# Patient Record
Sex: Male | Born: 1965 | Race: Black or African American | Hispanic: No | Marital: Married | Smoking: Never smoker
Health system: Southern US, Community
[De-identification: ages and names within clinical notes are randomized; demographics above are authoritative.]

## PROBLEM LIST (undated history)

## (undated) DIAGNOSIS — G4730 Sleep apnea, unspecified: Secondary | ICD-10-CM

## (undated) DIAGNOSIS — T7840XA Allergy, unspecified, initial encounter: Secondary | ICD-10-CM

## (undated) DIAGNOSIS — I739 Peripheral vascular disease, unspecified: Secondary | ICD-10-CM

## (undated) DIAGNOSIS — E785 Hyperlipidemia, unspecified: Secondary | ICD-10-CM

## (undated) DIAGNOSIS — I10 Essential (primary) hypertension: Secondary | ICD-10-CM

## (undated) DIAGNOSIS — I509 Heart failure, unspecified: Secondary | ICD-10-CM

## (undated) HISTORY — DX: Sleep apnea, unspecified: G47.30

## (undated) HISTORY — DX: Hyperlipidemia, unspecified: E78.5

## (undated) HISTORY — DX: Morbid (severe) obesity due to excess calories: E66.01

## (undated) HISTORY — PX: TONSILLECTOMY: SUR1361

## (undated) HISTORY — DX: Essential (primary) hypertension: I10

## (undated) HISTORY — DX: Allergy, unspecified, initial encounter: T78.40XA

---

## 2006-12-07 ENCOUNTER — Ambulatory Visit: Payer: Self-pay | Admitting: Internal Medicine

## 2006-12-07 LAB — CONVERTED CEMR LAB
ALT: 30 units/L (ref 0–53)
AST: 25 units/L (ref 0–37)
Albumin: 3.6 g/dL (ref 3.5–5.2)
Alkaline Phosphatase: 91 units/L (ref 39–117)
BUN: 10 mg/dL (ref 6–23)
Basophils Absolute: 0 10*3/uL (ref 0.0–0.1)
Basophils Relative: 0 % (ref 0.0–1.0)
Bilirubin, Direct: 0.1 mg/dL (ref 0.0–0.3)
CO2: 31 meq/L (ref 19–32)
Calcium: 9.6 mg/dL (ref 8.4–10.5)
Chloride: 100 meq/L (ref 96–112)
Cholesterol: 248 mg/dL (ref 0–200)
Creatinine, Ser: 0.8 mg/dL (ref 0.4–1.5)
Creatinine,U: 183 mg/dL
Direct LDL: 162.3 mg/dL
Eosinophils Absolute: 0.2 10*3/uL (ref 0.0–0.6)
Eosinophils Relative: 3 % (ref 0.0–5.0)
GFR calc Af Amer: 138 mL/min
GFR calc non Af Amer: 114 mL/min
Glucose, Bld: 326 mg/dL — ABNORMAL HIGH (ref 70–99)
HCT: 41.9 % (ref 39.0–52.0)
HDL: 44.2 mg/dL (ref >39.0)
Hemoglobin: 14.1 g/dL (ref 13.0–17.0)
Hgb A1c MFr Bld: 13.8 % — ABNORMAL HIGH (ref 4.6–6.0)
Lymphocytes Relative: 40 % (ref 12.0–46.0)
MCHC: 33.7 g/dL (ref 30.0–36.0)
MCV: 83.1 fL (ref 78.0–100.0)
Microalb Creat Ratio: 87.4 mg/g — ABNORMAL HIGH (ref 0.0–30.0)
Microalb, Ur: 16 mg/dL — ABNORMAL HIGH (ref 0.0–1.9)
Monocytes Absolute: 0.4 10*3/uL (ref 0.2–0.7)
Monocytes Relative: 6.3 % (ref 3.0–11.0)
Neutro Abs: 3.5 10*3/uL (ref 1.4–7.7)
Neutrophils Relative %: 50.7 % (ref 43.0–77.0)
Platelets: 262 10*3/uL (ref 150–400)
Potassium: 4.6 meq/L (ref 3.5–5.1)
RBC: 5.04 M/uL (ref 4.22–5.81)
RDW: 12.8 % (ref 11.5–14.6)
Sodium: 138 meq/L (ref 135–145)
TSH: 0.85 microintl units/mL (ref 0.35–5.50)
Total Bilirubin: 0.8 mg/dL (ref 0.3–1.2)
Total CHOL/HDL Ratio: 5.6
Total Protein: 7.9 g/dL (ref 6.0–8.3)
Triglycerides: 224 mg/dL (ref 0–149)
VLDL: 45 mg/dL — ABNORMAL HIGH (ref 0–40)
WBC: 6.8 10*3/uL (ref 4.5–10.5)

## 2006-12-13 ENCOUNTER — Ambulatory Visit: Payer: Self-pay | Admitting: Internal Medicine

## 2006-12-26 ENCOUNTER — Encounter: Payer: Self-pay | Admitting: Endocrinology

## 2006-12-26 ENCOUNTER — Ambulatory Visit: Payer: Self-pay | Admitting: Endocrinology

## 2006-12-26 DIAGNOSIS — I10 Essential (primary) hypertension: Secondary | ICD-10-CM | POA: Insufficient documentation

## 2006-12-26 DIAGNOSIS — E1165 Type 2 diabetes mellitus with hyperglycemia: Secondary | ICD-10-CM | POA: Insufficient documentation

## 2006-12-26 DIAGNOSIS — E785 Hyperlipidemia, unspecified: Secondary | ICD-10-CM | POA: Insufficient documentation

## 2006-12-26 DIAGNOSIS — E118 Type 2 diabetes mellitus with unspecified complications: Secondary | ICD-10-CM | POA: Insufficient documentation

## 2007-01-16 ENCOUNTER — Ambulatory Visit: Payer: Self-pay | Admitting: Internal Medicine

## 2007-01-16 LAB — CONVERTED CEMR LAB
ALT: 25 units/L (ref 0–53)
BUN: 7 mg/dL (ref 6–23)
CO2: 29 meq/L (ref 19–32)
Calcium: 9.3 mg/dL (ref 8.4–10.5)
Chloride: 106 meq/L (ref 96–112)
Cholesterol: 124 mg/dL (ref 0–200)
Creatinine, Ser: 0.8 mg/dL (ref 0.4–1.5)
GFR calc Af Amer: 138 mL/min
GFR calc non Af Amer: 114 mL/min
Glucose, Bld: 125 mg/dL — ABNORMAL HIGH (ref 70–99)
HDL: 37.5 mg/dL — ABNORMAL LOW (ref >39.0)
LDL Cholesterol: 70 mg/dL (ref 0–99)
Potassium: 4.4 meq/L (ref 3.5–5.1)
Sodium: 140 meq/L (ref 135–145)
T3 Uptake Ratio: 44.3 % — ABNORMAL HIGH (ref 22.5–37.0)
Total CHOL/HDL Ratio: 3.3
Triglycerides: 83 mg/dL (ref 0–149)
VLDL: 17 mg/dL (ref 0–40)

## 2007-01-24 ENCOUNTER — Ambulatory Visit: Payer: Self-pay | Admitting: Internal Medicine

## 2007-03-04 ENCOUNTER — Ambulatory Visit: Payer: Self-pay | Admitting: Internal Medicine

## 2007-03-04 LAB — CONVERTED CEMR LAB
Creatinine,U: 173.8 mg/dL
Hgb A1c MFr Bld: 7.4 % — ABNORMAL HIGH (ref 4.6–6.0)
Microalb Creat Ratio: 19.6 mg/g (ref 0.0–30.0)
Microalb, Ur: 3.4 mg/dL — ABNORMAL HIGH (ref 0.0–1.9)

## 2007-03-12 ENCOUNTER — Telehealth: Payer: Self-pay | Admitting: Internal Medicine

## 2007-03-14 ENCOUNTER — Encounter: Payer: Self-pay | Admitting: Internal Medicine

## 2007-03-19 ENCOUNTER — Encounter: Payer: Self-pay | Admitting: Internal Medicine

## 2008-08-14 ENCOUNTER — Ambulatory Visit: Payer: Self-pay | Admitting: Family Medicine

## 2008-11-11 ENCOUNTER — Ambulatory Visit: Payer: Self-pay | Admitting: Family Medicine

## 2008-11-11 DIAGNOSIS — E1169 Type 2 diabetes mellitus with other specified complication: Secondary | ICD-10-CM | POA: Insufficient documentation

## 2008-11-11 DIAGNOSIS — E119 Type 2 diabetes mellitus without complications: Secondary | ICD-10-CM | POA: Insufficient documentation

## 2008-11-11 DIAGNOSIS — R5381 Other malaise: Secondary | ICD-10-CM | POA: Insufficient documentation

## 2008-11-11 DIAGNOSIS — R5383 Other fatigue: Secondary | ICD-10-CM

## 2008-11-11 DIAGNOSIS — E118 Type 2 diabetes mellitus with unspecified complications: Secondary | ICD-10-CM | POA: Insufficient documentation

## 2008-11-11 LAB — CONVERTED CEMR LAB
ALT: 18 units/L (ref 0–53)
AST: 19 units/L (ref 0–37)
Albumin: 3.5 g/dL (ref 3.5–5.2)
Alkaline Phosphatase: 60 units/L (ref 39–117)
BUN: 12 mg/dL (ref 6–23)
Basophils Absolute: 0 10*3/uL (ref 0.0–0.1)
Basophils Relative: 0.6 % (ref 0.0–3.0)
Bilirubin, Direct: 0 mg/dL (ref 0.0–0.3)
CO2: 31 meq/L (ref 19–32)
Calcium: 9.2 mg/dL (ref 8.4–10.5)
Chloride: 105 meq/L (ref 96–112)
Cholesterol: 194 mg/dL (ref 0–200)
Creatinine, Ser: 1 mg/dL (ref 0.4–1.5)
Creatinine,U: 275.9 mg/dL
Eosinophils Absolute: 0.3 10*3/uL (ref 0.0–0.7)
Eosinophils Relative: 3.3 % (ref 0.0–5.0)
GFR calc non Af Amer: 105.64 mL/min (ref >60)
Glucose, Bld: 117 mg/dL — ABNORMAL HIGH (ref 70–99)
HCT: 38.1 % — ABNORMAL LOW (ref 39.0–52.0)
HDL: 42.5 mg/dL (ref >39.00)
Hemoglobin: 12.8 g/dL — ABNORMAL LOW (ref 13.0–17.0)
Hgb A1c MFr Bld: 7.9 % — ABNORMAL HIGH (ref 4.6–6.5)
LDL Cholesterol: 126 mg/dL — ABNORMAL HIGH (ref 0–99)
Lymphocytes Relative: 34.9 % (ref 12.0–46.0)
Lymphs Abs: 2.7 10*3/uL (ref 0.7–4.0)
MCHC: 33.7 g/dL (ref 30.0–36.0)
MCV: 87 fL (ref 78.0–100.0)
Microalb Creat Ratio: 10.5 mg/g (ref 0.0–30.0)
Microalb, Ur: 2.9 mg/dL — ABNORMAL HIGH (ref 0.0–1.9)
Monocytes Absolute: 0.6 10*3/uL (ref 0.1–1.0)
Monocytes Relative: 7.1 % (ref 3.0–12.0)
Neutro Abs: 4.2 10*3/uL (ref 1.4–7.7)
Neutrophils Relative %: 54.1 % (ref 43.0–77.0)
PSA: 0.88 ng/mL (ref 0.10–4.00)
Platelets: 239 10*3/uL (ref 150.0–400.0)
Potassium: 4.7 meq/L (ref 3.5–5.1)
RBC: 4.38 M/uL (ref 4.22–5.81)
RDW: 13.8 % (ref 11.5–14.6)
Sodium: 141 meq/L (ref 135–145)
TSH: 1.39 microintl units/mL (ref 0.35–5.50)
Total Bilirubin: 0.7 mg/dL (ref 0.3–1.2)
Total CHOL/HDL Ratio: 5
Total Protein: 7.7 g/dL (ref 6.0–8.3)
Triglycerides: 129 mg/dL (ref 0.0–149.0)
VLDL: 25.8 mg/dL (ref 0.0–40.0)
WBC: 7.8 10*3/uL (ref 4.5–10.5)

## 2008-11-19 ENCOUNTER — Ambulatory Visit: Payer: Self-pay | Admitting: Family Medicine

## 2008-11-19 LAB — CONVERTED CEMR LAB
Cholesterol, target level: 200 mg/dL
HDL goal, serum: 40 mg/dL
LDL Goal: 100 mg/dL

## 2010-04-13 ENCOUNTER — Ambulatory Visit
Admission: RE | Admit: 2010-04-13 | Discharge: 2010-04-13 | Payer: Self-pay | Source: Home / Self Care | Attending: Family Medicine | Admitting: Family Medicine

## 2010-04-13 DIAGNOSIS — J020 Streptococcal pharyngitis: Secondary | ICD-10-CM | POA: Insufficient documentation

## 2010-04-13 LAB — CONVERTED CEMR LAB: Rapid Strep: POSITIVE

## 2010-04-15 ENCOUNTER — Telehealth (INDEPENDENT_AMBULATORY_CARE_PROVIDER_SITE_OTHER): Payer: Self-pay | Admitting: *Deleted

## 2010-04-18 ENCOUNTER — Encounter (INDEPENDENT_AMBULATORY_CARE_PROVIDER_SITE_OTHER): Payer: Self-pay | Admitting: *Deleted

## 2010-04-18 ENCOUNTER — Telehealth: Payer: Self-pay | Admitting: Family Medicine

## 2010-05-11 NOTE — Miscellaneous (Signed)
Summary: Meformin/Simvastatn/Test Strips  Clinical Lists Changes  Medications: Changed medication from ZOCOR 40 MG  TABS (SIMVASTATIN) one by mouth at bedtime to ZOCOR 40 MG  TABS (SIMVASTATIN) one by mouth at bedtime - Signed Changed medication from GLUCOPHAGE XR 500 MG  TB24 (METFORMIN HCL) two by mouth two times a day to GLUCOPHAGE XR 500 MG  TB24 (METFORMIN HCL) two by mouth two times a day - Signed Changed medication from [facility] ULTRA TEST   STRP (GLUCOSE BLOOD) Test blood sugar three times a day as directed to [facility] ULTRA TEST   STRP (GLUCOSE BLOOD) test blood sugar once daily - Signed Rx of ZOCOR 40 MG  TABS (SIMVASTATIN) one by mouth at bedtime;  #90 x 4;  Signed;  Entered by: Glendell Docker;  Authorized by: Dondra Spry DO;  Method used: Telephoned to [facility], , , [place]  , Ph: , Fax: [phone] Rx of GLUCOPHAGE XR 500 MG  TB24 (METFORMIN HCL) two by mouth two times a day;  #360 x 4;  Signed;  Entered by: Glendell Docker;  Authorized by: Dondra Spry DO;  Method used: Telephoned to [facility], , , [place]  , Ph: , Fax: [phone] Rx of ONETOUCH ULTRA TEST   STRP (GLUCOSE BLOOD) test blood sugar once daily;  #3 x 4;  Signed;  Entered by: Glendell Docker;  Authorized by: Dondra Spry DO;  Method used: Telephoned to [facility], , , [place]  , Ph: , Fax: [phone]    Prescriptions: [place] TEST   STRP (GLUCOSE BLOOD) test blood sugar once daily  #3 x 4   Entered by:   Glendell Docker   Authorized by:   Dondra Spry DO   Signed by:   Glendell Docker on 03/19/2007   Method used:   Telephoned to ...       [facility]             , [place]         Ph:        Fax: [phone]   RxID:   [id number] GLUCOPHAGE XR 500 MG  TB24 (METFORMIN HCL) two by mouth two times a day  #360 x 4   Entered by:   Glendell Docker   Authorized by:   Dondra Spry DO   Signed by:   Glendell Docker on 03/19/2007   Method used:   Telephoned to ...       [facility]             , [place]         Ph:          Fax: [phone]   RxID:   [id number] ZOCOR 40 MG  TABS (SIMVASTATIN) one by mouth at bedtime  #90 x 4   Entered by:   Glendell Docker   Authorized by:   Dondra Spry DO   Signed by:   Glendell Docker on 03/19/2007   Method used:   Telephoned to ...       [facility]             , [place]         Ph:        Fax: [phone]   RxID:   [id number]    Current Allergies: No known allergies

## 2010-05-11 NOTE — Letter (Signed)
Summary: Unable To Reach-Consult Scheduled  [facility]  [address] [place], [place] [address]   Phone: [phone]  Fax: [phone]      03/14/2007     MRN: [id number]    Dear Mr. JANUSZEWSKI,   We have been unable to reach you by phone. This letter is in response to a message given to Dr. Debby Bud on 03/12/2007 regarding test strip authorization. Per Dr. Lewie Loron diabetes control is better. He advised that you should now only check it once daily before breakfast.    If you have any question please call us.      Thank you,    [facility]  Appended Document: Unable To Reach-Consult Scheduled    Clinical Lists Changes

## 2010-05-11 NOTE — Progress Notes (Signed)
Summary: test strip problem  Phone Note Call from Patient Call back at [facility] Phone [phone]   Summary of Call: Pt called, test strips require prior auth. Pt is not on insulin and insurance will not cover more than 1x daily for non insulin pt.  Initial call taken by: Lamar Sprinkles,  March 12, 2007 4:38 PM  Follow-up for Phone Call        Inform pt his diabetes control in better.  Check only once daily bef breakfast Follow-up by: Dondra Spry DO,  March 12, 2007 6:20 PM  Additional Follow-up for Phone Call Additional follow up Details #1::        pt's phone picks up and sounds like a fax tried x2 ..................................................................Marland KitchenLamar Sprinkles  March 12, 2007 6:34 PM   attempted call again, phone rings & picks up & sounds like a fax ..................................................................Marland KitchenLamar Sprinkles  March 13, 2007 8:25 AM     Additional Follow-up for Phone Call Additional follow up Details #2::    Called times three, phone rings and then turn into fax. Letter mailed  Follow-up by: Rock Nephew CMA,  March 14, 2007 4:21 PM

## 2010-05-11 NOTE — Assessment & Plan Note (Signed)
Summary: 3 M F/U DLO   Vital Signs:  Patient profile:   45 year old male Height:      70 inches Weight:      329.8 pounds BMI:     47.49 Temp:     98.6 degrees F oral Pulse rate:   80 / minute Pulse rhythm:   regular BP sitting:   150 / 90  (left arm) Cuff size:   large  Vitals Entered By: Benny Lennert CMA (November 19, 2008 3:40 PM)  History of Present Illness: Chief complaint 3 month follow up DM, morbidly obese  1. HTN: 128/80 on my recheck, has been normal on my exams  2. DM: has been altering his meds  115-130 with 2 tabs 1 pill taking 1 pill only  3. Lipids: LDL not at goal  Pneumovax Optometrist  Adipex -  phentermine   128/80  Diabetes Management History:      The patient is a 45 years old male who comes in for evaluation of DM Type 2.  He states understanding of dietary principles and is following his diet appropriately.  No sensory loss is reported.  Self foot exams are being performed.  He is checking home blood sugars.  He says that he is exercising.  Type of exercise includes: walking.  Duration of exercise is estimated to be 60 min.  He is doing this 6 times per week.        Hypoglycemic symptoms are not occurring.  No hyperglycemic symptoms are reported.        There are no symptoms to suggest diabetic complications.  The following changes have been made to his treatment plan since last visit: diet changes, medication changes, and exercise program.  Treatment plan changes were initiated by patient and initiated by MD.    Hypertension History:      He denies headache, chest pain, palpitations, dyspnea with exertion, orthopnea, PND, peripheral edema, visual symptoms, neurologic problems, syncope, and side effects from treatment.  He notes no problems with any antihypertensive medication side effects.        Positive major cardiovascular risk factors include diabetes, hyperlipidemia, and hypertension.  Negative major cardiovascular risk factors include male  age less than 70 years old, negative family history for ischemic heart disease, and non-tobacco-user status.        Further assessment for target organ damage reveals no history of ASHD, cardiac end-organ damage (CHF/LVH), stroke/TIA, peripheral vascular disease, renal insufficiency, or hypertensive retinopathy.    Lipid Management History:      Positive NCEP/ATP III risk factors include diabetes and hypertension.  Negative NCEP/ATP III risk factors include male age less than 45 years old, no family history for ischemic heart disease, non-tobacco-user status, no ASHD (atherosclerotic heart disease), no prior stroke/TIA, no peripheral vascular disease, and no history of aortic aneurysm.        The patient states that he knows about the "Therapeutic Lifestyle Change" diet.  Adjunctive measures started by the patient include aerobic exercise, fiber, limit alcohol consumpton, and weight reduction.  He expresses no side effects from his lipid-lowering medication.  The patient denies any symptoms to suggest myopathy or liver disease from his "statin" therapy.    Contraindications/Deferment of Procedures/Staging:    Treatment: Pneumovax    Contraindication: other     Test/Procedure: Pneumovax vaccine    Reason for deferment: patient declined    Preventive Screening-Counseling & Management  Caffeine-Diet-Exercise     Does Patient Exercise: yes  Type of exercise: walking     Exercise (avg: min/session): 60 min.     Times/week: 6  Clinical Review Panels:  Lipid Management   Cholesterol:  194 (11/11/2008)   LDL (bad choesterol):  126 (11/11/2008)   HDL (good cholesterol):  42.50 (11/11/2008)  Diabetes Management   HgBA1C:  7.9 (11/11/2008)   Creatinine:  1.0 (11/11/2008)  CBC   WBC:  7.8 (11/11/2008)   RBC:  4.38 (11/11/2008)   Hgb:  12.8 (11/11/2008)   Hct:  38.1 (11/11/2008)   Platelets:  239.0 (11/11/2008)   MCV  87.0 (11/11/2008)   MCHC  33.7 (11/11/2008)   RDW  13.8  (11/11/2008)   PMN:  54.1 (11/11/2008)   Lymphs:  34.9 (11/11/2008)   Monos:  7.1 (11/11/2008)   Eosinophils:  3.3 (11/11/2008)   Basophil:  0.6 (11/11/2008)  Complete Metabolic Panel   Glucose:  117 (11/11/2008)   Sodium:  141 (11/11/2008)   Potassium:  4.7 (11/11/2008)   Chloride:  105 (11/11/2008)   CO2:  31 (11/11/2008)   BUN:  12 (11/11/2008)   Creatinine:  1.0 (11/11/2008)   Albumin:  3.5 (11/11/2008)   Total Protein:  7.7 (11/11/2008)   Calcium:  9.2 (11/11/2008)   Total Bili:  0.7 (11/11/2008)   Alk Phos:  60 (11/11/2008)   SGPT (ALT):  18 (11/11/2008)   SGOT (AST):  19 (11/11/2008)   Allergies: No Known Drug Allergies  Past History:  Past medical, surgical, family and social histories (including risk factors) reviewed, and no changes noted (except as noted below).  Past Medical History: Reviewed history from 08/14/2008 and no changes required. Diabetes mellitus, type II Hyperlipidemia Hypertension Morbid Obesity, BMI 50  Past Surgical History: Reviewed history from 08/14/2008 and no changes required. neg  Family History: Reviewed history from 12/26/2006 and no changes required. Family History Diabetes 1st degree relative  Social History: Reviewed history from 03/04/2007 and no changes required. Married works as Teacher, music for [employer] Never Smoked Alcohol use-no Does Patient Exercise:  yes  Review of Systems       REVIEW OF SYSTEMS GEN: No acute illnesses, no fever, chills, sweats. CV: No chest pain or SOB GI: No noted N or V Otherwise, pertinent positives and negatives are noted in the HPI or as noted.   Physical Exam  General:  alert, normal appearance, and morbidly obese Head:  Normocephalic and atraumatic without obvious abnormalities. No apparent alopecia or balding. Ears:  no external deformities.   Nose:  no external deformity.   Mouth:  Oral mucosa and oropharynx without lesions or exudates.  Teeth in good repair. Neck:  No  deformities, masses, or tenderness noted. Lungs:  Normal respiratory effort, chest expands symmetrically. Lungs are clear to auscultation, no crackles or wheezes. Heart:  Normal rate and regular rhythm. S1 and S2 normal without gallop, murmur, click, rub or other extra sounds. Extremities:  No clubbing, cyanosis, edema, or deformity noted with normal full range of motion of all joints.   Neurologic:  alert & oriented X3 and gait normal.   Cervical Nodes:  No lymphadenopathy noted Psych:  Cognition and judgment appear intact. Alert and cooperative with normal attention span and concentration. No apparent delusions, illusions, hallucinations   Impression & Recommendations:  Problem # 1:  DM (ICD-250.00) Assessment Improved Altered his med regiment himself and was only taking 1 by mouth daily  His updated medication list for this problem includes:    Glucophage Xr 500 Mg Tb24 (Metformin hcl) .Marland KitchenMarland KitchenMarland KitchenMarland Kitchen  1 tablet two times a day    Lisinopril 10 Mg Tabs (Lisinopril) .[name]... 1 by mouth daily  Labs Reviewed: Creat: 1.0 (11/11/2008)    Reviewed HgBA1c results: 7.9 (11/11/2008)  7.4 (03/04/2007)  Problem # 2:  HYPERTENSION (ICD-401.9) Assessment: Improved  On recheck with appropriate sized cuff is normal  His updated medication list for this problem includes:    Lisinopril 10 Mg Tabs (Lisinopril) .[name]... 1 by mouth daily  BP today: 150/90 Prior BP: 140/90 (08/14/2008)  10 Yr Risk Heart Disease: 14 %  Labs Reviewed: K+: 4.7 (11/11/2008) Creat: : 1.0 (11/11/2008)   Chol: 194 (11/11/2008)   HDL: 42.50 (11/11/2008)   LDL: 126 (11/11/2008)   TG: 129.0 (11/11/2008)  Problem # 3:  HYPERLIPIDEMIA (ICD-272.4) Assessment: New Not at goal  His updated medication list for this problem includes:    Simvastatin 40 Mg Tabs (Simvastatin) .[name]... 1 by mouth at bedtime  Labs Reviewed: SGOT: 19 (11/11/2008)   SGPT: 18 (11/11/2008)  Lipid Goals: Chol Goal: 200 (11/19/2008)   HDL Goal: 40 (11/19/2008)    LDL Goal: 100 (11/19/2008)   TG Goal: 150 (11/19/2008)  10 Yr Risk Heart Disease: 14 %   HDL:42.50 (11/11/2008), 37.5 (01/16/2007)  LDL:126 (11/11/2008), 70 (16/01/9603)  Chol:194 (11/11/2008), 124 (01/16/2007)  Trig:129.0 (11/11/2008), 83 (01/16/2007)  Problem # 4:  OBESITY, MORBID (ICD-278.01) Losing weight  We discussed the risks and benefits including cardiac arrhythmia and death of phentermine prescription. He is currently getting from bariatric clinic, but now his co-pay is 100 dollars and cannot afford. We discussed risks and benefits and patient accepts increased risks of this medication use, which I think is OK for a short time in this patient with a BMI of 50.  Complete Medication List: 1)  Glucophage Xr 500 Mg Tb24 (Metformin hcl) .[name].. 1 tablet two times a day 2)  Onetouch Ultra Test Strp (Glucose blood) .... Test blood sugar three times a day 3)  Onetouch Lancets Misc (Lancets) .... Three times a day testing 4)  Adipex-p 37.5 Mg Caps (Phentermine hcl) .[name].. 1 by mouth daily 5)  Lisinopril 10 Mg Tabs (Lisinopril) .[name].. 1 by mouth daily 6)  Simvastatin 40 Mg Tabs (Simvastatin) .[name].. 1 by mouth at bedtime  Diabetes Management Assessment/Plan:      The following lipid goals have been established for the patient: Total cholesterol goal of 200; LDL cholesterol goal of 100; HDL cholesterol goal of 40; Triglyceride goal of 150.    Hypertension Assessment/Plan:      The patient's hypertensive risk group is category C: Target organ damage and/or diabetes.  His calculated 10 year risk of coronary heart disease is 14 %.  Today's blood pressure is 150/90.    Lipid Assessment/Plan:      The patient's calculated 10 year coronary heart disease risk is 14 %.  Based on NCEP/ATP III, the patient's risk factor category is "history of diabetes".  From this information, the patient's calculated lipid goals are as follows: Total cholesterol goal is 200; LDL cholesterol goal is 100; HDL cholesterol goal is  40; Triglyceride goal is 150.    Patient Instructions: 1)  f/u 3 months 2)  Hepatic Panel prior to visit ICD-9: v58.69 3)  HgBA1c prior to visit  ICD-9: 250.00 Prescriptions: ADIPEX-P 37.5 MG CAPS (PHENTERMINE HCL) 1 by mouth daily  #30 x 3   Entered and Authorized by:   Hannah Beat MD   Signed by:   Hannah Beat MD on 11/19/2008   Method used:  Print then Give to Patient   RxID:   [id number] SIMVASTATIN 40 MG TABS (SIMVASTATIN) 1 by mouth at bedtime  #30 x 11   Entered and Authorized by:   Hannah Beat MD   Signed by:   Hannah Beat MD on 11/19/2008   Method used:   Print then Give to Patient   RxID:   [id number]

## 2010-05-11 NOTE — Assessment & Plan Note (Signed)
Summary: NEW PT TO EST/CLE   Vital Signs:  Patient profile:   45 year old male Height:      70 inches Weight:      351.8 pounds BMI:     50.66 Temp:     98.5 degrees F oral Pulse rate:   80 / minute Pulse rhythm:   regular BP sitting:   140 / 90  (right arm) Cuff size:   large  Vitals Entered By: Benny Lennert CMA (Aug 14, 2008 1:54 PM)  History of Present Illness: Chief complaint new patient to be established   45 year old, morbidly obese male former patient of Dr. Artist Pais here to establish, no medical care in 1 year:  1. Morbid obesity: Went on phentermine, went to a weight loss clinic. this was at [facility], and he is doing remarkably well. Doing two a days. 1 1/2 - hours approximately total exercise time. He states his blood sugars have dropped from approximately 400 to now 150. He feels remarkably well, and he is not having polyuria or nocturia. no eating out.  Doing treadmill   DM: Sugar has dropped down to about 150 Had been running 350+ before now.  Now is sleepig and not getting up at night.  he actually ran out of his medications are almost run out, so he was only taking one metformin tablet a day.  HTN: recheck. he has been out of some of his hypertensive medications as well, so we will need to restart these    Allergies: No Known Drug Allergies  Past History:  Past medical, surgical, family and social histories (including risk factors) reviewed, and no changes noted (except as noted below).  Past Medical History:    Diabetes mellitus, type II    Hyperlipidemia    Hypertension    Morbid Obesity, BMI 50  Past Surgical History:    neg  Family History:    Reviewed history from 12/26/2006 and no changes required:       Family History Diabetes 1st degree relative  Social History:    Reviewed history from 03/04/2007 and no changes required:       Married       works as Teacher, music for [employer]       Never Smoked       Alcohol use-no  Review of  Systems       overall, no fever, chills, sweats, but he does have a weight loss of approximately 25 pounds in the last month. Patient has no chest pain, shortness of breath, significantly decreased polyuria, nocturia, and he feels very well.  Physical Exam  General:  alert, normal appearance, and overweight-appearing.   Head:  Normocephalic and atraumatic without obvious abnormalities. No apparent alopecia or balding. Ears:  External ear exam shows no significant lesions or deformities.  Otoscopic examination reveals clear canals, tympanic membranes are intact bilaterally without bulging, retraction, inflammation or discharge. Hearing is grossly normal bilaterally. Nose:  no external deformity.   Mouth:  Oral mucosa and oropharynx without lesions or exudates.  Teeth in good repair. Neck:  No deformities, masses, or tenderness noted. Lungs:  Normal respiratory effort, chest expands symmetrically. Lungs are clear to auscultation, no crackles or wheezes. Heart:  Normal rate and regular rhythm. S1 and S2 normal without gallop, murmur, click, rub or other extra sounds. Abdomen:  Bowel sounds positive,abdomen soft and non-tender without masses, organomegaly or hernias noted. morbidly obese Extremities:  No clubbing, cyanosis, edema, or deformity noted  with normal full range of motion of all joints.   Neurologic:  alert & oriented X3 and gait normal.   Cervical Nodes:  No lymphadenopathy noted Psych:  Cognition and judgment appear intact. Alert and cooperative with normal attention span and concentration. No apparent delusions, illusions, hallucinations   Impression & Recommendations:  Problem # 1:  DM W/RENAL MNFST, TYPE II, UNCONTROLLED (ICD-250.42) Assessment New Will have to start here and recheck - has not been checking sugars well, last a1c was 14, sounds like doing better.  check labs and titrate meds up  The following medications were removed from the medication list:    Zestril 10 Mg  Tabs (Lisinopril) ..... One by mouth once daily His updated medication list for this problem includes:    Glucophage Xr 500 Mg Tb24 (Metformin hcl) .[name]... 1 tablet two times a day    Lisinopril 10 Mg Tabs (Lisinopril) .[name]... 1 by mouth daily  Problem # 2:  OBESITY, MORBID (ICD-278.01) Assessment: New very excited about doing much better with a lot of decreased weight  Problem # 3:  HYPERTENSION (ICD-401.9) Assessment: Deteriorated not at goal, out of some meds. follow, for a goal 130/80 - he is going to check at home at the weight loss center and if over 130/80 will titrate up meds  The following medications were removed from the medication list:    Zestril 10 Mg Tabs (Lisinopril) ..... One by mouth once daily    Amlodipine Besylate 5 Mg Tabs (Amlodipine besylate) ..... One by mouth qd His updated medication list for this problem includes:    Lisinopril 10 Mg Tabs (Lisinopril) .[name]... 1 by mouth daily  Problem # 4:  HYPERLIPIDEMIA (ICD-272.4) check labs  The following medications were removed from the medication list:    Zocor 40 Mg Tabs (Simvastatin) ..... One by mouth at bedtime  Complete Medication List: 1)  Glucophage Xr 500 Mg Tb24 (Metformin hcl) .[name].. 1 tablet two times a day 2)  Onetouch Ultra Test Strp (Glucose blood) .... Test blood sugar three times a day 3)  Onetouch Lancets Misc (Lancets) .... Three times a day testing 4)  Adipex-p 37.5 Mg Caps (Phentermine hcl) .... Once a day 5)  Lisinopril 10 Mg Tabs (Lisinopril) .[name].. 1 by mouth daily  Patient Instructions: 1)  Please make a future lab appointment: Stop at the front desk on your way out to set it up. You can come in that day directly and will not have to see the doctor. 2)  future fasting lipid panel 3)  272.4 4)  Urine Microalbumin, 272.4 5)  HgBA1c, 250.00 6)  BMP: v77.1 7)  Hepatic Function Panel: v58.69 8)  CBC with diff: 780.79 9)  TSH: 780.79  10)  PSA, v 76.44 11)  follow-up 3  months Prescriptions: ONETOUCH ULTRA TEST   STRP (GLUCOSE BLOOD) test blood sugar three times a day  #100 x 11   Entered and Authorized by:   Hannah Beat MD   Signed by:   Hannah Beat MD on 08/14/2008   Method used:   Electronically to        [facility] [address] (retail)       [address], [address]       [place], [place]  [address]       Ph: [phone]       Fax: [phone]   RxID:   [id number] [facility] LANCETS   MISC (LANCETS) three  times a day testing  #100 x 5   Entered and Authorized by:   Hannah Beat MD   Signed by:   Hannah Beat MD on 08/14/2008   Method used:   Electronically to        [facility] [address] (retail)       [address], [address]       [place], [place]  [address]       Ph: [phone]       Fax: [phone]   RxID:   [id number] LISINOPRIL 10 MG TABS (LISINOPRIL) 1 by mouth daily  #30 x 5   Entered and Authorized by:   Hannah Beat MD   Signed by:   Hannah Beat MD on 08/14/2008   Method used:   Electronically to        [facility] [address] (retail)       [address]       [place], [place]  [address]       Ph: [phone]       Fax: [phone]   RxID:   [id number] GLUCOPHAGE XR 500 MG  TB24 (METFORMIN HCL) 1 tablet two times a day  #60 x 3   Entered and Authorized by:   Hannah Beat MD   Signed by:   Hannah Beat MD on 08/14/2008   Method used:   Electronically to        [facility] [address] (retail)       [address], [address]       [place], [place]  [address]       Ph: [phone]       Fax: [phone]   RxID:   [id number]

## 2010-05-11 NOTE — Assessment & Plan Note (Signed)
   Vital Signs:  Patient Profile:   45 Years Old Male Weight:      377 pounds Temp:     98.6 degrees F Pulse rate:   85 / minute BP sitting:   135 / 85                 Visit Type:  Consult Referred by:  Artist Pais PCP:  yoo  Chief Complaint:  dm.  History of Present Illness: pt states 2-yr h/o dm;  no known complications;  was not recently on med until dr Artist Pais rx'ed metformin-xr;  this, along with renewed dietary efforts, resulted in  improvement in cbg's to 100's; polyuria is much better;   he wants to continue same rx for now. dm nephropathy--states zestril causes sl dizziness chronic weight gain--much better past 14d with his new dietary efforts     Past Medical History:    Diabetes mellitus, type II    Hyperlipidemia    Hypertension   Family History:    Family History Diabetes 1st degree relative  Social History:    Married    works as Teacher, music for [employer]    Review of Systems       denies numbness and sob   Physical Exam  General:     appropriate dress, cooperative to examination, and overweight-appearing.   Mouth:     pharynx pink and moist.   Neck:     no thyromegaly.   Lungs:     normal respiratory effort and normal breath sounds.   Heart:     normal rate, regular rhythm, and no murmur.   Pulses:     R dorsalis pedis normal, R carotid normal, L dorsalis pedis normal, and L carotid normal.   Extremities:     no ulcer Neurologic:     sensation intact to light touch on feet.   Skin:     no edema.   Psych:     Oriented X3, not anxious appearing, and not depressed appearing.      Impression & Recommendations:  Problem # 1:  DM W/RENAL MNFST, TYPE II, UNCONTROLLED (ICD-250.42)  His updated medication list for this problem includes:    Zestril 10 Mg Tabs (Lisinopril) ..... Qd    Glucophage Xr 500 Mg Tb24 (Metformin hcl) ..... Bid  Labs Reviewed: HgBA1c: 13.8 (12/07/2006)   Creat: 0.8 (12/07/2006)      Problem # 2:  HYPERTENSION  (ICD-401.9)  His updated medication list for this problem includes:    Zestril 10 Mg Tabs (Lisinopril) ..... Qd   Problem # 3:  weight gain  Complete Medication List: 1)  Zocor 40 Mg Tabs (Simvastatin) .... Qhs 2)  Zestril 10 Mg Tabs (Lisinopril) .... Qd 3)  Glucophage Xr 500 Mg Tb24 (Metformin hcl) .... Bid   Patient Instructions: 1)  same meds 2)  You need to lose weight. Consider a lower calorie diet and regular exercise.  3)  ref dietician 4)  we discussed the risk of dm 5)  we discussed bariatric surg--declined for now 6)  Please schedule a follow-up appointment in 3 months.    ]

## 2010-05-11 NOTE — Assessment & Plan Note (Signed)
Summary: 3 WK FU-STC   Vital Signs:  Patient Profile:   45 Years Old Male Height:     70 inches Weight:      357.50 pounds BMI:     51.48 Temp:     97.2 degrees F oral Pulse rate:   82 / minute BP sitting:   142 / 96  (right arm)  Vitals Entered By: Glendell Docker (March 04, 2007 9:41 AM)                 Referred by:  Artist Pais PCP:  yoo  Chief Complaint:  Follow up blood sugar and Type 2 diabetes mellitus follow-up.  History of Present Illness:  Type 2 Diabetes Mellitus Follow-Up      This is a 45 year old man who presents for Type 2 diabetes mellitus follow-up.  The patient denies blurred vision and numbness of extremities.  The patient denies the following symptoms: chest pain and orthostatic symptoms.  Since the last visit the patient reports good dietary compliance.  The patient has been measuring capillary blood glucose before breakfast and after dinner.  Since the last visit, the patient reports having had eye care by an ophthalmologist.    Hypertension Follow-Up      The patient also presents for Hypertension follow-up.  The patient denies lightheadedness and edema.  The patient denies the following associated symptoms: dyspnea.  Compliance with medications (by patient report) has been near 100%.  The patient reports that dietary compliance has been good.  The patient reports exercising occasionally.    Current Allergies (reviewed today): No known allergies    Family History:    Reviewed history from 12/26/2006 and no changes required:       Family History Diabetes 1st degree relative  Social History:    Reviewed history from 12/26/2006 and no changes required:       Married       works as Teacher, music for [employer]       Never Smoked       Alcohol use-no   Risk Factors:  Tobacco use:  never Alcohol use:  no   Review of Systems      See HPI   Physical Exam  General:     alert, normal appearance, and overweight-appearing.   Neck:     No  deformities, masses, or tenderness noted. Lungs:     Normal respiratory effort, chest expands symmetrically. Lungs are clear to auscultation, no crackles or wheezes. Heart:     Normal rate and regular rhythm. S1 and S2 normal without gallop, murmur, click, rub or other extra sounds. Extremities:     no lower extremity edema. Neurologic:     No cranial nerve deficits noted. Station and gait are normal. Sensory, motor and coordinative functions appear intact. Psych:     Cognition and judgment appear intact. Alert and cooperative with normal attention span and concentration.     Impression & Recommendations:  Problem # 1:  DIABETES MELLITUS, TYPE II (ICD-250.00) Patient is making good progress with diabetes control.  His morning blood sugars are usually 120 to 130.  We will repeat his A1c today.  He will increase his metformin 2000 mg b.i.d.  He has lost approximately 20 to 30 pounds in the last 3 to 4 months. He was ready updated with influenza vaccine. His updated medication list for this problem includes:    Zestril 10 Mg Tabs (Lisinopril) ..... One by mouth once daily  Glucophage Xr 500 Mg Tb24 (Metformin hcl) .[name]..[name] Two by mouth two times a day  Orders: TLB-A1C / Hgb A1C (Glycohemoglobin) (83036-A1C) TLB-Microalbumin/Creat Ratio, Urine (82043-MALB)  Labs Reviewed: HgBA1c: 13.8 (12/07/2006)   Creat: 0.8 (01/16/2007)      Problem # 2:  HYPERTENSION (ICD-401.9) His blood pressure is suboptimally controlled.  We add amlodipine 5 mg in addition to Zestril.  Goal BP less than 130/80. His updated medication list for this problem includes:    Zestril 10 Mg Tabs (Lisinopril) ..... One by mouth once daily    Amlodipine Besylate 5 Mg Tabs (Amlodipine besylate) ..... One by mouth qd  BP today: 142/96 Prior BP: 135/85 (12/26/2006)  Labs Reviewed: Creat: 0.8 (01/16/2007) Chol: 124 (01/16/2007)   HDL: 37.5 (01/16/2007)   LDL: 70 (01/16/2007)   TG: 83 (01/16/2007)   Problem # 3:   HYPERLIPIDEMIA (ICD-272.4) Patient is tolerating simvastatin without difficulty.  His LDL is at goal. His updated medication list for this problem includes:    Zocor 40 Mg Tabs (Simvastatin) ..... One by mouth at bedtime  Labs Reviewed: Chol: 124 (01/16/2007)   HDL: 37.5 (01/16/2007)   LDL: 70 (01/16/2007)   TG: 83 (01/16/2007) SGOT: 25 (12/07/2006)   SGPT: 25 (01/16/2007)   Complete Medication List: 1)  Zocor 40 Mg Tabs (Simvastatin) .... One by mouth at bedtime 2)  Zestril 10 Mg Tabs (Lisinopril) .... One by mouth once daily 3)  Glucophage Xr 500 Mg Tb24 (Metformin hcl) .... Two by mouth two times a day 4)  Amlodipine Besylate 5 Mg Tabs (Amlodipine besylate) .... One by mouth qd 5)  Onetouch Ultra Test Strp (Glucose blood) .... Test blood sugar three times a day as directed 6)  Onetouch Lancets Misc (Lancets) .... Three times a day testing   Patient Instructions: 1)  Please schedule a follow-up appointment in 6 weeks.    Prescriptions: GLUCOPHAGE XR 500 MG  TB24 (METFORMIN HCL) two by mouth two times a day  #120 x 5   Entered and Authorized by:   Dondra Spry DO   Signed by:   Dondra Spry DO on 03/04/2007   Method used:   Electronically sent to ...       [facility]       [address], [address]       [place], [place]  [address]       Ph: [phone]       Fax: [phone]   RxID:   [id number] [facility] LANCETS   MISC (LANCETS) three times a day testing  #100 x 5   Entered and Authorized by:   Dondra Spry DO   Signed by:   Dondra Spry DO on 03/04/2007   Method used:   Electronically sent to ...       [facility]       [address]       [place], [place]  [address]       Ph: [phone]       Fax: [phone]   RxID:   [id number] [facility] ULTRA TEST   STRP (GLUCOSE BLOOD) Test blood sugar three times a day as directed  #100 x 5   Entered and Authorized by:   Dondra Spry DO    Signed by:   Dondra Spry DO on 03/04/2007   Method used:  Electronically sent to ...       [facility]       [address]       [place], [place]  [address]       Ph: [phone]       Fax: [phone]   RxID:   [id number] AMLODIPINE BESYLATE 5 MG  TABS (AMLODIPINE BESYLATE) one by mouth qd  #30 x 5   Entered and Authorized by:   Dondra Spry DO   Signed by:   Dondra Spry DO on 03/04/2007   Method used:   Electronically sent to ...       [facility]       [address]       [place], [place]  [address]       Ph: [phone]       Fax: [phone]   RxID:   [id number]  ]

## 2010-05-11 NOTE — Letter (Signed)
Summary: Unable To Reach-Consult Scheduled  [facility]  [address] [place], [place] [address]   Phone: [phone]  Fax: [phone]       03/14/2007   MRN: [id number]    Dear Mr. MASSENBURG,   We have been unable to reach you by phone.  This letter is in response to a message given to Dr. Artist Pais on 03/12/2007 regarding authorization for test strips. Per Dr. Artist Pais, your diabetes control is better. He advised that you should now only check it once a day before breakfast.  If you have any question please call us.     Thank you,    [facility]

## 2010-05-12 NOTE — Assessment & Plan Note (Signed)
Summary: 11 APT COUGHING, FLU SYSTEMS/RBH   Vital Signs:  Patient profile:   45 year old male Height:      70 inches Weight:      353.4 pounds BMI:     50.89 Temp:     97.6 degrees F oral Pulse rate:   80 / minute Pulse rhythm:   regular BP sitting:   140 / 100  (left arm) Cuff size:   large  Vitals Entered By: Benny Lennert CMA (AAMA) (April 13, 2010 11:02 AM)  History of Present Illness: Chief complaint ? flu but, has had symptoms for 64 week  45 year old male:  Last wed, started to feel bad.  Now left eye started to turn red, getting worse and now.  Throat hurting.  Fever has been as high -- felt really hot and felt pretty bad. sore and achy all over.  some cough.  not sure about sick contacts   Conjunctivitis: L eye, now has progressed to the R eye over the last few days.    Allergies (verified): No Known Drug Allergies  Past History:  Past medical, surgical, family and social histories (including risk factors) reviewed, and no changes noted (except as noted below).  Past Medical History: Reviewed history from 08/14/2008 and no changes required. Diabetes mellitus, type II Hyperlipidemia Hypertension Morbid Obesity, BMI 50  Past Surgical History: Reviewed history from 08/14/2008 and no changes required. neg  Family History: Reviewed history from 12/26/2006 and no changes required. Family History Diabetes 1st degree relative  Social History: Reviewed history from 03/04/2007 and no changes required. Married works as Teacher, music for [employer] Never Smoked Alcohol use-no  Review of Systems       REVIEW OF SYSTEMS GEN: Acute illness details above. CV: No chest pain or SOB GI: No noted N or V Otherwise, pertinent positives and negatives are noted in the HPI.   Physical Exam  General:  Well-developed,well-nourished,in no acute distress; alert,appropriate and cooperative throughout examination Head:  Normocephalic and atraumatic without  obvious abnormalities. No apparent alopecia or balding. Eyes:  B reddish sclera and conjunctiva Ears:  External ear exam shows no significant lesions or deformities.  Otoscopic examination reveals clear canals, tympanic membranes are intact bilaterally without bulging, retraction, inflammation or discharge. Hearing is grossly normal bilaterally. Nose:  External nasal examination shows no deformity or inflammation. Nasal mucosa are pink and moist without lesions or exudates. Mouth:  Oral mucosa and oropharynx without lesions or exudates.  Teeth in good repair. Neck:  mild ttp lad Lungs:  Normal respiratory effort, chest expands symmetrically. Lungs are clear to auscultation, no crackles or wheezes. Heart:  Normal rate and regular rhythm. S1 and S2 normal without gallop, murmur, click, rub or other extra sounds. Psych:  Cognition and judgment appear intact. Alert and cooperative with normal attention span and concentration. No apparent delusions, illusions, hallucinations   Impression & Recommendations:  Problem # 1:  STREP THROAT (ICD-034.0) Assessment New  His updated medication list for this problem includes:    Penicillin V Potassium 500 Mg Tabs (Penicillin v potassium) .[name]... 1 by mouth 3 times a day  Instructed to complete antibiotics and call if not improved in 48 hours.   Orders: Rapid Strep ([address])  Problem # 2:  CONJUNCTIVITIS (ICD-372.30) Assessment: New  His updated medication list for this problem includes:    Polytrim 10000-0.1 Unit/ml-% Soln (Polymyxin b-trimethoprim) .[name]... 1 gtt in affected eye qid x 7 days  Discussed treatment, and urged  patient to wash hands carefully after touching face.   Complete Medication List: 1)  Glucophage Xr 500 Mg Tb24 (Metformin hcl) .[name].. 1 tablet two times a day 2)  Onetouch Ultra Test Strp (Glucose blood) .... Test blood sugar three times a day 3)  Onetouch Lancets Misc (Lancets) .... Three times a day testing 4)  Adipex-p 37.5 Mg Caps  (Phentermine hcl) .[name].. 1 by mouth daily 5)  Lisinopril 10 Mg Tabs (Lisinopril) .[name].. 1 by mouth daily 6)  Simvastatin 40 Mg Tabs (Simvastatin) .[name].. 1 by mouth at bedtime 7)  Polytrim 10000-0.1 Unit/ml-% Soln (Polymyxin b-trimethoprim) .[name].. 1 gtt in affected eye qid x 7 days 8)  Penicillin V Potassium 500 Mg Tabs (Penicillin v potassium) .[name].. 1 by mouth 3 times a day Prescriptions: PENICILLIN V POTASSIUM 500 MG TABS (PENICILLIN V POTASSIUM) 1 by mouth 3 times a day  #30 x 0   Entered and Authorized by:   Hannah Beat MD   Signed by:   Hannah Beat MD on 04/13/2010   Method used:   [facility]        [id number]       [address]       [place], [place]  [address]       Ph: [phone] or [phone]       Fax: [phone]   RxID:   [id number] POLYTRIM 10000-0.1 UNIT/ML-%  SOLN (POLYMYXIN B-TRIMETHOPRIM) 1 gtt in affected eye qid x 7 days  #1 x 0   Entered and Authorized by:   Hannah Beat MD   Signed by:   Hannah Beat MD on 04/13/2010   Method used:   [facility]        [id number]       [address]       [place], [place]  [address]       Ph: [phone] or [phone]       Fax: [phone]   RxID:   [id number]    Orders Added: 1)  Rapid Strep [[address]] 2)  Est. Patient Level IV [[address]]    Current Allergies (reviewed today): No known allergies   Laboratory Results    Other Tests  Rapid Strep: positive  Kit Test Internal QC: Negative   (Normal Range: Negative)

## 2010-05-12 NOTE — Progress Notes (Signed)
Summary: not any better  Phone Note Call from Patient Call back at [phone]   Caller: Patient Call For: Hannah Beat MD Summary of Call: Pt has been taking amox for strep.  Wife says this is not helping- symptoms are not any better. Wife is asking that something else be called [facility]. Initial call taken by: Lowella Petties CMA, AAMA,  April 18, 2010 8:32 AM  Follow-up for Phone Call        he could have a superimposed viral infection.  pen vk 500 mg, 1 by mouth three times a day, 10 days take on an empty stomach Follow-up by: Hannah Beat MD,  April 18, 2010 8:48 AM  Additional Follow-up for Phone Call Additional follow up Details #1::        Patients wife wants to know if their is anything he can take or you can give him for his throat b/c it is still bothering him really bad.Consuello Masse CMA   Additional Follow-up by: Benny Lennert CMA Duncan Dull),  April 18, 2010 9:10 AM    Additional Follow-up for Phone Call Additional follow up Details #2::    d/c PCN change to Amox Magic mouthwash as needed for sore throat. Follow-up by: Hannah Beat MD,  April 18, 2010 9:44 AM  Additional Follow-up for Phone Call Additional follow up Details #3:: Details for Additional Follow-up Action Taken: Patients wife advised.Consuello Masse CMA   Additional Follow-up by: Benny Lennert CMA Duncan Dull),  April 18, 2010 10:13 AM  New/Updated Medications: AMOXICILLIN 875 MG TABS (AMOXICILLIN) 1 by mouth two times a day * MAGIC MOUTHWASH 1 tsp swish and gargle as needed sore throat (120 mL Lidocaine 2%, 60 cc maalox, 60 cc Benadryl susp) Prescriptions: MAGIC MOUTHWASH 1 tsp swish and gargle as needed sore throat (120 mL Lidocaine 2%, 60 cc maalox, 60 cc Benadryl susp)  #240 mL x 0   Entered by:   Benny Lennert CMA (AAMA)   Authorized by:   Hannah Beat MD   Signed by:   Benny Lennert CMA (AAMA) on 04/18/2010   Method used:   [facility] ...       [facility]       [address]       [place], [place]  [address]       Ph: [phone]       Fax: [phone]   RxID:   [id number] AMOXICILLIN 875 MG TABS (AMOXICILLIN) 1 by mouth two times a day  #20 x 0   Entered by:   Benny Lennert CMA (AAMA)   Authorized by:   Hannah Beat MD   Signed by:   Benny Lennert CMA (AAMA) on 04/18/2010   Method used:   [facility]ed to ...       [facility]       [address], [address]       [place], [place]  [address]       Ph: [phone]       Fax: [phone]   RxID:   [id number] MAGIC MOUTHWASH 1 tsp swish and gargle as needed sore throat (120 mL Lidocaine 2%, 60 cc maalox, 60 cc Benadryl susp)  #240 mL x 0   Entered and Authorized by:   Hannah Beat MD   Signed by:  Hannah Beat MD on 04/18/2010   Method used:   [facility]phoned to ...       [facility]       [address], [address]       [place], [place]  [address]       Ph: [phone]       Fax: [phone]   RxID:   [id number] AMOXICILLIN 875 MG TABS (AMOXICILLIN) 1 by mouth two times a day  #20 x 0   Entered and Authorized by:   Hannah Beat MD   Signed by:   Hannah Beat MD on 04/18/2010   Method used:   Telephoned to ...       [facility]       [address]       [place], [place]  [address]       Ph: [phone]       Fax: [phone]   RxID:   [id number]

## 2010-05-12 NOTE — Letter (Signed)
Summary: Out of Work  [facility]  [address] [place], [place] [address]   Phone: [phone]  Fax: [phone]    April 18, 2010   Employee:  Kayon Maclaren    To Whom It May Concern:   For Medical reasons, please excuse the above named employee from work for the following dates:  Start:   January 4th 2012    End:   January 8th 2012  If you need additional information, please feel free to contact our office.         Sincerely,  Hannah Beat MD

## 2010-05-12 NOTE — Progress Notes (Signed)
Summary: needs note for work  Phone Note Call from Patient Call back at [phone]   Caller: Patient Call For: Hannah Beat MD Summary of Call: Patient was seen on 1/4 and he is still not feeling better. he was unable to work yesterday and today. He is asking if he can get a note to excuse him for these two days. Please call patient when letter is ready.  Initial call taken by: Melody Comas,  April 15, 2010 4:34 PM  Follow-up for Phone Call        Please help arrange note, ok to stamp my signature. Follow-up by: Hannah Beat MD,  April 17, 2010 4:30 PM  Additional Follow-up for Phone Call Additional follow up Details #1::        Letter wrote and up front for patients wife to pick up Additional Follow-up by: Benny Lennert CMA Duncan Dull),  April 18, 2010 9:09 AM

## 2010-08-23 NOTE — Assessment & Plan Note (Signed)
[facility]                           PRIMARY CARE OFFICE NOTE   Austin Shaw, Austin Shaw                    MRN:          [id number]  DATE:12/07/2006                            DOB:          01-Jun-1965    HISTORY AND PHYSICAL   CHIEF COMPLAINT:  New patient to practice/uncontrolled type 2 diabetes.   HISTORY OF PRESENT ILLNESS:  The patient is a 45 year old African-  American male here to establish primary care.  He is moving from [place],  [place].  He was diagnosed with type 2 diabetes approximately 16  months ago, started on oral agents.  He has been fairly noncompliant  with diabetic diet and has not been checking his blood sugars on a  regular basis.  He was referred to a diabetic educator, but did not  followup.  His blood pressure was also elevated in the past and  antihypertensives were not started, but a low-salt diet was recommended.   CURRENT MEDICATIONS:  None.   ALLERGIES:  None.   SOCIAL HISTORY:  The patient is married, accompanied by his wife.  He  has 2 children ages 70, 80, and 39.  He currently is employed as an  Scientist, water quality for [employer].   FAMILY HISTORY:  Mother is age 30 and has hypertension.  Father at 44  has type 2 diabetes.  Denies family history of cancer, heart disease, or  stroke.   HABITS:  He does not drink or use tobacco.   REVIEW OF SYSTEMS:  No HEENT symptoms.  Denies any exertional chest  pain, shortness of breath.  Denies heartburn nausea or vomiting,  constipation, diarrhea.  When first diagnosed with diabetes, he had  polyuria and polydipsia.  This has since resolved.  Denies numbness and  tingling of his lower extremities.  Occasional symptoms of paresthesias  of his hands.  He has not had a diabetic eye exam.   PHYSICAL EXAM:  VITAL SIGNS:  Height is 5 feet 10 inches, weight is 382  pounds.  Temperature is 98.6, pulse 90, BP 153/99 in the left arm in the  seated position.  GENERAL:  The patient  is pleasant, morbidly obese 45 year old African-  American male in no apparent distress.  HEENT:  Normocephalic, atraumatic.  Pupils equal, round, and reactive to  light bilaterally.  Extraocular motility was intact.  The patient was  anicteric.  Conjunctivae within normal limits.  External auditory canals  and tympanic membranes clear bilaterally.  Hearing was grossly normal.  Oropharyngeal exam was unremarkable.  Normal dentition.  NECK:  Thick, but no carotid bruit.  No adenopathy or thyromegaly was  appreciated.  CHEST:  Normal respiratory effort.  Chest was clear to auscultation  bilaterally.  No rhonchi, rales, or wheezing.  CARDIOVASCULAR:  Regular rate and rhythm.  No significant murmurs, rubs,  or gallops appreciated.  ABDOMEN:  Quite obese, but nontender.  Positive bowel sounds.  No  organomegaly.  MUSCULOSKELETAL:  No cyanosis, clubbing, or edema.  The patient has  diminished, but palpable dorsalis pedis pulses bilaterally.  He had  intact sensation to monofilament and vibration.  SKIN:  Otherwise, warm and dry.  No cracks or fissures were noted of his  feet.  NEUROLOGIC:  Cranial nerves 2-12 grossly intact.  He was nonfocal.   Fasting blood sugar in the office was 274.   IMPRESSION/RECOMMENDATIONS:  1. Type 2 diabetes, uncontrolled with morbid obesity.  2. Elevated blood pressure without diagnosis of hypertension.  3. History of obstructive sleep apnea.  4. Health maintenance.   RECOMMENDATIONS:  1. We discussed the pathophysiology of type 2 diabetes.  He will be      referred to a diabetic educator for further teaching.  We went over      major components of a diabetic diet.  2. I am reluctant to start any oral agents without knowledge of his      previous kidney function, and the patient was sent today for      baseline labs.  3. Referred to [facility] for diabetic eye exam.  4. The patient is to take baby aspirin for primary prevention.  5.  Discussed the importance of risk factor management to avoid      macrovascular complications.  His lipid panel will be obtained.  6. Follow up in approximately 1 week.     [name], DO  Electronically Signed    RDY/MedQ  DD: 12/07/2006  DT: 12/08/2006  Job #: [id number]

## 2010-10-10 ENCOUNTER — Encounter: Payer: Self-pay | Admitting: Family Medicine

## 2010-10-13 ENCOUNTER — Encounter: Payer: Self-pay | Admitting: Family Medicine

## 2010-10-13 ENCOUNTER — Ambulatory Visit (INDEPENDENT_AMBULATORY_CARE_PROVIDER_SITE_OTHER): Payer: BC Managed Care – PPO | Admitting: Family Medicine

## 2010-10-13 VITALS — BP 130/90 | HR 88 | Temp 98.9°F | Ht 70.0 in | Wt 353.8 lb

## 2010-10-13 DIAGNOSIS — Z0000 Encounter for general adult medical examination without abnormal findings: Secondary | ICD-10-CM

## 2010-10-13 DIAGNOSIS — E785 Hyperlipidemia, unspecified: Secondary | ICD-10-CM

## 2010-10-13 DIAGNOSIS — E119 Type 2 diabetes mellitus without complications: Secondary | ICD-10-CM

## 2010-10-13 DIAGNOSIS — E1129 Type 2 diabetes mellitus with other diabetic kidney complication: Secondary | ICD-10-CM

## 2010-10-13 DIAGNOSIS — I10 Essential (primary) hypertension: Secondary | ICD-10-CM

## 2010-10-13 DIAGNOSIS — Z79899 Other long term (current) drug therapy: Secondary | ICD-10-CM

## 2010-10-13 DIAGNOSIS — Z125 Encounter for screening for malignant neoplasm of prostate: Secondary | ICD-10-CM

## 2010-10-13 DIAGNOSIS — E1165 Type 2 diabetes mellitus with hyperglycemia: Secondary | ICD-10-CM

## 2010-10-13 MED ORDER — PHENTERMINE HCL 37.5 MG PO TABS: 37.5000 mg | ORAL_TABLET | Freq: Every day | ORAL | Status: DC

## 2010-10-13 MED ORDER — METFORMIN HCL ER 500 MG PO TB24: 500.0000 mg | ORAL_TABLET | Freq: Every day | ORAL | Status: DC

## 2010-10-13 MED ORDER — SIMVASTATIN 40 MG PO TABS: 40.0000 mg | ORAL_TABLET | Freq: Every day | ORAL | Status: DC

## 2010-10-13 MED ORDER — ONETOUCH LANCETS MISC: Status: DC

## 2010-10-13 MED ORDER — LISINOPRIL 10 MG PO TABS: 10.0000 mg | ORAL_TABLET | Freq: Every day | ORAL | Status: DC

## 2010-10-13 MED ORDER — GLUCOSE BLOOD VI STRP: ORAL_STRIP | Status: DC

## 2010-10-13 NOTE — Progress Notes (Signed)
Austin Shaw, a 45 y.o. male presents today in the office for the following:  Health Maintenance exam and f/u multiple chronic disease states:  Preventative Health Maintenance Visit:  Health Maintenance Summary Reviewed and updated, unless pt declines services.  Tobacco History Reviewed. Alcohol: No concerns, no excessive use Exercise Habits: Some activity, rec at least 30 mins 5 times a week - not getting enough now STD concerns: no risk or activity to increase risk Drug Use: None Encouraged self-testicular check Labs needed  Out of all meds  Diabetes Mellitus: Tolerating Medications: not taking Compliance with diet: poor Exercise: minimal Avg blood sugars at home: 180-200 Foot problems: none Hypoglycemia: none No nausea, vomitting, blurred vision, polyuria. Upcoming eye exam  Lab Results  Component Value Date   HGBA1C 7.9* 11/11/2008    Basic Metabolic Panel:    Component Value Date/Time   NA 141 11/11/2008 0915   K 4.7 11/11/2008 0915   CL 105 11/11/2008 0915   CO2 31 11/11/2008 0915   BUN 12 11/11/2008 0915   CREATININE 1.0 11/11/2008 0915   GLUCOSE 117* 11/11/2008 0915   CALCIUM 9.2 11/11/2008 0915     2.  HTN:  Off of all medications No CP, no sob. No HA.  BP Readings from Last 3 Encounters:  10/13/10 130/90  04/13/10 140/100  11/19/08 150/90    Lipids: off of all medications Lipids: not at goal on last check, was tolerating his statin fine but ran out    Component Value Date/Time   CHOL 194 11/11/2008 0915   TRIG 129.0 11/11/2008 0915   HDL 42.50 11/11/2008 0915   LDLDIRECT 162.3 12/07/2006 1038   VLDL 25.8 11/11/2008 0915   CHOLHDL 5 11/11/2008 0915   4, Morbid obesity: BMI 50. Poor compliance with diet and exercise.  5. ED: dropped down to twice a week, less robust. Able to achieve an erection most of the time, but not always, will sometimes lose it. Wife has more vigor.  Patient Active Problem List  Diagnoses  . DM  . DM W/RENAL MNFST, TYPE II, UNCONTROLLED    . HYPERLIPIDEMIA  . OBESITY, MORBID  . HYPERTENSION  . OTHER MALAISE AND FATIGUE  . Special screening for malignant neoplasm of prostate  . Routine general medical examination at a health care facility   Past Medical History  Diagnosis Date  . Diabetes mellitus     type 2  . Hyperlipidemia   . Hypertension   . Morbid obesity     BMI 50   No past surgical history on file. History  Substance Use Topics  . Smoking status: Never Smoker   . Smokeless tobacco: Not on file  . Alcohol Use: No   Family History  Problem Relation Age of Onset  . Diabetes      fam hx 1st degree relative   No Known Allergies No current outpatient prescriptions on file prior to visit.   General: Denies fever, chills, sweats. No significant weight loss. Eyes: Denies blurring,significant itching ENT: Denies earache, sore throat, and hoarseness. Cardiovascular: Denies chest pains, palpitations, dyspnea on exertion Respiratory: Denies cough, dyspnea at rest,wheeezing Breast: no concerns about lumps GI: Denies nausea, vomiting, diarrhea, constipation, change in bowel habits, abdominal pain, melena, hematochezia GU: Denies penile discharge, + ED, no urinary flow / outflow problems. No STD concerns. Musculoskeletal: Denies back pain, joint pain Derm: Denies rash, itching Neuro: Denies  paresthesias, frequent falls, frequent headaches Psych: Denies depression, anxiety Endocrine: Denies cold intolerance, heat intolerance, polydipsia Heme: Denies  enlarged lymph nodes Allergy: No hayfever   Physical Exam  Blood pressure 130/90, pulse 88, temperature 98.9 F (37.2 C), temperature source Oral, height 5\' 10"  (1.778 m), weight 353 lb 12.8 oz (160.483 kg), SpO2 97.00%.  PE: GEN: well developed, well nourished, no acute distress. Morbid obesity. Eyes: conjunctiva and lids normal, PERRLA, EOMI ENT: TM clear, nares clear, oral exam WNL Neck: supple, no lymphadenopathy, no thyromegaly, no JVD Pulm: clear to  auscultation and percussion, respiratory effort normal CV: regular rate and rhythm, S1-S2, no murmur, rub or gallop, no bruits, peripheral pulses normal and symmetric, no cyanosis, clubbing, edema or varicosities Chest: no scars, masses, no gynecomastia   GI: soft, non-tender; no hepatosplenomegaly, masses; active bowel sounds all quadrants GU: no hernia, testicular mass, penile discharge, priapism or prostate enlargement Lymph: no cervical, axillary or inguinal adenopathy MSK: gait normal, muscle tone and strength WNL, no joint swelling, effusions, discoloration, crepitus  SKIN: clear, good turgor, color WNL, no rashes, lesions, or ulcerations Neuro: normal mental status, normal strength, sensation, and motion Psych: alert; oriented to person, place and time, normally interactive and not anxious or depressed in appearance.  Assessment and Plan: 1.  Health Maintenance: The patient's preventative maintenance and recommended screening tests for an annual wellness exam were reviewed in full today. Brought up to date unless services declined.  Counselled on the importance of diet, exercise, and its role in overall health and mortality. The patient's FH and SH was reviewed, including their home life, tobacco status, and drug and alcohol status.  Lose weight at all costs.  Restart all medications. Reluctant about pneumovax at this point  2. DM: restart meds, recheck 1 month, Optometry referral 3. HTN: restart meds, recheck 1 month 4. Lipids: restart meds, recheck in 1 month 5. Morbid obesity: Phentermine use. Discussed potential risks and benefits with the patient. With a BMI of 50, he would like to lose weight at all costs and has no interest in bariatric surgery. In this case, I think it is reasonable to use phentermine, which he used in the past with good success. We discussed that I would require him to come into the office every month for check-ups and weight checks.

## 2010-10-13 NOTE — Patient Instructions (Addendum)
Recheck 1 month  Labs a few days before

## 2010-10-16 DIAGNOSIS — Z0000 Encounter for general adult medical examination without abnormal findings: Secondary | ICD-10-CM | POA: Insufficient documentation

## 2010-10-18 ENCOUNTER — Telehealth: Payer: Self-pay | Admitting: *Deleted

## 2010-10-18 NOTE — Telephone Encounter (Signed)
Prior auth is needed for phentermine, form is on your desk. 

## 2010-10-19 NOTE — Telephone Encounter (Signed)
Prior auth given for phentermine, advised pharmacy.  Approvel letter placed on doctor's desk for signature and scanning.

## 2010-10-19 NOTE — Telephone Encounter (Signed)
Form faxed back to [employer]. 

## 2010-11-14 ENCOUNTER — Other Ambulatory Visit (INDEPENDENT_AMBULATORY_CARE_PROVIDER_SITE_OTHER): Payer: BC Managed Care – PPO | Admitting: Family Medicine

## 2010-11-14 DIAGNOSIS — Z125 Encounter for screening for malignant neoplasm of prostate: Secondary | ICD-10-CM

## 2010-11-14 DIAGNOSIS — E119 Type 2 diabetes mellitus without complications: Secondary | ICD-10-CM

## 2010-11-14 DIAGNOSIS — Z79899 Other long term (current) drug therapy: Secondary | ICD-10-CM

## 2010-11-14 DIAGNOSIS — E785 Hyperlipidemia, unspecified: Secondary | ICD-10-CM

## 2010-11-14 LAB — LIPID PANEL
Cholesterol: 217 mg/dL — ABNORMAL HIGH (ref 0–200)
HDL: 51.7 mg/dL (ref >39.00)
Total CHOL/HDL Ratio: 4
Triglycerides: 130 mg/dL (ref 0.0–149.0)
VLDL: 26 mg/dL (ref 0.0–40.0)

## 2010-11-14 LAB — CBC WITH DIFFERENTIAL/PLATELET
Basophils Absolute: 0 10*3/uL (ref 0.0–0.1)
Basophils Relative: 0.6 % (ref 0.0–3.0)
Eosinophils Absolute: 0.3 10*3/uL (ref 0.0–0.7)
Eosinophils Relative: 4.1 % (ref 0.0–5.0)
HCT: 42.9 % (ref 39.0–52.0)
Hemoglobin: 13.7 g/dL (ref 13.0–17.0)
Lymphocytes Relative: 44.7 % (ref 12.0–46.0)
Lymphs Abs: 3.3 10*3/uL (ref 0.7–4.0)
MCHC: 31.9 g/dL (ref 30.0–36.0)
MCV: 87.8 fl (ref 78.0–100.0)
Monocytes Absolute: 0.5 10*3/uL (ref 0.1–1.0)
Monocytes Relative: 7.4 % (ref 3.0–12.0)
Neutro Abs: 3.2 10*3/uL (ref 1.4–7.7)
Neutrophils Relative %: 43.2 % (ref 43.0–77.0)
Platelets: 243 10*3/uL (ref 150.0–400.0)
RBC: 4.88 Mil/uL (ref 4.22–5.81)
RDW: 13.6 % (ref 11.5–14.6)
WBC: 7.4 10*3/uL (ref 4.5–10.5)

## 2010-11-14 LAB — BASIC METABOLIC PANEL
BUN: 11 mg/dL (ref 6–23)
CO2: 26 mEq/L (ref 19–32)
Calcium: 9.1 mg/dL (ref 8.4–10.5)
Chloride: 97 mEq/L (ref 96–112)
Creatinine, Ser: 0.9 mg/dL (ref 0.4–1.5)
GFR: 119.69 mL/min (ref >60.00)
Glucose, Bld: 157 mg/dL — ABNORMAL HIGH (ref 70–99)
Potassium: 4.2 mEq/L (ref 3.5–5.1)
Sodium: 134 mEq/L — ABNORMAL LOW (ref 135–145)

## 2010-11-14 LAB — HEMOGLOBIN A1C: Hgb A1c MFr Bld: 11.9 % — ABNORMAL HIGH (ref 4.6–6.5)

## 2010-11-14 LAB — HEPATIC FUNCTION PANEL
ALT: 20 U/L (ref 0–53)
AST: 19 U/L (ref 0–37)
Albumin: 3.8 g/dL (ref 3.5–5.2)
Alkaline Phosphatase: 65 U/L (ref 39–117)
Bilirubin, Direct: 0.1 mg/dL (ref 0.0–0.3)
Total Bilirubin: 0.5 mg/dL (ref 0.3–1.2)
Total Protein: 7.5 g/dL (ref 6.0–8.3)

## 2010-11-14 LAB — LDL CHOLESTEROL, DIRECT: Direct LDL: 139.9 mg/dL

## 2010-11-14 LAB — PSA: PSA: 1.04 ng/mL (ref 0.10–4.00)

## 2010-11-14 NOTE — Progress Notes (Signed)
Addended by: Liane Comber C on: 11/14/2010 10:03 AM   Modules accepted: Orders

## 2010-11-17 ENCOUNTER — Ambulatory Visit (INDEPENDENT_AMBULATORY_CARE_PROVIDER_SITE_OTHER): Payer: BC Managed Care – PPO | Admitting: Family Medicine

## 2010-11-17 ENCOUNTER — Encounter: Payer: Self-pay | Admitting: Family Medicine

## 2010-11-17 DIAGNOSIS — E119 Type 2 diabetes mellitus without complications: Secondary | ICD-10-CM

## 2010-11-17 DIAGNOSIS — I10 Essential (primary) hypertension: Secondary | ICD-10-CM

## 2010-11-17 DIAGNOSIS — E785 Hyperlipidemia, unspecified: Secondary | ICD-10-CM

## 2010-11-17 MED ORDER — METFORMIN HCL ER 500 MG PO TB24: 1000.0000 mg | ORAL_TABLET | Freq: Every day | ORAL | Status: DC

## 2010-11-17 NOTE — Patient Instructions (Signed)
Recheck in 1 month  Check your sugar twice a week In the morning before breakfast and 2 hours after a meal

## 2010-11-17 NOTE — Progress Notes (Signed)
  Subjective:    Patient ID: Austin Shaw, male    DOB: May 29, 1965, 45 y.o.   MRN: [id number]  HPI  Eating a lot better, getting some better sleep.  Lowest has been 140 - 220 while on medication Walking some for exercise. In the evening time, walking.   Diabetes Mellitus: Tolerating Medications: Compliance with diet: good Exercise: walking most every day about an hour Foot problems: none Hypoglycemia: none No nausea, vomitting, blurred vision, polyuria.  Lab Results  Component Value Date   HGBA1C 11.9* 11/14/2010    Basic Metabolic Panel:    Component Value Date/Time   NA 134* 11/14/2010 1003   K 4.2 11/14/2010 1003   CL 97 11/14/2010 1003   CO2 26 11/14/2010 1003   BUN 11 11/14/2010 1003   CREATININE 0.9 11/14/2010 1003   GLUCOSE 157* 11/14/2010 1003   CALCIUM 9.1 11/14/2010 1003   HTN: Tolerating all medications without side effects Stable and at goal No CP, no sob. No HA.  BP Readings from Last 3 Encounters:  11/17/10 132/80  10/13/10 130/90  04/13/10 140/100   Morbid obesity: loss of 18 pounds, compliant. Taking phentermine without difficulty  Lipids: Doing well, stable. Tolerating meds fine with no SE. Panel reviewed with patient.  Lipids:    Component Value Date/Time   CHOL 217* 11/14/2010 1003   TRIG 130.0 11/14/2010 1003   HDL 51.70 11/14/2010 1003   LDLDIRECT 139.9 11/14/2010 1003   VLDL 26.0 11/14/2010 1003   CHOLHDL 4 11/14/2010 1003    Lab Results  Component Value Date   ALT 20 11/14/2010   AST 19 11/14/2010   ALKPHOS 65 11/14/2010   BILITOT 0.5 11/14/2010   The PMH, PSH, Social History, Family History, Medications, and allergies have been reviewed in [facility], and have been updated if relevant.  Review of Systems ROS: GEN: No acute illnesses, no fevers, chills. GI: No n/v/d, eating normally Pulm: No SOB Interactive and getting along well at home.  Otherwise, ROS is as per the HPI.     Objective:   Physical Exam   Physical Exam  Blood pressure 132/80, pulse 95,  temperature 98.1 F (36.7 C), temperature source Oral, height 5' 9.5" (1.765 m), weight 335 lb (151.955 kg), SpO2 98.00%.  GEN: WDWN, NAD, Non-toxic, A & O x 3 HEENT: Atraumatic, Normocephalic. Neck supple. No masses, No LAD. Ears and Nose: No external deformity. CV: RRR, No M/G/R. No JVD. No thrill. No extra heart sounds. PULM: CTA B, no wheezes, crackles, rhonchi. No retractions. No resp. distress. No accessory muscle use. EXTR: No c/c/e NEURO Normal gait.  PSYCH: Normally interactive. Conversant. Not depressed or anxious appearing.  Calm demeanor.        Assessment & Plan:   1. DM   2. HYPERTENSION   3. HYPERLIPIDEMIA   4. OBESITY, MORBID    Increase Metformin XR to 1000 mg a day Keep HTN and lipid meds the same for now Losing weight well

## 2010-12-21 ENCOUNTER — Encounter: Payer: Self-pay | Admitting: Family Medicine

## 2010-12-21 ENCOUNTER — Ambulatory Visit (INDEPENDENT_AMBULATORY_CARE_PROVIDER_SITE_OTHER): Payer: BC Managed Care – PPO | Admitting: Family Medicine

## 2010-12-21 DIAGNOSIS — E1129 Type 2 diabetes mellitus with other diabetic kidney complication: Secondary | ICD-10-CM

## 2010-12-21 DIAGNOSIS — E1165 Type 2 diabetes mellitus with hyperglycemia: Secondary | ICD-10-CM

## 2010-12-21 DIAGNOSIS — N529 Male erectile dysfunction, unspecified: Secondary | ICD-10-CM | POA: Insufficient documentation

## 2010-12-21 MED ORDER — SILDENAFIL CITRATE 100 MG PO TABS: ORAL_TABLET | ORAL | Status: DC

## 2010-12-21 MED ORDER — PHENTERMINE HCL 37.5 MG PO TABS: 37.5000 mg | ORAL_TABLET | Freq: Every day | ORAL | Status: DC

## 2010-12-21 NOTE — Patient Instructions (Signed)
F/u 4-6 weeks

## 2010-12-21 NOTE — Progress Notes (Signed)
  Subjective:    Patient ID: Austin Shaw, male    DOB: 1965/12/06, 45 y.o.   MRN: [id number]  HPI  Austin Shaw, a 45 y.o. male presents today in the office for the following:    Wt Readings from Last 3 Encounters:  12/21/10 327 lb 12.8 oz (148.689 kg)  11/17/10 335 lb (151.955 kg)  10/13/10 353 lb 12.8 oz (160.483 kg)   Continued weight loss.  BP at goal  DM: Best 132 Worst is about 148 in the last couple of weeks Doing a lot better with diet No med se No n/v/ visual se  Refill on phentermine Viagra  Review of Systems ROS: GEN: No acute illnesses, no fevers, chills. GI: No n/v/d, eating normally Pulm: No SOB Interactive and getting along well at home.  Otherwise, ROS is as per the HPI.     Objective:   Physical Exam   Physical Exam  Blood pressure 130/80, pulse 94, temperature 98.1 F (36.7 C), temperature source Oral, height 5\' 10"  (1.778 m), weight 327 lb 12.8 oz (148.689 kg), SpO2 98.00%.  GEN: WDWN, NAD, Non-toxic, A & O x 3 HEENT: Atraumatic, Normocephalic. Neck supple. No masses, No LAD. Ears and Nose: No external deformity. CV: RRR, No M/G/R. No JVD. No thrill. No extra heart sounds. PULM: CTA B, no wheezes, crackles, rhonchi. No retractions. No resp. distress. No accessory muscle use.  EXTR: No c/c/e NEURO Normal gait.  PSYCH: Normally interactive. Conversant. Not depressed or anxious appearing.  Calm demeanor.        Assessment & Plan:   1. Impotence  sildenafil (VIAGRA) 100 MG tablet  2. OBESITY, MORBID  phentermine (ADIPEX-P) 37.5 MG tablet  3. DM W/RENAL MNFST, TYPE II, UNCONTROLLED  Hemoglobin A1c    Doing well. Viagra 100 mg, 1/2 to 1 tablet 30 minutes before intercourse, #10, 11 refills Discussed its prn use

## 2011-01-12 ENCOUNTER — Telehealth: Payer: Self-pay | Admitting: *Deleted

## 2011-01-12 NOTE — Telephone Encounter (Signed)
Very good thought. The cheapest is Levitra at [facility] or [facility] at 9 dollars a pill  levitra 20 mg, 1/2 to 1 tab po 30 minutes before intercourse, disp #8, 5 refills

## 2011-01-12 NOTE — Telephone Encounter (Signed)
Isn't levitra less expensive at [facility]?

## 2011-01-12 NOTE — Telephone Encounter (Signed)
Call  Nothing is cheaper. Only 3 products -- all are brand and expensive

## 2011-01-12 NOTE — Telephone Encounter (Signed)
Pt states viagra is too costly and he is asking if he can be changed to something less expensive.  Uses [facility] [address].

## 2011-01-13 MED ORDER — VARDENAFIL HCL 20 MG PO TABS: 10.0000 mg | ORAL_TABLET | ORAL | Status: DC | PRN

## 2011-01-13 NOTE — Telephone Encounter (Signed)
Rx sent to pharmacy and patient advised

## 2011-01-19 ENCOUNTER — Other Ambulatory Visit (INDEPENDENT_AMBULATORY_CARE_PROVIDER_SITE_OTHER): Payer: BC Managed Care – PPO

## 2011-01-19 DIAGNOSIS — E119 Type 2 diabetes mellitus without complications: Secondary | ICD-10-CM

## 2011-01-19 DIAGNOSIS — Z79899 Other long term (current) drug therapy: Secondary | ICD-10-CM

## 2011-01-19 DIAGNOSIS — E785 Hyperlipidemia, unspecified: Secondary | ICD-10-CM

## 2011-01-19 DIAGNOSIS — E1129 Type 2 diabetes mellitus with other diabetic kidney complication: Secondary | ICD-10-CM

## 2011-01-19 LAB — LIPID PANEL
Cholesterol: 219 mg/dL — ABNORMAL HIGH (ref 0–200)
HDL: 44.2 mg/dL (ref >39.00)
Total CHOL/HDL Ratio: 5
Triglycerides: 137 mg/dL (ref 0.0–149.0)
VLDL: 27.4 mg/dL (ref 0.0–40.0)

## 2011-01-19 LAB — MICROALBUMIN / CREATININE URINE RATIO
Creatinine,U: 218 mg/dL
Microalb Creat Ratio: 1 mg/g (ref 0.0–30.0)
Microalb, Ur: 2.1 mg/dL — ABNORMAL HIGH (ref 0.0–1.9)

## 2011-01-19 LAB — HEPATIC FUNCTION PANEL
ALT: 16 U/L (ref 0–53)
AST: 19 U/L (ref 0–37)
Albumin: 3.6 g/dL (ref 3.5–5.2)
Alkaline Phosphatase: 59 U/L (ref 39–117)
Bilirubin, Direct: 0 mg/dL (ref 0.0–0.3)
Total Bilirubin: 0.5 mg/dL (ref 0.3–1.2)
Total Protein: 7.6 g/dL (ref 6.0–8.3)

## 2011-01-19 LAB — LDL CHOLESTEROL, DIRECT: Direct LDL: 155.4 mg/dL

## 2011-01-19 LAB — HEMOGLOBIN A1C: Hgb A1c MFr Bld: 8.6 % — ABNORMAL HIGH (ref 4.6–6.5)

## 2011-01-25 ENCOUNTER — Ambulatory Visit: Payer: BC Managed Care – PPO | Admitting: Family Medicine

## 2011-06-09 ENCOUNTER — Telehealth: Payer: Self-pay | Admitting: *Deleted

## 2011-06-09 NOTE — Telephone Encounter (Signed)
Received a call from patients spouse requesting that we call to get PA for Phentermine 37.5mg , she stated that [facility] pharmacy told her that PA was needed for this medication.  Called [facility] at [phone] rep will fax paper work to our office.

## 2011-06-12 NOTE — Telephone Encounter (Signed)
Denied. He will need routine follow-up to use this medication as i have discussed with him in the past. Please let the patient himself know.

## 2011-06-12 NOTE — Telephone Encounter (Signed)
Patient advised and will call back to schedule appt.

## 2011-06-12 NOTE — Telephone Encounter (Signed)
PA form in your IN box. 

## 2011-06-27 ENCOUNTER — Telehealth: Payer: Self-pay | Admitting: *Deleted

## 2011-06-27 NOTE — Telephone Encounter (Signed)
Received fax from [employer] stating that PA has not been approved because other information is needed.  Per Herbert Seta, Dr. Patsy Lager is not willing to complete PA process because patient hasn't been seen since 01/2011.  Called and spoke with a [employer] rep at [phone] and advised them as above.

## 2012-06-19 ENCOUNTER — Other Ambulatory Visit (INDEPENDENT_AMBULATORY_CARE_PROVIDER_SITE_OTHER): Payer: BC Managed Care – PPO

## 2012-06-19 DIAGNOSIS — E119 Type 2 diabetes mellitus without complications: Secondary | ICD-10-CM

## 2012-06-19 DIAGNOSIS — Z79899 Other long term (current) drug therapy: Secondary | ICD-10-CM

## 2012-06-19 DIAGNOSIS — Z125 Encounter for screening for malignant neoplasm of prostate: Secondary | ICD-10-CM

## 2012-06-19 DIAGNOSIS — E785 Hyperlipidemia, unspecified: Secondary | ICD-10-CM

## 2012-06-19 LAB — LIPID PANEL
Cholesterol: 191 mg/dL (ref 0–200)
HDL: 41.7 mg/dL (ref >39.00)
LDL Cholesterol: 125 mg/dL — ABNORMAL HIGH (ref 0–99)
Total CHOL/HDL Ratio: 5
Triglycerides: 121 mg/dL (ref 0.0–149.0)
VLDL: 24.2 mg/dL (ref 0.0–40.0)

## 2012-06-19 LAB — CBC WITH DIFFERENTIAL/PLATELET
Basophils Absolute: 0 10*3/uL (ref 0.0–0.1)
Basophils Relative: 0.6 % (ref 0.0–3.0)
Eosinophils Absolute: 0.3 10*3/uL (ref 0.0–0.7)
Eosinophils Relative: 4.4 % (ref 0.0–5.0)
HCT: 41 % (ref 39.0–52.0)
Hemoglobin: 13.4 g/dL (ref 13.0–17.0)
Lymphocytes Relative: 30.2 % (ref 12.0–46.0)
Lymphs Abs: 2.2 10*3/uL (ref 0.7–4.0)
MCHC: 32.7 g/dL (ref 30.0–36.0)
MCV: 86.2 fl (ref 78.0–100.0)
Monocytes Absolute: 0.5 10*3/uL (ref 0.1–1.0)
Monocytes Relative: 7.4 % (ref 3.0–12.0)
Neutro Abs: 4.2 10*3/uL (ref 1.4–7.7)
Neutrophils Relative %: 57.4 % (ref 43.0–77.0)
Platelets: 240 10*3/uL (ref 150.0–400.0)
RBC: 4.76 Mil/uL (ref 4.22–5.81)
RDW: 14.2 % (ref 11.5–14.6)
WBC: 7.3 10*3/uL (ref 4.5–10.5)

## 2012-06-19 LAB — BASIC METABOLIC PANEL
BUN: 9 mg/dL (ref 6–23)
CO2: 30 mEq/L (ref 19–32)
Calcium: 9.2 mg/dL (ref 8.4–10.5)
Chloride: 102 mEq/L (ref 96–112)
Creatinine, Ser: 0.8 mg/dL (ref 0.4–1.5)
GFR: 134.37 mL/min (ref >60.00)
Glucose, Bld: 150 mg/dL — ABNORMAL HIGH (ref 70–99)
Potassium: 4.4 mEq/L (ref 3.5–5.1)
Sodium: 137 mEq/L (ref 135–145)

## 2012-06-19 LAB — MICROALBUMIN / CREATININE URINE RATIO
Creatinine,U: 208.5 mg/dL
Microalb Creat Ratio: 1.4 mg/g (ref 0.0–30.0)
Microalb, Ur: 2.9 mg/dL — ABNORMAL HIGH (ref 0.0–1.9)

## 2012-06-19 LAB — HEPATIC FUNCTION PANEL
ALT: 24 U/L (ref 0–53)
AST: 21 U/L (ref 0–37)
Albumin: 3.6 g/dL (ref 3.5–5.2)
Alkaline Phosphatase: 56 U/L (ref 39–117)
Bilirubin, Direct: 0.1 mg/dL (ref 0.0–0.3)
Total Bilirubin: 0.8 mg/dL (ref 0.3–1.2)
Total Protein: 7.1 g/dL (ref 6.0–8.3)

## 2012-06-19 LAB — PSA: PSA: 0.82 ng/mL (ref 0.10–4.00)

## 2012-06-19 LAB — HEMOGLOBIN A1C: Hgb A1c MFr Bld: 10.6 % — ABNORMAL HIGH (ref 4.6–6.5)

## 2012-06-24 ENCOUNTER — Ambulatory Visit (INDEPENDENT_AMBULATORY_CARE_PROVIDER_SITE_OTHER): Payer: BC Managed Care – PPO | Admitting: Family Medicine

## 2012-06-24 ENCOUNTER — Encounter: Payer: Self-pay | Admitting: Family Medicine

## 2012-06-24 VITALS — BP 140/92 | HR 97 | Temp 98.5°F | Ht 70.0 in | Wt 337.2 lb

## 2012-06-24 DIAGNOSIS — E785 Hyperlipidemia, unspecified: Secondary | ICD-10-CM

## 2012-06-24 DIAGNOSIS — E1129 Type 2 diabetes mellitus with other diabetic kidney complication: Secondary | ICD-10-CM

## 2012-06-24 DIAGNOSIS — I10 Essential (primary) hypertension: Secondary | ICD-10-CM

## 2012-06-24 DIAGNOSIS — Z0000 Encounter for general adult medical examination without abnormal findings: Secondary | ICD-10-CM

## 2012-06-24 MED ORDER — LISINOPRIL 10 MG PO TABS: 10.0000 mg | ORAL_TABLET | Freq: Every day | ORAL | Status: DC

## 2012-06-24 MED ORDER — METFORMIN HCL ER 500 MG PO TB24: 1000.0000 mg | ORAL_TABLET | Freq: Every day | ORAL | Status: DC

## 2012-06-24 NOTE — Progress Notes (Signed)
[facility] [address] [place] [address] Phone: [phone] Fax: [phone]  Date:  06/24/2012   Name:  Austin Shaw   DOB:  01/01/1966   MRN:  [id number] Gender: male Age: 47 y.o.  Primary Physician:  Hannah Beat, MD  Evaluating MD: Hannah Beat, MD   Chief Complaint: Annual Exam   History of Present Illness:  Austin Shaw is a 47 y.o. pleasant patient who presents with the following:  CPX and f/u chronic disease states: morbidly obese gentleman who I have not seen in > 1 year. Off all meds > 3 mo.  Wt Readings from Last 3 Encounters:  06/24/12 337 lb 4 oz (152.976 kg)  12/21/10 327 lb 12.8 oz (148.689 kg)  11/17/10 335 lb (151.955 kg)   Walking the last 2-3 weeks has used a pedometer.   Diabetes Mellitus: Tolerating Medications: not taking Compliance with diet: recently better, but previously poor Exercise: previously poor, now exercising more Avg blood sugars at home: not checking Foot problems: none Hypoglycemia: none No nausea, vomitting, blurred vision, polyuria.  Lab Results  Component Value Date   HGBA1C 10.6* 06/19/2012    Wt Readings from Last 3 Encounters:  06/24/12 337 lb 4 oz (152.976 kg)  12/21/10 327 lb 12.8 oz (148.689 kg)  11/17/10 335 lb (151.955 kg)    Body mass index is 48.39 kg/(m^2).   HTN: Not taking meds No CP, no sob. No HA.  BP Readings from Last 3 Encounters:  06/24/12 140/92  12/21/10 130/80  11/17/10 132/80    Basic Metabolic Panel:    Component Value Date/Time   NA 137 06/19/2012 1048   K 4.4 06/19/2012 1048   CL 102 06/19/2012 1048   CO2 30 06/19/2012 1048   BUN 9 06/19/2012 1048   CREATININE 0.8 06/19/2012 1048   GLUCOSE 150* 06/19/2012 1048   CALCIUM 9.2 06/19/2012 1048     Lipids: Off all meds Panel reviewed with patient.  Lipids:    Component Value Date/Time   CHOL 191 06/19/2012 1048   TRIG 121.0 06/19/2012 1048   HDL 41.70 06/19/2012 1048   LDLDIRECT 155.4  01/19/2011 0908   VLDL 24.2 06/19/2012 1048   CHOLHDL 5 06/19/2012 1048    Lab Results  Component Value Date   ALT 24 06/19/2012   AST 21 06/19/2012   ALKPHOS 56 06/19/2012   BILITOT 0.8 06/19/2012   Preventative Health Maintenance Visit:  Health Maintenance Summary Reviewed and updated, unless pt declines services.  Tobacco History Reviewed. Alcohol: No concerns, no excessive use Exercise Habits: rare STD concerns: no risk or activity to increase risk Drug Use: None Encouraged self-testicular check  Health Maintenance  Topic Date Due  . Influenza Vaccine  12/10/1967  . Pneumococcal Polysaccharide Vaccine (#1) 05/10/1969  . Tetanus/tdap  05/10/1986  . Foot Exam  10/13/2011  . Ophthalmology Exam  10/13/2011  . Hemoglobin A1c  12/20/2012  . Urine Microalbumin  06/19/2013    Labs reviewed with the patient.  Results for orders placed in visit on 06/19/12  LIPID PANEL      Result Value Range   Cholesterol 191  0 - 200 mg/dL   Triglycerides 865.7  0.0 - 149.0 mg/dL   HDL 84.69  >62.95 mg/dL   VLDL 28.4  0.0 - 13.2 mg/dL   LDL Cholesterol 440 (*) 0 - 99 mg/dL   Total CHOL/HDL Ratio 5    HEMOGLOBIN N0U      Result Value Range  Hemoglobin A1C 10.6 (*) 4.6 - 6.5 %  MICROALBUMIN / CREATININE URINE RATIO      Result Value Range   Microalb, Ur 2.9 (*) 0.0 - 1.9 mg/dL   Creatinine,U 409.8     Microalb Creat Ratio 1.4  0.0 - 30.0 mg/g  CBC WITH DIFFERENTIAL      Result Value Range   WBC 7.3  4.5 - 10.5 K/uL   RBC 4.76  4.22 - 5.81 Mil/uL   Hemoglobin 13.4  13.0 - 17.0 g/dL   HCT 11.9  14.7 - 82.9 %   MCV 86.2  78.0 - 100.0 fl   MCHC 32.7  30.0 - 36.0 g/dL   RDW 56.2  13.0 - 86.5 %   Platelets 240.0  150.0 - 400.0 K/uL   Neutrophils Relative 57.4  43.0 - 77.0 %   Lymphocytes Relative 30.2  12.0 - 46.0 %   Monocytes Relative 7.4  3.0 - 12.0 %   Eosinophils Relative 4.4  0.0 - 5.0 %   Basophils Relative 0.6  0.0 - 3.0 %   Neutro Abs 4.2  1.4 - 7.7 K/uL   Lymphs Abs 2.2   0.7 - 4.0 K/uL   Monocytes Absolute 0.5  0.1 - 1.0 K/uL   Eosinophils Absolute 0.3  0.0 - 0.7 K/uL   Basophils Absolute 0.0  0.0 - 0.1 K/uL  BASIC METABOLIC PANEL      Result Value Range   Sodium 137  135 - 145 mEq/L   Potassium 4.4  3.5 - 5.1 mEq/L   Chloride 102  96 - 112 mEq/L   CO2 30  19 - 32 mEq/L   Glucose, Bld 150 (*) 70 - 99 mg/dL   BUN 9  6 - 23 mg/dL   Creatinine, Ser 0.8  0.4 - 1.5 mg/dL   Calcium 9.2  8.4 - 78.4 mg/dL   GFR 696.29  >52.84 mL/min  HEPATIC FUNCTION PANEL      Result Value Range   Total Bilirubin 0.8  0.3 - 1.2 mg/dL   Bilirubin, Direct 0.1  0.0 - 0.3 mg/dL   Alkaline Phosphatase 56  39 - 117 U/L   AST 21  0 - 37 U/L   ALT 24  0 - 53 U/L   Total Protein 7.1  6.0 - 8.3 g/dL   Albumin 3.6  3.5 - 5.2 g/dL  PSA      Result Value Range   PSA 0.82  0.10 - 4.00 ng/mL     Patient Active Problem List  Diagnosis  . DM W/RENAL MNFST, TYPE II, UNCONTROLLED  . HYPERLIPIDEMIA  . OBESITY, MORBID  . HYPERTENSION  . OTHER MALAISE AND FATIGUE  . Special screening for malignant neoplasm of prostate  . Routine general medical examination at a health care facility  . Impotence    Past Medical History  Diagnosis Date  . Diabetes mellitus     type 2  . Hyperlipidemia   . Hypertension   . Morbid obesity     BMI 50    No past surgical history on file.  History   Social History  . Marital Status: Married    Spouse Name: N/A    Number of Children: N/A  . 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 Smoking status: Never Smoker   . Smokeless tobacco: Not on file  . Alcohol Use: No  . Drug Use: Not on file  .  Sexually Active: Not on file   Other Topics Concern  . Not on file   Social History Narrative  . No narrative on file    Family History  Problem Relation Age of Onset  . Diabetes      fam hx 1st degree relative    No Known Allergies  Medication list has been reviewed  and updated.  Outpatient Prescriptions Prior to Visit  Medication Sig Dispense Refill  . glucose blood test strip Use as instructed to test blood sugar three times daily  100 each  11  . lisinopril (PRINIVIL,ZESTRIL) 10 MG tablet Take 1 tablet (10 mg total) by mouth daily.  30 tablet  11  . metFORMIN (GLUCOPHAGE-XR) 500 MG 24 hr tablet Take 2 tablets (1,000 mg total) by mouth daily with breakfast.  60 tablet  11  . ONE TOUCH LANCETS MISC Use to test blood sugar three times a day (PLEASE GENERIC to fit his machine)  90 each  11  . phentermine (ADIPEX-P) 37.5 MG tablet Take 1 tablet (37.5 mg total) by mouth daily before breakfast.  30 tablet  3  . simvastatin (ZOCOR) 40 MG tablet Take 1 tablet (40 mg total) by mouth at bedtime.  30 tablet  11  . vardenafil (LEVITRA) 20 MG tablet Take 0.5 tablets (10 mg total) by mouth as needed for erectile dysfunction.  8 tablet  5   No facility-administered medications prior to visit.    Review of Systems:   General: Denies fever, chills, sweats. No significant weight loss. Eyes: Denies blurring,significant itching ENT: Denies earache, sore throat, and hoarseness. Cardiovascular: Denies chest pains, palpitations, dyspnea on exertion Respiratory: Denies cough, dyspnea at rest,wheeezing Breast: no concerns about lumps GI: Denies nausea, vomiting, diarrhea, constipation, change in bowel habits, abdominal pain, melena, hematochezia GU: Denies penile discharge, ED, urinary flow / outflow problems. No STD concerns. Musculoskeletal: Denies back pain, joint pain Derm: Denies rash, itching Neuro: Denies  paresthesias, frequent falls, frequent headaches Psych: Denies depression, anxiety Endocrine: Denies cold intolerance, heat intolerance, polydipsia Heme: Denies enlarged lymph nodes Allergy: No hayfever  Physical Examination: BP 140/92  Pulse 97  Temp(Src) 98.5 F (36.9 C) (Oral)  Ht 5\' 10"  (1.778 m)  Wt 337 lb 4 oz (152.976 kg)  BMI 48.39 kg/m2   SpO2 98%  Ideal Body Weight: Weight in (lb) to have BMI = 25: 173.9  GEN: well developed, well nourished, no acute distress Eyes: conjunctiva and lids normal, PERRLA, EOMI ENT: TM clear, nares clear, oral exam WNL Neck: supple, no lymphadenopathy, no thyromegaly, no JVD Pulm: clear to auscultation and percussion, respiratory effort normal CV: regular rate and rhythm, S1-S2, no murmur, rub or gallop, no bruits, peripheral pulses normal and symmetric, no cyanosis, clubbing, edema or varicosities GI: soft, non-tender; no hepatosplenomegaly, masses; active bowel sounds all quadrants GU: no hernia, testicular mass, penile discharge Lymph: no cervical, axillary or inguinal adenopathy MSK: gait normal, muscle tone and strength WNL, no joint swelling, effusions, discoloration, crepitus  SKIN: clear, good turgor, color WNL, no rashes, lesions, or ulcerations Neuro: normal mental status, normal strength, sensation, and motion Psych: alert; oriented to person, place and time, normally interactive and not anxious or depressed in appearance.  Assessment and Plan:  Routine general medical examination at a health care facility: The patient's preventative maintenance and recommended screening tests for an annual wellness exam were reviewed in full today. Brought up to date unless services declined.  Counselled on the importance of diet, exercise, and its role  in overall health and mortality. The patient's FH and SH was reviewed, including their home life, tobacco status, and drug and alcohol status.   DM W/RENAL MNFST, TYPE II, UNCONTROLLED: uncontrolled, a1c 11, start meds, diet, exercise, recheck 2-3 mo  HYPERTENSION: uncontrolled, off meds, restart ace, diet, exercise  HYPERLIPIDEMIA: not at goal, reviewed JNC8 goals.  OBESITY, MORBID: lose weight at all costs  >15 minutes spent in face to face time with patient, >50% spent in counselling or coordination of care: on probs 2-4 as above time  above pure health maintenance exam. Counselling, restarting medications, diet support.  Orders Today:  No orders of the defined types were placed in this encounter.    Updated Medication List: (Includes new medications, updates to list, dose adjustments) Meds ordered this encounter  Medications  . DISCONTD: lisinopril (PRINIVIL,ZESTRIL) 20 MG tablet    Sig:   . aspirin EC 81 MG tablet    Sig:   . lisinopril (PRINIVIL,ZESTRIL) 10 MG tablet    Sig: Take 1 tablet (10 mg total) by mouth daily.    Dispense:  30 tablet    Refill:  5  . metFORMIN (GLUCOPHAGE-XR) 500 MG 24 hr tablet    Sig: Take 2 tablets (1,000 mg total) by mouth daily with breakfast.    Dispense:  60 tablet    Refill:  5    Medications Discontinued: Medications Discontinued During This Encounter  Medication Reason  . lisinopril (PRINIVIL,ZESTRIL) 20 MG tablet Error  . phentermine (ADIPEX-P) 37.5 MG tablet Error  . simvastatin (ZOCOR) 40 MG tablet Error  . vardenafil (LEVITRA) 20 MG tablet Error  . lisinopril (PRINIVIL,ZESTRIL) 10 MG tablet Reorder  . metFORMIN (GLUCOPHAGE-XR) 500 MG 24 hr tablet Reorder      Signed, Karleen Hampshire T. Copland, MD 06/24/2012 9:03 AM

## 2012-06-24 NOTE — Patient Instructions (Signed)
F/u 3 months  Take metformin once a day for 2 weeks, then 2 tablets in the morning after the 1st 2 weeks

## 2012-09-25 ENCOUNTER — Ambulatory Visit: Payer: BC Managed Care – PPO | Admitting: Family Medicine

## 2012-10-03 ENCOUNTER — Encounter: Payer: Self-pay | Admitting: Family Medicine

## 2012-10-03 ENCOUNTER — Ambulatory Visit (INDEPENDENT_AMBULATORY_CARE_PROVIDER_SITE_OTHER): Payer: BC Managed Care – PPO | Admitting: Family Medicine

## 2012-10-03 VITALS — BP 130/82 | HR 84 | Temp 98.1°F | Ht 70.0 in | Wt 332.0 lb

## 2012-10-03 DIAGNOSIS — E785 Hyperlipidemia, unspecified: Secondary | ICD-10-CM

## 2012-10-03 DIAGNOSIS — E1129 Type 2 diabetes mellitus with other diabetic kidney complication: Secondary | ICD-10-CM

## 2012-10-03 DIAGNOSIS — N529 Male erectile dysfunction, unspecified: Secondary | ICD-10-CM

## 2012-10-03 DIAGNOSIS — I10 Essential (primary) hypertension: Secondary | ICD-10-CM

## 2012-10-03 LAB — BASIC METABOLIC PANEL
BUN: 12 mg/dL (ref 6–23)
CO2: 30 mEq/L (ref 19–32)
Calcium: 9.3 mg/dL (ref 8.4–10.5)
Chloride: 101 mEq/L (ref 96–112)
Creatinine, Ser: 0.8 mg/dL (ref 0.4–1.5)
GFR: 142.38 mL/min (ref >60.00)
Glucose, Bld: 152 mg/dL — ABNORMAL HIGH (ref 70–99)
Potassium: 4.3 mEq/L (ref 3.5–5.1)
Sodium: 136 mEq/L (ref 135–145)

## 2012-10-03 LAB — HEMOGLOBIN A1C: Hgb A1c MFr Bld: 9.1 % — ABNORMAL HIGH (ref 4.6–6.5)

## 2012-10-03 MED ORDER — TADALAFIL 5 MG PO TABS: 5.0000 mg | ORAL_TABLET | Freq: Every day | ORAL | Status: DC

## 2012-10-03 NOTE — Progress Notes (Signed)
[facility] at [facility] [address] [place] [address] Phone: [phone] Fax: [phone]  Date:  10/03/2012   Name:  Austin Shaw   DOB:  [birth year]   MRN:  [id number] Gender: male Age: 47 y.o.  Primary Physician:  Hannah Beat, MD  Evaluating MD: Hannah Beat, MD   Chief Complaint: Follow-up   History of Present Illness:  Austin Shaw is a 47 y.o. pleasant patient who presents with the following:  F/u DM: Lost weight: 5 lbs  Diabetes Mellitus: Tolerating Medications: yes Compliance with diet: poor Exercise: minimal Avg blood sugars at home: not checking Foot problems: none Hypoglycemia: none No nausea, vomitting, blurred vision, polyuria.  Lab Results  Component Value Date   HGBA1C 10.6* 06/19/2012    Wt Readings from Last 3 Encounters:  10/03/12 332 lb (150.594 kg)  06/24/12 337 lb 4 oz (152.976 kg)  12/21/10 327 lb 12.8 oz (148.689 kg)    Body mass index is 47.64 kg/(m^2).   HTN: Tolerating all medications without side effects Stable and at goal No CP, no sob. No HA.  BP Readings from Last 3 Encounters:  10/03/12 130/82  06/24/12 140/92  12/21/10 130/80    Basic Metabolic Panel:    Component Value Date/Time   NA 137 06/19/2012 1048   K 4.4 06/19/2012 1048   CL 102 06/19/2012 1048   CO2 30 06/19/2012 1048   BUN 9 06/19/2012 1048   CREATININE 0.8 06/19/2012 1048   GLUCOSE 150* 06/19/2012 1048   CALCIUM 9.2 06/19/2012 1048    ED, bothering him a lot.  Patient Active Problem List   Diagnosis Date Noted  . Impotence 12/21/2010  . Routine general medical examination at a health care facility 10/16/2010  . OTHER MALAISE AND FATIGUE 11/11/2008  . OBESITY, MORBID 08/14/2008  . DM W/RENAL MNFST, TYPE II, UNCONTROLLED 12/26/2006  . HYPERLIPIDEMIA 12/26/2006  . HYPERTENSION 12/26/2006    Past Medical History  Diagnosis Date  . Diabetes mellitus     type 2  . Hyperlipidemia   . Hypertension   . Morbid obesity       BMI 50    No past surgical history on file.  History   Social History  . Marital Status: Married    Spouse Name: N/A    Number of Children: N/A  . 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 Smoking status: Never Smoker   . Smokeless tobacco: Not on file  . Alcohol Use: No  . Drug Use: Not on file  . Sexually Active: Not on file   Other Topics Concern  . Not on file   Social History Narrative  . No narrative on file    Family History  Problem Relation Age of Onset  . Diabetes      fam hx 1st degree relative    No Known Allergies  Medication list has been reviewed and updated.  Outpatient Prescriptions Prior to Visit  Medication Sig Dispense Refill  . aspirin EC 81 MG tablet       . glucose blood test strip Use as instructed to test blood sugar three times daily  100 each  11  . lisinopril (PRINIVIL,ZESTRIL) 10 MG tablet Take 1 tablet (10 mg total) by mouth daily.  30 tablet  5  . metFORMIN (GLUCOPHAGE-XR) 500 MG 24 hr tablet Take 2 tablets (1,000 mg total) by mouth daily with breakfast.  60  tablet  5  . ONE TOUCH LANCETS MISC Use to test blood sugar three times a day (PLEASE GENERIC to fit his machine)  90 each  11   No facility-administered medications prior to visit.    Review of Systems:   GEN: No acute illnesses, no fevers, chills. GI: No n/v/d, eating normally Pulm: No SOB Interactive and getting along well at home.  Otherwise, ROS is as per the HPI.   Physical Examination: BP 130/82  Pulse 84  Temp(Src) 98.1 F (36.7 C) (Oral)  Ht 5\' 10"  (1.778 m)  Wt 332 lb (150.594 kg)  BMI 47.64 kg/m2  SpO2 98%  Ideal Body Weight: Weight in (lb) to have BMI = 25: 173.9   GEN: WDWN, NAD, Non-toxic, A & O x 3 HEENT: Atraumatic, Normocephalic. Neck supple. No masses, No LAD. Ears and Nose: No external deformity. CV: RRR, No M/G/R. No JVD. No thrill. No extra heart sounds. PULM: CTA B, no  wheezes, crackles, rhonchi. No retractions. No resp. distress. No accessory muscle use. EXTR: No c/c/e NEURO Normal gait.  PSYCH: Normally interactive. Conversant. Not depressed or anxious appearing.  Calm demeanor.    Assessment and Plan:  DM W/RENAL MNFST, TYPE II, UNCONTROLLED - Plan: Hemoglobin A1c, Basic metabolic panel  HYPERLIPIDEMIA  OBESITY, MORBID  Impotence  Check a1c, only taking metformin 1 tab a day. Suspect will need to titrate up.  Trial of cialis  Orders Today:  Orders Placed This Encounter  Procedures  . Hemoglobin A1c  . Basic metabolic panel    Order Specific Question:  Has the patient fasted?    Answer:  Yes    Updated Medication List: (Includes new medications, updates to list, dose adjustments) Meds ordered this encounter  Medications  . tadalafil (CIALIS) 5 MG tablet    Sig: Take 1 tablet (5 mg total) by mouth daily.    Dispense:  30 tablet    Refill:  11    Medications Discontinued: There are no discontinued medications.    Signed, Elpidio Galea. Copland, MD 10/03/2012 8:11 AM

## 2012-10-04 MED ORDER — METFORMIN HCL ER 500 MG PO TB24: ORAL_TABLET | ORAL | Status: DC

## 2012-10-04 NOTE — Addendum Note (Signed)
Addended by: Consuello Masse on: 10/04/2012 02:24 PM   Modules accepted: Orders

## 2012-10-08 ENCOUNTER — Telehealth: Payer: Self-pay

## 2012-10-08 NOTE — Telephone Encounter (Signed)
Prior auth required for Cialis; form in Dr [employer] in box.(Dr Patsy Lager out of office).

## 2012-10-08 NOTE — Telephone Encounter (Signed)
I believe this can await Dr. Patsy Lager return.  Placed request in his inbox.

## 2012-10-12 NOTE — Telephone Encounter (Signed)
Will complete 

## 2012-10-15 NOTE — Telephone Encounter (Signed)
Completed form faxed to [phone] as instructed.

## 2012-10-16 NOTE — Telephone Encounter (Signed)
Denial letter received and placed in Dr Copland's in box.

## 2012-10-16 NOTE — Telephone Encounter (Signed)
ok 

## 2012-10-17 ENCOUNTER — Other Ambulatory Visit: Payer: Self-pay

## 2012-12-20 ENCOUNTER — Telehealth: Payer: Self-pay | Admitting: Family Medicine

## 2012-12-23 NOTE — Telephone Encounter (Signed)
Enc opened in error

## 2012-12-30 ENCOUNTER — Other Ambulatory Visit: Payer: Self-pay | Admitting: Family Medicine

## 2012-12-30 DIAGNOSIS — E1059 Type 1 diabetes mellitus with other circulatory complications: Secondary | ICD-10-CM

## 2012-12-30 DIAGNOSIS — E785 Hyperlipidemia, unspecified: Secondary | ICD-10-CM

## 2013-01-03 ENCOUNTER — Other Ambulatory Visit: Payer: BC Managed Care – PPO

## 2013-01-06 ENCOUNTER — Encounter: Payer: BC Managed Care – PPO | Admitting: Family Medicine

## 2013-01-06 DIAGNOSIS — Z0289 Encounter for other administrative examinations: Secondary | ICD-10-CM

## 2014-03-11 ENCOUNTER — Telehealth: Payer: Self-pay | Admitting: Family Medicine

## 2014-03-11 NOTE — Telephone Encounter (Signed)
Patient's wife called and said patient would like to switch from Dr.Copland to Dr. Para March.  Patient didn't like the way Dr.Copland spoke to him about his diabetes.  Patient's daughter saw Dr.Duncan one time and patient's wife said he was very knowledgeable and he'd like to switch to Dr.Duncan.  Patient is due for a physical. Please advise.

## 2014-03-11 NOTE — Telephone Encounter (Signed)
Okay with me if okay with PCP.  Needs CPE scheduled but I don't have CPE slots soon.  He can get a DM2 f/u appointment with labs ahead of time, in the near future.  Thanks.

## 2014-03-12 LAB — HM DIABETES EYE EXAM

## 2014-03-12 NOTE — Telephone Encounter (Signed)
i am direct in my discussion about disease processes including diabetes and morbid obesity. This will not change.  If this does not work for the patient, then we will likely not have a good doctor-patient relationship.  I think the patient from memory is fairly nice, and I do not seen any barriers to transfer.  Electronically Signed  By: Hannah Beat, MD On: 03/12/2014 9:50 AM

## 2014-03-15 ENCOUNTER — Other Ambulatory Visit: Payer: Self-pay | Admitting: Family Medicine

## 2014-03-15 DIAGNOSIS — IMO0002 Reserved for concepts with insufficient information to code with codable children: Secondary | ICD-10-CM

## 2014-03-15 DIAGNOSIS — E1165 Type 2 diabetes mellitus with hyperglycemia: Principal | ICD-10-CM

## 2014-03-15 DIAGNOSIS — E1129 Type 2 diabetes mellitus with other diabetic kidney complication: Secondary | ICD-10-CM

## 2014-03-16 ENCOUNTER — Other Ambulatory Visit (INDEPENDENT_AMBULATORY_CARE_PROVIDER_SITE_OTHER): Payer: BC Managed Care – PPO

## 2014-03-16 DIAGNOSIS — IMO0002 Reserved for concepts with insufficient information to code with codable children: Secondary | ICD-10-CM

## 2014-03-16 DIAGNOSIS — E1165 Type 2 diabetes mellitus with hyperglycemia: Secondary | ICD-10-CM

## 2014-03-16 DIAGNOSIS — E1129 Type 2 diabetes mellitus with other diabetic kidney complication: Secondary | ICD-10-CM

## 2014-03-16 LAB — LIPID PANEL
Cholesterol: 209 mg/dL — ABNORMAL HIGH (ref 0–200)
HDL: 43.9 mg/dL (ref >39.00)
LDL Cholesterol: 139 mg/dL — ABNORMAL HIGH (ref 0–99)
NonHDL: 165.1
Total CHOL/HDL Ratio: 5
Triglycerides: 130 mg/dL (ref 0.0–149.0)
VLDL: 26 mg/dL (ref 0.0–40.0)

## 2014-03-16 LAB — COMPREHENSIVE METABOLIC PANEL
ALT: 21 U/L (ref 0–53)
AST: 22 U/L (ref 0–37)
Albumin: 3.2 g/dL — ABNORMAL LOW (ref 3.5–5.2)
Alkaline Phosphatase: 80 U/L (ref 39–117)
BUN: 11 mg/dL (ref 6–23)
CO2: 29 mEq/L (ref 19–32)
Calcium: 8.7 mg/dL (ref 8.4–10.5)
Chloride: 99 mEq/L (ref 96–112)
Creatinine, Ser: 0.8 mg/dL (ref 0.4–1.5)
GFR: 126.05 mL/min (ref >60.00)
Glucose, Bld: 325 mg/dL — ABNORMAL HIGH (ref 70–99)
Potassium: 4.6 mEq/L (ref 3.5–5.1)
Sodium: 135 mEq/L (ref 135–145)
Total Bilirubin: 0.7 mg/dL (ref 0.2–1.2)
Total Protein: 7 g/dL (ref 6.0–8.3)

## 2014-03-16 LAB — HEMOGLOBIN A1C: Hgb A1c MFr Bld: 14.5 % — ABNORMAL HIGH (ref 4.6–6.5)

## 2014-03-22 ENCOUNTER — Encounter: Payer: Self-pay | Admitting: Family Medicine

## 2014-03-22 DIAGNOSIS — E11319 Type 2 diabetes mellitus with unspecified diabetic retinopathy without macular edema: Secondary | ICD-10-CM | POA: Insufficient documentation

## 2014-03-24 ENCOUNTER — Encounter: Payer: Self-pay | Admitting: Family Medicine

## 2014-03-24 ENCOUNTER — Ambulatory Visit (INDEPENDENT_AMBULATORY_CARE_PROVIDER_SITE_OTHER): Payer: BC Managed Care – PPO | Admitting: Family Medicine

## 2014-03-24 VITALS — BP 150/86 | HR 86 | Temp 98.9°F | Wt 348.5 lb

## 2014-03-24 DIAGNOSIS — E118 Type 2 diabetes mellitus with unspecified complications: Secondary | ICD-10-CM

## 2014-03-24 DIAGNOSIS — IMO0002 Reserved for concepts with insufficient information to code with codable children: Secondary | ICD-10-CM

## 2014-03-24 DIAGNOSIS — E1165 Type 2 diabetes mellitus with hyperglycemia: Secondary | ICD-10-CM

## 2014-03-24 MED ORDER — INSULIN GLARGINE 100 UNIT/ML SOLOSTAR PEN: PEN_INJECTOR | SUBCUTANEOUS | Status: DC

## 2014-03-24 MED ORDER — INSULIN PEN NEEDLE 31G X 6 MM MISC: Status: DC

## 2014-03-24 MED ORDER — METFORMIN HCL ER 500 MG PO TB24: ORAL_TABLET | ORAL | Status: DC

## 2014-03-24 NOTE — Assessment & Plan Note (Signed)
Off all meds.  We are "starting from scratch."   D/w pt about DM2, uncontrolled, with complications.  Path/phys d/w pt.  Diet advice discussed, he knows to "avoid the whites."  Refer for DM2 teaching.  Restart metformin, titrate up, continue liberal PO water.  He has had polydipsia and polyuria, mainly at night.  D/w pt about insulin. He'll get the pens and needles.  If he can't get the DM2 class done soon, then I'll do the DM2 teaching on the pens here in the clinic.  >25 minutes spent in face to face time with patient, >50% spent in counselling or coordination of care.  He has other issues to address, ACE, vaccinations,etc, and we'll work on those as we go along.  If any sx of dehydration, to ER. He agrees.

## 2014-03-24 NOTE — Patient Instructions (Signed)
Austin Shaw will call about your referral. Start back on metformin, 1 a day for 1 week, then 2 a day, then gradually work up to 4 a day.  Get the pens and needles and go the diabetes class.  See above for insulin dosing.  Update me about 1 week after your start the insulin.  Keep drinking a lot of water.  Plan on a recheck in 3 months with labs ahead of time, but we'll be in touch in the meantime.  Take care.  Glad to see you.

## 2014-03-24 NOTE — Progress Notes (Signed)
Pre visit review using our clinic review tool, if applicable. No additional management support is needed unless otherwise documented below in the visit note.  Diabetes:  Off all meds Hypoglycemic episodes:no Hyperglycemic episodes: yes, based on labs.  Feet problems: dec sensation on soles of feet Blood Sugars averaging: not checked eye exam within last year: yes, recently, with retinopathy.  Labs d/w pt.   PMH and SH reviewed  Meds, vitals, and allergies reviewed.   ROS: See HPI.  Otherwise negative.    GEN: nad, alert and oriented HEENT: mucous membranes moist NECK: supple w/o LA CV: rrr. PULM: ctab, no inc wob ABD: soft, +bs EXT: no edema SKIN: no acute rash  Diabetic foot exam: Normal inspection No skin breakdown No calluses  Normal DP pulses Dec sensation to light touch and monofilament on soles of feet Nails normal

## 2014-03-26 ENCOUNTER — Encounter: Payer: Self-pay | Admitting: Family Medicine

## 2014-03-26 ENCOUNTER — Ambulatory Visit (INDEPENDENT_AMBULATORY_CARE_PROVIDER_SITE_OTHER): Payer: BC Managed Care – PPO | Admitting: Family Medicine

## 2014-03-26 ENCOUNTER — Ambulatory Visit: Payer: BC Managed Care – PPO | Admitting: Family Medicine

## 2014-03-26 VITALS — BP 112/82 | HR 84 | Temp 98.8°F

## 2014-03-26 DIAGNOSIS — IMO0002 Reserved for concepts with insufficient information to code with codable children: Secondary | ICD-10-CM

## 2014-03-26 DIAGNOSIS — E118 Type 2 diabetes mellitus with unspecified complications: Secondary | ICD-10-CM

## 2014-03-26 DIAGNOSIS — E1165 Type 2 diabetes mellitus with hyperglycemia: Secondary | ICD-10-CM

## 2014-03-26 NOTE — Progress Notes (Signed)
Pre visit review using our clinic review tool, if applicable. No additional management support is needed unless otherwise documented below in the visit note.  Here today with wife.  She had given other family members insulin injections in the past.  Needs instruction on lantus pen.  Has pens and needles.  D/w pt.   Meds, vitals, and allergies reviewed.   ROS: See HPI.  Otherwise, noncontributory.  nad Exam deferred.

## 2014-03-26 NOTE — Patient Instructions (Signed)
Leave the open pen out of the fridge.   Sterile pen tip with a sterile needle into a cleaned area of skin.  Needles in a sharps container.   01-19-13-16-18 etc at night, as long as your AM sugar is >150.  Next dose if PM 03/27/14.  Update me next week.

## 2014-03-27 ENCOUNTER — Encounter: Payer: Self-pay | Admitting: Family Medicine

## 2014-03-27 NOTE — Assessment & Plan Note (Signed)
Instructions pamphlet given to patient.  Routine instructions given.  Wife gave patient 10 unit injection in his abd with sterile technique, no complications or troubles.  Patient tolerated well.  He was pleasantly surprised by the process.   All questions answered. He'll start checking AM sugar and then inc his lantus dose, see AVS.  He'll update me in a few days, sooner if needed. Routine pen maintenance instructions given. >15 minutes spent in face to face time with patient, >50% spent in counselling or coordination of care.

## 2014-03-30 ENCOUNTER — Ambulatory Visit: Payer: BC Managed Care – PPO | Admitting: *Deleted

## 2014-04-08 ENCOUNTER — Encounter: Payer: Self-pay | Admitting: Family Medicine

## 2014-06-08 ENCOUNTER — Telehealth: Payer: Self-pay | Admitting: Family Medicine

## 2014-06-08 DIAGNOSIS — IMO0002 Reserved for concepts with insufficient information to code with codable children: Secondary | ICD-10-CM

## 2014-06-08 DIAGNOSIS — E1165 Type 2 diabetes mellitus with hyperglycemia: Secondary | ICD-10-CM

## 2014-06-08 NOTE — Telephone Encounter (Signed)
Ordered labs, please schedule.  Doesn't have to fast for labs.  Thanks.

## 2014-06-08 NOTE — Telephone Encounter (Signed)
Left detailed message on voicemail.  

## 2014-06-08 NOTE — Telephone Encounter (Signed)
Spouse made follow up appointment for wed.  She stated mr Bice needed his a1c checked prior  Is it ok to schedule labs

## 2014-06-09 ENCOUNTER — Other Ambulatory Visit (INDEPENDENT_AMBULATORY_CARE_PROVIDER_SITE_OTHER): Payer: BC Managed Care – PPO

## 2014-06-09 DIAGNOSIS — IMO0002 Reserved for concepts with insufficient information to code with codable children: Secondary | ICD-10-CM

## 2014-06-09 DIAGNOSIS — E1165 Type 2 diabetes mellitus with hyperglycemia: Secondary | ICD-10-CM

## 2014-06-09 LAB — BASIC METABOLIC PANEL
BUN: 11 mg/dL (ref 6–23)
CO2: 30 mEq/L (ref 19–32)
Calcium: 9.1 mg/dL (ref 8.4–10.5)
Chloride: 101 mEq/L (ref 96–112)
Creatinine, Ser: 0.84 mg/dL (ref 0.40–1.50)
GFR: 125.92 mL/min (ref >60.00)
Glucose, Bld: 133 mg/dL — ABNORMAL HIGH (ref 70–99)
Potassium: 4.1 mEq/L (ref 3.5–5.1)
Sodium: 137 mEq/L (ref 135–145)

## 2014-06-09 LAB — HEMOGLOBIN A1C: Hgb A1c MFr Bld: 8.8 % — ABNORMAL HIGH (ref 4.6–6.5)

## 2014-06-10 ENCOUNTER — Ambulatory Visit (INDEPENDENT_AMBULATORY_CARE_PROVIDER_SITE_OTHER): Payer: BC Managed Care – PPO | Admitting: Family Medicine

## 2014-06-10 ENCOUNTER — Encounter: Payer: Self-pay | Admitting: Family Medicine

## 2014-06-10 VITALS — BP 140/90 | HR 100 | Temp 98.9°F | Wt 347.0 lb

## 2014-06-10 DIAGNOSIS — E1165 Type 2 diabetes mellitus with hyperglycemia: Secondary | ICD-10-CM

## 2014-06-10 DIAGNOSIS — R05 Cough: Secondary | ICD-10-CM

## 2014-06-10 DIAGNOSIS — R059 Cough, unspecified: Secondary | ICD-10-CM

## 2014-06-10 DIAGNOSIS — IMO0002 Reserved for concepts with insufficient information to code with codable children: Secondary | ICD-10-CM

## 2014-06-10 DIAGNOSIS — E118 Type 2 diabetes mellitus with unspecified complications: Secondary | ICD-10-CM

## 2014-06-10 MED ORDER — LORATADINE 10 MG PO TABS: 10.0000 mg | ORAL_TABLET | Freq: Every day | ORAL | Status: DC

## 2014-06-10 MED ORDER — INSULIN GLARGINE 100 UNIT/ML SOLOSTAR PEN: PEN_INJECTOR | SUBCUTANEOUS | Status: DC

## 2014-06-10 MED ORDER — METFORMIN HCL ER 500 MG PO TB24: 500.0000 mg | ORAL_TABLET | Freq: Two times a day (BID) | ORAL | Status: DC

## 2014-06-10 NOTE — Progress Notes (Signed)
Pre visit review using our clinic review tool, if applicable. No additional management support is needed unless otherwise documented below in the visit note.  DM2 f/u.  A1c much improved.  No nocturia, no dry mouth.  Sleeping better.  Working on diet and weight.  Labs d/w pt.  Compliant with meds.  Max metformin 2 tabs a day.  GI upset o/w, at higher dose.  Sugar has been ~120-130s recently.  Cough.  Sick initially about 1 month ago.  Sx got better except for the cough.  No sputum.  No fevers.  No loss of appetite.  "I can be talking an then I get a rumbling and a catch in my chest."  He has had ear popping and cracking on the R side.  Cough worse at night.  Cough was getting better, then returned recently.  Not stuffy.  Some post nasal gtt.    Meds, vitals, and allergies reviewed.   ROS: See HPI.  Otherwise, noncontributory.  GEN: nad, alert and oriented HEENT: mucous membranes moist, tm w/o erythema, nasal exam w/o erythema, clear discharge noted,  OP with cobblestoning NECK: supple w/o LA CV: rrr.   PULM: ctab, no inc wob, + UAN EXT: no edema SKIN: no acute rash

## 2014-06-10 NOTE — Patient Instructions (Signed)
Recheck labs before a visit in about 3 months.   Think about getting the pneumonia shot.   Keep working on your diet and weight.  Try taking claritin 10mg  a day for the cough.  That may help.  Take care.  Glad to see you.

## 2014-06-11 ENCOUNTER — Encounter: Payer: Self-pay | Admitting: Family Medicine

## 2014-06-11 DIAGNOSIS — R05 Cough: Secondary | ICD-10-CM | POA: Insufficient documentation

## 2014-06-11 DIAGNOSIS — R059 Cough, unspecified: Secondary | ICD-10-CM | POA: Insufficient documentation

## 2014-06-11 NOTE — Assessment & Plan Note (Signed)
Likely from post nasal gtt.  Start plain claritin and f/u prn.  He agrees.

## 2014-06-11 NOTE — Assessment & Plan Note (Signed)
Much improved, likely with A1c lag.  D/w pt. Continue meds and diet for now.  Recheck in about 3 months.  D/w pt about routine DM2 care, MALB, PNA vaccine, etc.  He understood and we'll chip away at that as we go along.  Okay for outpatient f/u.

## 2014-06-12 ENCOUNTER — Other Ambulatory Visit: Payer: Self-pay | Admitting: Family Medicine

## 2014-06-12 MED ORDER — SILDENAFIL CITRATE 100 MG PO TABS: 50.0000 mg | ORAL_TABLET | Freq: Every day | ORAL | Status: DC | PRN

## 2014-06-16 ENCOUNTER — Other Ambulatory Visit: Payer: Self-pay | Admitting: Family Medicine

## 2014-06-16 MED ORDER — SILDENAFIL CITRATE 20 MG PO TABS: 60.0000 mg | ORAL_TABLET | Freq: Every day | ORAL | Status: DC | PRN

## 2014-07-06 ENCOUNTER — Ambulatory Visit (INDEPENDENT_AMBULATORY_CARE_PROVIDER_SITE_OTHER): Payer: BC Managed Care – PPO | Admitting: Primary Care

## 2014-07-06 ENCOUNTER — Ambulatory Visit (INDEPENDENT_AMBULATORY_CARE_PROVIDER_SITE_OTHER)
Admission: RE | Admit: 2014-07-06 | Discharge: 2014-07-06 | Disposition: A | Discharge status: Home / Self Care | Payer: BC Managed Care – PPO | Source: Ambulatory Visit | Attending: Primary Care | Admitting: Primary Care

## 2014-07-06 ENCOUNTER — Encounter: Payer: Self-pay | Admitting: Primary Care

## 2014-07-06 ENCOUNTER — Telehealth: Payer: Self-pay | Admitting: Primary Care

## 2014-07-06 VITALS — BP 148/100 | HR 103 | Temp 99.3°F | Ht 70.0 in | Wt 352.0 lb

## 2014-07-06 DIAGNOSIS — R05 Cough: Secondary | ICD-10-CM

## 2014-07-06 DIAGNOSIS — J189 Pneumonia, unspecified organism: Secondary | ICD-10-CM

## 2014-07-06 DIAGNOSIS — R059 Cough, unspecified: Secondary | ICD-10-CM

## 2014-07-06 MED ORDER — LEVOFLOXACIN 750 MG PO TABS: 750.0000 mg | ORAL_TABLET | Freq: Every day | ORAL | Status: DC

## 2014-07-06 MED ORDER — HYDROCOD POLST-CHLORPHEN POLST 10-8 MG/5ML PO LQCR: 5.0000 mL | Freq: Two times a day (BID) | ORAL | Status: DC | PRN

## 2014-07-06 NOTE — Progress Notes (Signed)
Pre visit review using our clinic review tool, if applicable. No additional management support is needed unless otherwise documented below in the visit note. 

## 2014-07-06 NOTE — Telephone Encounter (Signed)
Please notify Mr. Austin Shaw that he has pneumonia and that I've called in Levaquin 750mg  tablets to his pharmacy. He is to take one tablet by mouth daily for 5 days. Also, I have printed out a prescription for Tussionex for cough. He can come pick that up at his convenience. He needs to push oral fluids, especially water. Have him call me if no improvement in 3-4 days.  Thank you!

## 2014-07-06 NOTE — Patient Instructions (Signed)
Complete chest xray prior to leaving. I will notify you of the results as discussed. I will also call in some cough medication to your pharmacy. Please let me know if your symptoms do not improve in the next 3-4 days.

## 2014-07-06 NOTE — Telephone Encounter (Signed)
Called and notified patient of Kate's comments. Tried to notified patient of Kate's comments. Connections was bad. Could not hear the patient well but tried to explain as much as possible. Left rx in the front office and also wrote a letter with Kate's comments for patient since bad phone connections.

## 2014-07-06 NOTE — Assessment & Plan Note (Signed)
Evidenced by examination and chest xray. RX for Levaquin to pharmacy for a 5 day course and Tussionex to help with cough. Encouraged plenty of fluids, especially water and to follow up if no improvement in the next 3-4 days.  Lab Results  Component Value Date   CREATININE 0.84 06/09/2014

## 2014-07-06 NOTE — Progress Notes (Signed)
Subjective:    Patient ID: Austin Shaw, male    DOB: February 13, 1966, 49 y.o.   MRN: [id number]  HPI  Mr. Eshelman is a 49 year old male who presents today with a chief complaint of cough that has been present for 6 weeks. His cough is non productive with some shortness of breath and fatigue. Over the past 2 weeks his cough and fatigue have worsened. He denies chest pain, sore throat, ear pain, headaches. He's been taking Robitussin, Zyrtec, Claritin, Delsym and Delsym without relief.   Review of Systems  Constitutional: Positive for fatigue. Negative for fever and chills.  HENT: Negative for ear pain, rhinorrhea, sinus pressure and sore throat.   Respiratory: Positive for cough and shortness of breath.   Cardiovascular: Negative for chest pain.  Gastrointestinal: Negative for nausea and vomiting.  Neurological: Negative for dizziness and headaches.      Past Medical History  Diagnosis Date  . Diabetes mellitus     type 2  . Hyperlipidemia   . Hypertension   . Morbid obesity     BMI 50    History   Social History  . Marital Status: Married    Spouse Name: N/A  . Number of Children: N/A  . 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 Smoking status: Never Smoker   . Smokeless tobacco: Never Used  . Alcohol Use: No  . Drug Use: No  . Sexual Activity: Not on file   Other Topics Concern  . 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  Family History  Problem Relation Age of Onset  . Diabetes      fam hx 1st degree relative  . Diabetes Father     Allergies  Allergen Reactions  . Metformin And Related Other (See Comments)    Max dose  a day.     Current Outpatient Prescriptions on File Prior to Visit  Medication Sig Dispense Refill  . Insulin Glargine (LANTUS SOLOSTAR) 100  UNIT/ML Solostar Pen Inject 22 units at night. Add 2 units per day if AM sugar is >150.If AM sugar 100-150, no change in dose. If AM sugar <100, decrease 2 units    . Insulin Pen Needle 31G X 6 MM MISC Use daily with insulin pen 100 each 12  . loratadine (CLARITIN) 10 MG tablet Take 1 tablet (10 mg total) by mouth daily.    . metFORMIN (GLUCOPHAGE-XR) 500 MG 24 hr tablet Take 1 tablet (500 mg total) by mouth 2 (two) times daily.    . sildenafil (REVATIO) 20 MG tablet Take 3-5 tablets (60-100 mg total) by mouth daily as needed. 50 tablet 2   No current facility-administered medications on file prior to visit.    BP 148/100 mmHg  Pulse 103  Temp(Src) 99.3 F (37.4 C) (Oral)  Ht  (1.778 m)  Wt 352 lb (159.666 kg)  BMI 50.51 kg/m2  SpO2 98%   Objective:   Physical Exam  Constitutional: He is oriented to person, place, and time.  Appears sickly  Neck: Neck supple.  Cardiovascular: Regular rhythm.   Sinus tachycardia at about 105  Pulmonary/Chest: Effort normal. No accessory muscle usage. No respiratory distress. He has decreased breath sounds in the right lower field. He  has no wheezes. He has rhonchi in the right lower field and the left lower field.  Diminished to RLL with rhonchi.  Lymphadenopathy:    He has no cervical adenopathy.  Neurological: He is alert and oriented to person, place, and time.  Skin: Skin is warm.  Psychiatric: He has a normal mood and affect.          Assessment & Plan:

## 2014-07-10 ENCOUNTER — Encounter: Payer: Self-pay | Admitting: Primary Care

## 2014-07-10 ENCOUNTER — Other Ambulatory Visit: Payer: Self-pay | Admitting: Primary Care

## 2014-07-10 ENCOUNTER — Encounter: Payer: Self-pay | Admitting: Family Medicine

## 2014-07-10 DIAGNOSIS — J189 Pneumonia, unspecified organism: Secondary | ICD-10-CM

## 2014-07-10 MED ORDER — BENZONATATE 200 MG PO CAPS: 200.0000 mg | ORAL_CAPSULE | Freq: Three times a day (TID) | ORAL | Status: DC | PRN

## 2014-07-10 MED ORDER — ALBUTEROL SULFATE HFA 108 (90 BASE) MCG/ACT IN AERS: 1.0000 | INHALATION_SPRAY | Freq: Four times a day (QID) | RESPIRATORY_TRACT | Status: DC | PRN

## 2014-07-10 NOTE — Telephone Encounter (Signed)
Usually 5 days is reasonable, ie wouldn't usually need a refill.  Please get update on patient.  Thanks.

## 2014-07-10 NOTE — Telephone Encounter (Signed)
Patient is complaining mostly with the "rough cough".  He says he coughs so hard it takes his breath away.  Other symptoms seem to have subsided.

## 2014-07-10 NOTE — Telephone Encounter (Signed)
See my chart message.  See what sx he still has.  That med is usually a 5 day course.  Thanks.

## 2014-07-10 NOTE — Telephone Encounter (Signed)
I called pt.  He is clinically improved but the cough persists in spite of the cough syrup.  Would be reasonable to try tessalon and/or SABA.  I sent both.   He likely doesn't need extended abx at this point.  I did ask that if he has clinical regression, fever, etc, to call back.  It would be reasonable for the abx to be extended at that point (and I'm okay with the on call MD doing that if needed); as of right now I'm try to max out benefit/risk ratio.

## 2014-07-23 ENCOUNTER — Ambulatory Visit: Payer: Self-pay | Admitting: Family Medicine

## 2014-07-30 ENCOUNTER — Ambulatory Visit: Payer: BC Managed Care – PPO | Admitting: Family Medicine

## 2014-08-03 ENCOUNTER — Encounter: Payer: Self-pay | Admitting: Family Medicine

## 2014-08-05 ENCOUNTER — Telehealth: Payer: Self-pay | Admitting: Family Medicine

## 2014-08-05 ENCOUNTER — Ambulatory Visit (INDEPENDENT_AMBULATORY_CARE_PROVIDER_SITE_OTHER)
Admission: RE | Admit: 2014-08-05 | Discharge: 2014-08-05 | Disposition: A | Discharge status: Home / Self Care | Payer: BC Managed Care – PPO | Source: Ambulatory Visit | Attending: Family Medicine | Admitting: Family Medicine

## 2014-08-05 ENCOUNTER — Ambulatory Visit (INDEPENDENT_AMBULATORY_CARE_PROVIDER_SITE_OTHER): Payer: BC Managed Care – PPO | Admitting: Family Medicine

## 2014-08-05 ENCOUNTER — Encounter: Payer: Self-pay | Admitting: Family Medicine

## 2014-08-05 VITALS — BP 140/90 | HR 90 | Temp 98.8°F | Wt 339.2 lb

## 2014-08-05 DIAGNOSIS — R0602 Shortness of breath: Secondary | ICD-10-CM

## 2014-08-05 MED ORDER — INSULIN GLARGINE 100 UNIT/ML SOLOSTAR PEN: PEN_INJECTOR | SUBCUTANEOUS | Status: DC

## 2014-08-05 MED ORDER — SILDENAFIL CITRATE 20 MG PO TABS: 60.0000 mg | ORAL_TABLET | Freq: Every day | ORAL | Status: DC | PRN

## 2014-08-05 NOTE — Progress Notes (Signed)
Pre visit review using our clinic review tool, if applicable. No additional management support is needed unless otherwise documented below in the visit note.  Cough, worse at night.  Some occ phlegm, but not discolored.  Variable level of coughing, can happen in fits.  SOB with the coughing fits.  Can get SOB with prolonged walking, will need to catch his breath, started when he got sick with CAP this year- not longstanding or prior to that illness.  No fevers.  Inhaler doesn't help the cough now, did prev but not now.  Tessalon helped more for the cough prev.  No fevers.  He feels good at rest, not like he did when he was sick.    Meds, vitals, and allergies reviewed.   ROS: See HPI.  Otherwise, noncontributory.  GEN: nad, alert and oriented HEENT: mucous membranes moist NECK: supple w/o LA CV: rrr. PULM: ctab, no inc wob ABD: soft, +bs EXT: trace edema SKIN: no acute rash

## 2014-08-05 NOTE — Telephone Encounter (Signed)
-----   Message from Annamarie Major, [place] sent at 08/04/2014  3:53 PM EDT ----- Patient coming in for OV.  If patient asks about the PA on the Sildenafil, it has been denied by his insurance company through [facility].

## 2014-08-05 NOTE — Telephone Encounter (Signed)
Noted, thanks!

## 2014-08-05 NOTE — Patient Instructions (Signed)
Go to the lab on the way out.  We'll contact you with your xray report. Shirlee Limerick will call about your referral. Take care.  Glad to see you.

## 2014-08-06 DIAGNOSIS — R0602 Shortness of breath: Secondary | ICD-10-CM | POA: Insufficient documentation

## 2014-08-06 NOTE — Assessment & Plan Note (Signed)
With cough.  Could be from post infectious cough, check CXR today- see report.  With cardiomegaly noted, this may or may not be significant, check echo.  No PND.  No orthopnea.  Still ctab today.  No h/o asthma, COPD, ace use.  Could have silent gerd contributing.  D/w pt.  No heartburn sx.  D/w pt about weight and DM2 mgmt.  Continue to work on weight loss.  BP okay today, though this could have contributed to possible cardiomegaly prev.  Will await echo.  >25 minutes spent in face to face time with patient, >50% spent in counselling or coordination of care.

## 2014-08-10 ENCOUNTER — Encounter: Payer: Self-pay | Admitting: Family Medicine

## 2014-08-10 ENCOUNTER — Other Ambulatory Visit (HOSPITAL_COMMUNITY): Payer: BC Managed Care – PPO | Admitting: *Deleted

## 2014-08-10 ENCOUNTER — Ambulatory Visit (HOSPITAL_COMMUNITY): Payer: BC Managed Care – PPO | Attending: Family Medicine | Admitting: Cardiology

## 2014-08-10 DIAGNOSIS — R0602 Shortness of breath: Secondary | ICD-10-CM | POA: Diagnosis not present

## 2014-08-10 DIAGNOSIS — R0600 Dyspnea, unspecified: Secondary | ICD-10-CM

## 2014-08-10 MED ORDER — PERFLUTREN LIPID MICROSPHERE
1.0000 mL | INTRAVENOUS | Status: AC | PRN
  Administered 2014-08-10: 1 mL via INTRAVENOUS

## 2014-08-10 NOTE — Progress Notes (Addendum)
2D Echo with [facility] completed.   New patient at [facility] with no previous echos came in for dyspnea. Echo showed low EF.  Doc of Day, Dr. Graciela Husbands, was notified. An apointment was set up with Norma Fredrickson for Wednesday (08/13/2014) at 11am. Patient was sent home per Dr. Graciela Husbands. 08/10/2014

## 2014-08-11 ENCOUNTER — Encounter: Payer: Self-pay | Admitting: Family Medicine

## 2014-08-11 DIAGNOSIS — R931 Abnormal findings on diagnostic imaging of heart and coronary circulation: Secondary | ICD-10-CM | POA: Insufficient documentation

## 2014-08-11 DIAGNOSIS — I429 Cardiomyopathy, unspecified: Secondary | ICD-10-CM | POA: Insufficient documentation

## 2014-08-12 ENCOUNTER — Ambulatory Visit (INDEPENDENT_AMBULATORY_CARE_PROVIDER_SITE_OTHER): Payer: BC Managed Care – PPO | Admitting: Cardiology

## 2014-08-12 ENCOUNTER — Telehealth: Payer: Self-pay | Admitting: Family Medicine

## 2014-08-12 ENCOUNTER — Encounter: Payer: Self-pay | Admitting: Cardiology

## 2014-08-12 VITALS — BP 160/110 | HR 104 | Ht 69.5 in | Wt 348.4 lb

## 2014-08-12 DIAGNOSIS — R0600 Dyspnea, unspecified: Secondary | ICD-10-CM

## 2014-08-12 DIAGNOSIS — I429 Cardiomyopathy, unspecified: Secondary | ICD-10-CM | POA: Diagnosis not present

## 2014-08-12 DIAGNOSIS — I10 Essential (primary) hypertension: Secondary | ICD-10-CM | POA: Diagnosis not present

## 2014-08-12 LAB — BASIC METABOLIC PANEL
BUN: 11 mg/dL (ref 6–23)
CO2: 30 mEq/L (ref 19–32)
Calcium: 9.4 mg/dL (ref 8.4–10.5)
Chloride: 102 mEq/L (ref 96–112)
Creatinine, Ser: 0.87 mg/dL (ref 0.40–1.50)
GFR: 120.83 mL/min (ref >60.00)
Glucose, Bld: 159 mg/dL — ABNORMAL HIGH (ref 70–99)
Potassium: 4.2 mEq/L (ref 3.5–5.1)
Sodium: 137 mEq/L (ref 135–145)

## 2014-08-12 LAB — HEPATIC FUNCTION PANEL
ALT: 30 U/L (ref 0–53)
AST: 25 U/L (ref 0–37)
Albumin: 3.7 g/dL (ref 3.5–5.2)
Alkaline Phosphatase: 66 U/L (ref 39–117)
Bilirubin, Direct: 0.3 mg/dL (ref 0.0–0.3)
Total Bilirubin: 1.2 mg/dL (ref 0.2–1.2)
Total Protein: 7.5 g/dL (ref 6.0–8.3)

## 2014-08-12 LAB — CBC
HCT: 40 % (ref 39.0–52.0)
Hemoglobin: 13 g/dL (ref 13.0–17.0)
MCHC: 32.5 g/dL (ref 30.0–36.0)
MCV: 85.3 fl (ref 78.0–100.0)
Platelets: 211 10*3/uL (ref 150.0–400.0)
RBC: 4.7 Mil/uL (ref 4.22–5.81)
RDW: 15.8 % — ABNORMAL HIGH (ref 11.5–15.5)
WBC: 7.2 10*3/uL (ref 4.0–10.5)

## 2014-08-12 LAB — BRAIN NATRIURETIC PEPTIDE: Pro B Natriuretic peptide (BNP): 410 pg/mL — ABNORMAL HIGH (ref 0.0–100.0)

## 2014-08-12 LAB — TSH: TSH: 1.34 u[IU]/mL (ref 0.35–4.50)

## 2014-08-12 MED ORDER — FUROSEMIDE 40 MG PO TABS: 40.0000 mg | ORAL_TABLET | Freq: Every day | ORAL | Status: DC

## 2014-08-12 MED ORDER — CARVEDILOL 3.125 MG PO TABS: 3.1250 mg | ORAL_TABLET | Freq: Two times a day (BID) | ORAL | Status: DC

## 2014-08-12 MED ORDER — LOSARTAN POTASSIUM 25 MG PO TABS: 25.0000 mg | ORAL_TABLET | Freq: Every day | ORAL | Status: DC

## 2014-08-12 NOTE — Telephone Encounter (Signed)
Called pt.  He already has cards OV planned.  I want him to keep that appointment, app cards help.   I told him he would likely need other studies/meds to eval/treat his condition. I'll await cards input.  He doesn't have specific f/u with me about his echo. I am seeing him as prev scheduled at the end of this month.   He still has a cough, worse supine, but isn't SOB supine.  I don't know if his low EF is contributing to the cough; I suspect it isn't given that he didn't have plural effusions on last CXR.

## 2014-08-12 NOTE — Progress Notes (Signed)
CARDIOLOGY OFFICE NOTE  Date:  08/12/2014    Austin Shaw Date of Birth: Aug 10, 1965 Medical Record #[id number]  PCP:  Austin Givens, MD  Cardiologist:  Austin Shaw    Chief Complaint  Patient presents with  . LV dysfunction - new noted on echo    New patient visit - seen for Dr. Patty Shaw     History of Present Illness: Austin Shaw is a 49 y.o. male who presents today for a new patient visit today. Seen for Dr. Patty Shaw (DOD). He has long standing IDDM, HTN, HLD and morbid obesity. Weight is usually around 325 to 330.  No prior cardiac history.  Notes that he has been in his usual health until this past Easter - got abruptly sick with DOE, swelling and cough. Hard to lie down at night. Some foamy white sputum at times. Works at [employer] at [employer] and eats out almost every day. A1C has improved from 15 to 9. He can't sleep due to cough, especially with lying down. ?had pneumonia but has not improved with treatment. No chest pain. He does not smoke. No alcohol use. No drug use. He has had OSA and had tongue surgery many years ago.    Past Medical History  Diagnosis Date  . Diabetes mellitus     type 2  . Hyperlipidemia   . Hypertension   . Morbid obesity     BMI 50    No past surgical history on file.   Medications: Current Outpatient Prescriptions  Medication Sig Dispense Refill  . albuterol (PROVENTIL HFA;VENTOLIN HFA) 108 (90 BASE) MCG/ACT inhaler Inhale 1-2 puffs into the lungs every 6 (six) hours as needed (for cough). 1 Inhaler 1  . carvedilol (COREG) 3.125 MG tablet Take 1 tablet (3.125 mg total) by mouth 2 (two) times daily. 60 tablet 3  . furosemide (LASIX) 40 MG tablet Take 1 tablet (40 mg total) by mouth daily. 90 tablet 3  . Insulin Glargine (LANTUS SOLOSTAR) 100 UNIT/ML Solostar Pen Inject 20 units at night. Add 2 units per day if AM sugar is >150.If AM sugar 100-150, no change in dose. If AM sugar <100, decrease 2 units 15 mL   . Insulin Pen  Needle 31G X 6 MM MISC Use daily with insulin pen 100 each 12  . loratadine (CLARITIN) 10 MG tablet Take 1 tablet (10 mg total) by mouth daily.    [name] losartan (COZAAR) 25 MG tablet Take 1 tablet (25 mg total) by mouth daily. 30 tablet 3  . metFORMIN (GLUCOPHAGE-XR) 500 MG 24 hr tablet Take 1 tablet (500 mg total) by mouth 2 (two) times daily.    . sildenafil (REVATIO) 20 MG tablet Take 3-5 tablets (60-100 mg total) by mouth daily as needed. 50 tablet 2   No current facility-administered medications for this visit.    Allergies: Allergies  Allergen Reactions  . Metformin And Related Other (See Comments)    Max dose  a day.     Social History: The patient  reports that he has never smoked. He has never used smokeless tobacco. He reports that he does not drink alcohol or use illicit drugs.   Family History: The patient's family history includes Congestive Heart Failure in an other family member; Diabetes in his father and another family member.   Review of Systems: Please see the history of present illness.   All other systems are reviewed and negative.   Physical Exam: VS:  BP 160/110 mmHg  Pulse 104  Ht 5' 9.5" (1.765 m)  Wt 348 lb 6.4 oz (158.033 kg)  BMI 50.73 kg/m2  SpO2 92% .  BMI Body mass index is 50.73 kg/(m^2).  Wt Readings from Last 3 Encounters:  08/12/14 348 lb 6.4 oz (158.033 kg)  08/05/14 339 lb 4 oz (153.883 kg)  07/06/14 352 lb (159.666 kg)    General: Pleasant. Well developed, well nourished and in no acute distress.  HEENT: Normal. Neck: Supple, no JVD, carotid bruits, or masses noted.  Cardiac: Regular rate and rhythm. No murmurs, rubs, or gallops. No edema.  Respiratory:  Lungs are clear to auscultation bilaterally with normal work of breathing.  GI: Soft and nontender.  MS: No deformity or atrophy. Gait and ROM intact. Skin: Warm and dry. Color is normal.  Neuro:  Strength and sensation are intact and no gross focal deficits noted.  Psych:  Alert, appropriate and with normal affect.   LABORATORY DATA:  EKG:  EKG is ordered today. This demonstrates sinus tachycardia - poor R wave progression - reviewed with Dr. Patty Shaw.  Lab Results  Component Value Date   WBC 7.3 06/19/2012   HGB 13.4 06/19/2012   HCT 41.0 06/19/2012   PLT 240.0 06/19/2012   GLUCOSE 133* 06/09/2014   CHOL 209* 03/16/2014   TRIG 130.0 03/16/2014   HDL 43.90 03/16/2014   LDLDIRECT 155.4 01/19/2011   LDLCALC 139* 03/16/2014   ALT 21 03/16/2014   AST 22 03/16/2014   NA 137 06/09/2014   K 4.1 06/09/2014   CL 101 06/09/2014   CREATININE 0.84 06/09/2014   BUN 11 06/09/2014   CO2 30 06/09/2014   TSH 1.39 11/11/2008   PSA 0.82 06/19/2012   HGBA1C 8.8* 06/09/2014   MICROALBUR 2.9* 06/19/2012    BNP (last 3 results) No results for input(s): BNP in the last 8760 hours.  ProBNP (last 3 results) No results for input(s): PROBNP in the last 8760 hours.   Other Studies Reviewed Today:  CHEST 2 VIEW  COMPARISON: July 06, 2014.  FINDINGS: Stable cardiomediastinal silhouette. No pneumothorax or significant pleural effusion is noted. Stable elevation of right hemidiaphragm is noted with associated subsegmental atelectasis. Left lung is clear. Bony thorax is intact.  IMPRESSION: Stable elevation of right hemidiaphragm with associated subsegmental atelectasis.  Electronically Signed  By: Lupita Raider, M.D.  On: 08/05/2014 16:47   Echo Study Conclusions from May 2016  - Left ventricle: The cavity size was mildly dilated. Wall thickness was increased in a pattern of mild LVH. Systolic function was severely reduced. The estimated ejection fraction was in the range of 20% to 25%. Diffuse hypokinesis. - Pulmonary arteries: Systolic pressure was moderately increased. PA peak pressure: 51 mm Hg (S).  Impressions:  - Technically difficult; definity used; severe global reduction in LV function (EF 20-25); trace TR;  moderately elevated pulmonary pressure.   Assessment/Plan: 1. Dyspnea - ?pneumonia but may have been all CHF right along.   2. Newly diagnosed systolic HF with EF of 20 to 25% - symptomatic - will add Lasix 40 mg daily - take 2 pills today and then one a day. Adding Coreg 3.125 mg BID and Losartan 25 mg daily. Explained the need to have on a CHF regimen with plans to titrate up as tolerated, plan to recheck echo in 3 months with possible ICD. Restrict salt and daily weights. Will need to see back closely.  May need to consider ischemic work up as well. Checking labs today as well.   3. Morbid obesity  4. HTN - adding ARB and beta blocker today. He already has a cough - will not start ACE.   5. IDDM  Current medicines are reviewed with the patient today.  The patient does not have concerns regarding medicines other than what has been noted above.  The following changes have been made:  See above.  Labs/ tests ordered today include:    Orders Placed This Encounter  Procedures  . Brain natriuretic peptide  . Basic metabolic panel  . CBC  . Hepatic function panel  . TSH  . EKG 12-Lead     Disposition:   FU with me in 2 weeks.   Patient is agreeable to this plan and will call if any problems develop in the interim.   Signed: Rosalio Macadamia, RN, ANP-C 08/12/2014 11:34 AM  [facility] [address] [place], [place]  [address] Phone: [phone] Fax: [phone]

## 2014-08-12 NOTE — Patient Instructions (Signed)
We will be checking the following labs today - BNP, BMET, CBC, HPF, and TSH   Medication Instructions:    Continue with your current medicines.   I have added Lasix 40 mg daily - take 2 pills when you get this today and then one each morning  I have added Coreg 3.125 mg to take twice a day - morning and night  I have added Losartan 25 mg to take once a day    Testing/Procedures To Be Arranged:  N/A  Follow-Up:   We will see you back in 2 weeks    Other Special Instructions:   Restrict your salt  Weigh daily  Call the [facility] office at [phone] if you have any questions, problems or concerns.

## 2014-08-26 ENCOUNTER — Encounter: Payer: Self-pay | Admitting: Nurse Practitioner

## 2014-08-26 ENCOUNTER — Ambulatory Visit (INDEPENDENT_AMBULATORY_CARE_PROVIDER_SITE_OTHER): Payer: BC Managed Care – PPO | Admitting: Nurse Practitioner

## 2014-08-26 VITALS — BP 98/72 | HR 94 | Ht 69.5 in | Wt 321.1 lb

## 2014-08-26 DIAGNOSIS — R0602 Shortness of breath: Secondary | ICD-10-CM

## 2014-08-26 DIAGNOSIS — I10 Essential (primary) hypertension: Secondary | ICD-10-CM

## 2014-08-26 DIAGNOSIS — I429 Cardiomyopathy, unspecified: Secondary | ICD-10-CM

## 2014-08-26 LAB — BASIC METABOLIC PANEL
BUN: 16 mg/dL (ref 6–23)
CO2: 31 mEq/L (ref 19–32)
Calcium: 9.7 mg/dL (ref 8.4–10.5)
Chloride: 98 mEq/L (ref 96–112)
Creatinine, Ser: 1.04 mg/dL (ref 0.40–1.50)
GFR: 98.32 mL/min (ref >60.00)
Glucose, Bld: 115 mg/dL — ABNORMAL HIGH (ref 70–99)
Potassium: 4.1 mEq/L (ref 3.5–5.1)
Sodium: 134 mEq/L — ABNORMAL LOW (ref 135–145)

## 2014-08-26 LAB — BRAIN NATRIURETIC PEPTIDE: Pro B Natriuretic peptide (BNP): 112 pg/mL — ABNORMAL HIGH (ref 0.0–100.0)

## 2014-08-26 MED ORDER — FUROSEMIDE 40 MG PO TABS: 20.0000 mg | ORAL_TABLET | Freq: Every day | ORAL | Status: DC

## 2014-08-26 MED ORDER — CARVEDILOL 6.25 MG PO TABS: 6.2500 mg | ORAL_TABLET | Freq: Two times a day (BID) | ORAL | Status: DC

## 2014-08-26 NOTE — Patient Instructions (Addendum)
We will be checking the following labs today - BMET & BNP   Medication Instructions:    Continue with your current medicines but   I am cutting the Lasix back to just 1/2 pill - 20 mg - each day.  I am increasing the Coreg to 6.25 mg twice a day - you can take 2 of your 3.125 mg tabs to = this dose. I have sent in a RX for the 6.25 mg tablets.     Testing/Procedures To Be Arranged:  N/A  Follow-Up:   I will see you back in about 3 weeks    Other Special Instructions:   N/A  Call the [facility] office at [phone] if you have any questions, problems or concerns.

## 2014-08-26 NOTE — Progress Notes (Signed)
CARDIOLOGY OFFICE NOTE  Date:  08/26/2014    Austin Shaw Date of Birth: 05/07/1965 Medical Record #[id number]  PCP:  Crawford Givens, MD  Cardiologist:  Patty Sermons    Chief Complaint  Patient presents with  . Congestive Heart Failure    Follow up visit -seen for Dr. Patty Sermons    History of Present Illness: Austin Shaw is a 49 y.o. male who presents today for a follow up visit. Seen for Dr. Patty Sermons. He has long standing IDDM, HTN, HLD and morbid obesity. Weight is usually around 325 to 330. No prior cardiac history. Has had newly discovered systolic HF.   I saw him earlier this month - got him established with Dr. Patty Sermons. Started him on a HF regimen. Started ARB instead of ACE due to presence of a cough. Explained to him the importance of salt restriction and daily weights.   Comes back today. Here with his wife. He is basically back to his "normal self". His breathing is much better. He is really not short of breath. Has lost almost 30 pounds by his scales and ours. His cough is resolved. Belly not bloated. He is not dizzy. He has no palpitations. He has really had a big lifestyle change with his diet. His only complaint is that he has had some constipation - most likely due to the dietary changes.  Past Medical History  Diagnosis Date  . Diabetes mellitus     type 2  . Hyperlipidemia   . Hypertension   . Morbid obesity     BMI 50    No past surgical history on file.   Medications: Current Outpatient Prescriptions  Medication Sig Dispense Refill  . albuterol (PROVENTIL HFA;VENTOLIN HFA) 108 (90 BASE) MCG/ACT inhaler Inhale 1-2 puffs into the lungs every 6 (six) hours as needed (for cough). 1 Inhaler 1  . furosemide (LASIX) 40 MG tablet Take 0.5 tablets (20 mg total) by mouth daily. 90 tablet 3  . Insulin Glargine (LANTUS SOLOSTAR) 100 UNIT/ML Solostar Pen Inject 20 units at night. Add 2 units per day if AM sugar is >150.If AM sugar 100-150, no change in  dose. If AM sugar <100, decrease 2 units 15 mL   . Insulin Pen Needle 31G X 6 MM MISC Use daily with insulin pen 100 each 12  . loratadine (CLARITIN) 10 MG tablet Take 1 tablet (10 mg total) by mouth daily.    [name] losartan (COZAAR) 25 MG tablet Take 1 tablet (25 mg total) by mouth daily. 30 tablet 3  . metFORMIN (GLUCOPHAGE-XR) 500 MG 24 hr tablet Take 1 tablet (500 mg total) by mouth 2 (two) times daily.    [name] VIAGRA 100 MG tablet Take 100 mg by mouth as needed.     . carvedilol (COREG) 6.25 MG tablet Take 1 tablet (6.25 mg total) by mouth 2 (two) times daily. 60 tablet 3   No current facility-administered medications for this visit.    Allergies: Allergies  Allergen Reactions  . Metformin And Related Other (See Comments)    Max dose  a day.     Social History: The patient  reports that he has never smoked. He has never used smokeless tobacco. He reports that he does not drink alcohol or use illicit drugs.   Family History: The patient's family history includes Congestive Heart Failure in an other family member; Diabetes in his father and another family member.   Review of Systems: Please see the history of present illness.  All other systems are reviewed and negative.   Physical Exam: VS:  BP 98/72 mmHg  Pulse 94  Ht 5' 9.5" (1.765 m)  Wt 321 lb 1.9 oz (145.659 kg)  BMI 46.76 kg/m2  SpO2 99% .  BMI Body mass index is 46.76 kg/(m^2).  Wt Readings from Last 3 Encounters:  08/26/14 321 lb 1.9 oz (145.659 kg)  08/12/14 348 lb 6.4 oz (158.033 kg)  08/05/14 339 lb 4 oz (153.883 kg)    General: Pleasant. Well developed, well nourished and in no acute distress. He is obese but his weight is down 27 pounds.  HEENT: Normal. Neck: Supple, no JVD, carotid bruits, or masses noted.  Cardiac: Regular rate and rhythm. Remains tachycardic. No clear cut S3 today.  No edema.  Respiratory:  Lungs are clear to auscultation bilaterally with normal work of breathing.  GI: Soft and  nontender.  MS: No deformity or atrophy. Gait and ROM intact. Skin: Warm and dry. Color is normal.  Neuro:  Strength and sensation are intact and no gross focal deficits noted.  Psych: Alert, appropriate and with normal affect.   LABORATORY DATA:  EKG:  EKG is not ordered today.   Lab Results  Component Value Date   WBC 7.2 08/12/2014   HGB 13.0 08/12/2014   HCT 40.0 08/12/2014   PLT 211.0 08/12/2014   GLUCOSE 159* 08/12/2014   CHOL 209* 03/16/2014   TRIG 130.0 03/16/2014   HDL 43.90 03/16/2014   LDLDIRECT 155.4 01/19/2011   LDLCALC 139* 03/16/2014   ALT 30 08/12/2014   AST 25 08/12/2014   NA 137 08/12/2014   K 4.2 08/12/2014   CL 102 08/12/2014   CREATININE 0.87 08/12/2014   BUN 11 08/12/2014   CO2 30 08/12/2014   TSH 1.34 08/12/2014   PSA 0.82 06/19/2012   HGBA1C 8.8* 06/09/2014   MICROALBUR 2.9* 06/19/2012    BNP (last 3 results) No results for input(s): BNP in the last 8760 hours.  ProBNP (last 3 results)  Recent Labs  08/12/14 1129  PROBNP 410.0*     Other Studies Reviewed Today:  CHEST 2 VIEW  COMPARISON: July 06, 2014.  FINDINGS: Stable cardiomediastinal silhouette. No pneumothorax or significant pleural effusion is noted. Stable elevation of right hemidiaphragm is noted with associated subsegmental atelectasis. Left lung is clear. Bony thorax is intact.  IMPRESSION: Stable elevation of right hemidiaphragm with associated subsegmental atelectasis.  Electronically Signed  By: Lupita Raider, M.D.  On: 08/05/2014 16:47   Echo Study Conclusions from May 2016  - Left ventricle: The cavity size was mildly dilated. Wall thickness was increased in a pattern of mild LVH. Systolic function was severely reduced. The estimated ejection fraction was in the range of 20% to 25%. Diffuse hypokinesis. - Pulmonary arteries: Systolic pressure was moderately increased. PA peak pressure: 51 mm Hg (S).  Impressions:  - Technically  difficult; definity used; severe global reduction in LV function (EF 20-25); trace TR; moderately elevated pulmonary pressure.   Assessment/Plan: 1. Dyspnea - ?pneumonia but may have been all CHF right along. His symptoms have now resolved.   2. Newly diagnosed systolic HF with EF of 20 to 25% - back to NYHA I to II. Great response to Lasix. Will recheck lab today. I have cut his Lasix back to 20 mg a day. Increasing Coreg to 6.25 mg BID. Continue ARB. See back in about 3 weeks. Continue with salt restriction and daily weights.  3. Morbid obesity   4. HTN - BP  has improved quite nicely.   5. IDDM  Current medicines are reviewed with the patient today.  The patient does not have concerns regarding medicines other than what has been noted above.  The following changes have been made:  See above.  Labs/ tests ordered today include:    Orders Placed This Encounter  Procedures  . Brain natriuretic peptide  . Basic metabolic panel     Disposition:   FU with me in 3  weeks.   Patient is agreeable to this plan and will call if any problems develop in the interim.   Signed: Rosalio Macadamia, RN, ANP-C 08/26/2014 3:45 PM  [facility] [address] [place], [place]  [address] Phone: [phone] Fax: [phone]

## 2014-08-31 ENCOUNTER — Encounter: Payer: Self-pay | Admitting: Nurse Practitioner

## 2014-08-31 ENCOUNTER — Telehealth: Payer: Self-pay | Admitting: Nurse Practitioner

## 2014-08-31 NOTE — Telephone Encounter (Signed)
New Message      Pt's wife calling stating that pt has to do inventory for work on the morning of his appt w/ Lori on 09/14/14 and will be unable to come to this appt. I offered pt's wife Lori's next available appt which is 10/08/14 and pt's wife stated that Lawson Fiscal wanted to see the pt the week of 09/14/14 and wants to know if he can be work into her schedule. Please call back and advise.

## 2014-08-31 NOTE — Telephone Encounter (Signed)
S/w re-schedule appointment due to work schedule.  Appointment re-scheduled and pt agreeable.

## 2014-09-03 ENCOUNTER — Other Ambulatory Visit (INDEPENDENT_AMBULATORY_CARE_PROVIDER_SITE_OTHER): Payer: BC Managed Care – PPO

## 2014-09-03 DIAGNOSIS — IMO0002 Reserved for concepts with insufficient information to code with codable children: Secondary | ICD-10-CM

## 2014-09-03 DIAGNOSIS — E118 Type 2 diabetes mellitus with unspecified complications: Secondary | ICD-10-CM | POA: Diagnosis not present

## 2014-09-03 DIAGNOSIS — E1165 Type 2 diabetes mellitus with hyperglycemia: Secondary | ICD-10-CM

## 2014-09-03 LAB — LIPID PANEL
Cholesterol: 207 mg/dL — ABNORMAL HIGH (ref 0–200)
HDL: 47.2 mg/dL (ref >39.00)
LDL Cholesterol: 140 mg/dL — ABNORMAL HIGH (ref 0–99)
NonHDL: 159.8
Total CHOL/HDL Ratio: 4
Triglycerides: 101 mg/dL (ref 0.0–149.0)
VLDL: 20.2 mg/dL (ref 0.0–40.0)

## 2014-09-03 LAB — HEMOGLOBIN A1C: Hgb A1c MFr Bld: 7.5 % — ABNORMAL HIGH (ref 4.6–6.5)

## 2014-09-03 LAB — MICROALBUMIN / CREATININE URINE RATIO
Creatinine,U: 402.1 mg/dL
Microalb Creat Ratio: 3.9 mg/g (ref 0.0–30.0)
Microalb, Ur: 15.8 mg/dL — ABNORMAL HIGH (ref 0.0–1.9)

## 2014-09-08 ENCOUNTER — Encounter: Payer: Self-pay | Admitting: Family Medicine

## 2014-09-08 ENCOUNTER — Encounter: Payer: Self-pay | Admitting: Nurse Practitioner

## 2014-09-08 ENCOUNTER — Ambulatory Visit (INDEPENDENT_AMBULATORY_CARE_PROVIDER_SITE_OTHER): Payer: BC Managed Care – PPO | Admitting: Family Medicine

## 2014-09-08 VITALS — BP 130/80 | HR 91 | Temp 98.7°F | Wt 315.5 lb

## 2014-09-08 DIAGNOSIS — E1165 Type 2 diabetes mellitus with hyperglycemia: Secondary | ICD-10-CM

## 2014-09-08 DIAGNOSIS — E118 Type 2 diabetes mellitus with unspecified complications: Secondary | ICD-10-CM

## 2014-09-08 DIAGNOSIS — IMO0002 Reserved for concepts with insufficient information to code with codable children: Secondary | ICD-10-CM

## 2014-09-08 DIAGNOSIS — I429 Cardiomyopathy, unspecified: Secondary | ICD-10-CM | POA: Diagnosis not present

## 2014-09-08 DIAGNOSIS — Z23 Encounter for immunization: Secondary | ICD-10-CM | POA: Diagnosis not present

## 2014-09-08 MED ORDER — CARVEDILOL 6.25 MG PO TABS: 3.1250 mg | ORAL_TABLET | Freq: Two times a day (BID) | ORAL | Status: DC

## 2014-09-08 NOTE — Patient Instructions (Signed)
Keep taking coreg 3.125mg  twice a day.  Stay off insulin for now.  Recheck labs in about 3 months before a visit.  I'll await the cardiology notes.  Keep working on your weight.  We'll likely address your cholesterol later on.  If your sugars start to increase (consistently >130 in the AM), then let me know.   Take care.  Glad to see you.

## 2014-09-08 NOTE — Progress Notes (Signed)
Pre visit review using our clinic review tool, if applicable. No additional management support is needed unless otherwise documented below in the visit note.  Hypertension:    Using medication without problems or lightheadedness:  Trouble with lightheadedness with coreg 6.25 BID.  No sx with 6.25mg  total per day Chest pain with exertion:no Edema:no Short of breath:no His exercise tolerance is much improved, walking 5-7 miles per day now  Prev echo d/w pt.    Diabetes:  Using medications without difficulties: missing some nighttime insulin doses recently, no insulin in last 10-14 days.  Hypoglycemic episodes: only if prolonged fasting Hyperglycemic episodes: no Feet problems: no Blood Sugars averaging: now 112-118 in AM  He is on salt restricted diet.  Weight is way down, noted, d/w pt.    PMH and SH reviewed  Meds, vitals, and allergies reviewed.   ROS: See HPI.  Otherwise negative.    GEN: nad, alert and oriented HEENT: mucous membranes moist NECK: supple w/o LA CV: rrr. PULM: ctab, no inc wob ABD: soft, +bs EXT: no edema SKIN: no acute rash

## 2014-09-09 ENCOUNTER — Other Ambulatory Visit: Payer: Self-pay | Admitting: *Deleted

## 2014-09-09 ENCOUNTER — Telehealth: Payer: Self-pay | Admitting: *Deleted

## 2014-09-09 MED ORDER — CARVEDILOL 3.125 MG PO TABS: 3.1250 mg | ORAL_TABLET | Freq: Two times a day (BID) | ORAL | Status: DC

## 2014-09-09 NOTE — Telephone Encounter (Signed)
S/w pt is aware to decrease Coreg ( 3.125) bid.  Medication changed in chart.

## 2014-09-10 NOTE — Assessment & Plan Note (Signed)
Much improved. On ARB for MALB.  D/w pt.  Stay off insulin for now.  Recheck labs in about 3 months before a visit.  Keep working on weight.  We'll likely address cholesterol later on.  If your sugars start to increase (consistently >130 in the AM), then he'll let me know.  >25 minutes spent in face to face time with patient, >50% spent in counselling or coordination of care.

## 2014-09-10 NOTE — Assessment & Plan Note (Signed)
Keep taking coreg 3.125mg  twice a day for now.  I'll await the cardiology notes.  Path/phys d/w pt.  Keep working on weight.  We'll likely address cholesterol later on.  He agrees with plan.

## 2014-09-14 ENCOUNTER — Encounter: Payer: Self-pay | Admitting: *Deleted

## 2014-09-14 ENCOUNTER — Ambulatory Visit: Payer: BC Managed Care – PPO | Admitting: Nurse Practitioner

## 2014-09-16 ENCOUNTER — Encounter: Payer: Self-pay | Admitting: Nurse Practitioner

## 2014-09-16 ENCOUNTER — Ambulatory Visit (INDEPENDENT_AMBULATORY_CARE_PROVIDER_SITE_OTHER): Payer: BC Managed Care – PPO | Admitting: Nurse Practitioner

## 2014-09-16 VITALS — BP 126/80 | HR 88 | Ht 69.5 in | Wt 315.4 lb

## 2014-09-16 DIAGNOSIS — I10 Essential (primary) hypertension: Secondary | ICD-10-CM

## 2014-09-16 DIAGNOSIS — I429 Cardiomyopathy, unspecified: Secondary | ICD-10-CM

## 2014-09-16 MED ORDER — FUROSEMIDE 40 MG PO TABS: 20.0000 mg | ORAL_TABLET | Freq: Every day | ORAL | Status: DC | PRN

## 2014-09-16 MED ORDER — LOSARTAN POTASSIUM 25 MG PO TABS: 25.0000 mg | ORAL_TABLET | Freq: Two times a day (BID) | ORAL | Status: DC

## 2014-09-16 NOTE — Patient Instructions (Addendum)
We will be checking the following labs today - NONE   Medication Instructions:    Continue with your current medicines but   I am changing your Lasix to "just as needed" for swelling, weight gain, etc  I am increasing the Losartan to 25 mg twice a day    Testing/Procedures To Be Arranged:  N/A  Follow-Up:   I will see you back in 4 weeks.   Follow up with Dr. Patty Sermons in 8 weeks after echo    Other Special Instructions:   Echo after 11/10/14  Call the [facility] office at [phone] if you have any questions, problems or concerns.

## 2014-09-16 NOTE — Progress Notes (Signed)
CARDIOLOGY OFFICE NOTE  Date:  09/16/2014    Austin Shaw Date of Birth: [birth year] Medical Record #[id number]  PCP:  Austin Givens, MD  Cardiologist:  Austin Shaw    Chief Complaint  Patient presents with  . Congestive Heart Failure    Follow up visit - seen for Dr. Patty Shaw    History of Present Illness: Austin Shaw is a 49 y.o. male who presents today for a follow up visit. Seen for Dr. Patty Shaw. He has long standing IDDM, HTN, HLD and morbid obesity. Weight is usually around 325 to 330. No prior cardiac history. Has had newly discovered systolic HF.   I saw him last month - got him established with Dr. Patty Shaw. Started him on a HF regimen. Started ARB instead of ACE due to presence of a cough. Explained to him the importance of salt restriction and daily weights.   When seen backin follow up - he had improved dramatically. Weight down 30 pounds. Feeling back to normal. Tried to increase his Coreg up but he did not tolerate.   Comes back today. Here with his wife. It's their anniversary today - 18 years. Down 6 more pounds. Doing great. Walking 5 to 7 miles a day. Now off his insulin. Not short of breath. No swelling. No PND. No palpitations.   Past Medical History  Diagnosis Date  . Diabetes mellitus     type 2  . Hyperlipidemia   . Hypertension   . Morbid obesity     BMI 50    Past Surgical History  Procedure Laterality Date  . None       Medications: Current Outpatient Prescriptions  Medication Sig Dispense Refill  . carvedilol (COREG) 3.125 MG tablet Take 1 tablet (3.125 mg total) by mouth 2 (two) times daily.    . furosemide (LASIX) 40 MG tablet Take 0.5 tablets (20 mg total) by mouth daily. 90 tablet 3  . losartan (COZAAR) 25 MG tablet Take 1 tablet (25 mg total) by mouth daily. 30 tablet 3  . metFORMIN (GLUCOPHAGE-XR) 500 MG 24 hr tablet Take 1 tablet (500 mg total) by mouth 2 (two) times daily.    [name] VIAGRA 100 MG tablet Take 100 mg by  mouth as needed.      No current facility-administered medications for this visit.    Allergies: Allergies  Allergen Reactions  . Metformin And Related Other (See Comments)    Max dose 1000mg  a day.     Social History: The patient  reports that he has never smoked. He has never used smokeless tobacco. He reports that he does not drink alcohol or use illicit drugs.   Family History: The patient's family history includes Congestive Heart Failure in an other family member; Diabetes in his father and another family member.   Review of Systems: Please see the history of present illness.   Otherwise, the review of systems is positive for none.   All other systems are reviewed and negative.   Physical Exam: VS:  BP 126/80 mmHg  Pulse 88  Ht 5' 9.5" (1.765 m)  Wt 315 lb 6.4 oz (143.065 kg)  BMI 45.92 kg/m2  SpO2 98% .  BMI Body mass index is 45.92 kg/(m^2).  Wt Readings from Last 3 Encounters:  09/16/14 315 lb 6.4 oz (143.065 kg)  09/08/14 315 lb 8 oz (143.11 kg)  08/26/14 321 lb 1.9 oz (145.659 kg)    General: Pleasant. Well developed, well nourished and in no acute distress.  HEENT: Normal. Neck: Supple, no JVD, carotid bruits, or masses noted.  Cardiac: Regular rate and rhythm. No murmurs, rubs, or gallops. I do not appreciate a S3 today. No edema.  Respiratory:  Lungs are clear to auscultation bilaterally with normal work of breathing.  GI: Soft and nontender.  MS: No deformity or atrophy. Gait and ROM intact. Skin: Warm and dry. Color is normal.  Neuro:  Strength and sensation are intact and no gross focal deficits noted.  Psych: Alert, appropriate and with normal affect.   LABORATORY DATA:  EKG:  EKG is not ordered today.  Lab Results  Component Value Date   WBC 7.2 08/12/2014   HGB 13.0 08/12/2014   HCT 40.0 08/12/2014   PLT 211.0 08/12/2014   GLUCOSE 115* 08/26/2014   CHOL 207* 09/03/2014   TRIG 101.0 09/03/2014   HDL 47.20 09/03/2014   LDLDIRECT 155.4  01/19/2011   LDLCALC 140* 09/03/2014   ALT 30 08/12/2014   AST 25 08/12/2014   NA 134* 08/26/2014   K 4.1 08/26/2014   CL 98 08/26/2014   CREATININE 1.04 08/26/2014   BUN 16 08/26/2014   CO2 31 08/26/2014   TSH 1.34 08/12/2014   PSA 0.82 06/19/2012   HGBA1C 7.5* 09/03/2014   MICROALBUR 15.8* 09/03/2014    BNP (last 3 results) No results for input(s): BNP in the last 8760 hours.  ProBNP (last 3 results)  Recent Labs  08/12/14 1129 08/26/14 1545  PROBNP 410.0* 112.0*     Other Studies Reviewed Today:  Echo Study Conclusions from May 2016  - Left ventricle: The cavity size was mildly dilated. Wall thickness was increased in a pattern of mild LVH. Systolic function was severely reduced. The estimated ejection fraction was in the range of 20% to 25%. Diffuse hypokinesis. - Pulmonary arteries: Systolic pressure was moderately increased. PA peak pressure: 51 mm Hg (S).  Impressions:  - Technically difficult; definity used; severe global reduction in LV function (EF 20-25); trace TR; moderately elevated pulmonary pressure.   Assessment/Plan:   1. Dyspnea - ?pneumonia but may have been all CHF right along. His symptoms have now resolved.   2. Newly diagnosed systolic HF with EF of 20 to 25% - back to NYHA I. Did not tolerate increase in Coreg. Will try to increase his Losartan to 25 mg BID.  See back in about 4 weeks with lab. Changing lasix to just prn. Will need echo after 11/10/14. Continue with salt restriction and daily weights.  3. Morbid obesity   4. HTN - BP has improved quite nicely.   5. IDDM - now off insulin.   Current medicines are reviewed with the patient today.  The patient does not have concerns regarding medicines other than what has been noted above.  The following changes have been made:  See above.  Labs/ tests ordered today include:   No orders of the defined types were placed in this encounter.     Disposition:   FU with me  in 4 weeks.   Patient is agreeable to this plan and will call if any problems develop in the interim.   Signed: Rosalio Macadamia, RN, ANP-C 09/16/2014 8:25 AM  [facility] [address] [place], [place]  [address] Phone: [phone] Fax: [phone]

## 2014-10-09 ENCOUNTER — Encounter: Payer: Self-pay | Admitting: Primary Care

## 2014-10-09 ENCOUNTER — Other Ambulatory Visit: Payer: Self-pay | Admitting: Family Medicine

## 2014-10-09 MED ORDER — FUROSEMIDE 40 MG PO TABS: 20.0000 mg | ORAL_TABLET | Freq: Every day | ORAL | Status: DC | PRN

## 2014-10-15 ENCOUNTER — Encounter: Payer: Self-pay | Admitting: Nurse Practitioner

## 2014-10-15 ENCOUNTER — Ambulatory Visit (INDEPENDENT_AMBULATORY_CARE_PROVIDER_SITE_OTHER): Payer: BC Managed Care – PPO | Admitting: Nurse Practitioner

## 2014-10-15 VITALS — BP 144/100 | HR 128 | Ht 69.0 in | Wt 312.8 lb

## 2014-10-15 DIAGNOSIS — I10 Essential (primary) hypertension: Secondary | ICD-10-CM

## 2014-10-15 DIAGNOSIS — I429 Cardiomyopathy, unspecified: Secondary | ICD-10-CM

## 2014-10-15 LAB — BASIC METABOLIC PANEL
BUN: 12 mg/dL (ref 6–23)
CO2: 28 mEq/L (ref 19–32)
Calcium: 9.6 mg/dL (ref 8.4–10.5)
Chloride: 103 mEq/L (ref 96–112)
Creatinine, Ser: 0.85 mg/dL (ref 0.40–1.50)
GFR: 124.02 mL/min (ref >60.00)
Glucose, Bld: 112 mg/dL — ABNORMAL HIGH (ref 70–99)
Potassium: 4.5 mEq/L (ref 3.5–5.1)
Sodium: 138 mEq/L (ref 135–145)

## 2014-10-15 MED ORDER — CARVEDILOL 3.125 MG PO TABS: 3.1250 mg | ORAL_TABLET | Freq: Two times a day (BID) | ORAL | Status: DC

## 2014-10-15 NOTE — Patient Instructions (Addendum)
We will be checking the following labs today - BMET   Medication Instructions:    Continue with your current medicines.   Let's try to increase the Coreg to 6.25 at night and 3.125 in the AM. If you do well with this for a week or so - then increase to 6.25 twice a day    Testing/Procedures To Be Arranged:  Echo as planned  Follow-Up:   See Dr. Patty Sermons as planned.    Other Special Instructions:   N/A  Call the [facility] office at [phone] if you have any questions, problems or concerns.

## 2014-10-15 NOTE — Progress Notes (Signed)
CARDIOLOGY OFFICE NOTE  Date:  10/15/2014    Austin Shaw Date of Birth: 11-Dec-1965 Medical Record #[id number]  PCP:  Crawford Givens, MD  Cardiologist:  Patty Sermons    Chief Complaint  Patient presents with  . Congestive Heart Failure    Follow up visit - seen for Dr. Patty Sermons    History of Present Illness: Austin Shaw is a 49 y.o. male who presents today for a follow up visit. Seen for Dr. Patty Sermons. He has long standing IDDM, HTN, HLD and morbid obesity. Weight is usually around 325 to 330. No prior cardiac history. Has had newly discovered systolic HF.   I saw him back in May - got him established with Dr. Patty Sermons. Started him on a HF regimen. Started ARB instead of ACE due to presence of a cough. Explained to him the importance of salt restriction and daily weights.   When seen backin follow up - he had improved dramatically. Weight down already 30 pounds. Feeling back to normal. Tried to increase his Coreg up but he did not tolerate. Increased ARB at last visit.   Comes back today. Here with his wife. Continues to do well. Not short of breath. No swelling. Feels good on his medicines. HR is up today. BP higher as well. He says he is quite nervous about having to have lab today. No palpitations. Not dizzy or lightheaded. Weight down a few more pounds.   Past Medical History  Diagnosis Date  . Diabetes mellitus     type 2  . Hyperlipidemia   . Hypertension   . Morbid obesity     BMI 50    Past Surgical History  Procedure Laterality Date  . None       Medications: Current Outpatient Prescriptions  Medication Sig Dispense Refill  . carvedilol (COREG) 3.125 MG tablet Take 1 tablet (3.125 mg total) by mouth 2 (two) times daily.    . furosemide (LASIX) 40 MG tablet Take 0.5 tablets (20 mg total) by mouth daily as needed for fluid or edema. 30 tablet 1  . losartan (COZAAR) 25 MG tablet Take 1 tablet (25 mg total) by mouth 2 (two) times daily. 60 tablet 6    . metFORMIN (GLUCOPHAGE-XR) 500 MG 24 hr tablet Take 1 tablet (500 mg total) by mouth 2 (two) times daily.    [name] VIAGRA 100 MG tablet Take 100 mg by mouth as needed.      No current facility-administered medications for this visit.    Allergies: Allergies  Allergen Reactions  . Metformin And Related Other (See Comments)    Max dose 1000mg  a day.     Social History: The patient  reports that he has never smoked. He has never used smokeless tobacco. He reports that he does not drink alcohol or use illicit drugs.   Family History: The patient's family history includes Congestive Heart Failure in an other family member; Diabetes in his father and another family member.   Review of Systems: Please see the history of present illness.   Otherwise, the review of systems is positive for none.   All other systems are reviewed and negative.   Physical Exam: VS:  BP 144/100 mmHg  Pulse 128  Ht 5\' 9"  (1.753 m)  Wt 312 lb 12.8 oz (141.885 kg)  BMI 46.17 kg/m2  SpO2 95% .  BMI Body mass index is 46.17 kg/(m^2).  Wt Readings from Last 3 Encounters:  10/15/14 312 lb 12.8 oz (141.885 kg)  09/16/14 315 lb 6.4 oz (143.065 kg)  09/08/14 315 lb 8 oz (143.11 kg)    General: Pleasant. Well developed, well nourished and in no acute distress. He is obese. Weight is down 3 pounds.   HEENT: Normal. Neck: Supple, no JVD, carotid bruits, or masses noted.  Cardiac: He is tachycardic today. Has a gallop on exam. No edema.  Respiratory:  Lungs are clear to auscultation bilaterally with normal work of breathing.  GI: Soft and nontender.  MS: No deformity or atrophy. Gait and ROM intact. Skin: Warm and dry. Color is normal.  Neuro:  Strength and sensation are intact and no gross focal deficits noted.  Psych: Alert, appropriate and with normal affect.   LABORATORY DATA:  EKG:  EKG is ordered today. Reviewed with Dr. Patty Sermons. Felt to be a sinus mechanism but with a lower atrial focus.   Lab Results   Component Value Date   WBC 7.2 08/12/2014   HGB 13.0 08/12/2014   HCT 40.0 08/12/2014   PLT 211.0 08/12/2014   GLUCOSE 115* 08/26/2014   CHOL 207* 09/03/2014   TRIG 101.0 09/03/2014   HDL 47.20 09/03/2014   LDLDIRECT 155.4 01/19/2011   LDLCALC 140* 09/03/2014   ALT 30 08/12/2014   AST 25 08/12/2014   NA 134* 08/26/2014   K 4.1 08/26/2014   CL 98 08/26/2014   CREATININE 1.04 08/26/2014   BUN 16 08/26/2014   CO2 31 08/26/2014   TSH 1.34 08/12/2014   PSA 0.82 06/19/2012   HGBA1C 7.5* 09/03/2014   MICROALBUR 15.8* 09/03/2014    BNP (last 3 results) No results for input(s): BNP in the last 8760 hours.  ProBNP (last 3 results)  Recent Labs  08/12/14 1129 08/26/14 1545  PROBNP 410.0* 112.0*     Other Studies Reviewed Today: Echo Study Conclusions from May 2016  - Left ventricle: The cavity size was mildly dilated. Wall thickness was increased in a pattern of mild LVH. Systolic function was severely reduced. The estimated ejection fraction was in the range of 20% to 25%. Diffuse hypokinesis. - Pulmonary arteries: Systolic pressure was moderately increased. PA peak pressure: 51 mm Hg (S).  Impressions:  - Technically difficult; definity used; severe global reduction in LV function (EF 20-25); trace TR; moderately elevated pulmonary pressure.   Assessment/Plan:   1. Dyspnea - ?pneumonia but may have been all CHF right along. His symptoms have now resolved.   2. Newly diagnosed systolic HF with EF of 20 to 25% - back to NYHA I. Did not tolerate increase in Coreg.  Has echo planned for after 11/10/14 with followup with Dr. Patty Sermons later in August. Continue with salt restriction and daily weights. He is willing to try and increase his Coreg again - will try 6.25 at night and continue 3.125 in the AM - if he does well on this dose over the next week or so, he will try to increase to 6.25 twice a day.   3. Morbid obesity - actively losing weight  4. HTN -  BP up today. Will try to increase his Coreg.  5. IDDM - now off insulin.   6. Tachycardia - will try to increase Coreg again. EKG reviewed with Dr. Patty Sermons.   Current medicines are reviewed with the patient today.  The patient does not have concerns regarding medicines other than what has been noted above.  The following changes have been made:  See above.  Labs/ tests ordered today include:    Orders Placed This Encounter  Procedures  . Basic metabolic panel     Disposition:   FU with Dr. Patty Sermons as planned.    Patient is agreeable to this plan and will call if any problems develop in the interim.   Signed: Rosalio Macadamia, RN, ANP-C 10/15/2014 8:18 AM  [facility] [address] [place], [place]  [address] Phone: [phone] Fax: [phone]

## 2014-11-12 ENCOUNTER — Other Ambulatory Visit: Payer: Self-pay

## 2014-11-12 ENCOUNTER — Ambulatory Visit (HOSPITAL_COMMUNITY): Payer: BC Managed Care – PPO | Attending: Cardiovascular Disease

## 2014-11-12 DIAGNOSIS — E785 Hyperlipidemia, unspecified: Secondary | ICD-10-CM | POA: Insufficient documentation

## 2014-11-12 DIAGNOSIS — I10 Essential (primary) hypertension: Secondary | ICD-10-CM | POA: Diagnosis not present

## 2014-11-12 DIAGNOSIS — I429 Cardiomyopathy, unspecified: Secondary | ICD-10-CM | POA: Diagnosis not present

## 2014-11-12 DIAGNOSIS — E119 Type 2 diabetes mellitus without complications: Secondary | ICD-10-CM | POA: Insufficient documentation

## 2014-11-12 DIAGNOSIS — I517 Cardiomegaly: Secondary | ICD-10-CM | POA: Diagnosis not present

## 2014-11-12 DIAGNOSIS — Z6841 Body Mass Index (BMI) 40.0 and over, adult: Secondary | ICD-10-CM | POA: Diagnosis not present

## 2014-12-04 ENCOUNTER — Ambulatory Visit (INDEPENDENT_AMBULATORY_CARE_PROVIDER_SITE_OTHER): Payer: BC Managed Care – PPO | Admitting: Cardiology

## 2014-12-04 ENCOUNTER — Encounter: Payer: Self-pay | Admitting: Cardiology

## 2014-12-04 VITALS — BP 140/90 | HR 98 | Ht 69.0 in | Wt 324.0 lb

## 2014-12-04 DIAGNOSIS — I10 Essential (primary) hypertension: Secondary | ICD-10-CM | POA: Diagnosis not present

## 2014-12-04 DIAGNOSIS — I429 Cardiomyopathy, unspecified: Secondary | ICD-10-CM

## 2014-12-04 NOTE — Patient Instructions (Signed)
Medication Instructions:  Your physician recommends that you continue on your current medications as directed. Please refer to the Current Medication list given to you today.  Labwork: none  Testing/Procedures: none  Follow-Up: Your physician recommends that you schedule a follow-up appointment in: 3 month ov/ekg   Any Other Special Instructions Will Be Listed Below (If Applicable). Work harder on diet and exercise

## 2014-12-04 NOTE — Progress Notes (Signed)
Cardiology Office Note   Date:  12/04/2014   ID:  Austin Shaw 09-24-1965, MRN [id number]  PCP:  Crawford Givens, MD  Cardiologist: Cassell Clement MD  Chief Complaint  Patient presents with  . Cardiomyopathy      History of Present Illness: Austin Shaw is a 49 y.o. male who presents for scheduled follow-up office visit.  This gentleman has a history of chronic systolic heart failure.  His echocardiogram on 08/10/14 showed severe left ventricular dysfunction with an ejection fraction of 20-25%.  He was subsequently placed on beta blocker and ARB.  He returns now for follow-up.  A follow-up echocardiogram on 11/12/14 shows significant improvement with ejection fraction now up to 50-55% with grade 1 diastolic dysfunction.He has long standing IDDM, HTN, HLD and morbid obesity. Since last visit he has gained weight.  His weight is up 12 pounds since July.  He has just come off a vacation.  They went to [facility] to see the graduation of their son from the [employer].  He has not been exercising regularly and he has been off his careful diet. He denies any chest pain or shortness of breath.  No dizziness or syncope.  No palpitations or tachycardia. He reports that when he tried to increase his carvedilol he felt bad and dizzy and was not able to tolerate a higher dose. The patient wears a foot bed.  Typically he walks at least 10,000 steps a day. The patient has had some recent constipation problems and will be trying the MiraLAX that had previously been recommended.  Past Medical History  Diagnosis Date  . Diabetes mellitus     type 2  . Hyperlipidemia   . Hypertension   . Morbid obesity     BMI 50    Past Surgical History  Procedure Laterality Date  . None       Current Outpatient Prescriptions  Medication Sig Dispense Refill  . carvedilol (COREG) 3.125 MG tablet Take 1 tablet (3.125 mg total) by mouth 2 (two) times daily.    . furosemide  (LASIX) 40 MG tablet Take 0.5 tablets (20 mg total) by mouth daily as needed for fluid or edema. 30 tablet 1  . losartan (COZAAR) 25 MG tablet Take 1 tablet (25 mg total) by mouth 2 (two) times daily. 60 tablet 6  . metFORMIN (GLUCOPHAGE-XR) 500 MG 24 hr tablet Take 1 tablet (500 mg total) by mouth 2 (two) times daily.    [name] VIAGRA 100 MG tablet Take 100 mg by mouth as needed.      No current facility-administered medications for this visit.    Allergies:   Metformin and related    Social History:  The patient  reports that he has never smoked. He has never used smokeless tobacco. He reports that he does not drink alcohol or use illicit drugs.   Family History:  The patient's family history includes Congestive Heart Failure in an other family member; Diabetes in his father and another family member.    ROS:  Please see the history of present illness.   Otherwise, review of systems are positive for none.   All other systems are reviewed and negative.    PHYSICAL EXAM: VS:  BP 140/90 mmHg  Pulse 98  Ht  (1.753 m)  Wt 324 lb (146.965 kg)  BMI 47.82 kg/m2  SpO2 97% , BMI Body mass index is 47.82 kg/(m^2). GEN: Well nourished, well developed, in no acute distress  HEENT: normal Neck: no JVD, carotid bruits, or masses Cardiac: RRR; no murmurs, rubs, or gallops,no edema  Respiratory:  clear to auscultation bilaterally, normal work of breathing GI: soft, nontender, nondistended, + BS MS: no deformity or atrophy Skin: warm and dry, no rash Neuro:  Strength and sensation are intact Psych: euthymic mood, full affect   EKG:  EKG is not ordered today.    Recent Labs: 08/12/2014: ALT 30; Hemoglobin 13.0; Platelets 211.0; TSH 1.34 08/26/2014: Pro B Natriuretic peptide (BNP) 112.0* 10/15/2014: BUN 12; Creatinine, Ser 0.85; Potassium 4.5; Sodium 138    Lipid Panel    Component Value Date/Time   CHOL 207* 09/03/2014 0804   TRIG 101.0 09/03/2014 0804   HDL 47.20 09/03/2014 0804    CHOLHDL 4 09/03/2014 0804   VLDL 20.2 09/03/2014 0804   LDLCALC 140* 09/03/2014 0804   LDLDIRECT 155.4 01/19/2011 0908      Wt Readings from Last 3 Encounters:  12/04/14 324 lb (146.965 kg)  10/15/14 312 lb 12.8 oz (141.885 kg)  09/16/14 315 lb 6.4 oz (143.065 kg)        ASSESSMENT AND PLAN:        1.  Chronic systolic heart failure, improved 2.  Diabetes mellitus 3.  Essential hypertension 4.  Morbid obesity 5.  Hyperlipidemia 6.  Weight gain secondary to dietary noncompliance.  No evidence of fluid overload   Current medicines are reviewed at length with the patient today.  The patient does not have concerns regarding medicines.  The following changes have been made:  no change  Labs/ tests ordered today include:  No orders of the defined types were placed in this encounter.     Disposition: The patient is going to get back on his careful diet and exercise regimen.  We will continue current medication.  He will be seeing Dr. Para March next month concerning his diabetes and his hyperlipidemia.  We will plan to see him in 3 months for follow-up office visit and EKG  Signed, Cassell Clement MD 12/04/2014 8:28 AM    [facility] [address] [place], [place], [place]  [address] Phone: [phone]; Fax: [phone]

## 2014-12-09 ENCOUNTER — Other Ambulatory Visit: Payer: Self-pay | Admitting: Family Medicine

## 2014-12-09 DIAGNOSIS — IMO0002 Reserved for concepts with insufficient information to code with codable children: Secondary | ICD-10-CM

## 2014-12-09 DIAGNOSIS — E118 Type 2 diabetes mellitus with unspecified complications: Principal | ICD-10-CM

## 2014-12-09 DIAGNOSIS — E1165 Type 2 diabetes mellitus with hyperglycemia: Secondary | ICD-10-CM

## 2014-12-10 ENCOUNTER — Other Ambulatory Visit (INDEPENDENT_AMBULATORY_CARE_PROVIDER_SITE_OTHER): Payer: BC Managed Care – PPO

## 2014-12-10 DIAGNOSIS — E118 Type 2 diabetes mellitus with unspecified complications: Secondary | ICD-10-CM | POA: Diagnosis not present

## 2014-12-10 DIAGNOSIS — E1165 Type 2 diabetes mellitus with hyperglycemia: Secondary | ICD-10-CM

## 2014-12-10 DIAGNOSIS — IMO0002 Reserved for concepts with insufficient information to code with codable children: Secondary | ICD-10-CM

## 2014-12-10 LAB — HEMOGLOBIN A1C: Hgb A1c MFr Bld: 7.4 % — ABNORMAL HIGH (ref 4.6–6.5)

## 2014-12-10 LAB — BASIC METABOLIC PANEL
BUN: 19 mg/dL (ref 6–23)
CO2: 30 mEq/L (ref 19–32)
Calcium: 9.7 mg/dL (ref 8.4–10.5)
Chloride: 102 mEq/L (ref 96–112)
Creatinine, Ser: 0.91 mg/dL (ref 0.40–1.50)
GFR: 114.56 mL/min (ref >60.00)
Glucose, Bld: 146 mg/dL — ABNORMAL HIGH (ref 70–99)
Potassium: 4.9 mEq/L (ref 3.5–5.1)
Sodium: 139 mEq/L (ref 135–145)

## 2014-12-15 ENCOUNTER — Ambulatory Visit (INDEPENDENT_AMBULATORY_CARE_PROVIDER_SITE_OTHER): Payer: BC Managed Care – PPO | Admitting: Family Medicine

## 2014-12-15 ENCOUNTER — Encounter: Payer: Self-pay | Admitting: Family Medicine

## 2014-12-15 VITALS — BP 156/86 | HR 78 | Temp 98.9°F | Wt 322.8 lb

## 2014-12-15 DIAGNOSIS — Z23 Encounter for immunization: Secondary | ICD-10-CM | POA: Diagnosis not present

## 2014-12-15 DIAGNOSIS — E118 Type 2 diabetes mellitus with unspecified complications: Secondary | ICD-10-CM

## 2014-12-15 DIAGNOSIS — Z119 Encounter for screening for infectious and parasitic diseases, unspecified: Secondary | ICD-10-CM | POA: Diagnosis not present

## 2014-12-15 DIAGNOSIS — E1165 Type 2 diabetes mellitus with hyperglycemia: Secondary | ICD-10-CM

## 2014-12-15 DIAGNOSIS — E119 Type 2 diabetes mellitus without complications: Secondary | ICD-10-CM | POA: Diagnosis not present

## 2014-12-15 DIAGNOSIS — IMO0002 Reserved for concepts with insufficient information to code with codable children: Secondary | ICD-10-CM

## 2014-12-15 NOTE — Patient Instructions (Addendum)
I would get a flu shot each fall.   Recheck labs in about 3 months before a visit.  Fasting for the labs, not for the visit.  Okay to take your meds before both (unless you are worried about a low sugar the day of lab- can skip metformin that AM if needed). Work on your diet to get your weight down.  Take care.  Glad to see you.   Thanks for your effort.

## 2014-12-15 NOTE — Progress Notes (Signed)
Pre visit review using our clinic review tool, if applicable. No additional management support is needed unless otherwise documented below in the visit note.  Diabetes:  Using medications without difficulties:yes Hypoglycemic episodes:no Hyperglycemic episodes:no Feet problems: no Blood Sugars averaging: 130-140s eye exam within last year: yes Diet has been irregular with work schedule.  D/w pt.   He has been able to tolerate >1000mg  in a day in the past.  Off insulin.  A1c d/w pt.   Compliant with meds o/w but didn't take meds this AM before the OV- he didn't know if he should take them before OV- d/w pt to do so.    His breathing is good.  He has no cough.  He is clearly improved from his prev baseline.  "I feel good."    Pt opts in for HIV screening with next set of labs.  D/w pt re: routine screening.    Meds, vitals, and allergies reviewed.   ROS: See HPI.  Otherwise negative.    GEN: nad, alert and oriented HEENT: mucous membranes moist NECK: supple w/o LA CV: rrr. PULM: ctab, no inc wob ABD: soft, +bs EXT: no edema SKIN: no acute rash  Diabetic foot exam: Normal inspection No skin breakdown No calluses  Normal DP pulses Normal sensation to light touch and monofilament Nails normal

## 2014-12-15 NOTE — Assessment & Plan Note (Signed)
Not fully controlled, but still better than prev.  D/w pt.  No change in meds at this point.  With dec in weight, should be able to get to goal.  D/w pt about flu shot, encouraged, he'll consider.  Labs and diet d/w pt.  >25 minutes spent in face to face time with patient, >50% spent in counselling or coordination of care.  I thanked him for his efforts with weight reduction.   Recheck in about 3 months.

## 2014-12-30 ENCOUNTER — Other Ambulatory Visit: Payer: Self-pay | Admitting: Nurse Practitioner

## 2015-02-08 LAB — HM DIABETES EYE EXAM

## 2015-03-03 ENCOUNTER — Ambulatory Visit (INDEPENDENT_AMBULATORY_CARE_PROVIDER_SITE_OTHER): Payer: BC Managed Care – PPO | Admitting: Cardiology

## 2015-03-03 ENCOUNTER — Encounter: Payer: Self-pay | Admitting: Cardiology

## 2015-03-03 VITALS — BP 154/108 | HR 89 | Ht 69.5 in | Wt 344.8 lb

## 2015-03-03 DIAGNOSIS — I429 Cardiomyopathy, unspecified: Secondary | ICD-10-CM

## 2015-03-03 DIAGNOSIS — I10 Essential (primary) hypertension: Secondary | ICD-10-CM

## 2015-03-03 NOTE — Progress Notes (Signed)
Cardiology Office Note   Date:  03/03/2015   ID:  Taelon, Bendorf 1965-08-01, MRN [id number]  PCP:  Crawford Givens, MD  Cardiologist: Cassell Clement MD  Chief Complaint  Patient presents with  . Hypertension      History of Present Illness: Austin Shaw is a 49 y.o. male who presents for  Four-month follow-up visit.  This gentleman has a history of chronic systolic heart failure. His echocardiogram on 08/10/14 showed severe left ventricular dysfunction with an ejection fraction of 20-25%. He was subsequently placed on beta blocker and ARB. He returns now for follow-up. A follow-up echocardiogram on 11/12/14 shows significant improvement with ejection fraction now up to 50-55% with grade 1 diastolic dysfunction.He has long standing IDDM, HTN, HLD and morbid obesity.  He reports that when he tried to increase his carvedilol he felt bad and dizzy and was not able to tolerate a higher dose.  the patient states that he has not taken any of his heart medicines for the past 4-6 weeks. He thought that he could keep his weight off by exercise alone. His weight is up 20 pounds since last visit.  He denies chest pain shortness of breath or peripheral edema.  Past Medical History  Diagnosis Date  . Diabetes mellitus     type 2  . Hyperlipidemia   . Hypertension   . Morbid obesity (HCC)     BMI 50    Past Surgical History  Procedure Laterality Date  . None       Current Outpatient Prescriptions  Medication Sig Dispense Refill  . carvedilol (COREG) 3.125 MG tablet Take 1 tablet (3.125 mg total) by mouth 2 (two) times daily.    . furosemide (LASIX) 40 MG tablet Take 0.5 tablets (20 mg total) by mouth daily as needed for fluid or edema. 30 tablet 1  . losartan (COZAAR) 25 MG tablet TAKE 1 TABLET BY MOUTH EVERY DAY 60 tablet 6  . metFORMIN (GLUCOPHAGE-XR) 500 MG 24 hr tablet Take 1 tablet (500 mg total) by mouth 2 (two) times daily.    [name] VIAGRA 100 MG tablet Take 100 mg  by mouth as needed (ED).      No current facility-administered medications for this visit.    Allergies:   Review of patient's allergies indicates no active allergies.    Social History:  The patient  reports that he has never smoked. He has never used smokeless tobacco. He reports that he does not drink alcohol or use illicit drugs.   Family History:  The patient's family history includes Congestive Heart Failure in an other family member; Diabetes in his father and another family member.    ROS:  Please see the history of present illness.   Otherwise, review of systems are positive for none.   All other systems are reviewed and negative.    PHYSICAL EXAM: VS:  BP 154/108 mmHg  Pulse 89  Ht 5' 9.5" (1.765 m)  Wt 344 lb 12.8 oz (156.4 kg)  BMI 50.21 kg/m2  SpO2 98% , BMI Body mass index is 50.21 kg/(m^2). GEN: Well nourished, well developed, in no acute distress HEENT: normal Neck: no JVD, carotid bruits, or masses Cardiac: RRR; no murmurs, rubs, or gallops,no edema  Respiratory:  clear to auscultation bilaterally, normal work of breathing GI: soft, nontender, nondistended, + BS MS: no deformity or atrophy Skin: warm and dry, no rash Neuro:  Strength and sensation are intact Psych: euthymic mood, full affect   EKG:  EKG is ordered today. The ekg ordered today demonstrates  Normal sinus rhythm.  Within normal limits.   Recent Labs: 08/12/2014: ALT 30; Hemoglobin 13.0; Platelets 211.0; TSH 1.34 08/26/2014: Pro B Natriuretic peptide (BNP) 112.0* 12/10/2014: BUN 19; Creatinine, Ser 0.91; Potassium 4.9; Sodium 139    Lipid Panel    Component Value Date/Time   CHOL 207* 09/03/2014 0804   TRIG 101.0 09/03/2014 0804   HDL 47.20 09/03/2014 0804   CHOLHDL 4 09/03/2014 0804   VLDL 20.2 09/03/2014 0804   LDLCALC 140* 09/03/2014 0804   LDLDIRECT 155.4 01/19/2011 0908      Wt Readings from Last 3 Encounters:  03/03/15 344 lb 12.8 oz (156.4 kg)  12/15/14 322 lb 12 oz (146.398  kg)  12/04/14 324 lb (146.965 kg)        ASSESSMENT AND PLAN:  1. Chronic systolic heart failure, improved 2. Diabetes mellitus 3. Essential hypertension 4. Morbid obesity 5. Hyperlipidemia 6. Weight gain secondary to dietary and medication noncompliance. No evidence of fluid overload.   Current medicines are reviewed at length with the patient today.  The patient does not have concerns regarding medicines.  The following changes have been made:  no change  Labs/ tests ordered today include:   Orders Placed This Encounter  Procedures  . EKG 12-Lead    disposition: I reviewed with him how the carvedilol and the losartan had resulted in significant improvement in his left ventricular systolic function. I told him that he needs to stay on those medicines long-term. Without the medicine, his blood pressure is now elevated and over time his left ventricular systolic function will deteriorate again. He needs to resume taking his carvedilol losartan and furosemide. Recheck in 4 months for follow-up office visit with Lawson Fiscal and get a basal metabolic panel at that time. Be more careful with diet and continue regular exercise   Signed, Cassell Clement MD 03/03/2015 8:53 AM    [facility] [address] [place], [place], [place]  [address] Phone: [phone]; Fax: [phone]

## 2015-03-03 NOTE — Patient Instructions (Signed)
Medication Instructions:  RESUME YOUR MEDICATIONS  Labwork: NONE  Testing/Procedures: NONE  Follow-Up: Your physician recommends that you schedule a follow-up appointment in: 4 MONTH OV/BMET WITH LORI G NP  Any Other Special Instructions Will Be Listed Below (If Applicable). Work harder on your diet   If you need a refill on your cardiac medications before your next appointment, please call your pharmacy.

## 2015-03-11 ENCOUNTER — Encounter: Payer: Self-pay | Admitting: Family Medicine

## 2015-03-12 ENCOUNTER — Other Ambulatory Visit (INDEPENDENT_AMBULATORY_CARE_PROVIDER_SITE_OTHER): Payer: BC Managed Care – PPO

## 2015-03-12 DIAGNOSIS — E119 Type 2 diabetes mellitus without complications: Secondary | ICD-10-CM | POA: Diagnosis not present

## 2015-03-12 DIAGNOSIS — Z119 Encounter for screening for infectious and parasitic diseases, unspecified: Secondary | ICD-10-CM

## 2015-03-12 LAB — HEMOGLOBIN A1C: Hgb A1c MFr Bld: 10.9 % — ABNORMAL HIGH (ref 4.6–6.5)

## 2015-03-12 LAB — LIPID PANEL
Cholesterol: 215 mg/dL — ABNORMAL HIGH (ref 0–200)
HDL: 49.8 mg/dL (ref >39.00)
LDL Cholesterol: 144 mg/dL — ABNORMAL HIGH (ref 0–99)
NonHDL: 165.2
Total CHOL/HDL Ratio: 4
Triglycerides: 108 mg/dL (ref 0.0–149.0)
VLDL: 21.6 mg/dL (ref 0.0–40.0)

## 2015-03-15 LAB — HIV ANTIBODY (ROUTINE TESTING W REFLEX): HIV 1&2 Ab, 4th Generation: NONREACTIVE

## 2015-03-19 ENCOUNTER — Ambulatory Visit: Payer: BC Managed Care – PPO | Admitting: Family Medicine

## 2015-03-19 DIAGNOSIS — Z0289 Encounter for other administrative examinations: Secondary | ICD-10-CM

## 2015-03-22 ENCOUNTER — Encounter: Payer: Self-pay | Admitting: Family Medicine

## 2015-03-22 ENCOUNTER — Ambulatory Visit (INDEPENDENT_AMBULATORY_CARE_PROVIDER_SITE_OTHER): Payer: BC Managed Care – PPO | Admitting: Family Medicine

## 2015-03-22 VITALS — BP 150/100 | HR 88 | Temp 98.6°F | Wt 346.0 lb

## 2015-03-22 DIAGNOSIS — E1165 Type 2 diabetes mellitus with hyperglycemia: Secondary | ICD-10-CM

## 2015-03-22 DIAGNOSIS — N529 Male erectile dysfunction, unspecified: Secondary | ICD-10-CM

## 2015-03-22 DIAGNOSIS — E118 Type 2 diabetes mellitus with unspecified complications: Secondary | ICD-10-CM

## 2015-03-22 DIAGNOSIS — IMO0002 Reserved for concepts with insufficient information to code with codable children: Secondary | ICD-10-CM

## 2015-03-22 DIAGNOSIS — Z794 Long term (current) use of insulin: Secondary | ICD-10-CM

## 2015-03-22 MED ORDER — TADALAFIL 20 MG PO TABS
10.0000 mg | ORAL_TABLET | ORAL | Status: DC | PRN
Start: 2015-03-22 — End: 2016-02-11

## 2015-03-22 NOTE — Patient Instructions (Signed)
Get a flu shot when you can- call for a nurse visit.   Please pick one thing each day to work on- either diet or exercise- and see how much you can get done.  Recheck labs in about 3 months.   If not getting better, then let me know.  Try cialis and update me as needed.  Take care.  Glad to see you.

## 2015-03-22 NOTE — Progress Notes (Signed)
Pre visit review using our clinic review tool, if applicable. No additional management support is needed unless otherwise documented below in the visit note.  Had been off meds, not going to the gym, not eating right on DM2 diet.  He got depressed about ED and also viagra not helping.  He was compensating for the mood changes with comfort eating.  "I got to get back in the game."  No SI/HI.   D/w pt.    Diabetes:  Using medications without difficulties: not consistently.   Hypoglycemic episodes: no sx Hyperglycemic episodes: no sx other than some occ mild nocturia.   Feet problems: no Blood Sugars averaging: not checked.  eye exam within last year: yes A1c up.  D/w pt.  He had been off meds, see above.    Meds, vitals, and allergies reviewed.   ROS: See HPI.  Otherwise, noncontributory.  GEN: nad, alert and oriented HEENT: mucous membranes moist NECK: supple w/o LA CV: rrr.  PULM: ctab, no inc wob ABD: soft, +bs EXT: no edema

## 2015-03-24 NOTE — Assessment & Plan Note (Addendum)
DM2 control more important than lipid control at this point.  Restart his baseline meds, he'll get back to his DM2 diet and he'll try to work on weight.  He's going to work on "one thing at a time."  D/w pt.  He agrees.  He'll update me as he is making changes.  Return for flu shot per patient preference.

## 2015-03-24 NOTE — Assessment & Plan Note (Signed)
He can try cialis.  Needs Dm2 control at that affects ED, d/w pt.

## 2015-06-20 ENCOUNTER — Other Ambulatory Visit: Payer: Self-pay | Admitting: Family Medicine

## 2015-06-20 DIAGNOSIS — E118 Type 2 diabetes mellitus with unspecified complications: Principal | ICD-10-CM

## 2015-06-20 DIAGNOSIS — E1165 Type 2 diabetes mellitus with hyperglycemia: Secondary | ICD-10-CM

## 2015-06-20 DIAGNOSIS — IMO0002 Reserved for concepts with insufficient information to code with codable children: Secondary | ICD-10-CM

## 2015-06-23 ENCOUNTER — Other Ambulatory Visit: Payer: BC Managed Care – PPO

## 2015-06-28 ENCOUNTER — Ambulatory Visit: Payer: BC Managed Care – PPO | Admitting: Family Medicine

## 2015-07-09 ENCOUNTER — Other Ambulatory Visit: Payer: BC Managed Care – PPO

## 2015-07-13 ENCOUNTER — Ambulatory Visit: Payer: BC Managed Care – PPO | Admitting: Family Medicine

## 2015-07-20 ENCOUNTER — Other Ambulatory Visit: Payer: BC Managed Care – PPO

## 2015-07-20 ENCOUNTER — Ambulatory Visit: Payer: BC Managed Care – PPO | Admitting: Nurse Practitioner

## 2015-09-01 ENCOUNTER — Other Ambulatory Visit: Payer: BC Managed Care – PPO

## 2015-09-01 ENCOUNTER — Ambulatory Visit: Payer: BC Managed Care – PPO | Admitting: Nurse Practitioner

## 2015-09-02 ENCOUNTER — Encounter: Payer: Self-pay | Admitting: Nurse Practitioner

## 2015-10-21 ENCOUNTER — Telehealth: Payer: Self-pay | Admitting: Nurse Practitioner

## 2015-10-21 MED ORDER — LOSARTAN POTASSIUM 25 MG PO TABS: 25.0000 mg | ORAL_TABLET | Freq: Every day | ORAL | Status: DC

## 2015-10-21 MED ORDER — CARVEDILOL 3.125 MG PO TABS: 3.1250 mg | ORAL_TABLET | Freq: Two times a day (BID) | ORAL | Status: DC

## 2015-10-21 MED ORDER — FUROSEMIDE 40 MG PO TABS: 20.0000 mg | ORAL_TABLET | Freq: Every day | ORAL | Status: DC

## 2015-10-21 NOTE — Telephone Encounter (Signed)
New Message  Pt wife requested to speak w/ RN- would not specify nature of phone call. Please call back and discuss.

## 2015-10-21 NOTE — Telephone Encounter (Signed)
Pt's wife called regarding pt being SOB with activity, cough, and LE edema since June this year. Wife states pt is non compliance with his medication. Pt has a Hx of CHF. Wife states she had to changed her scheduled at work to make sure pt takes his medication. Refill for Lasix 20 mg 1/2 tablet daily as needed for LE edema, Losartan 25 mg daily and Carvedilol 3.125 mg 2 times daily sent to [facility] pt and wife aware. Pt wants a sooner appointment. Appointment with Verdis Prime NP changed from 11/09/15 to 10/29/15/at 8:00 AM pt and wife are aware. Pt and wife are aware to call the office if needed.

## 2015-10-29 ENCOUNTER — Ambulatory Visit (INDEPENDENT_AMBULATORY_CARE_PROVIDER_SITE_OTHER): Payer: BC Managed Care – PPO | Admitting: Nurse Practitioner

## 2015-10-29 ENCOUNTER — Encounter: Payer: Self-pay | Admitting: Nurse Practitioner

## 2015-10-29 ENCOUNTER — Telehealth: Payer: Self-pay | Admitting: Family Medicine

## 2015-10-29 VITALS — BP 128/90 | HR 135 | Ht 69.5 in | Wt 349.0 lb

## 2015-10-29 DIAGNOSIS — E785 Hyperlipidemia, unspecified: Secondary | ICD-10-CM

## 2015-10-29 DIAGNOSIS — I10 Essential (primary) hypertension: Secondary | ICD-10-CM | POA: Diagnosis not present

## 2015-10-29 DIAGNOSIS — I5022 Chronic systolic (congestive) heart failure: Secondary | ICD-10-CM

## 2015-10-29 DIAGNOSIS — I429 Cardiomyopathy, unspecified: Secondary | ICD-10-CM | POA: Diagnosis not present

## 2015-10-29 LAB — BASIC METABOLIC PANEL
BUN: 19 mg/dL (ref 7–25)
CO2: 26 mmol/L (ref 20–31)
Calcium: 8.7 mg/dL (ref 8.6–10.3)
Chloride: 99 mmol/L (ref 98–110)
Creat: 1.1 mg/dL (ref 0.60–1.35)
Glucose, Bld: 186 mg/dL — ABNORMAL HIGH (ref 65–99)
Potassium: 4.2 mmol/L (ref 3.5–5.3)
Sodium: 137 mmol/L (ref 135–146)

## 2015-10-29 LAB — LIPID PANEL
Cholesterol: 184 mg/dL (ref 125–200)
HDL: 46 mg/dL (ref >=40)
LDL Cholesterol: 118 mg/dL (ref <130)
Total CHOL/HDL Ratio: 4 Ratio (ref <=5.0)
Triglycerides: 98 mg/dL (ref <150)
VLDL: 20 mg/dL (ref <30)

## 2015-10-29 LAB — HEPATIC FUNCTION PANEL
ALT: 20 U/L (ref 9–46)
AST: 22 U/L (ref 10–40)
Albumin: 3.7 g/dL (ref 3.6–5.1)
Alkaline Phosphatase: 61 U/L (ref 40–115)
Bilirubin, Direct: 0.2 mg/dL (ref <=0.2)
Indirect Bilirubin: 0.5 mg/dL (ref 0.2–1.2)
Total Bilirubin: 0.7 mg/dL (ref 0.2–1.2)
Total Protein: 7.1 g/dL (ref 6.1–8.1)

## 2015-10-29 LAB — CBC
HCT: 40.6 % (ref 38.5–50.0)
Hemoglobin: 13 g/dL — ABNORMAL LOW (ref 13.2–17.1)
MCH: 28.6 pg (ref 27.0–33.0)
MCHC: 32 g/dL (ref 32.0–36.0)
MCV: 89.2 fL (ref 80.0–100.0)
MPV: 11.2 fL (ref 7.5–12.5)
Platelets: 288 10*3/uL (ref 140–400)
RBC: 4.55 MIL/uL (ref 4.20–5.80)
RDW: 14 % (ref 11.0–15.0)
WBC: 8 10*3/uL (ref 3.8–10.8)

## 2015-10-29 LAB — BRAIN NATRIURETIC PEPTIDE: Brain Natriuretic Peptide: 68 pg/mL (ref <100)

## 2015-10-29 LAB — HEMOGLOBIN A1C
Hgb A1c MFr Bld: 12 % — ABNORMAL HIGH (ref <5.7)
Mean Plasma Glucose: 298 mg/dL

## 2015-10-29 NOTE — Telephone Encounter (Signed)
Call pt.  Needs OV re: DM2 f/u.  Thanks.

## 2015-10-29 NOTE — Progress Notes (Signed)
CARDIOLOGY OFFICE NOTE  Date:  10/29/2015    Austin Shaw Date of Birth: 04-06-1966 Medical Record #[id number]  PCP:  Crawford Givens, MD  Cardiologist:  Tyrone Sage    Chief Complaint  Patient presents with  . Congestive Heart Failure  . Hyperlipidemia  . Hypertension    8 month check - former patient of Dr. Yevonne Pax    History of Present Illness: Austin Shaw is a 50 y.o. male who presents today for a follow up visit. Former patient of Dr. Yevonne Pax - to follow with   He has a history of chronic systolic heart failure. His echocardiogram on 08/10/14 showed severe left ventricular dysfunction with an ejection fraction of 20-25%. He was subsequently placed on beta blocker and ARB. he has not been able to tolerate high dose beta blocker due to dizziness.  A follow-up echocardiogram on 11/12/14 shows significant improvement with ejection fraction now up to 50-55% with grade 1 diastolic dysfunction.  Other issues include long standing IDDM, HTN, HLD and morbid obesity. Initially he was very successful with weight loss.   Last seen back in November - was doing ok - had gained weight back however.   Comes back today. Here with his wife. Remains off track with taking care of himself. Weight climbing. Stopped his medicines back after last visit but about 8 days ago just started his Coreg back - only taking once a day. He is short of breath with activity. Notes leg swelling and cough as well. HR is up - he says it always is elevated but last EKG showed HR of 89. Having trouble sleeping. Basically feels like he did when he was first diagnosed. Says he stopped his medicines because "I thought I could do it on my own".    Past Medical History  Diagnosis Date  . Diabetes mellitus     type 2  . Hyperlipidemia   . Hypertension   . Morbid obesity (HCC)     BMI 50    Past Surgical History  Procedure Laterality Date  . None       Medications: Current Outpatient  Prescriptions  Medication Sig Dispense Refill  . carvedilol (COREG) 3.125 MG tablet Take 1 tablet (3.125 mg total) by mouth 2 (two) times daily. 60 tablet 6  . furosemide (LASIX) 40 MG tablet Take 0.5 tablets (20 mg total) by mouth daily. 30 tablet 3  . losartan (COZAAR) 25 MG tablet Take 1 tablet (25 mg total) by mouth daily. 30 tablet 6  . metFORMIN (GLUCOPHAGE-XR) 500 MG 24 hr tablet Take 1 tablet (500 mg total) by mouth 2 (two) times daily.    . tadalafil (CIALIS) 20 MG tablet Take 0.5-1 tablets (10-20 mg total) by mouth every other day as needed for erectile dysfunction. 5 tablet 11   No current facility-administered medications for this visit.    Allergies: No Known Allergies  Social History: The patient  reports that he has never smoked. He has never used smokeless tobacco. He reports that he does not drink alcohol or use illicit drugs.   Family History: The patient's family history includes Diabetes in his father.   Review of Systems: Please see the history of present illness.   Otherwise, the review of systems is positive for none.   All other systems are reviewed and negative.   Physical Exam: VS:  BP 128/90 mmHg  Pulse 135  Ht 5' 9.5" (1.765 m)  Wt 349 lb (158.305 kg)  BMI 50.82 kg/m2 .  BMI Body mass index is 50.82 kg/(m^2).  Wt Readings from Last 3 Encounters:  10/29/15 349 lb (158.305 kg)  03/22/15 346 lb (156.945 kg)  03/03/15 344 lb 12.8 oz (156.4 kg)    General: Morbidly obese. He is alert and in no acute distress. Seems fatigued. HEENT: Normal. Neck: Supple, no JVD, carotid bruits, or masses noted.  Cardiac: Regular rhythm but fast. Has S3 gallop. Legs are full but no real edema this AM.  Respiratory:  Lungs are clear to auscultation bilaterally with normal work of breathing.  GI: Soft and nontender.  MS: No deformity or atrophy. Gait and ROM intact. Skin: Warm and dry. Color is normal.  Neuro:  Strength and sensation are intact and no gross focal  deficits noted.  Psych: Alert, appropriate and with normal affect.   LABORATORY DATA:  EKG:  EKG is ordered today. This shows sinus tach.   Lab Results  Component Value Date   WBC 7.2 08/12/2014   HGB 13.0 08/12/2014   HCT 40.0 08/12/2014   PLT 211.0 08/12/2014   GLUCOSE 146* 12/10/2014   CHOL 215* 03/12/2015   TRIG 108.0 03/12/2015   HDL 49.80 03/12/2015   LDLDIRECT 155.4 01/19/2011   LDLCALC 144* 03/12/2015   ALT 30 08/12/2014   AST 25 08/12/2014   NA 139 12/10/2014   K 4.9 12/10/2014   CL 102 12/10/2014   CREATININE 0.91 12/10/2014   BUN 19 12/10/2014   CO2 30 12/10/2014   TSH 1.34 08/12/2014   PSA 0.82 06/19/2012   HGBA1C 10.9* 03/12/2015   MICROALBUR 15.8* 09/03/2014    BNP (last 3 results) No results for input(s): BNP in the last 8760 hours.  ProBNP (last 3 results) No results for input(s): PROBNP in the last 8760 hours.   Other Studies Reviewed Today:  Echo Study Conclusions from 11/2014  - Left ventricle: Inferobasal hypokinesis. The cavity size was  mildly dilated. Wall thickness was increased in a pattern of  moderate LVH. Systolic function was normal. The estimated  ejection fraction was in the range of 50% to 55%. Wall motion was  normal; there were no regional wall motion abnormalities. Doppler  parameters are consistent with abnormal left ventricular  relaxation (grade 1 diastolic dysfunction). - Atrial septum: No defect or patent foramen ovale was identified.   Assessment/Plan: 1. Chronic systolic heart failure - last echo from 11/2014 showed EF has improved to 50 to 55% with grade 1 DD. Needs to work on risk factors. Has stopped his medicines - just restarted 8 days ago but not on BID dosing of Coreg. Increasing that today. Discussed at length about prognosis going forward - may not be able to see improvement in EF with resumption of medicines as we did before. Will consider rechecking echo when we get his HR better controlled.   2.  Diabetes mellitus - managed by PCP - just restarted his oral agent.   3. Essential hypertension - fair on current regimen. Coreg increased to BID today.   4. Morbid obesity - this is the crux of his issues. Discussed at length today.   5. Hyperlipidemia - needs labs today.  6. Noncompliance - this will be problematic going forward and I have discussed this with him in depth today.   Current medicines are reviewed with the patient today.  The patient does not have concerns regarding medicines other than what has been noted above.  The following changes have been made:  See above.  Labs/ tests ordered today include:  Orders Placed This Encounter  Procedures  . Basic metabolic panel  . Lipid panel  . Brain natriuretic peptide  . CBC  . Hepatic function panel  . Hemoglobin A1c  . EKG 12-Lead     Disposition:   FU with me in 2 to 3 weeks.   Patient is agreeable to this plan and will call if any problems develop in the interim.   Signed: Rosalio Macadamia, RN, ANP-C 10/29/2015 8:31 AM  [facility] [address] [place], [place]  [address] Phone: [phone] Fax: [phone]

## 2015-10-29 NOTE — Patient Instructions (Addendum)
We will be checking the following labs today - BMET, CBC, BNP, HPF, lipids and A1C   Medication Instructions:    Continue with your current medicines. BUT  Increase the Coreg to twice a day    Testing/Procedures To Be Arranged:  N/A  Follow-Up:   See me in 2 to 3 weeks.     Other Special Instructions:   Think about what we talked about today.     If you need a refill on your cardiac medications before your next appointment, please call your pharmacy.   Call the [facility] office at [phone] if you have any questions, problems or concerns.

## 2015-11-01 NOTE — Telephone Encounter (Signed)
Left detailed message on voicemail.  

## 2015-11-09 ENCOUNTER — Ambulatory Visit: Payer: BC Managed Care – PPO | Admitting: Nurse Practitioner

## 2015-11-12 ENCOUNTER — Ambulatory Visit (INDEPENDENT_AMBULATORY_CARE_PROVIDER_SITE_OTHER): Payer: Self-pay | Admitting: Nurse Practitioner

## 2015-11-12 DIAGNOSIS — I5022 Chronic systolic (congestive) heart failure: Secondary | ICD-10-CM

## 2015-11-12 DIAGNOSIS — I10 Essential (primary) hypertension: Secondary | ICD-10-CM

## 2015-11-12 DIAGNOSIS — E785 Hyperlipidemia, unspecified: Secondary | ICD-10-CM

## 2015-11-12 DIAGNOSIS — I429 Cardiomyopathy, unspecified: Secondary | ICD-10-CM

## 2015-11-12 NOTE — Progress Notes (Signed)
CARDIOLOGY OFFICE NOTE  Date:  11/12/2015    Austin Shaw Date of Birth: 12-11-1965 Medical Record #[id number]  PCP:  Crawford Givens, MD  Cardiologist:  Former patient of Dr. Yevonne Pax  Chief Complaint  Patient presents with  . Congestive Heart Failure  . Cardiomyopathy  . Shortness of Breath  . Hypertension    Follow up visit - former patient of Dr. Patty Sermons    History of Present Illness: Austin Shaw is a 50 y.o. male who presents today for a follow up visit.  Former patient of Dr. Yevonne Pax   He has a history of chronic systolic heart failure. His echocardiogram on 08/10/14 showed severe left ventricular dysfunction with an ejection fraction of 20-25%. He was subsequently placed on beta blocker and ARB. he has not been able to tolerate high dose beta blocker due to dizziness.  A follow-up echocardiogram on 11/12/14 shows significant improvement with ejection fraction now up to 50-55% with grade 1 diastolic dysfunction.  Other issues include long standing IDDM, HTN, HLD and morbid obesity. Initially he was very successful with weight loss.   Last seen back in November - was doing ok - had gained weight back however.  I saw him last month - had stopped all of his medicines until about a week prior to that visit - thought "he could do it on his own". BP sky high. Weight back up. He was short of breath. Tachycardic.      Past Medical History:  Diagnosis Date  . Diabetes mellitus    type 2  . Hyperlipidemia   . Hypertension   . Morbid obesity (HCC)    BMI 50    Past Surgical History:  Procedure Laterality Date  . none       Medications: Current Outpatient Prescriptions  Medication Sig Dispense Refill  . carvedilol (COREG) 3.125 MG tablet Take 1 tablet (3.125 mg total) by mouth 2 (two) times daily. 60 tablet 6  . furosemide (LASIX) 40 MG tablet Take 0.5 tablets (20 mg total) by mouth daily. 30 tablet 3  . losartan (COZAAR) 25 MG tablet Take 1  tablet (25 mg total) by mouth daily. 30 tablet 6  . metFORMIN (GLUCOPHAGE-XR) 500 MG 24 hr tablet Take 1 tablet (500 mg total) by mouth 2 (two) times daily.    . tadalafil (CIALIS) 20 MG tablet Take 0.5-1 tablets (10-20 mg total) by mouth every other day as needed for erectile dysfunction. 5 tablet 11   No current facility-administered medications for this visit.     Allergies: No Known Allergies  Social History: The patient  reports that he has never smoked. He has never used smokeless tobacco. He reports that he does not drink alcohol or use drugs.   Family History: The patient's family history includes Diabetes in his father.   Review of Systems: Please see the history of present illness.   Otherwise, the review of systems is positive for none.   All other systems are reviewed and negative.   Physical Exam: VS:  There were no vitals taken for this visit. [name]  BMI There is no height or weight on file to calculate BMI.  Wt Readings from Last 3 Encounters:  10/29/15 (!) 349 lb (158.3 kg)  03/22/15 (!) 346 lb (156.9 kg)  03/03/15 (!) 344 lb 12.8 oz (156.4 kg)    PATIENT LEFT OFFICE WITHOUT BEING SEEN AND GAVE NO EXPLANATION   LABORATORY DATA:  EKG:  EKG is not ordered today.  Lab  Results  Component Value Date   WBC 8.0 10/29/2015   HGB 13.0 (L) 10/29/2015   HCT 40.6 10/29/2015   PLT 288 10/29/2015   GLUCOSE 186 (H) 10/29/2015   CHOL 184 10/29/2015   TRIG 98 10/29/2015   HDL 46 10/29/2015   LDLDIRECT 155.4 01/19/2011   LDLCALC 118 10/29/2015   ALT 20 10/29/2015   AST 22 10/29/2015   NA 137 10/29/2015   K 4.2 10/29/2015   CL 99 10/29/2015   CREATININE 1.10 10/29/2015   BUN 19 10/29/2015   CO2 26 10/29/2015   TSH 1.34 08/12/2014   PSA 0.82 06/19/2012   HGBA1C 12.0 (H) 10/29/2015   MICROALBUR 15.8 (H) 09/03/2014    BNP (last 3 results)  Recent Labs  10/29/15 0834  BNP 68.0    ProBNP (last 3 results) No results for input(s): PROBNP in the last 8760  hours.   Other Studies Reviewed Today:  Echo Study Conclusions from 11/2014  - Left ventricle: Inferobasal hypokinesis. The cavity size was  mildly dilated. Wall thickness was increased in a pattern of  moderate LVH. Systolic function was normal. The estimated  ejection fraction was in the range of 50% to 55%. Wall motion was  normal; there were no regional wall motion abnormalities. Doppler  parameters are consistent with abnormal left ventricular  relaxation (grade 1 diastolic dysfunction). - Atrial septum: No defect or patent foramen ovale was identified.   Assessment/Plan: 1. Chronic systolic heart failure - last echo from 11/2014 showed EF has improved to 50 to 55% with grade 1 DD.  Had stopped his medicines for several months and just restarted about a week prior to his last visit with me. Coreg increased to BID at last visit. Needed echo when HR more controlled.   2. Diabetes mellitus - managed by PCP - A1C shows poor control.   3. Essential hypertension -   4. Morbid obesity -    5. Hyperlipidemia - recent labs noted  6. Noncompliance - this will be problematic going forward and I have discussed this with him in depth at his last visit   Current medicines are reviewed with the patient today.  The patient does not have concerns regarding medicines other than what has been noted above.  The following changes have been made:  See above.  Labs/ tests ordered today include:   No orders of the defined types were placed in this encounter.    Disposition:   FU with me in 3 week.   Patient is agreeable to this plan and will call if any problems develop in the interim.   Signed: Rosalio Macadamia, RN, ANP-C 11/12/2015 8:17 AM  [facility] [address] [place], [place]  [address] Phone: [phone] Fax: [phone]

## 2015-11-12 NOTE — Patient Instructions (Signed)
We will be checking the following labs today -    Medication Instructions:    Continue with your current medicines.     Testing/Procedures To Be Arranged:  N/A  Follow-Up:   See     Other Special Instructions:   N/A    If you need a refill on your cardiac medications before your next appointment, please call your pharmacy.   Call the  [facility] office at [phone] if you have any questions, problems or concerns.      

## 2015-12-06 ENCOUNTER — Other Ambulatory Visit: Payer: Self-pay | Admitting: Family Medicine

## 2015-12-06 NOTE — Telephone Encounter (Addendum)
Received refill electronically Last office visit 03/22/15 Last refill message was left for patient to call and schedule a follow-up on diabetes. No appointment scheduled Refill request does not match medication list

## 2015-12-07 NOTE — Telephone Encounter (Signed)
Left detailed message on voicemail.  

## 2015-12-07 NOTE — Telephone Encounter (Signed)
Verify current dose with patient and schedule first.  Thanks.

## 2015-12-08 ENCOUNTER — Other Ambulatory Visit: Payer: Self-pay | Admitting: Nurse Practitioner

## 2015-12-10 ENCOUNTER — Other Ambulatory Visit: Payer: Self-pay | Admitting: *Deleted

## 2015-12-10 MED ORDER — METFORMIN HCL ER 500 MG PO TB24: 500.0000 mg | ORAL_TABLET | Freq: Two times a day (BID) | ORAL | 0 refills | Status: DC

## 2015-12-10 NOTE — Telephone Encounter (Signed)
Patient scheduled appt on 12/20/15.  Rx sent to pharmacy.  Wife states he is taking 2 tablets per day.

## 2015-12-20 ENCOUNTER — Telehealth: Payer: Self-pay | Admitting: Family Medicine

## 2015-12-20 ENCOUNTER — Ambulatory Visit: Payer: Self-pay | Admitting: Family Medicine

## 2015-12-20 DIAGNOSIS — Z0289 Encounter for other administrative examinations: Secondary | ICD-10-CM

## 2015-12-20 NOTE — Telephone Encounter (Signed)
Patient did not come in for their appointment today for diabetes check up Please let me know if patient needs to be contacted immediately for follow up or no follow up needed.

## 2015-12-21 NOTE — Telephone Encounter (Signed)
Needs f/u.  Thanks.  

## 2015-12-21 NOTE — Telephone Encounter (Signed)
L/m to r/s appt  °

## 2015-12-29 ENCOUNTER — Encounter: Payer: Self-pay | Admitting: Family Medicine

## 2015-12-29 NOTE — Telephone Encounter (Signed)
Sent letter to home address

## 2016-01-14 ENCOUNTER — Other Ambulatory Visit: Payer: Self-pay | Admitting: Family Medicine

## 2016-02-07 ENCOUNTER — Other Ambulatory Visit: Payer: Self-pay | Admitting: Family Medicine

## 2016-02-07 DIAGNOSIS — E1165 Type 2 diabetes mellitus with hyperglycemia: Secondary | ICD-10-CM

## 2016-02-07 DIAGNOSIS — IMO0002 Reserved for concepts with insufficient information to code with codable children: Secondary | ICD-10-CM

## 2016-02-07 DIAGNOSIS — E118 Type 2 diabetes mellitus with unspecified complications: Principal | ICD-10-CM

## 2016-02-08 ENCOUNTER — Other Ambulatory Visit (INDEPENDENT_AMBULATORY_CARE_PROVIDER_SITE_OTHER): Payer: BC Managed Care – PPO

## 2016-02-08 DIAGNOSIS — E118 Type 2 diabetes mellitus with unspecified complications: Secondary | ICD-10-CM | POA: Diagnosis not present

## 2016-02-08 DIAGNOSIS — E1165 Type 2 diabetes mellitus with hyperglycemia: Secondary | ICD-10-CM | POA: Diagnosis not present

## 2016-02-08 DIAGNOSIS — IMO0002 Reserved for concepts with insufficient information to code with codable children: Secondary | ICD-10-CM

## 2016-02-08 LAB — COMPREHENSIVE METABOLIC PANEL
ALT: 17 U/L (ref 0–53)
AST: 14 U/L (ref 0–37)
Albumin: 3.5 g/dL (ref 3.5–5.2)
Alkaline Phosphatase: 73 U/L (ref 39–117)
BUN: 16 mg/dL (ref 6–23)
CO2: 32 mEq/L (ref 19–32)
Calcium: 9.6 mg/dL (ref 8.4–10.5)
Chloride: 98 mEq/L (ref 96–112)
Creatinine, Ser: 0.93 mg/dL (ref 0.40–1.50)
GFR: 111.18 mL/min (ref >60.00)
Glucose, Bld: 254 mg/dL — ABNORMAL HIGH (ref 70–99)
Potassium: 4.6 mEq/L (ref 3.5–5.1)
Sodium: 136 mEq/L (ref 135–145)
Total Bilirubin: 0.5 mg/dL (ref 0.2–1.2)
Total Protein: 7.3 g/dL (ref 6.0–8.3)

## 2016-02-08 LAB — HEMOGLOBIN A1C: Hgb A1c MFr Bld: 11.9 % — ABNORMAL HIGH (ref 4.6–6.5)

## 2016-02-08 LAB — LIPID PANEL
Cholesterol: 205 mg/dL — ABNORMAL HIGH (ref 0–200)
HDL: 45.4 mg/dL (ref >39.00)
LDL Cholesterol: 137 mg/dL — ABNORMAL HIGH (ref 0–99)
NonHDL: 159.34
Total CHOL/HDL Ratio: 5
Triglycerides: 111 mg/dL (ref 0.0–149.0)
VLDL: 22.2 mg/dL (ref 0.0–40.0)

## 2016-02-11 ENCOUNTER — Encounter: Payer: Self-pay | Admitting: Nurse Practitioner

## 2016-02-11 ENCOUNTER — Ambulatory Visit (INDEPENDENT_AMBULATORY_CARE_PROVIDER_SITE_OTHER): Payer: BC Managed Care – PPO | Admitting: Family Medicine

## 2016-02-11 ENCOUNTER — Encounter: Payer: Self-pay | Admitting: Family Medicine

## 2016-02-11 DIAGNOSIS — E118 Type 2 diabetes mellitus with unspecified complications: Secondary | ICD-10-CM | POA: Diagnosis not present

## 2016-02-11 DIAGNOSIS — E1165 Type 2 diabetes mellitus with hyperglycemia: Secondary | ICD-10-CM

## 2016-02-11 MED ORDER — INSULIN PEN NEEDLE 30G X 5 MM MISC: 99 refills | Status: DC

## 2016-02-11 MED ORDER — INSULIN GLARGINE 100 UNIT/ML SOLOSTAR PEN: PEN_INJECTOR | SUBCUTANEOUS | 99 refills | Status: DC

## 2016-02-11 MED ORDER — CARVEDILOL 3.125 MG PO TABS: 3.1250 mg | ORAL_TABLET | Freq: Two times a day (BID) | ORAL | 1 refills | Status: DC

## 2016-02-11 MED ORDER — FUROSEMIDE 40 MG PO TABS: 20.0000 mg | ORAL_TABLET | Freq: Every day | ORAL | 1 refills | Status: DC

## 2016-02-11 NOTE — Progress Notes (Signed)
Diabetes:  Using medications without difficulties: currently yes, but undertreated.  D/w pt.  Hypoglycemic episodes:no Hyperglycemic episodes:yes Feet problems:no A1c and labs d/w pt.   PMH and SH reviewed  Meds, vitals, and allergies reviewed.   ROS: Per HPI unless specifically indicated in ROS section   GEN: nad, alert and oriented HEENT: mucous membranes moist NECK: supple w/o LA CV: rrr. PULM: ctab, no inc wob ABD: soft, +bs EXT: no edema SKIN: no acute rash

## 2016-02-11 NOTE — Patient Instructions (Addendum)
Recheck in about 2 weeks.  Morning appointment.   Restart insulin.  Update me as needed in the meantime.  Get scheduled with cardiology.  Take care.  Glad to see you.

## 2016-02-11 NOTE — Progress Notes (Signed)
Pre visit review using our clinic review tool, if applicable. No additional management support is needed unless otherwise documented below in the visit note. 

## 2016-02-13 NOTE — Assessment & Plan Note (Signed)
Detailed conversation with patient. Discussed with him about diabetes and diet and exercise and medications. See after visit summary. See med orders. Restart insulin. He will gradually titrate up his insulin to try to avoid hypoglycemia. All of this was discussed with patient.  We talked about stress eating. He admits to comfort eating. I asked him to think about what he was eating and why he was eating when he takes his meals and snacks future. He thinks he'll do better with a relative short-term follow-up. Recheck in about 2 weeks. At this point still okay for outpatient follow-up. He was getting ready to run out of some BP medicines so I sent short-term refills for those. He is going to check on follow-up at the cardiac clinic. I need him to work on compliance with diet and weight loss and medicine use. He appeared to be committed to the plan. I appreciate his effort going forward. >25 minutes spent in face to face time with patient, >50% spent in counselling or coordination of care.

## 2016-02-14 ENCOUNTER — Ambulatory Visit: Payer: BC Managed Care – PPO | Admitting: Primary Care

## 2016-02-21 ENCOUNTER — Encounter: Payer: Self-pay | Admitting: Family Medicine

## 2016-02-22 ENCOUNTER — Other Ambulatory Visit: Payer: Self-pay | Admitting: Family Medicine

## 2016-02-22 MED ORDER — BASAGLAR KWIKPEN 100 UNIT/ML ~~LOC~~ SOPN: PEN_INJECTOR | SUBCUTANEOUS | 99 refills | Status: DC

## 2016-02-25 ENCOUNTER — Encounter: Payer: Self-pay | Admitting: Family Medicine

## 2016-02-25 ENCOUNTER — Ambulatory Visit (INDEPENDENT_AMBULATORY_CARE_PROVIDER_SITE_OTHER): Payer: BC Managed Care – PPO | Admitting: Family Medicine

## 2016-02-25 DIAGNOSIS — E1165 Type 2 diabetes mellitus with hyperglycemia: Secondary | ICD-10-CM | POA: Diagnosis not present

## 2016-02-25 DIAGNOSIS — E118 Type 2 diabetes mellitus with unspecified complications: Secondary | ICD-10-CM

## 2016-02-25 MED ORDER — LOSARTAN POTASSIUM 25 MG PO TABS: 12.5000 mg | ORAL_TABLET | Freq: Every day | ORAL | 2 refills | Status: DC

## 2016-02-25 MED ORDER — BASAGLAR KWIKPEN 100 UNIT/ML ~~LOC~~ SOPN: PEN_INJECTOR | SUBCUTANEOUS | 99 refills | Status: DC

## 2016-02-25 NOTE — Progress Notes (Signed)
Pre visit review using our clinic review tool, if applicable. No additional management support is needed unless otherwise documented below in the visit note. 

## 2016-02-25 NOTE — Progress Notes (Signed)
He is currently off losartan.  He is trying to get set up with cardiology.    Diabetes:  He just got the insulin, hasn't been able to start yet.   Has been on metformin.  No ADE on med.  Checking sugar at home- in the meantime has been 200s, occ lower 150-200.   Has been working on diet. He had his boots on when he weighted today.  Weight is likely similar to prev.  He has been walking about ~1.5 mile daily.  He is trying to get up to 2 miles in a day on the treadmill.   D/w pt about insulin dosing- see med list.    Meds, vitals, and allergies reviewed.   ROS: Per HPI unless specifically indicated in ROS section   GEN: nad, alert and oriented HEENT: mucous membranes moist NECK: supple w/o LA CV: rrr. PULM: ctab, no inc wob ABD: soft, +bs EXT: no edema

## 2016-02-25 NOTE — Patient Instructions (Addendum)
See insulin dosing.  Start back on 1/2 tab of losartan.  Increase to 1 tab after 1 week of tolerated and not lightheaded.  If lightheaded, then cut back on the dose.   Recheck in about 2-3 weeks. Take care.  Glad to see you.  Thank you for your effort.  Happy thanksgiving.

## 2016-02-26 NOTE — Assessment & Plan Note (Signed)
D/w pt about insulin dosing- see med list, see after visit summary. Continue work on diet and exercise. Discussed with patient about starting losartan at 12.5 mg a day. Increase to 25 mg if tolerated. He will check on follow-up with cardiology. Continue work on weight loss. Recheck in a few weeks. He agrees. Update me as needed.

## 2016-03-21 ENCOUNTER — Ambulatory Visit: Payer: BC Managed Care – PPO | Admitting: Family Medicine

## 2016-03-21 ENCOUNTER — Other Ambulatory Visit: Payer: BC Managed Care – PPO

## 2016-03-28 ENCOUNTER — Encounter: Payer: BC Managed Care – PPO | Admitting: Family Medicine

## 2016-03-28 ENCOUNTER — Ambulatory Visit: Payer: BC Managed Care – PPO | Admitting: Family Medicine

## 2016-04-04 ENCOUNTER — Ambulatory Visit: Payer: BC Managed Care – PPO | Admitting: Family Medicine

## 2016-04-04 DIAGNOSIS — Z0289 Encounter for other administrative examinations: Secondary | ICD-10-CM

## 2016-07-27 ENCOUNTER — Telehealth: Payer: Self-pay | Admitting: Family Medicine

## 2016-07-27 DIAGNOSIS — E119 Type 2 diabetes mellitus without complications: Secondary | ICD-10-CM

## 2016-07-27 NOTE — Telephone Encounter (Signed)
Patient has an appointment on 08/01/16 for a diabetic follow up.  Does patient need to come in for lab work before the appointment?

## 2016-07-27 NOTE — Telephone Encounter (Signed)
Please.  Labs ordered.  Thanks.

## 2016-07-27 NOTE — Telephone Encounter (Signed)
Lab appt scheduled.

## 2016-07-28 ENCOUNTER — Ambulatory Visit: Payer: BC Managed Care – PPO | Admitting: Family Medicine

## 2016-07-28 ENCOUNTER — Other Ambulatory Visit (INDEPENDENT_AMBULATORY_CARE_PROVIDER_SITE_OTHER): Payer: BC Managed Care – PPO

## 2016-07-28 DIAGNOSIS — E119 Type 2 diabetes mellitus without complications: Secondary | ICD-10-CM | POA: Diagnosis not present

## 2016-07-28 LAB — BASIC METABOLIC PANEL
BUN: 13 mg/dL (ref 6–23)
CO2: 31 mEq/L (ref 19–32)
Calcium: 9.6 mg/dL (ref 8.4–10.5)
Chloride: 99 mEq/L (ref 96–112)
Creatinine, Ser: 0.97 mg/dL (ref 0.40–1.50)
GFR: 105.7 mL/min (ref >60.00)
Glucose, Bld: 289 mg/dL — ABNORMAL HIGH (ref 70–99)
Potassium: 5.3 mEq/L — ABNORMAL HIGH (ref 3.5–5.1)
Sodium: 135 mEq/L (ref 135–145)

## 2016-07-28 LAB — HEMOGLOBIN A1C: Hgb A1c MFr Bld: 12.8 % — ABNORMAL HIGH (ref 4.6–6.5)

## 2016-08-01 ENCOUNTER — Encounter: Payer: Self-pay | Admitting: Family Medicine

## 2016-08-01 ENCOUNTER — Ambulatory Visit (INDEPENDENT_AMBULATORY_CARE_PROVIDER_SITE_OTHER): Payer: BC Managed Care – PPO | Admitting: Family Medicine

## 2016-08-01 ENCOUNTER — Telehealth: Payer: Self-pay

## 2016-08-01 DIAGNOSIS — E1165 Type 2 diabetes mellitus with hyperglycemia: Secondary | ICD-10-CM

## 2016-08-01 DIAGNOSIS — I429 Cardiomyopathy, unspecified: Secondary | ICD-10-CM

## 2016-08-01 DIAGNOSIS — E118 Type 2 diabetes mellitus with unspecified complications: Secondary | ICD-10-CM | POA: Diagnosis not present

## 2016-08-01 MED ORDER — METFORMIN HCL ER 500 MG PO TB24: 500.0000 mg | ORAL_TABLET | Freq: Two times a day (BID) | ORAL | 12 refills | Status: DC

## 2016-08-01 MED ORDER — GLUCOSE BLOOD VI STRP: ORAL_STRIP | 3 refills | Status: DC

## 2016-08-01 MED ORDER — ONETOUCH ULTRA SYSTEM W/DEVICE KIT
PACK | 0 refills | Status: DC
Start: 2016-08-01 — End: 2021-03-23

## 2016-08-01 NOTE — Progress Notes (Signed)
Diabetes:  Using medications without difficulties: had been off diab and BP meds for the last few months.  Off insulin.   Hypoglycemic episodes: no Hyperglycemic episodes: yes Feet problems: no Blood Sugars averaging: >200 recently.   Labs d/w pt.    He has been walking on the treadmill for the last month.   A1c d/w pt.    D/w pt about cardiomyopathy hx and need for his rx'd meds.  He understood.    PMH and SH reviewed  Meds, vitals, and allergies reviewed.   ROS: Per HPI unless specifically indicated in ROS section   GEN: nad, alert and oriented HEENT: mucous membranes moist NECK: supple w/o LA CV: rrr. PULM: ctab, no inc wob ABD: soft, +bs EXT: no edema SKIN: no acute rash  Diabetic foot exam: Normal inspection No skin breakdown No calluses  Normal DP pulses Normal sensation to light touch and monofilament Nails normal

## 2016-08-01 NOTE — Progress Notes (Signed)
Pre visit review using our clinic review tool, if applicable. No additional management support is needed unless otherwise documented below in the visit note. 

## 2016-08-01 NOTE — Patient Instructions (Signed)
Restart metformin, then restart insulin 1 week later.  Restart losartan, then add on carvedilol after about 1 week.  Take care.  Glad to see you.  Recheck in about 3 months, labs ahead of time.  Update me about your sugar and insulin dose in about 2 weeks.

## 2016-08-01 NOTE — Telephone Encounter (Signed)
Pt was seen earlier today and took hand written rx for glucose meter and test strips; pt said he uses onetouch. I spoke with Tobi Bastos at [employer] and she said she would take verbal one touch ultra device with one touch ultra test strips # 100 x 3 per Dr Lianne Bushy rx. Pt checking BS bid and as directed.

## 2016-08-02 NOTE — Assessment & Plan Note (Signed)
Off medications. Needs to restart. Restart metformin once a day, then increase to twice a day. Then add back insulin. Check sugar daily and keep adding 1 unit of insulin per day until sugar is improved. I want him to repeat A1c before office visit in about 3 months. I want him to update me in the meantime. He agrees. Okay for outpatient follow-up. He understood the plan.

## 2016-08-02 NOTE — Assessment & Plan Note (Signed)
Discussed with patient about rationale for medication. He needs to restart losartan at 12.5 mg a day and then add back Coreg if tolerated. He understood. Update me as needed. >25 minutes spent in face to face time with patient, >50% spent in counselling or coordination of care.

## 2016-10-30 ENCOUNTER — Other Ambulatory Visit: Payer: Self-pay | Admitting: Family Medicine

## 2016-10-30 DIAGNOSIS — E118 Type 2 diabetes mellitus with unspecified complications: Principal | ICD-10-CM

## 2016-10-30 DIAGNOSIS — E1165 Type 2 diabetes mellitus with hyperglycemia: Secondary | ICD-10-CM

## 2016-11-03 ENCOUNTER — Other Ambulatory Visit: Payer: BC Managed Care – PPO

## 2016-11-07 ENCOUNTER — Ambulatory Visit: Payer: BC Managed Care – PPO | Admitting: Family Medicine

## 2016-12-05 ENCOUNTER — Other Ambulatory Visit (INDEPENDENT_AMBULATORY_CARE_PROVIDER_SITE_OTHER): Payer: BC Managed Care – PPO

## 2016-12-05 DIAGNOSIS — E1165 Type 2 diabetes mellitus with hyperglycemia: Secondary | ICD-10-CM | POA: Diagnosis not present

## 2016-12-05 DIAGNOSIS — E118 Type 2 diabetes mellitus with unspecified complications: Secondary | ICD-10-CM | POA: Diagnosis not present

## 2016-12-05 LAB — COMPREHENSIVE METABOLIC PANEL
ALT: 25 U/L (ref 0–53)
AST: 26 U/L (ref 0–37)
Albumin: 3.6 g/dL (ref 3.5–5.2)
Alkaline Phosphatase: 53 U/L (ref 39–117)
BUN: 14 mg/dL (ref 6–23)
CO2: 31 mEq/L (ref 19–32)
Calcium: 9.3 mg/dL (ref 8.4–10.5)
Chloride: 101 mEq/L (ref 96–112)
Creatinine, Ser: 1.01 mg/dL (ref 0.40–1.50)
GFR: 100.73 mL/min (ref >60.00)
Glucose, Bld: 153 mg/dL — ABNORMAL HIGH (ref 70–99)
Potassium: 4.3 mEq/L (ref 3.5–5.1)
Sodium: 136 mEq/L (ref 135–145)
Total Bilirubin: 0.5 mg/dL (ref 0.2–1.2)
Total Protein: 7.3 g/dL (ref 6.0–8.3)

## 2016-12-05 LAB — LIPID PANEL
Cholesterol: 226 mg/dL — ABNORMAL HIGH (ref 0–200)
HDL: 42.6 mg/dL (ref >39.00)
LDL Cholesterol: 156 mg/dL — ABNORMAL HIGH (ref 0–99)
NonHDL: 183.09
Total CHOL/HDL Ratio: 5
Triglycerides: 135 mg/dL (ref 0.0–149.0)
VLDL: 27 mg/dL (ref 0.0–40.0)

## 2016-12-05 LAB — HEMOGLOBIN A1C: Hgb A1c MFr Bld: 11.9 % — ABNORMAL HIGH (ref 4.6–6.5)

## 2016-12-08 ENCOUNTER — Ambulatory Visit (INDEPENDENT_AMBULATORY_CARE_PROVIDER_SITE_OTHER): Payer: BC Managed Care – PPO | Admitting: Family Medicine

## 2016-12-08 ENCOUNTER — Encounter: Payer: Self-pay | Admitting: Family Medicine

## 2016-12-08 DIAGNOSIS — E1165 Type 2 diabetes mellitus with hyperglycemia: Secondary | ICD-10-CM

## 2016-12-08 DIAGNOSIS — E118 Type 2 diabetes mellitus with unspecified complications: Secondary | ICD-10-CM

## 2016-12-08 DIAGNOSIS — I429 Cardiomyopathy, unspecified: Secondary | ICD-10-CM

## 2016-12-08 MED ORDER — INSULIN PEN NEEDLE 30G X 5 MM MISC: 99 refills | Status: DC

## 2016-12-08 MED ORDER — BASAGLAR KWIKPEN 100 UNIT/ML ~~LOC~~ SOPN: PEN_INJECTOR | SUBCUTANEOUS | 99 refills | Status: DC

## 2016-12-08 MED ORDER — CARVEDILOL 3.125 MG PO TABS: 3.1250 mg | ORAL_TABLET | Freq: Two times a day (BID) | ORAL | 5 refills | Status: DC

## 2016-12-08 MED ORDER — LOSARTAN POTASSIUM 25 MG PO TABS: 12.5000 mg | ORAL_TABLET | Freq: Every day | ORAL | 5 refills | Status: DC

## 2016-12-08 NOTE — Progress Notes (Signed)
Diabetes:  Off most meds.  See EMR.  See AVS.  Hypoglycemic episodes: no Hyperglycemic episodes: yes, likely  Feet problems: no Blood Sugars averaging: 180s at the lowest eye exam within last year:  Due, d/w pt.   A1c up.  He has been trying to exercise.    Lipid uncontrolled.  BP elevated.  D/w pt about his comorbid condition and likely outcome with medical noncomliance.   PMH and SH reviewed  Meds, vitals, and allergies reviewed.   ROS: Per HPI unless specifically indicated in ROS section   GEN: nad, alert and oriented HEENT: mucous membranes moist NECK: supple w/o LA CV: rrr. PULM: ctab, no inc wob ABD: soft, +bs EXT: no edema SKIN: no acute rash  Diabetic foot exam: Normal inspection No skin breakdown No calluses  Normal DP pulses Normal sensation to light touch and monofilament Nails normal

## 2016-12-08 NOTE — Patient Instructions (Addendum)
Restart carvedilol, then in 1 week restart losartan.  Start taking 5 units at night and 1 unit per night as listed on the med sheet.  Recheck in about 3 months but let me know about your sugars and BP next week.  Call about an eye and a heart clinic appointment.  Take care.  Glad to see you.

## 2016-12-11 NOTE — Assessment & Plan Note (Signed)
Lipid uncontrolled.  BP elevated.  D/w pt about his comorbid condition and likely outcome with medical noncomliance.  This was a blunt but honest and respectful conversation. He is at high risk for a bad cardiovascular outcome without aggressive treatment. Without following through on treatment he is at high risk for cardiac event or death. He said he understood and he would restart his medication. See after visit summary.

## 2016-12-11 NOTE — Assessment & Plan Note (Addendum)
Would have the patient do the following: Restart carvedilol, then in 1 week restart losartan.  Start taking 5 units at night and 1 unit per night as listed on the med sheet.  Recheck in about 3 months and let me know about his sugars and BP next week.  Call about an eye and a heart clinic appointment.  All discussed with patient. He understood. Foot exam is okay. >25 minutes spent in face to face time with patient, >50% spent in counselling or coordination of care.

## 2017-01-06 ENCOUNTER — Other Ambulatory Visit: Payer: Self-pay | Admitting: Family Medicine

## 2017-03-04 ENCOUNTER — Other Ambulatory Visit: Payer: Self-pay | Admitting: Family Medicine

## 2017-03-04 DIAGNOSIS — E118 Type 2 diabetes mellitus with unspecified complications: Principal | ICD-10-CM

## 2017-03-04 DIAGNOSIS — E1165 Type 2 diabetes mellitus with hyperglycemia: Secondary | ICD-10-CM

## 2017-03-04 DIAGNOSIS — IMO0002 Reserved for concepts with insufficient information to code with codable children: Secondary | ICD-10-CM

## 2017-03-07 ENCOUNTER — Other Ambulatory Visit: Payer: BC Managed Care – PPO

## 2017-03-09 ENCOUNTER — Ambulatory Visit: Payer: BC Managed Care – PPO | Admitting: Family Medicine

## 2017-03-11 ENCOUNTER — Encounter: Payer: Self-pay | Admitting: Family Medicine

## 2017-03-12 ENCOUNTER — Other Ambulatory Visit: Payer: Self-pay | Admitting: *Deleted

## 2017-03-15 ENCOUNTER — Other Ambulatory Visit: Payer: Self-pay | Admitting: Family Medicine

## 2017-03-15 MED ORDER — FUROSEMIDE 40 MG PO TABS: 20.0000 mg | ORAL_TABLET | Freq: Every day | ORAL | 1 refills | Status: DC | PRN

## 2017-03-19 ENCOUNTER — Other Ambulatory Visit: Payer: BC Managed Care – PPO

## 2017-03-23 ENCOUNTER — Ambulatory Visit: Payer: BC Managed Care – PPO | Admitting: Family Medicine

## 2017-03-27 ENCOUNTER — Ambulatory Visit: Payer: BC Managed Care – PPO | Admitting: Family Medicine

## 2017-03-29 ENCOUNTER — Other Ambulatory Visit: Payer: Self-pay | Admitting: *Deleted

## 2017-03-29 MED ORDER — METFORMIN HCL ER 500 MG PO TB24: 500.0000 mg | ORAL_TABLET | Freq: Two times a day (BID) | ORAL | 2 refills | Status: DC

## 2017-04-06 ENCOUNTER — Other Ambulatory Visit: Payer: Self-pay | Admitting: *Deleted

## 2017-04-09 ENCOUNTER — Other Ambulatory Visit: Payer: BC Managed Care – PPO

## 2017-04-12 ENCOUNTER — Ambulatory Visit: Payer: BC Managed Care – PPO | Admitting: Family Medicine

## 2017-04-12 ENCOUNTER — Other Ambulatory Visit: Payer: Self-pay | Admitting: *Deleted

## 2017-04-12 MED ORDER — FUROSEMIDE 40 MG PO TABS: 20.0000 mg | ORAL_TABLET | Freq: Every day | ORAL | 1 refills | Status: DC | PRN

## 2017-04-18 ENCOUNTER — Other Ambulatory Visit (INDEPENDENT_AMBULATORY_CARE_PROVIDER_SITE_OTHER): Payer: BC Managed Care – PPO

## 2017-04-18 DIAGNOSIS — E1165 Type 2 diabetes mellitus with hyperglycemia: Secondary | ICD-10-CM

## 2017-04-18 DIAGNOSIS — E118 Type 2 diabetes mellitus with unspecified complications: Secondary | ICD-10-CM

## 2017-04-18 DIAGNOSIS — IMO0002 Reserved for concepts with insufficient information to code with codable children: Secondary | ICD-10-CM

## 2017-04-18 LAB — HEMOGLOBIN A1C: Hgb A1c MFr Bld: 11.9 % — ABNORMAL HIGH (ref 4.6–6.5)

## 2017-04-18 LAB — BASIC METABOLIC PANEL
BUN: 16 mg/dL (ref 6–23)
CO2: 31 mEq/L (ref 19–32)
Calcium: 9.6 mg/dL (ref 8.4–10.5)
Chloride: 96 mEq/L (ref 96–112)
Creatinine, Ser: 0.97 mg/dL (ref 0.40–1.50)
GFR: 105.38 mL/min (ref >60.00)
Glucose, Bld: 286 mg/dL — ABNORMAL HIGH (ref 70–99)
Potassium: 4.2 mEq/L (ref 3.5–5.1)
Sodium: 134 mEq/L — ABNORMAL LOW (ref 135–145)

## 2017-04-20 ENCOUNTER — Encounter: Payer: Self-pay | Admitting: Family Medicine

## 2017-04-20 ENCOUNTER — Ambulatory Visit: Payer: BC Managed Care – PPO | Admitting: Family Medicine

## 2017-04-20 VITALS — BP 152/98 | HR 87 | Temp 99.2°F | Wt 351.5 lb

## 2017-04-20 DIAGNOSIS — E118 Type 2 diabetes mellitus with unspecified complications: Secondary | ICD-10-CM

## 2017-04-20 DIAGNOSIS — E1165 Type 2 diabetes mellitus with hyperglycemia: Secondary | ICD-10-CM | POA: Diagnosis not present

## 2017-04-20 DIAGNOSIS — IMO0002 Reserved for concepts with insufficient information to code with codable children: Secondary | ICD-10-CM

## 2017-04-20 MED ORDER — LOSARTAN POTASSIUM 25 MG PO TABS: 25.0000 mg | ORAL_TABLET | Freq: Every day | ORAL | 3 refills | Status: DC

## 2017-04-20 NOTE — Progress Notes (Signed)
Diabetes:  Using medications without difficulties: yes, but he wasn't consistent with insulin.  He is frustrated about the whole issue with DM and work, etc.  Usually taking 10 units per day, when used.   Hypoglycemic episodes: no sx  Hyperglycemic episodes: no sx Feet problems: no Blood Sugars averaging: not checked.  eye exam within last year: encouraged.   Labs d/w pt.    His work situation is not improved, with noted stressors related to his employer's troubles [employer] Mura).  His daughter is in school at [employer]- she is doing well.  Discussed.  Per patient "I built my life working around [employer] and that is all changing."    Meds, vitals, and allergies reviewed.   ROS: Per HPI unless specifically indicated in ROS section   GEN: nad, alert and oriented HEENT: mucous membranes moist NECK: supple w/o LA CV: rrr. PULM: ctab, no inc wob ABD: soft, +bs EXT: no edema

## 2017-04-20 NOTE — Patient Instructions (Signed)
Thanks for your effort.  Try to get back on the insulin daily.  Update me in about 2 weeks.  Plan on a recheck in about 3 months.  Update me as needed in the meantime.

## 2017-04-22 NOTE — Assessment & Plan Note (Signed)
He is frustrated about the whole issue with DM and work, etc.  Usually taking 10 units per day, when used.  D/w pt about compensation strategies in the meantime and he'll update me as needed. D/w pt about restart of meds and adherence in general.

## 2017-05-07 ENCOUNTER — Telehealth: Payer: Self-pay | Admitting: Family Medicine

## 2017-05-07 NOTE — Telephone Encounter (Signed)
Patient says things are going well although there are good and bad days but he is optimistic and upbeat.  Sugars are running between 165-168.

## 2017-05-07 NOTE — Telephone Encounter (Signed)
Please get update on patient in general and about DM2.  Thanks.

## 2017-05-07 NOTE — Telephone Encounter (Signed)
Many thanks. He has f/u scheduled.

## 2017-06-17 ENCOUNTER — Other Ambulatory Visit: Payer: Self-pay | Admitting: Family Medicine

## 2017-07-02 ENCOUNTER — Other Ambulatory Visit: Payer: Self-pay | Admitting: *Deleted

## 2017-07-02 MED ORDER — CARVEDILOL 3.125 MG PO TABS: 3.1250 mg | ORAL_TABLET | Freq: Two times a day (BID) | ORAL | 1 refills | Status: DC

## 2017-07-17 ENCOUNTER — Telehealth: Payer: Self-pay | Admitting: *Deleted

## 2017-07-17 NOTE — Telephone Encounter (Signed)
Copied from CRM [id number]. Topic: Appointment Scheduling - Scheduling Inquiry for Clinic >> Jul 17, 2017 10:45 AM Oneal Grout wrote: Reason for CRM: Patient scheduled to see Dr Para March on 07/20/17, requesting labs prior

## 2017-07-18 ENCOUNTER — Other Ambulatory Visit (INDEPENDENT_AMBULATORY_CARE_PROVIDER_SITE_OTHER): Payer: BC Managed Care – PPO

## 2017-07-18 ENCOUNTER — Other Ambulatory Visit: Payer: Self-pay | Admitting: Family Medicine

## 2017-07-18 DIAGNOSIS — E118 Type 2 diabetes mellitus with unspecified complications: Secondary | ICD-10-CM

## 2017-07-18 DIAGNOSIS — IMO0002 Reserved for concepts with insufficient information to code with codable children: Secondary | ICD-10-CM

## 2017-07-18 DIAGNOSIS — E1165 Type 2 diabetes mellitus with hyperglycemia: Secondary | ICD-10-CM | POA: Diagnosis not present

## 2017-07-18 DIAGNOSIS — Z0000 Encounter for general adult medical examination without abnormal findings: Secondary | ICD-10-CM

## 2017-07-18 LAB — BASIC METABOLIC PANEL
BUN: 20 mg/dL (ref 6–23)
CO2: 29 mEq/L (ref 19–32)
Calcium: 9.6 mg/dL (ref 8.4–10.5)
Chloride: 100 mEq/L (ref 96–112)
Creatinine, Ser: 1.15 mg/dL (ref 0.40–1.50)
GFR: 86.5 mL/min (ref >60.00)
Glucose, Bld: 158 mg/dL — ABNORMAL HIGH (ref 70–99)
Potassium: 4.7 mEq/L (ref 3.5–5.1)
Sodium: 138 mEq/L (ref 135–145)

## 2017-07-18 LAB — HEMOGLOBIN A1C: Hgb A1c MFr Bld: 9.6 % — ABNORMAL HIGH (ref 4.6–6.5)

## 2017-07-20 ENCOUNTER — Ambulatory Visit: Payer: BC Managed Care – PPO | Admitting: Family Medicine

## 2017-07-20 ENCOUNTER — Encounter: Payer: Self-pay | Admitting: Family Medicine

## 2017-07-20 DIAGNOSIS — IMO0002 Reserved for concepts with insufficient information to code with codable children: Secondary | ICD-10-CM

## 2017-07-20 DIAGNOSIS — E118 Type 2 diabetes mellitus with unspecified complications: Secondary | ICD-10-CM

## 2017-07-20 DIAGNOSIS — E1165 Type 2 diabetes mellitus with hyperglycemia: Secondary | ICD-10-CM

## 2017-07-20 MED ORDER — METFORMIN HCL ER 500 MG PO TB24: 1500.0000 mg | ORAL_TABLET | Freq: Every day | ORAL | Status: DC

## 2017-07-20 NOTE — Progress Notes (Signed)
Diabetes:  Using medications without difficulties:yes, except off insulin.   Hypoglycemic episodes: only if prolonged fasting but only down to ~110 Hyperglycemic episodes:no Feet problems:no Blood Sugars averaging:~170s eye exam within last year:  Due,d/w pt.   A1c not at goal but better.  Down 2+ points.   Weight is down 20 lbs.  D/w pt.  He is working on diet and exercise.  He is working on intention, ie considering how much he should be eating, etc.  He is trying with diet.   He is taking 3 metformin.  He is off insulin.  D/w pt.    He is off BP meds.  D/w pt.    Work d/w pt.  [employer]het[employer] M[employer] if had sig financial troubles and this affected his work situation.  He is still working and trying to adjust to the situation.    Meds, vitals, and allergies reviewed.   ROS: Per HPI unless specifically indicated in ROS section   GEN: nad, alert and oriented HEENT: mucous membranes moist NECK: supple w/o LA CV: rrr. PULM: ctab, no inc wob ABD: soft, +bs EXT: no edema SKIN: well perfused.

## 2017-07-20 NOTE — Patient Instructions (Addendum)
Try taking 2 metformin in the AM and 1 in the PM.  Then try 3 metformin in the AM.   Add back the losartan in the AM.   Recheck in about 3 months with labs prior to a physical.   Take care.  Glad to see you.

## 2017-07-21 NOTE — Assessment & Plan Note (Signed)
His A1c is significantly better.  Discussed with patient.  He is working on his diet.  He cheats on some meals but out of 21 meals per week he usually does pretty well on around 17.  He has intentional weight loss.  We talked about med options.  He is off his blood pressure/cardiac medications.  He is off insulin. At this point it is reasonable to try the following- try taking 2 metformin in the AM and 1 in the PM.  Then try 3 metformin in the AM.  That would limit the number of times per day he had to take medication. Add back the losartan in the AM.   Recheck in about 3 months with labs prior to a physical.   Continue work on diet and exercise in the meantime.  He agrees.  Okay for outpatient follow-up.

## 2017-08-12 ENCOUNTER — Other Ambulatory Visit: Payer: Self-pay | Admitting: Family Medicine

## 2017-08-13 MED ORDER — METFORMIN HCL ER 500 MG PO TB24: 1500.0000 mg | ORAL_TABLET | Freq: Every day | ORAL | 5 refills | Status: DC

## 2017-10-08 ENCOUNTER — Encounter: Payer: Self-pay | Admitting: Family Medicine

## 2017-10-08 ENCOUNTER — Other Ambulatory Visit: Payer: Self-pay | Admitting: Family Medicine

## 2017-10-08 DIAGNOSIS — IMO0002 Reserved for concepts with insufficient information to code with codable children: Secondary | ICD-10-CM

## 2017-10-08 DIAGNOSIS — E1165 Type 2 diabetes mellitus with hyperglycemia: Secondary | ICD-10-CM

## 2017-10-08 DIAGNOSIS — E118 Type 2 diabetes mellitus with unspecified complications: Principal | ICD-10-CM

## 2017-10-08 DIAGNOSIS — Z125 Encounter for screening for malignant neoplasm of prostate: Secondary | ICD-10-CM

## 2017-10-19 ENCOUNTER — Other Ambulatory Visit (INDEPENDENT_AMBULATORY_CARE_PROVIDER_SITE_OTHER): Payer: BC Managed Care – PPO

## 2017-10-19 DIAGNOSIS — E1165 Type 2 diabetes mellitus with hyperglycemia: Secondary | ICD-10-CM

## 2017-10-19 DIAGNOSIS — Z125 Encounter for screening for malignant neoplasm of prostate: Secondary | ICD-10-CM

## 2017-10-19 DIAGNOSIS — E118 Type 2 diabetes mellitus with unspecified complications: Secondary | ICD-10-CM

## 2017-10-19 DIAGNOSIS — IMO0002 Reserved for concepts with insufficient information to code with codable children: Secondary | ICD-10-CM

## 2017-10-19 LAB — COMPREHENSIVE METABOLIC PANEL
ALT: 23 U/L (ref 0–53)
AST: 26 U/L (ref 0–37)
Albumin: 3.7 g/dL (ref 3.5–5.2)
Alkaline Phosphatase: 74 U/L (ref 39–117)
BUN: 18 mg/dL (ref 6–23)
CO2: 29 mEq/L (ref 19–32)
Calcium: 9.4 mg/dL (ref 8.4–10.5)
Chloride: 100 mEq/L (ref 96–112)
Creatinine, Ser: 1.01 mg/dL (ref 0.40–1.50)
GFR: 100.38 mL/min (ref >60.00)
Glucose, Bld: 154 mg/dL — ABNORMAL HIGH (ref 70–99)
Potassium: 4.6 mEq/L (ref 3.5–5.1)
Sodium: 139 mEq/L (ref 135–145)
Total Bilirubin: 0.4 mg/dL (ref 0.2–1.2)
Total Protein: 7 g/dL (ref 6.0–8.3)

## 2017-10-19 LAB — LIPID PANEL
Cholesterol: 220 mg/dL — ABNORMAL HIGH (ref 0–200)
HDL: 51.9 mg/dL (ref >39.00)
LDL Cholesterol: 142 mg/dL — ABNORMAL HIGH (ref 0–99)
NonHDL: 167.76
Total CHOL/HDL Ratio: 4
Triglycerides: 130 mg/dL (ref 0.0–149.0)
VLDL: 26 mg/dL (ref 0.0–40.0)

## 2017-10-19 LAB — HEMOGLOBIN A1C: Hgb A1c MFr Bld: 9 % — ABNORMAL HIGH (ref 4.6–6.5)

## 2017-10-19 LAB — PSA: PSA: 0.72 ng/mL (ref 0.10–4.00)

## 2017-10-23 ENCOUNTER — Ambulatory Visit (INDEPENDENT_AMBULATORY_CARE_PROVIDER_SITE_OTHER): Payer: BC Managed Care – PPO | Admitting: Family Medicine

## 2017-10-23 ENCOUNTER — Telehealth: Payer: Self-pay | Admitting: Family Medicine

## 2017-10-23 DIAGNOSIS — E118 Type 2 diabetes mellitus with unspecified complications: Secondary | ICD-10-CM

## 2017-10-23 DIAGNOSIS — E1165 Type 2 diabetes mellitus with hyperglycemia: Secondary | ICD-10-CM

## 2017-10-23 DIAGNOSIS — Z0289 Encounter for other administrative examinations: Secondary | ICD-10-CM

## 2017-10-23 NOTE — Progress Notes (Signed)
Opened in error

## 2017-10-23 NOTE — Assessment & Plan Note (Signed)
Rescheduled

## 2017-10-23 NOTE — Telephone Encounter (Signed)
Called patient.  I was running behind due to patient care. He had rescheduled in the meantime. I apologized and explained the situation w/o disclosing any confidential info and he was really gracious about the whole affair.  I thanked him for taking the call and he thanked me for calling.

## 2017-10-28 ENCOUNTER — Telehealth: Payer: Self-pay | Admitting: Family Medicine

## 2017-10-28 NOTE — Telephone Encounter (Signed)
Please call/notify pt.  I didn't mention his labs on the prev phone call.  His labs were improved- A1c was better.  We'll talk at the rescheduled f/u OV but his A1c was the best it has been in years.  And I appreciate his understanding about rescheduling his appointment.  Thanks.

## 2017-10-29 NOTE — Telephone Encounter (Signed)
Left detailed message on voicemail.  

## 2017-11-20 ENCOUNTER — Encounter: Payer: Self-pay | Admitting: Gastroenterology

## 2017-11-20 ENCOUNTER — Encounter: Payer: Self-pay | Admitting: Family Medicine

## 2017-11-20 ENCOUNTER — Ambulatory Visit (INDEPENDENT_AMBULATORY_CARE_PROVIDER_SITE_OTHER): Payer: BC Managed Care – PPO | Admitting: Family Medicine

## 2017-11-20 VITALS — BP 150/98 | HR 96 | Temp 99.2°F | Ht 70.0 in | Wt 338.5 lb

## 2017-11-20 DIAGNOSIS — I429 Cardiomyopathy, unspecified: Secondary | ICD-10-CM

## 2017-11-20 DIAGNOSIS — E1165 Type 2 diabetes mellitus with hyperglycemia: Secondary | ICD-10-CM

## 2017-11-20 DIAGNOSIS — E118 Type 2 diabetes mellitus with unspecified complications: Secondary | ICD-10-CM

## 2017-11-20 DIAGNOSIS — Z7189 Other specified counseling: Secondary | ICD-10-CM | POA: Insufficient documentation

## 2017-11-20 DIAGNOSIS — Z0000 Encounter for general adult medical examination without abnormal findings: Secondary | ICD-10-CM | POA: Diagnosis not present

## 2017-11-20 DIAGNOSIS — IMO0002 Reserved for concepts with insufficient information to code with codable children: Secondary | ICD-10-CM

## 2017-11-20 DIAGNOSIS — Z1211 Encounter for screening for malignant neoplasm of colon: Secondary | ICD-10-CM

## 2017-11-20 NOTE — Progress Notes (Signed)
CPE- See plan.  Routine anticipatory guidance given to patient.  See health maintenance.  The possibility exists that previously documented standard health maintenance information may have been brought forward from a previous encounter into this note.  If needed, that same information has been updated to reflect the current situation based on today's encounter.    Tetanus 2016 Flu encouraged.   PNA 2016 Shingles not due, d/w pt.   D/w patient TD:[id number] for colon cancer screening, including IFOB vs. colonoscopy.  Risks and benefits of both were discussed and patient voiced understanding.  Pt elects for: colonoscopy.   PSA wnl, d/w pt.   Living will d/w pt.  Wife, mother, sister Karoline Caldwell, and pastor Octaviano Batty equally designated if patient were incapacitated.   Diet and exercise d/w pt.    His daughter is going back to [employer] [facility] [facility] this weekend to start her fall semester.  D/w pt.    We talked about his work situation.    Hypertension:    Using medication without problems or lightheadedness:  yes Chest pain with exertion:no Edema:no Short of breath:no He was hit and miss with med adherence.    He is off BB and lasix.  He didn't recall a specific ADE on BB.    Diabetes:  Using medications without difficulties: generally 3 tabs a day but had missed some doses.   Usually getting metformin about 4-5 days a week.   Hypoglycemic episodes: no sx Hyperglycemic episodes: no sx Feet problems: no Blood Sugars averaging: not checked.  eye exam within last year: due, d/w pt.  Off insulin for about >8 months.   Labs d/w pt.   He is still working on trying to keep meals in check except for disruptions with recent travel. We talked about options to inc med adherence, to make it easier.      PMH and SH reviewed  Meds, vitals, and allergies reviewed.   ROS: Per HPI.  Unless specifically indicated otherwise in HPI, the patient denies:  General: fever. Eyes: acute vision changes ENT: sore  throat Cardiovascular: chest pain Respiratory: SOB GI: vomiting GU: dysuria Musculoskeletal: acute back pain Derm: acute rash Neuro: acute motor dysfunction Psych: worsening mood Endocrine: polydipsia Heme: bleeding Allergy: hayfever  GEN: nad, alert and oriented HEENT: mucous membranes moist NECK: supple w/o LA CV: rrr. PULM: ctab, no inc wob ABD: soft, +bs EXT: trace BLE edema SKIN: no acute rash  Diabetic foot exam: Normal inspection No skin breakdown No calluses  Normal DP pulses Normal sensation to light touch and monofilament Nails normal

## 2017-11-20 NOTE — Patient Instructions (Addendum)
I have your wife, mother, sister Karoline Caldwell, and pastor Octaviano Batty equally designated if you were incapacitated.   Let them know.    Get a pill box or similar and see about getting more consistency with the metformin and then losartan.    We will call about your referral.  Shirlee Limerick or Alvina Chou will call you if you don't see one of them on the way out.   Recheck in about 3 months.  The only lab you need to have done for your next diabetic visit is an A1c.  We can do this with a fingerstick test at the office visit.  You do not need a lab visit ahead of time for this.  It does not matter if you are fasting when the lab is done.    Take care.  Glad to see you.  Thanks for your effort.

## 2017-11-20 NOTE — Assessment & Plan Note (Signed)
Living will d/w pt.  Wife, mother, sister Karoline Caldwell, and pastor Octaviano Batty equally designated if patient were incapacitated.

## 2017-11-20 NOTE — Assessment & Plan Note (Signed)
He is off BB and lasix.  He didn't recall a specific ADE on BB.   We talked about options to inc med adherence.  He commits to working on that.  Would try to add back the ARB first and then potentially add back BB.  D/w pt.  He agrees.

## 2017-11-20 NOTE — Assessment & Plan Note (Signed)
He is still working on trying to keep meals in check except for disruptions with recent travel. We talked about options to inc med adherence, to make it easier.  He can likely inc his metformin use to daily and that along with diet and exercise should help with A1c.  He has made sig progress with A1c and we talked about that.   Recheck in about 3 months.   Routine foot cautions d/w pt.  He agrees with plan.  See AVS.

## 2017-11-20 NOTE — Assessment & Plan Note (Signed)
Tetanus 2016 Flu encouraged.   PNA 2016 Shingles not due, d/w pt.   D/w patient VV:[id number] for colon cancer screening, including IFOB vs. colonoscopy.  Risks and benefits of both were discussed and patient voiced understanding.  Pt elects for: colonoscopy.   PSA wnl, d/w pt.   Living will d/w pt.  Wife, mother, sister Karoline Caldwell, and pastor Octaviano Batty equally designated if patient were incapacitated.   Diet and exercise d/w pt.

## 2017-11-22 ENCOUNTER — Ambulatory Visit (AMBULATORY_SURGERY_CENTER): Payer: Self-pay

## 2017-11-22 VITALS — Ht 70.0 in | Wt 335.6 lb

## 2017-11-22 DIAGNOSIS — Z1211 Encounter for screening for malignant neoplasm of colon: Secondary | ICD-10-CM

## 2017-11-22 MED ORDER — NA SULFATE-K SULFATE-MG SULF 17.5-3.13-1.6 GM/177ML PO SOLN: 1.0000 | Freq: Once | ORAL | 0 refills | Status: DC

## 2017-11-22 NOTE — Progress Notes (Signed)
Denies allergies to eggs or soy products. Denies complication of anesthesia or sedation. Denies use of weight loss medication. Denies use of O2.   Emmi instructions declined.  

## 2017-11-23 ENCOUNTER — Encounter: Payer: Self-pay | Admitting: Gastroenterology

## 2017-12-04 ENCOUNTER — Encounter: Payer: Self-pay | Admitting: Gastroenterology

## 2017-12-04 ENCOUNTER — Ambulatory Visit (AMBULATORY_SURGERY_CENTER): Payer: BC Managed Care – PPO | Admitting: Gastroenterology

## 2017-12-04 VITALS — BP 133/78 | HR 76 | Temp 98.2°F | Resp 17 | Ht 70.0 in | Wt 335.0 lb

## 2017-12-04 DIAGNOSIS — Z1211 Encounter for screening for malignant neoplasm of colon: Secondary | ICD-10-CM | POA: Diagnosis not present

## 2017-12-04 MED ORDER — SODIUM CHLORIDE 0.9 % IV SOLN: 500.0000 mL | Freq: Once | INTRAVENOUS | Status: DC

## 2017-12-04 NOTE — Op Note (Signed)
[facility] Patient Name: Austin Shaw Procedure Date: 12/04/2017 10:05 AM MRN: [id number] Endoscopist: Sherilyn Cooter L. Myrtie Neither , MD Age: 52 Referring MD:  Date of Birth: 04-Apr-1966 Gender: Male Account #: [id number] Procedure:                Colonoscopy Indications:              Screening for colorectal malignant neoplasm, This                            is the patient's first colonoscopy Medicines:                Monitored Anesthesia Care Procedure:                Pre-Anesthesia Assessment:                           - Prior to the procedure, a History and Physical                            was performed, and patient medications and                            allergies were reviewed. The patient's tolerance of                            previous anesthesia was also reviewed. The risks                            and benefits of the procedure and the sedation                            options and risks were discussed with the patient.                            All questions were answered, and informed consent                            was obtained. Prior Anticoagulants: The patient has                            taken no previous anticoagulant or antiplatelet                            agents. ASA Grade Assessment: III - A patient with                            severe systemic disease. After reviewing the risks                            and benefits, the patient was deemed in                            satisfactory condition to undergo the procedure.  After obtaining informed consent, the colonoscope                            was passed under direct vision. Throughout the                            procedure, the patient's blood pressure, pulse, and                            oxygen saturations were monitored continuously. The                            Colonoscope was introduced through the anus and                            advanced to the the  cecum, identified by                            appendiceal orifice and ileocecal valve. The                            colonoscopy was performed without difficulty. The                            patient tolerated the procedure well. The quality                            of the bowel preparation was excellent. The                            ileocecal valve, appendiceal orifice, and rectum                            were photographed. The quality of the bowel                            preparation was evaluated using the BBPS [place]                            Bowel Preparation Scale) with scores of: Right                            Colon = 3, Transverse Colon = 3 and Left Colon = 3                            (entire mucosa seen well with no residual staining,                            small fragments of stool or opaque liquid). The                            total BBPS score equals 9. The bowel preparation  used was SUPREP. Scope In: 10:14:30 AM Scope Out: 10:26:34 AM Scope Withdrawal Time: 0 hours 7 minutes 48 seconds  Total Procedure Duration: 0 hours 12 minutes 4 seconds  Findings:                 The perianal and digital rectal examinations were                            normal.                           The entire examined colon appeared normal on direct                            and retroflexion views. Complications:            No immediate complications. Estimated Blood Loss:     Estimated blood loss: none. Impression:               - The entire examined colon is normal on direct and                            retroflexion views.                           - No specimens collected. Recommendation:           - Patient has a contact number available for                            emergencies. The signs and symptoms of potential                            delayed complications were discussed with the                            patient. Return to normal  activities tomorrow.                            Written discharge instructions were provided to the                            patient.                           - Resume previous diet.                           - Continue present medications.                           - Repeat colonoscopy in 10 years for screening                            purposes.  L. Myrtie Neither, MD 12/04/2017 10:28:34 AM This report has been signed electronically.

## 2017-12-04 NOTE — Patient Instructions (Signed)

## 2017-12-04 NOTE — Progress Notes (Signed)
To PACU, VSS. Report to [email or web address] 

## 2017-12-04 NOTE — Progress Notes (Signed)
Pt's states no medical or surgical changes since previsit or office visit. 

## 2017-12-05 ENCOUNTER — Telehealth: Payer: Self-pay

## 2017-12-05 NOTE — Telephone Encounter (Signed)
  Follow up Call-  Call back number 12/04/2017  Post procedure Call Back phone  # [phone]  Permission to leave phone message Yes  Some recent data might be hidden     Patient questions:  Do you have a fever, pain , or abdominal swelling? No. Pain Score  0 *  Have you tolerated food without any problems? Yes.    Have you been able to return to your normal activities? Yes.    Do you have any questions about your discharge instructions: Diet   No. Medications  No. Follow up visit  No.  Do you have questions or concerns about your Care? No.  Actions: * If pain score is 4 or above: No action needed, pain <4.

## 2018-02-26 ENCOUNTER — Encounter: Payer: Self-pay | Admitting: Family Medicine

## 2018-02-26 ENCOUNTER — Ambulatory Visit: Payer: BC Managed Care – PPO | Admitting: Family Medicine

## 2018-02-26 VITALS — BP 158/98 | HR 100 | Temp 99.0°F | Ht 70.0 in | Wt 349.0 lb

## 2018-02-26 DIAGNOSIS — IMO0002 Reserved for concepts with insufficient information to code with codable children: Secondary | ICD-10-CM

## 2018-02-26 DIAGNOSIS — E1165 Type 2 diabetes mellitus with hyperglycemia: Secondary | ICD-10-CM | POA: Diagnosis not present

## 2018-02-26 DIAGNOSIS — E118 Type 2 diabetes mellitus with unspecified complications: Secondary | ICD-10-CM

## 2018-02-26 LAB — BASIC METABOLIC PANEL
BUN: 21 mg/dL (ref 6–23)
CO2: 30 mEq/L (ref 19–32)
Calcium: 9.5 mg/dL (ref 8.4–10.5)
Chloride: 97 mEq/L (ref 96–112)
Creatinine, Ser: 1.17 mg/dL (ref 0.40–1.50)
GFR: 84.59 mL/min (ref >60.00)
Glucose, Bld: 349 mg/dL — ABNORMAL HIGH (ref 70–99)
Potassium: 4.7 mEq/L (ref 3.5–5.1)
Sodium: 135 mEq/L (ref 135–145)

## 2018-02-26 LAB — POCT GLYCOSYLATED HEMOGLOBIN (HGB A1C): Hemoglobin A1C: 11.8 % — AB (ref 4.0–5.6)

## 2018-02-26 NOTE — Progress Notes (Signed)
Diabetes:  Using medications without difficulties: yes Hypoglycemic episodes:no sx Hyperglycemic episodes:no sx Feet problems:no  Blood Sugars averaging: not checked.  A1c up.  D/w pt at OV.    He is in a new job at [employer].  It was a pay cut and a transition.  He was out of [place] for about two weeks prior to starting at [employer].   Mood d/w pt.  He was "melancholy" with the change.  "I need to get to a better space."  He was likely stress eating and we discussed.  [place] [facility] [place] [facility]  He was likely overeating and that was blunting the benefit of exercise.  No SI/Hi.    He was able to exercise this AM.  Walked 3 miles.  No CP, not SOB.   Recheck pulse ox 95%  PMH and SH reviewed Meds, vitals, and allergies reviewed.   ROS: Per HPI unless specifically indicated in ROS section   GEN: nad, alert and oriented HEENT: mucous membranes moist NECK: supple w/o LA CV: tachy but regular.   PULM: ctab, no inc wob ABD: soft, +bs EXT: no edema SKIN: no acute rash

## 2018-02-26 NOTE — Patient Instructions (Addendum)
Go to the lab on the way out.  We'll contact you with your lab report. If you continue to have elevated heart rate >100 at rest, then take an extra dose of carvedilol.    Update me tomorrow if you needed another dose of carvedilol.    Try to stick your diet and update me about that next week.   Take care.  Glad to see you.

## 2018-02-27 NOTE — Assessment & Plan Note (Signed)
The patient has been able to make significant changes in his A1c with diet and exercise in the past.  He clearly had a lot of disruption over the last few months with his job change.  Discussed.  He has been stress eating.  He is still exercising.  We talked about options.  At this point the most important thing is to likely "get back on track" with his diet.  His mood clearly affects this.  We talked about his opportunity to care for himself in spite of the difficult situation that he endured with the job transition.  He has no suicidal or homicidal intent.  He still okay for outpatient follow-up.  He has perspective on the whole process.  I want him to get back on his diet, check his sugar little more frequently and then let me know how he is doing.  He does have resting tachycardia noted at the office visit today.  He was still able to go exercise and walk 3 miles this morning.  It is unclear to me how much the stress of the visit affects his baseline tachycardia that we noted today.  If he has persistent tachycardia then I want him to take an extra dose of carvedilol and update me as needed.  His heart still sounds regular.  He agrees with plan.

## 2018-03-04 ENCOUNTER — Telehealth: Payer: Self-pay | Admitting: *Deleted

## 2018-03-04 NOTE — Telephone Encounter (Signed)
VM, no DPR, will try again later.

## 2018-03-04 NOTE — Telephone Encounter (Signed)
-----   Message from Graham S Duncan, MD sent at 03/04/2018  6:21 AM EST ----- Regarding: FW: Update from patient regarding sugar/diet Please get update on patient with sugar/diet.  Thanks.  Shaw  ----- Message ----- From: Duncan, Graham S, MD Sent: 03/04/2018 To: Graham S Duncan, MD Subject: Update from patient regarding sugar/diet        

## 2018-03-04 NOTE — Telephone Encounter (Signed)
-----   Message from Joaquim Nam, MD sent at 03/04/2018  6:21 AM EST ----- Regarding: FW: Update from patient regarding sugar/diet Please get update on patient with sugar/diet.  Thanks.  Clelia Croft  ----- Message ----- From: Joaquim Nam, MD Sent: 03/04/2018 To: Joaquim Nam, MD Subject: Update from patient regarding sugar/diet

## 2018-03-04 NOTE — Telephone Encounter (Signed)
Left detailed message on voicemail to return call with information. 

## 2018-05-11 ENCOUNTER — Encounter (HOSPITAL_COMMUNITY): Payer: Self-pay | Admitting: Emergency Medicine

## 2018-05-11 ENCOUNTER — Other Ambulatory Visit: Payer: Self-pay

## 2018-05-11 ENCOUNTER — Observation Stay (HOSPITAL_COMMUNITY)
Admission: EM | Admit: 2018-05-11 | Discharge: 2018-05-15 | Disposition: A | Discharge status: Home / Self Care | Payer: BC Managed Care – PPO | Attending: Internal Medicine | Admitting: Internal Medicine

## 2018-05-11 ENCOUNTER — Emergency Department (HOSPITAL_COMMUNITY): Payer: BC Managed Care – PPO

## 2018-05-11 DIAGNOSIS — G4733 Obstructive sleep apnea (adult) (pediatric): Secondary | ICD-10-CM | POA: Diagnosis not present

## 2018-05-11 DIAGNOSIS — E785 Hyperlipidemia, unspecified: Secondary | ICD-10-CM | POA: Insufficient documentation

## 2018-05-11 DIAGNOSIS — E1165 Type 2 diabetes mellitus with hyperglycemia: Secondary | ICD-10-CM | POA: Insufficient documentation

## 2018-05-11 DIAGNOSIS — I5022 Chronic systolic (congestive) heart failure: Secondary | ICD-10-CM | POA: Diagnosis present

## 2018-05-11 DIAGNOSIS — I429 Cardiomyopathy, unspecified: Secondary | ICD-10-CM | POA: Diagnosis not present

## 2018-05-11 DIAGNOSIS — Z833 Family history of diabetes mellitus: Secondary | ICD-10-CM | POA: Insufficient documentation

## 2018-05-11 DIAGNOSIS — I110 Hypertensive heart disease with heart failure: Secondary | ICD-10-CM | POA: Insufficient documentation

## 2018-05-11 DIAGNOSIS — J449 Chronic obstructive pulmonary disease, unspecified: Secondary | ICD-10-CM | POA: Diagnosis not present

## 2018-05-11 DIAGNOSIS — R000 Tachycardia, unspecified: Secondary | ICD-10-CM | POA: Diagnosis present

## 2018-05-11 DIAGNOSIS — Z1211 Encounter for screening for malignant neoplasm of colon: Secondary | ICD-10-CM | POA: Diagnosis present

## 2018-05-11 DIAGNOSIS — R0602 Shortness of breath: Secondary | ICD-10-CM | POA: Diagnosis present

## 2018-05-11 DIAGNOSIS — E871 Hypo-osmolality and hyponatremia: Secondary | ICD-10-CM

## 2018-05-11 DIAGNOSIS — Z6841 Body Mass Index (BMI) 40.0 and over, adult: Secondary | ICD-10-CM | POA: Diagnosis not present

## 2018-05-11 DIAGNOSIS — R0989 Other specified symptoms and signs involving the circulatory and respiratory systems: Secondary | ICD-10-CM | POA: Diagnosis not present

## 2018-05-11 DIAGNOSIS — R509 Fever, unspecified: Secondary | ICD-10-CM | POA: Diagnosis not present

## 2018-05-11 DIAGNOSIS — J09X2 Influenza due to identified novel influenza A virus with other respiratory manifestations: Secondary | ICD-10-CM | POA: Diagnosis present

## 2018-05-11 DIAGNOSIS — Z7984 Long term (current) use of oral hypoglycemic drugs: Secondary | ICD-10-CM | POA: Insufficient documentation

## 2018-05-11 DIAGNOSIS — IMO0002 Reserved for concepts with insufficient information to code with codable children: Secondary | ICD-10-CM

## 2018-05-11 DIAGNOSIS — J101 Influenza due to other identified influenza virus with other respiratory manifestations: Secondary | ICD-10-CM | POA: Diagnosis not present

## 2018-05-11 DIAGNOSIS — Z9119 Patient's noncompliance with other medical treatment and regimen: Secondary | ICD-10-CM | POA: Insufficient documentation

## 2018-05-11 DIAGNOSIS — Z9114 Patient's other noncompliance with medication regimen: Secondary | ICD-10-CM | POA: Diagnosis not present

## 2018-05-11 DIAGNOSIS — Z91199 Patient's noncompliance with other medical treatment and regimen due to unspecified reason: Secondary | ICD-10-CM

## 2018-05-11 DIAGNOSIS — R9431 Abnormal electrocardiogram [ECG] [EKG]: Secondary | ICD-10-CM | POA: Diagnosis not present

## 2018-05-11 DIAGNOSIS — Z79899 Other long term (current) drug therapy: Secondary | ICD-10-CM | POA: Diagnosis not present

## 2018-05-11 DIAGNOSIS — J9811 Atelectasis: Secondary | ICD-10-CM | POA: Diagnosis not present

## 2018-05-11 DIAGNOSIS — E118 Type 2 diabetes mellitus with unspecified complications: Secondary | ICD-10-CM | POA: Diagnosis present

## 2018-05-11 LAB — CBC WITH DIFFERENTIAL/PLATELET
Abs Immature Granulocytes: 0.03 10*3/uL (ref 0.00–0.07)
Basophils Absolute: 0.1 10*3/uL (ref 0.0–0.1)
Basophils Relative: 1 %
Eosinophils Absolute: 0.2 10*3/uL (ref 0.0–0.5)
Eosinophils Relative: 3 %
HCT: 44.8 % (ref 39.0–52.0)
Hemoglobin: 13.8 g/dL (ref 13.0–17.0)
Immature Granulocytes: 0 %
Lymphocytes Relative: 8 %
Lymphs Abs: 0.5 10*3/uL — ABNORMAL LOW (ref 0.7–4.0)
MCH: 27.9 pg (ref 26.0–34.0)
MCHC: 30.8 g/dL (ref 30.0–36.0)
MCV: 90.7 fL (ref 80.0–100.0)
Monocytes Absolute: 0.6 10*3/uL (ref 0.1–1.0)
Monocytes Relative: 8 %
Neutro Abs: 5.5 10*3/uL (ref 1.7–7.7)
Neutrophils Relative %: 80 %
Platelets: 192 10*3/uL (ref 150–400)
RBC: 4.94 MIL/uL (ref 4.22–5.81)
RDW: 12.9 % (ref 11.5–15.5)
WBC: 6.9 10*3/uL (ref 4.0–10.5)
nRBC: 0 % (ref 0.0–0.2)

## 2018-05-11 LAB — COMPREHENSIVE METABOLIC PANEL
ALT: 29 U/L (ref 0–44)
AST: 41 U/L (ref 15–41)
Albumin: 3.5 g/dL (ref 3.5–5.0)
Alkaline Phosphatase: 68 U/L (ref 38–126)
Anion gap: 18 — ABNORMAL HIGH (ref 5–15)
BUN: 20 mg/dL (ref 6–20)
CO2: 19 mmol/L — ABNORMAL LOW (ref 22–32)
Calcium: 9.2 mg/dL (ref 8.9–10.3)
Chloride: 93 mmol/L — ABNORMAL LOW (ref 98–111)
Creatinine, Ser: 1.1 mg/dL (ref 0.61–1.24)
GFR calc Af Amer: >60 mL/min (ref >60)
GFR calc non Af Amer: >60 mL/min (ref >60)
Glucose, Bld: 311 mg/dL — ABNORMAL HIGH (ref 70–99)
Potassium: 4.2 mmol/L (ref 3.5–5.1)
Sodium: 130 mmol/L — ABNORMAL LOW (ref 135–145)
Total Bilirubin: 0.5 mg/dL (ref 0.3–1.2)
Total Protein: 7.8 g/dL (ref 6.5–8.1)

## 2018-05-11 LAB — URINALYSIS, ROUTINE W REFLEX MICROSCOPIC
Bilirubin Urine: NEGATIVE
Glucose, UA: >=500 mg/dL — AB
Ketones, ur: NEGATIVE mg/dL
Leukocytes, UA: NEGATIVE
Nitrite: NEGATIVE
Protein, ur: 30 mg/dL — AB
Specific Gravity, Urine: 1.013 (ref 1.005–1.030)
pH: 5 (ref 5.0–8.0)

## 2018-05-11 LAB — INFLUENZA PANEL BY PCR (TYPE A & B)
Influenza A By PCR: POSITIVE — AB
Influenza B By PCR: NEGATIVE

## 2018-05-11 LAB — LACTIC ACID, PLASMA
Lactic Acid, Venous: 2 mmol/L (ref 0.5–1.9)
Lactic Acid, Venous: 1.7 mmol/L (ref 0.5–1.9)

## 2018-05-11 LAB — BRAIN NATRIURETIC PEPTIDE: B Natriuretic Peptide: 55.8 pg/mL (ref 0.0–100.0)

## 2018-05-11 LAB — TROPONIN I: Troponin I: 0.03 ng/mL (ref <0.03)

## 2018-05-11 MED ORDER — SODIUM CHLORIDE 0.9 % IV BOLUS
500.0000 mL | Freq: Once | INTRAVENOUS | Status: AC
  Administered 2018-05-11: 500 mL via INTRAVENOUS

## 2018-05-11 MED ORDER — SODIUM CHLORIDE 0.9 % IV BOLUS: 1000.0000 mL | Freq: Once | INTRAVENOUS | Status: DC

## 2018-05-11 MED ORDER — ACETAMINOPHEN 325 MG PO TABS
650.0000 mg | ORAL_TABLET | Freq: Once | ORAL | Status: AC | PRN
  Administered 2018-05-11: 650 mg via ORAL
  Filled 2018-05-11: qty 2

## 2018-05-11 MED ORDER — SODIUM CHLORIDE 0.9 % IV SOLN
500.0000 mg | Freq: Once | INTRAVENOUS | Status: AC
  Administered 2018-05-11: 500 mg via INTRAVENOUS
  Filled 2018-05-11: qty 500

## 2018-05-11 MED ORDER — SODIUM CHLORIDE 0.9 % IV SOLN
2.0000 g | Freq: Once | INTRAVENOUS | Status: AC
  Administered 2018-05-11: 2 g via INTRAVENOUS
  Filled 2018-05-11: qty 20

## 2018-05-11 NOTE — ED Provider Notes (Signed)
[facility] Emergency Department Provider Note MRN:  [id number]  Arrival date & time: 05/11/18     Chief Complaint   Cough   History of Present Illness   Austin Shaw is a 53 y.o. year-old male with a history of diabetes, hypertension presenting to the ED with chief complaint of cough.  1 day of persistent dry cough, associated with shortness of breath, worse with exertion, worse when laying flat.  Explains that he has a history of diabetes and heart failure and has not been taking any of his medications for several weeks.  Denies fever, no chest pain, no abdominal pain, no dysuria, no rash.  Symptoms are moderate, no exacerbating relieving factors.  Review of Systems  A complete 10 system review of systems was obtained and all systems are negative except as noted in the HPI and PMH.   Patient's Health History    Past Medical History:  Diagnosis Date  . Allergy   . Diabetes mellitus    type 2  . Hyperlipidemia   . Hypertension   . Morbid obesity (HCC)    BMI 50  . Sleep apnea     Past Surgical History:  Procedure Laterality Date  . TONSILLECTOMY      Family History  Problem Relation Age of Onset  . Diabetes Father   . Diabetes Unknown        fam hx 1st degree relative  . Congestive Heart Failure Unknown   . Colon cancer Neg Hx   . Prostate cancer Neg Hx   . Esophageal cancer Neg Hx   . Rectal cancer Neg Hx     Social History   Socioeconomic History  . Marital status: Married    Spouse name: Not on file  . Number of children: Not on file  . Years of education: Not on file  . Highest education level: Not on file  Occupational History  . Occupation: Media plannerAccount Manager    Employer: [employer]  Social Needs  . Financial resource strain: Not on file  . Food insecurity:    Worry: Not on file    Inability: Not on file  . Transportation needs:    Medical: Not on file    Non-medical: Not on file  Tobacco Use  . Smoking status: Never Smoker  .  Smokeless tobacco: Never Used  Substance and Sexual Activity  . Alcohol use: No    Alcohol/week: 0.0 standard drinks  . Drug use: No  . Sexual activity: Not on file  Lifestyle  . Physical activity:    Days per week: Not on file    Minutes per session: Not on file  . Stress: Not on file  Relationships  . Social connections:    Talks on phone: Not on file    Gets together: Not on file    Attends religious service: Not on file    Active member of club or organization: Not on file    Attends meetings of clubs or organizations: Not on file    Relationship status: Not on file  . Intimate partner violence:    Fear of current or ex partner: Not on file    Emotionally abused: Not on file    Physically abused: Not on file    Forced sexual activity: Not on file  Other Topics Concern  . Not on file  Social History Narrative   Market researcherells appliances at [employer] ([employer]   4 kids   Married, 1998   [place]  Physical Exam  Vital Signs and Nursing Notes reviewed Vitals:   05/11/18 2230 05/11/18 2250  BP: (!) 189/109 (!) 169/102  Pulse: (!) 116 (!) 118  Resp:  (!) 22  Temp:    SpO2: 95% 96%    CONSTITUTIONAL: Well-appearing, NAD NEURO:  Alert and oriented x 3, no focal deficits EYES:  eyes equal and reactive ENT/NECK:  no LAD, no JVD CARDIO: Tachycardic rate, well-perfused, normal S1 and S2 PULM:  CTAB no wheezing or rhonchi GI/GU:  normal bowel sounds, non-distended, non-tender MSK/SPINE:  No gross deformities, no edema SKIN:  no rash, atraumatic PSYCH:  Appropriate speech and behavior  Diagnostic and Interventional Summary    EKG Interpretation  Date/Time:  Saturday May 11 2018 18:55:26 EST Ventricular Rate:  143 PR Interval:    QRS Duration: 96 QT Interval:  309 QTC Calculation: 477 R Axis:   63 Text Interpretation:  Sinus tachycardia Minimal ST elevation, lateral leads Borderline prolonged QT interval Confirmed by Kennis Carina [phone]) on 05/11/2018 7:07:36  PM      Labs Reviewed  LACTIC ACID, PLASMA - Abnormal; Notable for the following components:      Result Value   Lactic Acid, Venous 2.0 (*)    All other components within normal limits  COMPREHENSIVE METABOLIC PANEL - Abnormal; Notable for the following components:   Sodium 130 (*)    Chloride 93 (*)    CO2 19 (*)    Glucose, Bld 311 (*)    Anion gap 18 (*)    All other components within normal limits  CBC WITH DIFFERENTIAL/PLATELET - Abnormal; Notable for the following components:   Lymphs Abs 0.5 (*)    All other components within normal limits  URINALYSIS, ROUTINE W REFLEX MICROSCOPIC - Abnormal; Notable for the following components:   Color, Urine STRAW (*)    Glucose, UA >=500 (*)    Hgb urine dipstick LARGE (*)    Protein, ur 30 (*)    Bacteria, UA RARE (*)    All other components within normal limits  TROPONIN I - Abnormal; Notable for the following components:   Troponin I 0.03 (*)    All other components within normal limits  CULTURE, BLOOD (ROUTINE X 2)  CULTURE, BLOOD (ROUTINE X 2)  LACTIC ACID, PLASMA  BRAIN NATRIURETIC PEPTIDE  INFLUENZA PANEL BY PCR (TYPE A & B)    DG Chest 2 View  Final Result      Medications  acetaminophen (TYLENOL) tablet 650 mg (650 mg Oral Given 05/11/18 1940)  sodium chloride 0.9 % bolus 500 mL (0 mLs Intravenous Stopped 05/11/18 2043)  cefTRIAXone (ROCEPHIN) 2 g in sodium chloride 0.9 % 100 mL IVPB (0 g Intravenous Stopped 05/11/18 2043)  azithromycin (ZITHROMAX) 500 mg in sodium chloride 0.9 % 250 mL IVPB (0 mg Intravenous Stopped 05/11/18 2053)  sodium chloride 0.9 % bolus 500 mL (0 mLs Intravenous Stopped 05/11/18 2239)     Procedures Critical Care Critical Care Documentation Critical care time provided by me (excluding procedures): 32 minutes  Condition necessitating critical care: Concern for symptoms  Components of critical care management: reviewing of prior records, laboratory and imaging interpretation, frequent  re-examination and reassessment of vital signs, administration of IV fluids, IV antibiotics, discussion with consulting services.    ED Course and Medical Decision Making  I have reviewed the triage vital signs and the nursing notes.  Pertinent labs & imaging results that were available during my care of the patient were reviewed by me and  considered in my medical decision making (see below for details).  Concern for sepsis in this 53 year old male noncompliant diabetic here with fever, heart rate 140s, also with question of orthopnea, persistent cough.  Code sepsis initiated, IV ceftriaxone and azithromycin, work-up pending.  Judicious fluids given the possibility of concomitant CHF exacerbation.  No clear pneumonia, urinalysis unremarkable, influenza panel pending.  Admitted to hospital service for further care.  Heart rate improved to 100-110 after IV fluids.  Elmer Sow. Pilar Plate, MD [facility] Emergency Medicine [facility] [email or web address]  Final Clinical Impressions(s) / ED Diagnoses     ICD-10-CM   1. Fever, unspecified fever cause R50.9   2. Special screening for malignant neoplasms, colon Z12.11   3. Tachycardia R00.0   4. Hyponatremia E87.1     ED Discharge Orders    None         Sabas Sous, MD 05/11/18 2328

## 2018-05-11 NOTE — H&P (Signed)
History and Physical   Clint Biello QHU:[id number] DOB: 1965-05-06 DOA: 05/11/2018  Referring MD/NP/PA: Dr. Sedonia Small  PCP: Tonia Ghent, MD   Outpatient Specialists: None  Patient coming from: Home  Chief Complaint: Fever and chills with cough  HPI: Austin Shaw is a 53 y.o. male with medical history significant of diabetes, hypertension, morbid obesity, hyperlipidemia, medication noncompliance and obstructive sleep apnea was brought in for 1 day history of persistent cough fever and some chills.  Patient has not been compliant with his medications for several weeks.  He has no sick contacts.  Denied any nausea vomiting or GI symptoms.  He was seen in the ER with elevated lactic acid 2.0 as well as temperature of almost 102.  His initial evaluation including chest x-ray and urinalysis were negative.  Viral assays are currently pending.  Due to patient's relatively immunocompromised status with diabetes and poor compliance is being admitted to the medical service for full evaluation and treatment..  ED Course: Temperature is 101.4 blood pressure 193/117 pulse 143 respiratory rate of 94 oxygen sats 94% on room air.  Sodium is 130 potassium is 4.2.  Creatinine is 1.10 with a gap of 18 BUN of 20 blood glucose 311.  The rest of the chemistry appears normal.  BNP is 55.  Initial troponin 0 0.03.  CBC entirely within normal and lactic acid of 2.0.  Urinalysis showed no significant evidence of UTI.  Chest x-ray showed cardiac enlargement with pulmonary vascular congestion and interstitial edema.  Patient is currently awaiting influenza screen.  He is being admitted to the hospital for treatment.  Review of Systems: As per HPI otherwise 10 point review of systems negative.    Past Medical History:  Diagnosis Date  . Allergy   . Diabetes mellitus    type 2  . Hyperlipidemia   . Hypertension   . Morbid obesity (HCC)    BMI 50  . Sleep apnea     Past Surgical History:  Procedure  Laterality Date  . TONSILLECTOMY       reports that he has never smoked. He has never used smokeless tobacco. He reports that he does not drink alcohol or use drugs.  No Known Allergies  Family History  Problem Relation Age of Onset  . Diabetes Father   . Diabetes Unknown        fam hx 1st degree relative  . Congestive Heart Failure Unknown   . Colon cancer Neg Hx   . Prostate cancer Neg Hx   . Esophageal cancer Neg Hx   . Rectal cancer Neg Hx      Prior to Admission medications   Medication Sig Start Date End Date Taking? Authorizing Provider  acetaminophen (TYLENOL) 500 MG tablet Take 1,000 mg by mouth every 6 (six) hours as needed for mild pain or fever.   Yes [provider]  Blood Glucose Monitoring Suppl (ONE TOUCH ULTRA SYSTEM KIT) w/Device KIT Check blood sugar twice a day and as directed. Dx E11.8 08/01/16  Yes Tonia Ghent, MD  carvedilol (COREG) 3.125 MG tablet Take 1 tablet (3.125 mg total) by mouth 2 (two) times daily with a meal. Patient taking differently: Take 3.125 mg by mouth 2 (two) times a week.  07/02/17  Yes Tonia Ghent, MD  furosemide (LASIX) 40 MG tablet Take 0.5 tablets (20 mg total) by mouth daily as needed for fluid or edema. Patient taking differently: Take 20 mg by mouth 2 (two) times a week.  04/12/17  Yes Tonia Ghent, MD  glucose blood (ONE TOUCH ULTRA TEST) test strip Check blood sugar twice a day and as directed. Dx E11.8 08/01/16  Yes Tonia Ghent, MD  losartan (COZAAR) 25 MG tablet Take 1 tablet (25 mg total) by mouth daily. Patient taking differently: Take 25 mg by mouth 2 (two) times a week.  04/20/17  Yes Tonia Ghent, MD  metFORMIN (GLUCOPHAGE-XR) 500 MG 24 hr tablet Take 3 tablets (1,500 mg total) by mouth daily with breakfast. Patient taking differently: Take 1,000-1,500 mg by mouth 2 (two) times a week.  08/13/17  Yes Tonia Ghent, MD    Physical Exam: Vitals:   05/11/18 2200 05/11/18 2215 05/11/18 2230  05/11/18 2250  BP: (!) 179/108 (!) 193/117 (!) 189/109 (!) 169/102  Pulse: (!) 111 (!) 117 (!) 116 (!) 118  Resp: (!) 22 17  (!) 22  Temp:      TempSrc:      SpO2: 100% 97% 95% 96%  Weight:      Height:          Constitutional: NAD, calm, comfortable, morbidly obese Vitals:   05/11/18 2200 05/11/18 2215 05/11/18 2230 05/11/18 2250  BP: (!) 179/108 (!) 193/117 (!) 189/109 (!) 169/102  Pulse: (!) 111 (!) 117 (!) 116 (!) 118  Resp: (!) 22 17  (!) 22  Temp:      TempSrc:      SpO2: 100% 97% 95% 96%  Weight:      Height:       Eyes: PERRL, lids and conjunctivae normal ENMT: Mucous membranes are moist. Posterior pharynx clear of any exudate or lesions.Normal dentition.  Neck: normal, supple, no masses, no thyromegaly Respiratory: clear to auscultation bilaterally, no wheezing, no crackles. Normal respiratory effort. No accessory muscle use.  Cardiovascular: Regular rate and rhythm, no murmurs / rubs / gallops. No extremity edema. 2+ pedal pulses. No carotid bruits.  Abdomen: no tenderness, no masses palpated. No hepatosplenomegaly. Bowel sounds positive.  Musculoskeletal: no clubbing / cyanosis. No joint deformity upper and lower extremities. Good ROM, no contractures. Normal muscle tone.  Skin: no rashes, lesions, ulcers. No induration Neurologic: CN 2-12 grossly intact. Sensation intact, DTR normal. Strength 5/5 in all 4.  Psychiatric: Normal judgment and insight. Alert and oriented x 3. Normal mood.     Labs on Admission: I have personally reviewed following labs and imaging studies  CBC: Recent Labs  Lab 05/11/18 1844  WBC 6.9  NEUTROABS 5.5  HGB 13.8  HCT 44.8  MCV 90.7  PLT 732   Basic Metabolic Panel: Recent Labs  Lab 05/11/18 1844  NA 130*  K 4.2  CL 93*  CO2 19*  GLUCOSE 311*  BUN 20  CREATININE 1.10  CALCIUM 9.2   GFR: Estimated Creatinine Clearance: 120 mL/min (by C-G formula based on SCr of 1.1 mg/dL). Liver Function Tests: Recent Labs  Lab  05/11/18 1844  AST 41  ALT 29  ALKPHOS 68  BILITOT 0.5  PROT 7.8  ALBUMIN 3.5   No results for input(s): LIPASE, AMYLASE in the last 168 hours. No results for input(s): AMMONIA in the last 168 hours. Coagulation Profile: No results for input(s): INR, PROTIME in the last 168 hours. Cardiac Enzymes: Recent Labs  Lab 05/11/18 1844  TROPONINI 0.03*   BNP (last 3 results) No results for input(s): PROBNP in the last 8760 hours. HbA1C: No results for input(s): HGBA1C in the last 72 hours. CBG: No results for input(s): GLUCAP in  the last 168 hours. Lipid Profile: No results for input(s): CHOL, HDL, LDLCALC, TRIG, CHOLHDL, LDLDIRECT in the last 72 hours. Thyroid Function Tests: No results for input(s): TSH, T4TOTAL, FREET4, T3FREE, THYROIDAB in the last 72 hours. Anemia Panel: No results for input(s): VITAMINB12, FOLATE, FERRITIN, TIBC, IRON, RETICCTPCT in the last 72 hours. Urine analysis:    Component Value Date/Time   COLORURINE STRAW (A) 05/11/2018 1844   APPEARANCEUR CLEAR 05/11/2018 1844   LABSPEC 1.013 05/11/2018 1844   PHURINE 5.0 05/11/2018 1844   GLUCOSEU >=500 (A) 05/11/2018 1844   HGBUR LARGE (A) 05/11/2018 1844   BILIRUBINUR NEGATIVE 05/11/2018 1844   [place] 05/11/2018 1844   PROTEINUR 30 (A) 05/11/2018 1844   NITRITE NEGATIVE 05/11/2018 1844   LEUKOCYTESUR NEGATIVE 05/11/2018 1844   Sepsis Labs: @LABRCNTIP (procalcitonin:4,lacticidven:4) )No results found for this or any previous visit (from the past 240 hour(s)).   Radiological Exams on Admission: Dg Chest 2 View  Result Date: 05/11/2018 CLINICAL DATA:  Shortness of breath. Cough for a few days. History of COPD and diabetes. EXAM: CHEST - 2 VIEW COMPARISON:  08/05/2014 FINDINGS: Chronic elevation of the right hemidiaphragm, unchanged since prior study. Cardiac enlargement. New pulmonary vascular congestion with perihilar interstitial changes likely representing interstitial edema. No focal  consolidation. No blunting of costophrenic angles. No pneumothorax. Mediastinal contours appear intact. Calcification of the aorta. Degenerative changes in the spine. IMPRESSION: Cardiac enlargement with pulmonary vascular congestion and interstitial edema. Electronically Signed   By: Lucienne Capers M.D.   On: 05/11/2018 20:45    EKG: Independently reviewed.  It shows sinus tachycardia with a rate of 143.  Nonspecific ST changes in the lateral leads.  Assessment/Plan Principal Problem:   Fever and chills Active Problems:   Diabetes mellitus type 2, uncontrolled, with complications (HCC)   OBESITY, MORBID   Cough   SOB (shortness of breath)     #1 acute febrile illness: Suspected viral syndrome.  Chest x-ray and urinalysis negative.  No other source of infection found.  The sudden nature and the myalgias associated with his symptoms makes influenza and other viral syndromes possible.  We will admit the patient and initiate supportive care for observation.  Obtain nasal swab for  influenza A&B.  Monitor closely.  Empirically on Rocephin and Zithromax for possible early pneumonia not yet manifesting on the x-ray.  #2 diabetes: Continue home regimen with sliding scale insulin.  Patient has been noncompliant.  Check hemoglobin A1c.  #3 morbid obesity: Counseling provided.  Dietary counseling on a continuous basis.  #4 hypertension: Continue home regimen.  #5 hyperlipidemia: Continue with statin.   DVT prophylaxis: Lovenox Code Status: Full code Family Communication: Wife and daughter in the room Disposition Plan: Home Consults called: None Admission status: Observation  Severity of Illness: The appropriate patient status for this patient is OBSERVATION. Observation status is judged to be reasonable and necessary in order to provide the required intensity of service to ensure the patient's safety. The patient's presenting symptoms, physical exam findings, and initial radiographic and  laboratory data in the context of their medical condition is felt to place them at decreased risk for further clinical deterioration. Furthermore, it is anticipated that the patient will be medically stable for discharge from the hospital within 2 midnights of admission. The following factors support the patient status of observation.   " The patient's presenting symptoms include fever and cough. " The physical exam findings include mild crackles. " The initial radiographic and laboratory data are no evidence  of pneumonia.     Barbette Merino MD [facility] Pager [phone]  If 7PM-7AM, please contact night-coverage [email or web address] Password [email or web address]  05/11/2018, 11:08 PM

## 2018-05-11 NOTE — ED Triage Notes (Signed)
Patient from home, c/o no productive cough that started yesterday. Patient has hx of CHF and reports non compliance with his CHF medications or diabetic medications.

## 2018-05-11 NOTE — ED Notes (Signed)
Attempted report x 2 

## 2018-05-12 DIAGNOSIS — E118 Type 2 diabetes mellitus with unspecified complications: Secondary | ICD-10-CM | POA: Diagnosis not present

## 2018-05-12 DIAGNOSIS — E1165 Type 2 diabetes mellitus with hyperglycemia: Secondary | ICD-10-CM

## 2018-05-12 DIAGNOSIS — J09X2 Influenza due to identified novel influenza A virus with other respiratory manifestations: Secondary | ICD-10-CM | POA: Diagnosis present

## 2018-05-12 DIAGNOSIS — I5022 Chronic systolic (congestive) heart failure: Secondary | ICD-10-CM | POA: Diagnosis present

## 2018-05-12 DIAGNOSIS — E871 Hypo-osmolality and hyponatremia: Secondary | ICD-10-CM

## 2018-05-12 DIAGNOSIS — Z91199 Patient's noncompliance with other medical treatment and regimen due to unspecified reason: Secondary | ICD-10-CM

## 2018-05-12 DIAGNOSIS — R509 Fever, unspecified: Secondary | ICD-10-CM | POA: Diagnosis not present

## 2018-05-12 DIAGNOSIS — Z9119 Patient's noncompliance with other medical treatment and regimen: Secondary | ICD-10-CM

## 2018-05-12 LAB — COMPREHENSIVE METABOLIC PANEL
ALT: 31 U/L (ref 0–44)
AST: 50 U/L — ABNORMAL HIGH (ref 15–41)
Albumin: 3.2 g/dL — ABNORMAL LOW (ref 3.5–5.0)
Alkaline Phosphatase: 63 U/L (ref 38–126)
Anion gap: 14 (ref 5–15)
BUN: 17 mg/dL (ref 6–20)
CO2: 22 mmol/L (ref 22–32)
Calcium: 8.8 mg/dL — ABNORMAL LOW (ref 8.9–10.3)
Chloride: 97 mmol/L — ABNORMAL LOW (ref 98–111)
Creatinine, Ser: 1.1 mg/dL (ref 0.61–1.24)
GFR calc Af Amer: >60 mL/min (ref >60)
GFR calc non Af Amer: >60 mL/min (ref >60)
Glucose, Bld: 327 mg/dL — ABNORMAL HIGH (ref 70–99)
Potassium: 4.1 mmol/L (ref 3.5–5.1)
Sodium: 133 mmol/L — ABNORMAL LOW (ref 135–145)
Total Bilirubin: 0.7 mg/dL (ref 0.3–1.2)
Total Protein: 7.4 g/dL (ref 6.5–8.1)

## 2018-05-12 LAB — GLUCOSE, CAPILLARY
Glucose-Capillary: 325 mg/dL — ABNORMAL HIGH (ref 70–99)
Glucose-Capillary: 323 mg/dL — ABNORMAL HIGH (ref 70–99)
Glucose-Capillary: 207 mg/dL — ABNORMAL HIGH (ref 70–99)
Glucose-Capillary: 190 mg/dL — ABNORMAL HIGH (ref 70–99)

## 2018-05-12 LAB — CBC
HCT: 40.7 % (ref 39.0–52.0)
Hemoglobin: 12.8 g/dL — ABNORMAL LOW (ref 13.0–17.0)
MCH: 28 pg (ref 26.0–34.0)
MCHC: 31.4 g/dL (ref 30.0–36.0)
MCV: 89.1 fL (ref 80.0–100.0)
Platelets: 173 10*3/uL (ref 150–400)
RBC: 4.57 MIL/uL (ref 4.22–5.81)
RDW: 13.1 % (ref 11.5–15.5)
WBC: 7.5 10*3/uL (ref 4.0–10.5)
nRBC: 0 % (ref 0.0–0.2)

## 2018-05-12 LAB — HEMOGLOBIN A1C
Hgb A1c MFr Bld: 13.4 % — ABNORMAL HIGH (ref 4.8–5.6)
Mean Plasma Glucose: 337.88 mg/dL

## 2018-05-12 LAB — MRSA PCR SCREENING: MRSA by PCR: NEGATIVE

## 2018-05-12 LAB — HIV ANTIBODY (ROUTINE TESTING W REFLEX): HIV Screen 4th Generation wRfx: NONREACTIVE

## 2018-05-12 MED ORDER — ONDANSETRON HCL 4 MG PO TABS: 4.0000 mg | ORAL_TABLET | Freq: Four times a day (QID) | ORAL | Status: DC | PRN

## 2018-05-12 MED ORDER — OSELTAMIVIR PHOSPHATE 75 MG PO CAPS
75.0000 mg | ORAL_CAPSULE | Freq: Two times a day (BID) | ORAL | Status: DC
  Administered 2018-05-12 – 2018-05-15 (×8): 75 mg via ORAL
  Filled 2018-05-12 (×8): qty 1

## 2018-05-12 MED ORDER — ONDANSETRON HCL 4 MG/2ML IJ SOLN: 4.0000 mg | Freq: Four times a day (QID) | INTRAMUSCULAR | Status: DC | PRN

## 2018-05-12 MED ORDER — SODIUM CHLORIDE 0.9 % IV SOLN
INTRAVENOUS | Status: DC
  Administered 2018-05-12: 10:00:00 via INTRAVENOUS

## 2018-05-12 MED ORDER — SODIUM CHLORIDE 0.9 % IV SOLN: INTRAVENOUS | Status: DC

## 2018-05-12 MED ORDER — LOSARTAN POTASSIUM 25 MG PO TABS: 25.0000 mg | ORAL_TABLET | ORAL | Status: DC

## 2018-05-12 MED ORDER — INSULIN ASPART 100 UNIT/ML ~~LOC~~ SOLN
0.0000 [IU] | Freq: Three times a day (TID) | SUBCUTANEOUS | Status: DC
  Administered 2018-05-12 (×2): 11 [IU] via SUBCUTANEOUS
  Administered 2018-05-12: 5 [IU] via SUBCUTANEOUS
  Administered 2018-05-13: 3 [IU] via SUBCUTANEOUS
  Administered 2018-05-13 (×2): 5 [IU] via SUBCUTANEOUS
  Administered 2018-05-14 – 2018-05-15 (×4): 3 [IU] via SUBCUTANEOUS

## 2018-05-12 MED ORDER — GUAIFENESIN-DM 100-10 MG/5ML PO SYRP
15.0000 mL | ORAL_SOLUTION | ORAL | Status: DC | PRN
  Administered 2018-05-12 (×2): 15 mL via ORAL
  Filled 2018-05-12 (×3): qty 15

## 2018-05-12 MED ORDER — HYDRALAZINE HCL 20 MG/ML IJ SOLN: 5.0000 mg | INTRAMUSCULAR | Status: DC | PRN

## 2018-05-12 MED ORDER — METFORMIN HCL ER 500 MG PO TB24: 1500.0000 mg | ORAL_TABLET | ORAL | Status: DC

## 2018-05-12 MED ORDER — INSULIN ASPART 100 UNIT/ML ~~LOC~~ SOLN
0.0000 [IU] | Freq: Every day | SUBCUTANEOUS | Status: DC
  Administered 2018-05-12: 3 [IU] via SUBCUTANEOUS

## 2018-05-12 MED ORDER — IBUPROFEN 200 MG PO TABS
400.0000 mg | ORAL_TABLET | Freq: Four times a day (QID) | ORAL | Status: DC | PRN
  Administered 2018-05-12 (×2): 400 mg via ORAL
  Filled 2018-05-12 (×2): qty 2

## 2018-05-12 MED ORDER — CARVEDILOL 3.125 MG PO TABS: 3.1250 mg | ORAL_TABLET | ORAL | Status: DC

## 2018-05-12 MED ORDER — AZITHROMYCIN 500 MG PO TABS
500.0000 mg | ORAL_TABLET | Freq: Every day | ORAL | Status: DC
  Administered 2018-05-12: 500 mg via ORAL
  Filled 2018-05-12: qty 1

## 2018-05-12 MED ORDER — INSULIN ASPART 100 UNIT/ML ~~LOC~~ SOLN: 3.0000 [IU] | Freq: Three times a day (TID) | SUBCUTANEOUS | Status: DC

## 2018-05-12 MED ORDER — INSULIN GLARGINE 100 UNIT/ML ~~LOC~~ SOLN
10.0000 [IU] | Freq: Every day | SUBCUTANEOUS | Status: DC
  Administered 2018-05-12: 10 [IU] via SUBCUTANEOUS
  Filled 2018-05-12: qty 0.1

## 2018-05-12 MED ORDER — CARVEDILOL 3.125 MG PO TABS
3.1250 mg | ORAL_TABLET | ORAL | Status: DC
  Administered 2018-05-12: 3.125 mg via ORAL
  Filled 2018-05-12: qty 1

## 2018-05-12 MED ORDER — ENOXAPARIN SODIUM 80 MG/0.8ML ~~LOC~~ SOLN
80.0000 mg | Freq: Every day | SUBCUTANEOUS | Status: DC
  Administered 2018-05-12 – 2018-05-14 (×3): 80 mg via SUBCUTANEOUS
  Filled 2018-05-12 (×4): qty 0.8

## 2018-05-12 MED ORDER — SODIUM CHLORIDE 0.9 % IV SOLN
INTRAVENOUS | Status: DC
  Administered 2018-05-12: 02:00:00 via INTRAVENOUS

## 2018-05-12 MED ORDER — ACETAMINOPHEN 500 MG PO TABS
1000.0000 mg | ORAL_TABLET | Freq: Four times a day (QID) | ORAL | Status: DC | PRN
  Administered 2018-05-12 – 2018-05-14 (×5): 1000 mg via ORAL
  Filled 2018-05-12 (×6): qty 2

## 2018-05-12 MED ORDER — FUROSEMIDE 20 MG PO TABS: 20.0000 mg | ORAL_TABLET | ORAL | Status: DC

## 2018-05-12 MED ORDER — SODIUM CHLORIDE 0.9 % IV SOLN: 1.0000 g | INTRAVENOUS | Status: DC

## 2018-05-12 NOTE — Progress Notes (Addendum)
Paged P.A. on call made aware patient is positive for Flu. I will continue droplet precautions and wait for new orders to follow.

## 2018-05-12 NOTE — Progress Notes (Signed)
Inpatient Diabetes Program Recommendations  AACE/ADA: New Consensus Statement on Inpatient Glycemic Control (2015)  Target Ranges:  Prepandial:   less than 140 mg/dL      Peak postprandial:   less than 180 mg/dL (1-2 hours)      Critically ill patients:  140 - 180 mg/dL   Lab Results  Component Value Date   GLUCAP 207 (H) 05/12/2018   HGBA1C 13.4 (H) 05/12/2018    Review of Glycemic Control  Diabetes history: DM2 Outpatient Diabetes medications: metformin 1500 mg QAM Current orders for Inpatient glycemic control: Lantus 10 units QHS, Novolog 0-15 units tidwc and hs  HgbA1C - 13.4% - uncontrolled DM  Received Novolog 11 units 3 hours after blood sugar was checked.  Inpatient Diabetes Program Recommendations:     Increase Lantus to 25 units QHS Add Novolog 6 units tidwc for meal coverage insulin  Will see pt on 2/3 regarding HgbA1C of 13.4%.  Thank you. Ailene Ards, RD, LDN, CDE Inpatient Diabetes Coordinator [phone]

## 2018-05-12 NOTE — Progress Notes (Signed)
    1033 hrs Pt is alert and oriented ,started having an elevated temperature this morning  T103, cold therapy commenced cold packs placed under arms and forehead. Tylenol administered.close monitoring commenced. 1139 Temp decreased to 100.1 after therapy. 1434 temp became elevated, pt didn't want cold therapy earlier. Commenced motrin as ordered. Care continues. Pt refuse to rest in bed states he starts coughing after being inbed , even while at home.close monitoring continues.

## 2018-05-12 NOTE — Progress Notes (Addendum)
[facility] PROGRESS NOTE  Austin Shaw LAG:[id number] DOB: [birth year] DOA: 05/11/2018 PCP: Joaquim Nam, MD  Assessment/Plan:  #1 acute febrile illness/Influenza A:  viral panel + influenza A.  Chest x-ray and urinalysis negative.  No other source of infection found.  Empirically started on Rocephin and Zithromax for possible early pneumonia not yet manifesting on the x-ray will discontinue this after this mornings dose. Continues with fever, tachycardia. oxygen saturation level greater 90% on room air. -gentle IV fluid -continue tamiflu day #2 -supportive therapy  #2 diabetes: home regimen is metformin.  Patient has been noncompliant. A1c 11/19 11.8. currently cbg uncontrolled - Check hemoglobin A1c. -hold metformin -continue sliding scale change to resistant and add meal coverage  #3 morbid obesity: Counseling provided.  Dietary counseling on a continuous basis. Estimated body mass index is 52.61 kg/m as calculated from the following:   Height as of this encounter: 5\' 9"  (1.753 m).   Weight as of this encounter: 161.6 kg.   #4 hypertension:  only fair control. Patient denies HTN diagnosis and does not believe he is on antihypertensive medications. Home meds include lasix, coreg, losartan. Hx cardiomyopathy as well.  -will resume losartan -will resume BB with parameters -monitor BP closely  #5 hyperlipidemia: Continue with statin  #6. Cardiomyopathy/chronic systolic heart failure. Most recent echo 2016 EF 50% grade 1 DD. This is improvement from EF 20% in past. Hx non-compliance, weight gain. -resume home meds as indicated above -monitor intake and output -will slow IV fluids down and stop in 12 hours -daily weights  #7. Non-compliance. Chart review indicates patient consistently non-compliant with management of DM and occasionally non-compliant with other medication, diet, exercise. 2017 he stopped taking all medications -monitor  Code Status: full Family  Communication: wife at bedside Disposition Plan: home hopefully tomorrow   Consultants:  none  Procedures:  none  Antibiotics:  Rocephin 05/11/18-05/12/18  Azithromycin 05/11/18-05/12/18  HPI/Subjective: Sitting in chair eating breakfast. Reports "rough night" but feeling better   53 yo admitted with fever/cough/general malaise. Positive influenza A.   Objective: Vitals:   05/12/18 0600 05/12/18 0842  BP: 116/77 (!) 167/88  Pulse: (!) 112   Resp: (!) 28   Temp:  (!) 101.5 F (38.6 C)  SpO2: 93% 95%    Intake/Output Summary (Last 24 hours) at 05/12/2018 1009 Last data filed at 05/12/2018 0900 Gross per 24 hour  Intake 3056.77 ml  Output 300 ml  Net 2756.77 ml   Filed Weights   05/11/18 1853 05/12/18 0016  Weight: (!) 161 kg (!) 161.6 kg    Exam:   General:  Obese sitting in chair in no acute distress  Cardiovascular: rrr HS distant trace LE edema  Respiratory: normal effort BS coarse and distant no wheeze  Abdomen: obese soft +BS no guarding or rebounding  Musculoskeletal: joints without swelling/erythema   Data Reviewed: Basic Metabolic Panel: Recent Labs  Lab 05/11/18 1844 05/12/18 0236  NA 130* 133*  K 4.2 4.1  CL 93* 97*  CO2 19* 22  GLUCOSE 311* 327*  BUN 20 17  CREATININE 1.10 1.10  CALCIUM 9.2 8.8*   Liver Function Tests: Recent Labs  Lab 05/11/18 1844 05/12/18 0236  AST 41 50*  ALT 29 31  ALKPHOS 68 63  BILITOT 0.5 0.7  PROT 7.8 7.4  ALBUMIN 3.5 3.2*   No results for input(s): LIPASE, AMYLASE in the last 168 hours. No results for input(s): AMMONIA in the last 168 hours. CBC: Recent Labs  Lab 05/11/18 1844  05/12/18 0236  WBC 6.9 7.5  NEUTROABS 5.5  --   HGB 13.8 12.8*  HCT 44.8 40.7  MCV 90.7 89.1  PLT 192 173   Cardiac Enzymes: Recent Labs  Lab 05/11/18 1844  TROPONINI 0.03*   BNP (last 3 results) Recent Labs    05/11/18 1844  BNP 55.8    ProBNP (last 3 results) No results for input(s): PROBNP in the last  8760 hours.  CBG: Recent Labs  Lab 05/12/18 0744  GLUCAP 325*    Recent Results (from the past 240 hour(s))  Blood Culture (routine x 2)     Status: None (Preliminary result)   Collection Time: 05/11/18  7:18 PM  Result Value Ref Range Status   Specimen Description BLOOD RIGHT ANTECUBITAL  Final   Special Requests   Final    BOTTLES DRAWN AEROBIC AND ANAEROBIC Blood Culture adequate volume   Culture NO GROWTH < 12 HOURS  Final   Report Status PENDING  Incomplete  Blood Culture (routine x 2)     Status: None (Preliminary result)   Collection Time: 05/11/18  7:50 PM  Result Value Ref Range Status   Specimen Description BLOOD LEFT ANTECUBITAL  Final   Special Requests   Final    BOTTLES DRAWN AEROBIC AND ANAEROBIC Blood Culture adequate volume   Culture NO GROWTH < 12 HOURS  Final   Report Status PENDING  Incomplete  MRSA PCR Screening     Status: None   Collection Time: 05/12/18 12:18 AM  Result Value Ref Range Status   MRSA by PCR NEGATIVE NEGATIVE Final    Comment:        The GeneXpert MRSA Assay (FDA approved for NASAL specimens only), is one component of a comprehensive MRSA colonization surveillance program. It is not intended to diagnose MRSA infection nor to guide or monitor treatment for MRSA infections. Performed at [facility], [address], [place], [place] [address]      Studies: Dg Chest 2 View  Result Date: 05/11/2018 CLINICAL DATA:  Shortness of breath. Cough for a few days. History of COPD and diabetes. EXAM: CHEST - 2 VIEW COMPARISON:  08/05/2014 FINDINGS: Chronic elevation of the right hemidiaphragm, unchanged since prior study. Cardiac enlargement. New pulmonary vascular congestion with perihilar interstitial changes likely representing interstitial edema. No focal consolidation. No blunting of costophrenic angles. No pneumothorax. Mediastinal contours appear intact. Calcification of the aorta. Degenerative changes in the spine. IMPRESSION:  Cardiac enlargement with pulmonary vascular congestion and interstitial edema. Electronically Signed   By: Burman Nieves M.D.   On: 05/11/2018 20:45    Scheduled Meds: . carvedilol  3.125 mg Oral Once per day on Wed Sat  . enoxaparin (LOVENOX) injection  80 mg Subcutaneous Daily  . [START ON 05/15/2018] furosemide  20 mg Oral Once per day on Wed Sat  . insulin aspart  0-15 Units Subcutaneous TID WC  . insulin aspart  0-5 Units Subcutaneous QHS  . insulin aspart  3 Units Subcutaneous TID WC  . losartan  25 mg Oral Once per day on Wed Sat  . oseltamivir  75 mg Oral BID   Continuous Infusions: . sodium chloride      Principal Problem:   Influenza due to identified novel influenza A virus with other respiratory manifestations Active Problems:   Cardiomyopathy (HCC)   Acute febrile illness   Non-compliance   Diabetes mellitus type 2, uncontrolled, with complications (HCC)   OBESITY, MORBID   SOB (shortness of breath)  Chronic systolic heart failure (HCC)    Time spent: 45 minutes    [facility] M NP  [facility]  If 7PM-7AM, please contact night-coverage at [email or web address], password [email or web address] 05/12/2018, 10:09 AM  LOS: 0 days      Patient was seen, examined,treatment plan was discussed with the Advance Practice Provider.  I have personally reviewed the clinical findings, labs, EKG, imaging studies and management of this patient in detail. I have also reviewed the orders written for this patient which were under my direction. I agree with the documentation, as recorded by the Advance Practice Provider.   Austin Shaw is a 53 y.o. male here with poorly controlled DM and influenza.  Still spiking temperature. BP lower end of normal but not sure how accurate.  Gentle IVF.  Alternate tylenol and motrin.  Continue to monitor off abx-- not convinced he has a pneumonia.   Joseph Art, DO  How to contact the [facility] Attending or Consulting provider 7A - 7P or covering provider  during after hours 7P -7A, for this patient?  1. Check the care team in [facility] and look for a) attending/consulting TRH provider listed and b) the [facility] team listed 2. Log into [email or web address] and use [place]'s universal password to access. If you do not have the password, please contact the hospital operator. 3. Locate the [facility] provider you are looking for under [facility] and page to a number that you can be directly reached. 4. If you still have difficulty reaching the provider, please page the [facility] (Director on Call) for the Hospitalists listed on amion for assistance.

## 2018-05-13 ENCOUNTER — Observation Stay (HOSPITAL_COMMUNITY): Payer: BC Managed Care – PPO

## 2018-05-13 DIAGNOSIS — I5022 Chronic systolic (congestive) heart failure: Secondary | ICD-10-CM | POA: Diagnosis not present

## 2018-05-13 DIAGNOSIS — E118 Type 2 diabetes mellitus with unspecified complications: Secondary | ICD-10-CM | POA: Diagnosis not present

## 2018-05-13 DIAGNOSIS — J09X2 Influenza due to identified novel influenza A virus with other respiratory manifestations: Secondary | ICD-10-CM | POA: Diagnosis not present

## 2018-05-13 DIAGNOSIS — R509 Fever, unspecified: Secondary | ICD-10-CM | POA: Diagnosis not present

## 2018-05-13 LAB — BASIC METABOLIC PANEL
Anion gap: 10 (ref 5–15)
BUN: 24 mg/dL — ABNORMAL HIGH (ref 6–20)
CO2: 22 mmol/L (ref 22–32)
Calcium: 8 mg/dL — ABNORMAL LOW (ref 8.9–10.3)
Chloride: 99 mmol/L (ref 98–111)
Creatinine, Ser: 1.23 mg/dL (ref 0.61–1.24)
GFR calc Af Amer: >60 mL/min (ref >60)
GFR calc non Af Amer: >60 mL/min (ref >60)
Glucose, Bld: 277 mg/dL — ABNORMAL HIGH (ref 70–99)
Potassium: 3.7 mmol/L (ref 3.5–5.1)
Sodium: 131 mmol/L — ABNORMAL LOW (ref 135–145)

## 2018-05-13 LAB — GLUCOSE, CAPILLARY
Glucose-Capillary: 254 mg/dL — ABNORMAL HIGH (ref 70–99)
Glucose-Capillary: 293 mg/dL — ABNORMAL HIGH (ref 70–99)
Glucose-Capillary: 234 mg/dL — ABNORMAL HIGH (ref 70–99)
Glucose-Capillary: 170 mg/dL — ABNORMAL HIGH (ref 70–99)
Glucose-Capillary: 120 mg/dL — ABNORMAL HIGH (ref 70–99)

## 2018-05-13 LAB — CBC
HCT: 37 % — ABNORMAL LOW (ref 39.0–52.0)
Hemoglobin: 12 g/dL — ABNORMAL LOW (ref 13.0–17.0)
MCH: 28.9 pg (ref 26.0–34.0)
MCHC: 32.4 g/dL (ref 30.0–36.0)
MCV: 89.2 fL (ref 80.0–100.0)
Platelets: 153 10*3/uL (ref 150–400)
RBC: 4.15 MIL/uL — ABNORMAL LOW (ref 4.22–5.81)
RDW: 13.2 % (ref 11.5–15.5)
WBC: 3.2 10*3/uL — ABNORMAL LOW (ref 4.0–10.5)
nRBC: 0 % (ref 0.0–0.2)

## 2018-05-13 MED ORDER — CARVEDILOL 3.125 MG PO TABS
3.1250 mg | ORAL_TABLET | Freq: Two times a day (BID) | ORAL | Status: DC
  Administered 2018-05-13 – 2018-05-15 (×4): 3.125 mg via ORAL
  Filled 2018-05-13 (×4): qty 1

## 2018-05-13 MED ORDER — LOSARTAN POTASSIUM 25 MG PO TABS
25.0000 mg | ORAL_TABLET | Freq: Every day | ORAL | Status: DC
  Administered 2018-05-13 – 2018-05-15 (×3): 25 mg via ORAL
  Filled 2018-05-13 (×3): qty 1

## 2018-05-13 MED ORDER — INSULIN ASPART 100 UNIT/ML ~~LOC~~ SOLN
6.0000 [IU] | Freq: Three times a day (TID) | SUBCUTANEOUS | Status: DC
  Administered 2018-05-13 – 2018-05-15 (×6): 6 [IU] via SUBCUTANEOUS

## 2018-05-13 MED ORDER — FUROSEMIDE 20 MG PO TABS
20.0000 mg | ORAL_TABLET | Freq: Every day | ORAL | Status: DC
  Administered 2018-05-13 – 2018-05-14 (×2): 20 mg via ORAL
  Filled 2018-05-13 (×2): qty 1

## 2018-05-13 MED ORDER — GUAIFENESIN 100 MG/5ML PO SOLN
5.0000 mL | ORAL | Status: DC | PRN
  Administered 2018-05-14: 100 mg via ORAL
  Filled 2018-05-13: qty 5

## 2018-05-13 MED ORDER — INSULIN GLARGINE 100 UNIT/ML ~~LOC~~ SOLN
25.0000 [IU] | Freq: Every day | SUBCUTANEOUS | Status: DC
  Administered 2018-05-13 – 2018-05-14 (×2): 25 [IU] via SUBCUTANEOUS
  Filled 2018-05-13 (×2): qty 0.25

## 2018-05-13 NOTE — Progress Notes (Addendum)
[facility] PROGRESS NOTE  Austin Shaw RXY:[id number] DOB: 1965-06-10 DOA: 05/11/2018 PCP: Joaquim Nam, MD  53 yo admitted with fever/cough/general malaise. Positive influenza A. Slow to recover, most likely due to risk factors of poorly controlled DM/obesity/HTN   Assessment/Plan:  Influenza A:   - Chest x-ray with atelectasis -continue tamiflu -supportive therapy -monitor fevers-- slow to recover-- not eating well  Poorly controlled type 2 diabetes:  -home regimen was metformin -Patient has been noncompliant -A1C >12 -will need insulin upon d/c and may be able over time to transition to PO agents once more controlled  morbid obesity:  Estimated body mass index is 53.25 kg/m as calculated from the following:   Height as of this encounter: 5\' 9"  (1.753 m).   Weight as of this encounter: 163.6 kg.   hypertension:  -resume home medications but more than just 2 days/week as he was doing  hyperlipidemia:  -Continue with statin  Cardiomyopathy/chronic systolic heart failure.  -Most recent echo 2016 EF 50% grade 1 DD. This is improvement from EF 20% in past.  -resume lasix when able  Non-compliance. Chart review indicates patient consistently non-compliant with management of DM and occasionally non-compliant with other medication, diet, exercise. 2017 he stopped taking all medications -long discussion  Code Status: full Family Communication: wife at bedside Disposition Plan: home once able to consistently take in PO and vital signs improve-- will re-evaluate this evening in case he has improved enough to go home    HPI/Subjective: Very weak and short of breath Coughing with deep breaths     Objective: Vitals:   05/13/18 0800 05/13/18 0902  BP: (!) 172/87   Pulse: 90   Resp: 17   Temp:  98.9 F (37.2 C)  SpO2: 94%     Intake/Output Summary (Last 24 hours) at 05/13/2018 1030 Last data filed at 05/13/2018 0414 Gross per 24 hour  Intake 990 ml   Output 3 ml  Net 987 ml   Filed Weights   05/11/18 1853 05/12/18 0016 05/13/18 0400  Weight: (!) 161 kg (!) 161.6 kg (!) 163.6 kg    Exam:   General:  In chair, ill appearing  Cardiovascular: tachy but regular  Respiratory: crackles at bases-- appears to be atelectasis  Abdomen: +BS, soft, obese    Data Reviewed: Basic Metabolic Panel: Recent Labs  Lab 05/11/18 1844 05/12/18 0236 05/13/18 0257  NA 130* 133* 131*  K 4.2 4.1 3.7  CL 93* 97* 99  CO2 19* 22 22  GLUCOSE 311* 327* 277*  BUN 20 17 24*  CREATININE 1.10 1.10 1.23  CALCIUM 9.2 8.8* 8.0*   Liver Function Tests: Recent Labs  Lab 05/11/18 1844 05/12/18 0236  AST 41 50*  ALT 29 31  ALKPHOS 68 63  BILITOT 0.5 0.7  PROT 7.8 7.4  ALBUMIN 3.5 3.2*   No results for input(s): LIPASE, AMYLASE in the last 168 hours. No results for input(s): AMMONIA in the last 168 hours. CBC: Recent Labs  Lab 05/11/18 1844 05/12/18 0236 05/13/18 0257  WBC 6.9 7.5 3.2*  NEUTROABS 5.5  --   --   HGB 13.8 12.8* 12.0*  HCT 44.8 40.7 37.0*  MCV 90.7 89.1 89.2  PLT 192 173 153   Cardiac Enzymes: Recent Labs  Lab 05/11/18 1844  TROPONINI 0.03*   BNP (last 3 results) Recent Labs    05/11/18 1844  BNP 55.8    ProBNP (last 3 results) No results for input(s): PROBNP in the last 8760 hours.  CBG: Recent Labs  Lab 05/12/18 0744 05/12/18 1118 05/12/18 1654 05/12/18 2130 05/13/18 0751  GLUCAP 325* 323* 207* 190* 254*    Recent Results (from the past 240 hour(s))  Blood Culture (routine x 2)     Status: None (Preliminary result)   Collection Time: 05/11/18  7:18 PM  Result Value Ref Range Status   Specimen Description BLOOD RIGHT ANTECUBITAL  Final   Special Requests   Final    BOTTLES DRAWN AEROBIC AND ANAEROBIC Blood Culture adequate volume   Culture   Final    NO GROWTH 2 DAYS Performed at [facility], [address] [place], [place] [address]    Report Status PENDING  Incomplete  Blood  Culture (routine x 2)     Status: None (Preliminary result)   Collection Time: 05/11/18  7:50 PM  Result Value Ref Range Status   Specimen Description BLOOD LEFT ANTECUBITAL  Final   Special Requests   Final    BOTTLES DRAWN AEROBIC AND ANAEROBIC Blood Culture adequate volume   Culture   Final    NO GROWTH 2 DAYS Performed at [facility], [address] [place], [place] [address]    Report Status PENDING  Incomplete  MRSA PCR Screening     Status: None   Collection Time: 05/12/18 12:18 AM  Result Value Ref Range Status   MRSA by PCR NEGATIVE NEGATIVE Final    Comment:        The GeneXpert MRSA Assay (FDA approved for NASAL specimens only), is one component of a comprehensive MRSA colonization surveillance program. It is not intended to diagnose MRSA infection nor to guide or monitor treatment for MRSA infections. Performed at [facility], [address], [place], [place] [address]      Studies: Dg Chest 2 View  Result Date: 05/11/2018 CLINICAL DATA:  Shortness of breath. Cough for a few days. History of COPD and diabetes. EXAM: CHEST - 2 VIEW COMPARISON:  08/05/2014 FINDINGS: Chronic elevation of the right hemidiaphragm, unchanged since prior study. Cardiac enlargement. New pulmonary vascular congestion with perihilar interstitial changes likely representing interstitial edema. No focal consolidation. No blunting of costophrenic angles. No pneumothorax. Mediastinal contours appear intact. Calcification of the aorta. Degenerative changes in the spine. IMPRESSION: Cardiac enlargement with pulmonary vascular congestion and interstitial edema. Electronically Signed   By: Burman Nieves M.D.   On: 05/11/2018 20:45   Dg Chest Port 1 View  Result Date: 05/13/2018 CLINICAL DATA:  Fever and cough EXAM: PORTABLE CHEST 1 VIEW COMPARISON:  05/11/2018 FINDINGS: Overall decreased inspiratory effort is noted with crowding of the vascular markings. Stable right basilar atelectatic  changes are seen. Mild vascular congestion is again noted. No focal confluent infiltrate is noted. IMPRESSION: Mild vascular congestion is similar to that seen on the prior exam. Stable right basilar atelectasis. Overall poor inspiratory effort. Electronically Signed   By: Alcide Clever M.D.   On: 05/13/2018 07:36    Scheduled Meds: . carvedilol  3.125 mg Oral BID WC  . enoxaparin (LOVENOX) injection  80 mg Subcutaneous Daily  . insulin aspart  0-15 Units Subcutaneous TID WC  . insulin aspart  0-5 Units Subcutaneous QHS  . insulin aspart  6 Units Subcutaneous TID WC  . insulin glargine  25 Units Subcutaneous QHS  . losartan  25 mg Oral Daily  . oseltamivir  75 mg Oral BID   Continuous Infusions:   Principal Problem:   Influenza due to identified novel  influenza A virus with other respiratory manifestations Active Problems:   Diabetes mellitus type 2, uncontrolled, with complications (HCC)   OBESITY, MORBID   SOB (shortness of breath)   Cardiomyopathy (HCC)   Acute febrile illness   Chronic systolic heart failure (HCC)   Non-compliance    Time spent: 35 minutes    Joseph Art DO  [facility]   05/13/2018, 10:30 AM  LOS: 0 days      How to contact the [facility] Attending or Consulting provider 7A - 7P or covering provider during after hours 7P -7A, for this patient?  1. Check the care team in [facility] and look for a) attending/consulting TR[facility] provider listed and b) the [facility] team listed 2. Log into [email or web address] and use [place]'s universal password to access. If you do not have the password, please contact the hospital operator. 3. Locate the [facility] provider you are looking for under [facility] and page to a number that you can be directly reached. 4. If you still have difficulty reaching the provider, please page the [facility] (Director on Call) for the Hospitalists listed on amion for assistance.

## 2018-05-13 NOTE — Plan of Care (Signed)

## 2018-05-14 DIAGNOSIS — J09X2 Influenza due to identified novel influenza A virus with other respiratory manifestations: Secondary | ICD-10-CM | POA: Diagnosis not present

## 2018-05-14 LAB — GLUCOSE, CAPILLARY
Glucose-Capillary: 173 mg/dL — ABNORMAL HIGH (ref 70–99)
Glucose-Capillary: 179 mg/dL — ABNORMAL HIGH (ref 70–99)
Glucose-Capillary: 172 mg/dL — ABNORMAL HIGH (ref 70–99)
Glucose-Capillary: 153 mg/dL — ABNORMAL HIGH (ref 70–99)

## 2018-05-14 MED ORDER — FUROSEMIDE 10 MG/ML IJ SOLN
40.0000 mg | Freq: Once | INTRAMUSCULAR | Status: AC
  Administered 2018-05-14: 40 mg via INTRAVENOUS
  Filled 2018-05-14: qty 4

## 2018-05-14 MED ORDER — FUROSEMIDE 10 MG/ML IJ SOLN
40.0000 mg | Freq: Once | INTRAMUSCULAR | Status: AC
  Administered 2018-05-15: 40 mg via INTRAVENOUS
  Filled 2018-05-14: qty 4

## 2018-05-14 NOTE — Progress Notes (Signed)
[facility] PROGRESS NOTE  Austin Shaw EUM:[id number] DOB: 09/06/1965 DOA: 05/11/2018 PCP: Joaquim Nam, MD  53 yo admitted with fever/cough/general malaise. Positive influenza A. Slow to recover, most likely due to risk factors of poorly controlled DM/obesity/HTN   Assessment/Plan:  Influenza A:   - Chest x-ray with atelectasis -continue tamiflu -supportive therapy -continues to be slow to recover but not surprising with his poorly controlled DM and morbid obesity  Poorly controlled type 2 diabetes:  -home regimen was metformin -Patient has been noncompliant -A1C >12 -will need insulin upon d/c and may be able over time to transition to PO agents once more controlled  morbid obesity:  Estimated body mass index is 53.25 kg/m as calculated from the following:   Height as of this encounter: 5\' 9"  (1.753 m).   Weight as of this encounter: 163.6 kg.   hypertension:  -resume home medications but more than just 2 days/week as he was doing-- actually daily as originally prescribed  hyperlipidemia:  -Continue with statin  Cardiomyopathy/acute on chronic diastolicic heart failure.  -Most recent echo 2016 EF 50% grade 1 DD. This is improvement from EF 20% in past.  -lasix IV for now  Non-compliance. Chart review indicates patient consistently non-compliant with management of DM and occasionally non-compliant with other medication, diet, exercise. 2017 he stopped taking all medications -long discussion regarding complications  Code Status: full Family Communication: wife at bedside Disposition Plan: suspect home in AM    HPI/Subjective: C/o LE edema    Objective: Vitals:   05/14/18 1146 05/14/18 1628  BP: 129/69 (!) 144/89  Pulse: 80 89  Resp: (!) 22   Temp: 98.9 F (37.2 C) 98.6 F (37 C)  SpO2: 99% 96%    Intake/Output Summary (Last 24 hours) at 05/14/2018 1747 Last data filed at 05/14/2018 1634 Gross per 24 hour  Intake 640 ml  Output 1400 ml   Net -760 ml   Filed Weights   05/11/18 1853 05/12/18 0016 05/13/18 0400  Weight: (!) 161 kg (!) 161.6 kg (!) 163.6 kg    Exam:   +LE edema, b/l tight  rrr  Crackles at bases b/l    Data Reviewed: Basic Metabolic Panel: Recent Labs  Lab 05/11/18 1844 05/12/18 0236 05/13/18 0257  NA 130* 133* 131*  K 4.2 4.1 3.7  CL 93* 97* 99  CO2 19* 22 22  GLUCOSE 311* 327* 277*  BUN 20 17 24*  CREATININE 1.10 1.10 1.23  CALCIUM 9.2 8.8* 8.0*   Liver Function Tests: Recent Labs  Lab 05/11/18 1844 05/12/18 0236  AST 41 50*  ALT 29 31  ALKPHOS 68 63  BILITOT 0.5 0.7  PROT 7.8 7.4  ALBUMIN 3.5 3.2*   No results for input(s): LIPASE, AMYLASE in the last 168 hours. No results for input(s): AMMONIA in the last 168 hours. CBC: Recent Labs  Lab 05/11/18 1844 05/12/18 0236 05/13/18 0257  WBC 6.9 7.5 3.2*  NEUTROABS 5.5  --   --   HGB 13.8 12.8* 12.0*  HCT 44.8 40.7 37.0*  MCV 90.7 89.1 89.2  PLT 192 173 153   Cardiac Enzymes: Recent Labs  Lab 05/11/18 1844  TROPONINI 0.03*   BNP (last 3 results) Recent Labs    05/11/18 1844  BNP 55.8    ProBNP (last 3 results) No results for input(s): PROBNP in the last 8760 hours.  CBG: Recent Labs  Lab 05/13/18 1638 05/13/18 2111 05/14/18 0751 05/14/18 1144 05/14/18 1708  GLUCAP 170* 120* 173* 179*  172*    Recent Results (from the past 240 hour(s))  Blood Culture (routine x 2)     Status: None (Preliminary result)   Collection Time: 05/11/18  7:18 PM  Result Value Ref Range Status   Specimen Description BLOOD RIGHT ANTECUBITAL  Final   Special Requests   Final    BOTTLES DRAWN AEROBIC AND ANAEROBIC Blood Culture adequate volume   Culture   Final    NO GROWTH 3 DAYS Performed at [facility], [address] [place], [place] [address]    Report Status PENDING  Incomplete  Blood Culture (routine x 2)     Status: None (Preliminary result)   Collection Time: 05/11/18  7:50 PM  Result Value Ref Range  Status   Specimen Description BLOOD LEFT ANTECUBITAL  Final   Special Requests   Final    BOTTLES DRAWN AEROBIC AND ANAEROBIC Blood Culture adequate volume   Culture   Final    NO GROWTH 3 DAYS Performed at [facility], [address], [place], [place] [address]    Report Status PENDING  Incomplete  MRSA PCR Screening     Status: None   Collection Time: 05/12/18 12:18 AM  Result Value Ref Range Status   MRSA by PCR NEGATIVE NEGATIVE Final    Comment:        The GeneXpert MRSA Assay (FDA approved for NASAL specimens only), is one component of a comprehensive MRSA colonization surveillance program. It is not intended to diagnose MRSA infection nor to guide or monitor treatment for MRSA infections. Performed at [facility], [address], [place], [place] [address]      Studies: Dg Chest Port 1 View  Result Date: 05/13/2018 CLINICAL DATA:  Fever and cough EXAM: PORTABLE CHEST 1 VIEW COMPARISON:  05/11/2018 FINDINGS: Overall decreased inspiratory effort is noted with crowding of the vascular markings. Stable right basilar atelectatic changes are seen. Mild vascular congestion is again noted. No focal confluent infiltrate is noted. IMPRESSION: Mild vascular congestion is similar to that seen on the prior exam. Stable right basilar atelectasis. Overall poor inspiratory effort. Electronically Signed   By: Alcide Clever M.D.   On: 05/13/2018 07:36    Scheduled Meds: . carvedilol  3.125 mg Oral BID WC  . enoxaparin (LOVENOX) injection  80 mg Subcutaneous Daily  . [START ON 05/15/2018] furosemide  40 mg Intravenous Once  . insulin aspart  0-15 Units Subcutaneous TID WC  . insulin aspart  0-5 Units Subcutaneous QHS  . insulin aspart  6 Units Subcutaneous TID WC  . insulin glargine  25 Units Subcutaneous QHS  . losartan  25 mg Oral Daily  . oseltamivir  75 mg Oral BID   Continuous Infusions:   Principal Problem:   Influenza due to identified novel influenza A virus with  other respiratory manifestations Active Problems:   Diabetes mellitus type 2, uncontrolled, with complications (HCC)   OBESITY, MORBID   SOB (shortness of breath)   Cardiomyopathy (HCC)   Acute febrile illness   Chronic systolic heart failure (HCC)   Non-compliance    Time spent: 35 minutes    Joseph Art DO  [facility]   05/14/2018, 5:47 PM  LOS: 0 days      How to contact the [facility] Attending or Consulting provider 7A - 7P or covering provider during after hours 7P -7A, for this patient?  1. Check the care team in [facility] and look for a) attending/consulting TRH  provider listed and b) the [facility] team listed 2. Log into [email or web address] and use 's universal password to access. If you do not have the password, please contact the hospital operator. 3. Locate the [facility] provider you are looking for under [facility] and page to a number that you can be directly reached. 4. If you still have difficulty reaching the provider, please page the [facility] (Director on Call) for the Hospitalists listed on amion for assistance.

## 2018-05-14 NOTE — Plan of Care (Signed)
  Problem: Activity: Goal: Risk for activity intolerance will decrease Outcome: Progressing   Problem: Elimination: Goal: Will not experience complications related to bowel motility Outcome: Progressing   Problem: Pain Managment: Goal: General experience of comfort will improve Outcome: Progressing   

## 2018-05-14 NOTE — Progress Notes (Signed)
Inpatient Diabetes Program Recommendations  AACE/ADA: New Consensus Statement on Inpatient Glycemic Control (2015)  Target Ranges:  Prepandial:   less than 140 mg/dL      Peak postprandial:   less than 180 mg/dL (1-2 hours)      Critically ill patients:  140 - 180 mg/dL   Lab Results  Component Value Date   GLUCAP 179 (H) 05/14/2018   HGBA1C 13.4 (H) 05/12/2018    Review of Glycemic Control Results for Austin Shaw, Austin Shaw (MRN [id number]) as of 05/14/2018 16:44  Ref. Range 05/13/2018 16:38 05/13/2018 21:11 05/14/2018 07:51 05/14/2018 11:44  Glucose-Capillary Latest Ref Range: 70 - 99 mg/dL 170 (H) 120 (H) 173 (H) 179 (H)   Diabetes history: DM2 Outpatient Diabetes medications: metformin 1500 mg QAM Current orders for Inpatient glycemic control: Lantus 25 units QHS, Novolog 0-15 units tidwc and hs, Novolog 6 units TID  Inpatient Diabetes Program Recommendations:     Spoke with patient regarding outpatient management of diabetes. Patient admits to missing doses of Metformin frequency, but has been taking it occasionally. Patient reports, "Since I got my new job, I have just fallen off the good habits." Patient and wife report stopping medications, including insulin (per pen), back in 2017 because his A1C was improving and had decreased down to 8%.  Reviewed patient's current A1c of 13.4%. Explained what a A1c is and what it measures. Also reviewed goal A1c with patient, importance of good glucose control @ home, and blood sugar goals. Reviewed patho of diabetes, need for insulin, role of pancreas, signs and symptoms of hyperglycemia, vascular changes that occur and comorbidites.  Patient has meter and testing supplies. Occasionally, he checks CBGs. Encouraged to begin checking more consistently, at least 2-3 times per day and taking values with him to next PCP visit. Patient has gym membership and wife encourages him to go 3-5 times per week. He denies drinking sugary beverages, however, when asked  specifics he says, "I love lemonade." Discussed alternatives and reviewed total carbohydrates and encouragement provided on selecting foods/beverages based off of nutritional value.  Patient refusing outpatient education and dietitian for additional reinforcement. List provided for outpatient endocrinology.  Of note per insurance carrier, patient would benefit from [place] or Levmemir And Novolog as options for outpatient because of coverage. Patient has used sliding scale in the past and feels comfortable.  Reviewed with patient and spouse on insulin pen use at home. Reviewed contents of insulin flexpen starter kit. Reviewed all steps if insulin pen including attachment of needle, 2-unit air shot, dialing up dose, giving injection, removing needle, disposal of sharps, storage of unused insulin, disposal of insulin etc. Patient able to provide successful return demonstration. Also reviewed troubleshooting with insulin pen. MD to give patient Rxs for insulin pens and insulin pen needles.  At this time, patient has no further questions at this time.   Thanks, Bronson Curb, MSN, RNC-OB Diabetes Coordinator [phone] (8a-5p)

## 2018-05-15 ENCOUNTER — Other Ambulatory Visit: Payer: Self-pay | Admitting: Internal Medicine

## 2018-05-15 DIAGNOSIS — J09X2 Influenza due to identified novel influenza A virus with other respiratory manifestations: Secondary | ICD-10-CM | POA: Diagnosis not present

## 2018-05-15 LAB — GLUCOSE, CAPILLARY: Glucose-Capillary: 160 mg/dL — ABNORMAL HIGH (ref 70–99)

## 2018-05-15 LAB — BASIC METABOLIC PANEL
Anion gap: 13 (ref 5–15)
BUN: 18 mg/dL (ref 6–20)
CO2: 25 mmol/L (ref 22–32)
Calcium: 8.2 mg/dL — ABNORMAL LOW (ref 8.9–10.3)
Chloride: 96 mmol/L — ABNORMAL LOW (ref 98–111)
Creatinine, Ser: 1.21 mg/dL (ref 0.61–1.24)
GFR calc Af Amer: >60 mL/min (ref >60)
GFR calc non Af Amer: >60 mL/min (ref >60)
Glucose, Bld: 192 mg/dL — ABNORMAL HIGH (ref 70–99)
Potassium: 3.7 mmol/L (ref 3.5–5.1)
Sodium: 134 mmol/L — ABNORMAL LOW (ref 135–145)

## 2018-05-15 MED ORDER — INSULIN ASPART 100 UNIT/ML FLEXPEN: PEN_INJECTOR | SUBCUTANEOUS | 1 refills | Status: DC

## 2018-05-15 MED ORDER — METFORMIN HCL ER 500 MG PO TB24: 1500.0000 mg | ORAL_TABLET | Freq: Every day | ORAL | 5 refills | Status: DC

## 2018-05-15 MED ORDER — INSULIN DETEMIR 100 UNIT/ML FLEXPEN: 25.0000 [IU] | PEN_INJECTOR | Freq: Every day | SUBCUTANEOUS | 11 refills | Status: DC

## 2018-05-15 MED ORDER — OSELTAMIVIR PHOSPHATE 75 MG PO CAPS: 75.0000 mg | ORAL_CAPSULE | Freq: Two times a day (BID) | ORAL | 0 refills | Status: DC

## 2018-05-15 MED ORDER — FUROSEMIDE 40 MG PO TABS: 20.0000 mg | ORAL_TABLET | Freq: Every day | ORAL | 0 refills | Status: DC | PRN

## 2018-05-15 MED ORDER — LOSARTAN POTASSIUM 25 MG PO TABS: 25.0000 mg | ORAL_TABLET | Freq: Every day | ORAL | 0 refills | Status: DC

## 2018-05-15 NOTE — Care Management Note (Signed)
Case Management Note  Patient Details  Name: Parley Rufenacht MRN: [id number] Date of Birth: Mar 30, 1966  Subjective/Objective:                    Action/Plan:   PTA independent from home with wife.  Pt has PCP and denied barriers with paying for medications including new DM meds.  CM contacted pharmacy of choice and confirmed copay of $60 each for both novolog and levemir - per pharmacy discount coupon card will yield a higher copay.  CM informed wife/pt of copay - wife denied hardship with cost.  No CM needs determined prior to discharge - CM signing off   Expected Discharge Date:  05/15/18               Expected Discharge Plan:  Home/Self Care  In-House Referral:     Discharge planning Services  CM Consult  Post Acute Care Choice:    Choice offered to:     DME Arranged:    DME Agency:     HH Arranged:    HH Agency:     Status of Service:  In process, will continue to follow  If discussed at Long Length of Stay Meetings, dates discussed:    Additional Comments:  Cherylann Parr, RN 05/15/2018, 8:44 AM

## 2018-05-15 NOTE — Plan of Care (Signed)
  Problem: Activity: Goal: Risk for activity intolerance will decrease Outcome: Adequate for Discharge   Problem: Elimination: Goal: Will not experience complications related to bowel motility Outcome: Completed/Met Goal: Will not experience complications related to urinary retention Outcome: Completed/Met   Problem: Pain Managment: Goal: General experience of comfort will improve Outcome: Completed/Met   Problem: Safety: Goal: Ability to remain free from injury will improve Outcome: Completed/Met     DC instructions given to patient and family.  Education completed. Medication regimen reviewed and encouraged to take medications as prescribed.  eRx sent to [facility] by MD. PIV DC, hemostasis achieved. VSS. All belongings sent home with patient.  Pt escorted by NT via wheelchair to private vehicle driven by family.

## 2018-05-15 NOTE — Discharge Summary (Signed)
Physician Discharge Summary  Austin Shaw INO:[id number] DOB: 01/08/1966 DOA: 05/11/2018  PCP: Austin Ghent, MD  Admit date: 05/11/2018 Discharge date: 05/15/2018  Admitted From: home Discharge disposition: home   Recommendations for Outpatient Follow-Up:   1. Titration of BP medications as well as insulin for optimal control   Discharge Diagnosis:   Principal Problem:   Influenza due to identified novel influenza A virus with other respiratory manifestations Active Problems:   Diabetes mellitus type 2, uncontrolled, with complications (HCC)   OBESITY, MORBID   SOB (shortness of breath)   Cardiomyopathy (HCC)   Acute febrile illness   Chronic systolic heart failure (HCC)   Non-compliance    Discharge Condition: Improved.  Diet recommendation: Low sodium, heart healthy.  Carbohydrate-modified Wound care: None.  Code status: Full.   History of Present Illness:   Austin Shaw is a 53 y.o. male with medical history significant of diabetes, hypertension, morbid obesity, hyperlipidemia, medication noncompliance and obstructive sleep apnea was brought in for 1 day history of persistent cough fever and some chills.  Patient has not been compliant with his medications for several weeks.  He has no sick contacts.  Denied any nausea vomiting or GI symptoms.  He was seen in the ER with elevated lactic acid 2.0 as well as temperature of almost 102.  His initial evaluation including chest x-ray and urinalysis were negative.  Viral assays are currently pending.  Due to patient's relatively immunocompromised status with diabetes and poor compliance is being admitted to the medical service for full evaluation and treatment.[name]   Hospital Course by Problem:   Influenza A:  -Chest x-ray with atelectasis -continue tamiflu -supportive therapy- incentive spirometry -was slow to recover  Poorly controlled type 2 diabetes:  -home regimen was metformin -Patient has been  noncompliant -A1C >12 -will need insulin upon d/c and may be able over time to transition to PO agents once more controlled  morbid obesity:  Estimated body mass index is 53.25 kg/m as calculated from the following:   Height as of this encounter: 5' 9" (1.753 m).   Weight as of this encounter: 163.6 kg.   hypertension: -resume home medications but more than just 2 days/week as he was doing-- actually daily as originally prescribed  hyperlipidemia: -Continue with statin  Cardiomyopathy/acute on chronic diastolicic heart failure.  -Most recent echo 2016 EF 50% grade 1 DD. This is improvement from EF 20% in past.  -responded well to IV lasix  Non-compliance. Chart review indicates patient consistently non-compliant with management of DM and occasionally non-compliant with other medication, diet, exercise. 2017 he stopped taking all medications -long discussion regarding complications     Medical Consultants:      Discharge Exam:   Vitals:   05/14/18 1628 05/14/18 1920  BP: (!) 144/89 137/76  Pulse: 89 89  Resp:    Temp: 98.6 F (37 C) 99.1 F (37.3 C)  SpO2: 96% 97%   Vitals:   05/14/18 1146 05/14/18 1628 05/14/18 1920 05/15/18 0500  BP: 129/69 (!) 144/89 137/76   Pulse: 80 89 89   Resp: (!) 22     Temp: 98.9 F (37.2 C) 98.6 F (37 C) 99.1 F (37.3 C)   TempSrc: Oral Oral Oral   SpO2: 99% 96% 97%   Weight:    (!) 159.2 kg  Height:        General exam: Appears calm and comfortable. Much improved this AM.    The results of significant diagnostics  from this hospitalization (including imaging, microbiology, ancillary and laboratory) are listed below for reference.     Procedures and Diagnostic Studies:   Dg Chest 2 View  Result Date: 05/11/2018 CLINICAL DATA:  Shortness of breath. Cough for a few days. History of COPD and diabetes. EXAM: CHEST - 2 VIEW COMPARISON:  08/05/2014 FINDINGS: Chronic elevation of the right hemidiaphragm, unchanged since  prior study. Cardiac enlargement. New pulmonary vascular congestion with perihilar interstitial changes likely representing interstitial edema. No focal consolidation. No blunting of costophrenic angles. No pneumothorax. Mediastinal contours appear intact. Calcification of the aorta. Degenerative changes in the spine. IMPRESSION: Cardiac enlargement with pulmonary vascular congestion and interstitial edema. Electronically Signed   By: Lucienne Capers M.D.   On: 05/11/2018 20:45     Labs:   Basic Metabolic Panel: Recent Labs  Lab 05/11/18 1844 05/12/18 0236 05/13/18 0257 05/15/18 0310  NA 130* 133* 131* 134*  K 4.2 4.1 3.7 3.7  CL 93* 97* 99 96*  CO2 19* _0 GLUCOSE 311* 327* 277* 192*  BUN 20 17 24* 18  CREATININE 1.10 1.10 1.23 1.21  CALCIUM 9.2 8.8* 8.0* 8.2*   GFR Estimated Creatinine Clearance: 108.4 mL/min (by C-G formula based on SCr of 1.21 mg/dL). Liver Function Tests: Recent Labs  Lab 05/11/18 1844 05/12/18 0236  AST 41 50*  ALT 29 31  ALKPHOS 68 63  BILITOT 0.5 0.7  PROT 7.8 7.4  ALBUMIN 3.5 3.2*   No results for input(s): LIPASE, AMYLASE in the last 168 hours. No results for input(s): AMMONIA in the last 168 hours. Coagulation profile No results for input(s): INR, PROTIME in the last 168 hours.  CBC: Recent Labs  Lab 05/11/18 1844 05/12/18 0236 05/13/18 0257  WBC 6.9 7.5 3.2*  NEUTROABS 5.5  --   --   HGB 13.8 12.8* 12.0*  HCT 44.8 40.7 37.0*  MCV 90.7 89.1 89.2  PLT 192 173 153   Cardiac Enzymes: Recent Labs  Lab 05/11/18 1844  TROPONINI 0.03*   BNP: Invalid input(s): POCBNP CBG: Recent Labs  Lab 05/14/18 0751 05/14/18 1144 05/14/18 1708 05/14/18 2034 05/15/18 0734  GLUCAP 173* 179* 172* 153* 160*   D-Dimer No results for input(s): DDIMER in the last 72 hours. Hgb A1c No results for input(s): HGBA1C in the last 72 hours. Lipid Profile No results for input(s): CHOL, HDL, LDLCALC, TRIG, CHOLHDL, LDLDIRECT in the last 72  hours. Thyroid function studies No results for input(s): TSH, T4TOTAL, T3FREE, THYROIDAB in the last 72 hours.  Invalid input(s): FREET3 Anemia work up No results for input(s): VITAMINB12, FOLATE, FERRITIN, TIBC, IRON, RETICCTPCT in the last 72 hours. Microbiology Recent Results (from the past 240 hour(s))  Blood Culture (routine x 2)     Status: None (Preliminary result)   Collection Time: 05/11/18  7:18 PM  Result Value Ref Range Status   Specimen Description BLOOD RIGHT ANTECUBITAL  Final   Special Requests   Final    BOTTLES DRAWN AEROBIC AND ANAEROBIC Blood Culture adequate volume   Culture   Final    NO GROWTH 3 DAYS Performed at [facility], [address] [place], [place] [address]    Report Status PENDING  Incomplete  Blood Culture (routine x 2)     Status: None (Preliminary result)   Collection Time: 05/11/18  7:50 PM  Result Value Ref Range Status   Specimen Description BLOOD LEFT ANTECUBITAL  Final   Special Requests   Final  BOTTLES DRAWN AEROBIC AND ANAEROBIC Blood Culture adequate volume   Culture   Final    NO GROWTH 3 DAYS Performed at [facility], [place] [address], [place], [place] [address]    Report Status PENDING  Incomplete  MRSA PCR Screening     Status: None   Collection Time: 05/12/18 12:18 AM  Result Value Ref Range Status   MRSA by PCR NEGATIVE NEGATIVE Final    Comment:        The GeneXpert MRSA Assay (FDA approved for NASAL specimens only), is one component of a comprehensive MRSA colonization surveillance program. It is not intended to diagnose MRSA infection nor to guide or monitor treatment for MRSA infections. Performed at [facility], [place] [address], [place], [place] [address]      Discharge Instructions:   Discharge Instructions    Diet - low sodium heart healthy   Complete by:  As directed    Diet - low sodium heart healthy   Complete by:  As directed    Diet Carb Modified   Complete by:  As directed      Diet Carb Modified   Complete by:  As directed    Discharge instructions   Complete by:  As directed    Check your blood sugars and take your medications as prescribed   Discharge instructions   Complete by:  As directed    Bring log of blood sugars to PCP   Increase activity slowly   Complete by:  As directed    Increase activity slowly   Complete by:  As directed      Allergies as of 05/15/2018   No Known Allergies     Medication List    TAKE these medications   acetaminophen 500 MG tablet Commonly known as:  TYLENOL Take 1,000 mg by mouth every 6 (six) hours as needed for mild pain or fever.   carvedilol 3.125 MG tablet Commonly known as:  COREG Take 1 tablet (3.125 mg total) by mouth 2 (two) times daily with a meal. What changed:  when to take this   furosemide 40 MG tablet Commonly known as:  LASIX Take 0.5 tablets (20 mg total) by mouth daily as needed for fluid or edema. What changed:    when to take this  reasons to take this   glucose blood test strip Commonly known as:  ONE TOUCH ULTRA TEST Check blood sugar twice a day and as directed. Dx E11.8   insulin aspart 100 UNIT/ML FlexPen Commonly known as:  NOVOLOG FLEXPEN CBG 70 - 120: 0 units;CBG 121 - 150: 2 units;CBG 151 - 200: 3 units;CBG 201 - 250: 5 units;CBG 251 - 300: 8 units;CBG 301 - 350: 11 units;CBG 351 - 400: 15 units-- TID With meals   Insulin Detemir 100 UNIT/ML Pen Commonly known as:  LEVEMIR Inject 25 Units into the skin daily.   losartan 25 MG tablet Commonly known as:  COZAAR Take 1 tablet (25 mg total) by mouth daily. What changed:  when to take this   metFORMIN 500 MG 24 hr tablet Commonly known as:  GLUCOPHAGE-XR Take 3 tablets (1,500 mg total) by mouth daily with breakfast. What changed:    how much to take  when to take this   [place] KIT w/Device Kit Check blood sugar twice a day and as directed. Dx E11.8   oseltamivir 75 MG capsule Commonly known as:   TAMIFLU Take 1 capsule (75  mg total) by mouth 2 (two) times daily.      Follow-up Information    Austin Ghent, MD Follow up in 1 week(s).   Specialty:  Family Medicine Contact information: [place] [address] [phone]            Time coordinating discharge: 25 min  Signed:  Geradine Girt DO  [facility] 05/15/2018, 8:24 AM

## 2018-05-16 LAB — CULTURE, BLOOD (ROUTINE X 2)
Culture: NO GROWTH
Culture: NO GROWTH
Special Requests: ADEQUATE
Special Requests: ADEQUATE

## 2018-05-17 ENCOUNTER — Telehealth: Payer: Self-pay

## 2018-05-17 NOTE — Telephone Encounter (Signed)
Left message for patient to call back to follow up after recent ER visit and make an appointment with Dr. Para March.  Patient is not TCM eligible due to insurance.

## 2018-05-28 ENCOUNTER — Ambulatory Visit: Payer: BC Managed Care – PPO | Admitting: Family Medicine

## 2018-05-28 ENCOUNTER — Encounter: Payer: Self-pay | Admitting: Family Medicine

## 2018-05-28 VITALS — BP 122/82 | HR 135 | Temp 98.8°F | Ht 69.0 in | Wt 347.6 lb

## 2018-05-28 DIAGNOSIS — IMO0002 Reserved for concepts with insufficient information to code with codable children: Secondary | ICD-10-CM

## 2018-05-28 DIAGNOSIS — E118 Type 2 diabetes mellitus with unspecified complications: Secondary | ICD-10-CM | POA: Diagnosis not present

## 2018-05-28 DIAGNOSIS — E1165 Type 2 diabetes mellitus with hyperglycemia: Secondary | ICD-10-CM | POA: Diagnosis not present

## 2018-05-28 DIAGNOSIS — J09X2 Influenza due to identified novel influenza A virus with other respiratory manifestations: Secondary | ICD-10-CM | POA: Diagnosis not present

## 2018-05-28 LAB — CBC WITH DIFFERENTIAL/PLATELET
Basophils Absolute: 0.1 10*3/uL (ref 0.0–0.1)
Basophils Relative: 0.9 % (ref 0.0–3.0)
Eosinophils Absolute: 0.3 10*3/uL (ref 0.0–0.7)
Eosinophils Relative: 4.2 % (ref 0.0–5.0)
HCT: 39.3 % (ref 39.0–52.0)
Hemoglobin: 12.8 g/dL — ABNORMAL LOW (ref 13.0–17.0)
Lymphocytes Relative: 30.2 % (ref 12.0–46.0)
Lymphs Abs: 1.8 10*3/uL (ref 0.7–4.0)
MCHC: 32.6 g/dL (ref 30.0–36.0)
MCV: 88.1 fl (ref 78.0–100.0)
Monocytes Absolute: 0.5 10*3/uL (ref 0.1–1.0)
Monocytes Relative: 8.4 % (ref 3.0–12.0)
Neutro Abs: 3.4 10*3/uL (ref 1.4–7.7)
Neutrophils Relative %: 56.3 % (ref 43.0–77.0)
Platelets: 288 10*3/uL (ref 150.0–400.0)
RBC: 4.46 Mil/uL (ref 4.22–5.81)
RDW: 13.6 % (ref 11.5–15.5)
WBC: 6 10*3/uL (ref 4.0–10.5)

## 2018-05-28 LAB — BASIC METABOLIC PANEL
BUN: 16 mg/dL (ref 6–23)
CO2: 28 mEq/L (ref 19–32)
Calcium: 9.3 mg/dL (ref 8.4–10.5)
Chloride: 101 mEq/L (ref 96–112)
Creatinine, Ser: 0.98 mg/dL (ref 0.40–1.50)
GFR: 97.55 mL/min (ref >60.00)
Glucose, Bld: 131 mg/dL — ABNORMAL HIGH (ref 70–99)
Potassium: 4.2 mEq/L (ref 3.5–5.1)
Sodium: 137 mEq/L (ref 135–145)

## 2018-05-28 MED ORDER — CARVEDILOL 3.125 MG PO TABS: 3.1250 mg | ORAL_TABLET | Freq: Two times a day (BID) | ORAL | Status: DC

## 2018-05-28 NOTE — Progress Notes (Signed)
Admitted with flu.  Had not been vaccinated.  D/w pt.  Flu shot encouraged, still declined at office visit.  He is aware that he is putting himself (and others around him) at risk of dying of the flu by not getting vaccinated.  DM2.  Taking 25 units levemir 25 units at baseline.  Using novolog BID, AM and PM but not midday due to schedule limitation.  Wife is helping with med admin.  Sugars 100-200 in the last ~10 days, now that he is back to exercising and working on his diet..    HTN.  Taking losartan 25mg  a day, coreg 3.125 BID.  Compliant with medication.  He feels better compared to admission.  Fevers resolved. No cough.  He is up to 1750 on IS now.  He went back to the gym this AM, walked 2.5 miles.    "I know I gotta do it" discussing his med adherence.  Noted the flu shot is still declined.  PMH and SH reviewed  ROS: Per HPI unless specifically indicated in ROS section   Meds, vitals, and allergies reviewed.   GEN: nad, alert and oriented HEENT: mucous membranes moist NECK: supple w/o LA CV: Tacky but regular. PULM: ctab, no inc wob ABD: soft, +bs EXT: no edema SKIN: no acute rash

## 2018-05-28 NOTE — Patient Instructions (Signed)
Go to the lab on the way out.  We'll contact you with your lab report. Take another dose of carvedilol this AM.   You may need 2 carvedilol tabs twice a day.  Start with 3 tabs a day for now.  Check your pulse prior to carvedilol dose.  If your pulse is still >100, then you'll need 2 tabs with that dose.  If pulse is 99 or lower, then take 1 tab.    Update me about your pulse and dosing (average number of pills) by the end of the week.   Keep drinking enough fluid to keep your urine clear.    If you get short of breath, then go to the ER.  Take care.  Glad to see you.

## 2018-05-29 NOTE — Assessment & Plan Note (Signed)
Admitted with influenza, treated.  This complicated all of his other medical issues including diabetes and cardiac considerations.  Discussed.  Declined flu vaccination.  Given his blood pressure and diabetes is reasonable to recheck his labs today.  Encourage med compliance.  Increase carvedilol up to 3 tablets a day initially, he may need to increase to 4 tablets a day to control his tachycardia.  Discussed.  If he is having any chest pain or shortness of breath then he needs to go back to the emergency room.  I want him to update me about his pulse in the near future, when he is on a higher dose of carvedilol.  See notes on after visit summary.  Plan discussed with patient in detail and he understood.  >25 minutes spent in face to face time with patient, >50% spent in counselling or coordination of care

## 2018-05-31 ENCOUNTER — Other Ambulatory Visit: Payer: Self-pay | Admitting: Family Medicine

## 2018-05-31 MED ORDER — CARVEDILOL 3.125 MG PO TABS: 3.1250 mg | ORAL_TABLET | Freq: Two times a day (BID) | ORAL | Status: DC

## 2018-06-20 ENCOUNTER — Other Ambulatory Visit: Payer: Self-pay | Admitting: Family Medicine

## 2018-07-12 ENCOUNTER — Other Ambulatory Visit: Payer: Self-pay | Admitting: Family Medicine

## 2018-07-12 DIAGNOSIS — IMO0002 Reserved for concepts with insufficient information to code with codable children: Secondary | ICD-10-CM

## 2018-07-12 DIAGNOSIS — E1165 Type 2 diabetes mellitus with hyperglycemia: Secondary | ICD-10-CM

## 2018-07-12 DIAGNOSIS — E118 Type 2 diabetes mellitus with unspecified complications: Principal | ICD-10-CM

## 2018-07-12 NOTE — Telephone Encounter (Signed)
Will route to PCP because i'm not sure what does pt should be taking, med list says one dose, refill request says another does and mychart message says a different one

## 2018-07-14 NOTE — Telephone Encounter (Signed)
Please verify current dose and use.  Let me know.   And he needs web visit re: DM2.   Thanks.

## 2018-07-15 MED ORDER — METFORMIN HCL ER 500 MG PO TB24: 1500.0000 mg | ORAL_TABLET | Freq: Every day | ORAL | 1 refills | Status: DC

## 2018-07-15 NOTE — Telephone Encounter (Signed)
Spoke to pt's wife. Made lab visit for 07/18/18 and Web visit 07/22/18

## 2018-07-15 NOTE — Telephone Encounter (Signed)
Spoke to pt's wife.  1. The metformin is 3 a day but it was not changed in his med list so she selected the one that was available. We need to correct that and send in new rx.  2. She said that she purchased a Pulse Ox meter to check his heart rate. When he is not in the office, his HR has been 75 and 83. She has backed him down to 1 carvedilol a day because of that. She does not check his BP. Wanted to make sure that was okay to do with the med.  3. Said a web visit will be great. Please order the labs he will need to have done and we will get him scheduled for lab visit and Web Visit.

## 2018-07-15 NOTE — Telephone Encounter (Signed)
Metformin sent.  Would try to take carvedilol twice a day if tolerated.  O/w continue daily dosing if lightheaded or if pulse <60.  carvedilol sent.  Labs ordered, prior to web visit.  Thanks.

## 2018-07-18 ENCOUNTER — Other Ambulatory Visit: Payer: BC Managed Care – PPO

## 2018-07-22 ENCOUNTER — Other Ambulatory Visit (INDEPENDENT_AMBULATORY_CARE_PROVIDER_SITE_OTHER): Payer: BC Managed Care – PPO

## 2018-07-22 ENCOUNTER — Other Ambulatory Visit: Payer: Self-pay

## 2018-07-22 ENCOUNTER — Ambulatory Visit: Payer: BC Managed Care – PPO | Admitting: Family Medicine

## 2018-07-22 DIAGNOSIS — E1165 Type 2 diabetes mellitus with hyperglycemia: Secondary | ICD-10-CM | POA: Diagnosis not present

## 2018-07-22 DIAGNOSIS — E118 Type 2 diabetes mellitus with unspecified complications: Secondary | ICD-10-CM

## 2018-07-22 DIAGNOSIS — IMO0002 Reserved for concepts with insufficient information to code with codable children: Secondary | ICD-10-CM

## 2018-07-22 LAB — BASIC METABOLIC PANEL
BUN: 16 mg/dL (ref 6–23)
CO2: 30 mEq/L (ref 19–32)
Calcium: 9.2 mg/dL (ref 8.4–10.5)
Chloride: 102 mEq/L (ref 96–112)
Creatinine, Ser: 0.97 mg/dL (ref 0.40–1.50)
GFR: 98.65 mL/min (ref >60.00)
Glucose, Bld: 162 mg/dL — ABNORMAL HIGH (ref 70–99)
Potassium: 4.4 mEq/L (ref 3.5–5.1)
Sodium: 139 mEq/L (ref 135–145)

## 2018-07-22 LAB — HEMOGLOBIN A1C: Hgb A1c MFr Bld: 8.9 % — ABNORMAL HIGH (ref 4.6–6.5)

## 2018-07-25 ENCOUNTER — Ambulatory Visit (INDEPENDENT_AMBULATORY_CARE_PROVIDER_SITE_OTHER): Payer: BC Managed Care – PPO | Admitting: Family Medicine

## 2018-07-25 DIAGNOSIS — I5022 Chronic systolic (congestive) heart failure: Secondary | ICD-10-CM | POA: Diagnosis not present

## 2018-07-25 DIAGNOSIS — E118 Type 2 diabetes mellitus with unspecified complications: Secondary | ICD-10-CM | POA: Diagnosis not present

## 2018-07-25 DIAGNOSIS — IMO0002 Reserved for concepts with insufficient information to code with codable children: Secondary | ICD-10-CM

## 2018-07-25 DIAGNOSIS — E1165 Type 2 diabetes mellitus with hyperglycemia: Secondary | ICD-10-CM

## 2018-07-25 NOTE — Progress Notes (Signed)
Interactive audio and video telecommunications were attempted between this provider and patient, however failed, due to patient having technical difficulties OR patient did not have access to video capability.  We continued and completed visit with audio only.   Virtual Visit via Telephone Note   I connected with patient on 07/25/18 at 4:09 PM by telephone and verified that I am speaking with the correct person using two identifiers.  Location of patient: work  Location of MD: [place] Name of referring provider (if blank then none associated): Names per persons and role in encounter:  MD: Ferd Hibbs, Patient: name listed above.    I discussed the limitations, risks, security and privacy concerns of performing an evaluation and management service by telephone and the availability of in person appointments. I also discussed with the patient that there may be a patient responsible charge related to this service. The patient expressed understanding and agreed to proceed.  History of Present Illness:  Diabetes:  Using medications without difficulties: yes Hypoglycemic episodes:no Hyperglycemic episodes:no Feet problems:no Blood Sugars averaging: usually ~130-150 Lowest ~80, highest 220.  He feels better.   A1c better, down to 8.9.   He has been working on diet.   CHF.  He has been back on carvedilol, taking BID.  Advised to continue unless pulse <60.  Discussed diet and exercise.   Observations/Objective: nad Speaking in complete sentences.   Assessment and Plan: Diabetes.He feels better with improved glycemic control. A1c better, down to 8.9.   He has been working on diet.  No change in meds.  Continue as is and recheck in about 3 months.  CHF.  He is now back on carvedilol and ARB.  Continue as is for now.  He agrees.  No chest pain.  Not short of breath.  Follow Up Instructions: As above.  I discussed the assessment and treatment plan with the patient. The patient  was provided an opportunity to ask questions and all were answered. The patient agreed with the plan and demonstrated an understanding of the instructions.   The patient was advised to call back or seek an in-person evaluation if the symptoms worsen or if the condition fails to improve as anticipated.  I provided 12 minutes of non-face-to-face time during this encounter.  Crawford Givens, MD

## 2018-07-28 ENCOUNTER — Encounter: Payer: Self-pay | Admitting: Family Medicine

## 2018-07-28 NOTE — Assessment & Plan Note (Signed)
He is now back on carvedilol and ARB.  Continue as is for now.  He agrees.

## 2018-07-28 NOTE — Assessment & Plan Note (Signed)
He feels better with improved glycemic control. A1c better, down to 8.9.   He has been working on diet.  No change in meds.  Continue as is and recheck in about 3 months. His A1c may continue to improve with current interventions.

## 2018-10-25 ENCOUNTER — Encounter: Payer: Self-pay | Admitting: Family Medicine

## 2018-10-25 ENCOUNTER — Other Ambulatory Visit: Payer: Self-pay

## 2018-10-25 ENCOUNTER — Other Ambulatory Visit (INDEPENDENT_AMBULATORY_CARE_PROVIDER_SITE_OTHER): Payer: BC Managed Care – PPO | Admitting: Family Medicine

## 2018-10-25 ENCOUNTER — Ambulatory Visit: Payer: BC Managed Care – PPO | Admitting: Family Medicine

## 2018-10-25 DIAGNOSIS — E118 Type 2 diabetes mellitus with unspecified complications: Secondary | ICD-10-CM | POA: Diagnosis not present

## 2018-10-25 DIAGNOSIS — E1165 Type 2 diabetes mellitus with hyperglycemia: Secondary | ICD-10-CM | POA: Diagnosis not present

## 2018-10-25 DIAGNOSIS — IMO0002 Reserved for concepts with insufficient information to code with codable children: Secondary | ICD-10-CM

## 2018-10-25 LAB — POCT GLYCOSYLATED HEMOGLOBIN (HGB A1C): Hemoglobin A1C: 8.6 % — AB (ref 4.0–5.6)

## 2018-10-25 NOTE — Patient Instructions (Addendum)
Thanks for your effort.  Don't change your meds for now.  Update me as needed.  Recheck in about 3 months.   The only lab you need to have done for your next diabetic visit is an A1c.  We can do this with a fingerstick test at the office visit.  You do not need a lab visit ahead of time for this.  It does not matter if you are fasting when the lab is done.   Take care.  Glad to see you.

## 2018-10-25 NOTE — Progress Notes (Signed)
Diabetes:  Using medications without difficulties: yes Hypoglycemic episodes: cautions d/w pt.  Down to 84 at minimum Hyperglycemic episodes: 230 max, but not elevated likely prev Feet problems: no Blood Sugars averaging: see above.  eye exam within last year: f/u pending.   a1c 8.6.  D/w pt.   He has been walking in the evenings.    He is doing his own shots now with his insulin.  Meds, vitals, and allergies reviewed.  ROS: Per HPI unless specifically indicated in ROS section   GEN: nad, alert and oriented HEENT: ncat NECK: supple w/o LA CV: rrr. PULM: ctab, no inc wob ABD: soft, +bs EXT: trace BLE edema SKIN: no acute rash

## 2018-10-27 NOTE — Assessment & Plan Note (Signed)
His A1c is not at goal but has improved in the meantime.  Discussed with patient about med adherence Update me as needed.  Recheck in about 3 months.   No change in meds in the meantime.  Continue work on diet and exercise as that may continue to help with his sugar control. He has been walking at the mall several times a week and I thanked him for his effort.

## 2018-11-04 ENCOUNTER — Other Ambulatory Visit: Payer: Self-pay | Admitting: Family Medicine

## 2018-12-17 ENCOUNTER — Encounter: Payer: Self-pay | Admitting: Family Medicine

## 2018-12-18 ENCOUNTER — Other Ambulatory Visit: Payer: Self-pay | Admitting: Family Medicine

## 2018-12-18 MED ORDER — METFORMIN HCL 500 MG PO TABS: ORAL_TABLET | ORAL | 3 refills | Status: DC

## 2019-01-31 ENCOUNTER — Ambulatory Visit: Payer: BC Managed Care – PPO | Admitting: Family Medicine

## 2019-01-31 ENCOUNTER — Other Ambulatory Visit: Payer: Self-pay

## 2019-01-31 ENCOUNTER — Encounter: Payer: Self-pay | Admitting: Family Medicine

## 2019-01-31 VITALS — BP 170/104 | HR 82 | Temp 97.4°F | Ht 69.0 in | Wt 364.5 lb

## 2019-01-31 DIAGNOSIS — E1165 Type 2 diabetes mellitus with hyperglycemia: Secondary | ICD-10-CM | POA: Diagnosis not present

## 2019-01-31 DIAGNOSIS — IMO0002 Reserved for concepts with insufficient information to code with codable children: Secondary | ICD-10-CM

## 2019-01-31 DIAGNOSIS — I10 Essential (primary) hypertension: Secondary | ICD-10-CM | POA: Diagnosis not present

## 2019-01-31 DIAGNOSIS — E119 Type 2 diabetes mellitus without complications: Secondary | ICD-10-CM | POA: Diagnosis not present

## 2019-01-31 DIAGNOSIS — E118 Type 2 diabetes mellitus with unspecified complications: Secondary | ICD-10-CM | POA: Diagnosis not present

## 2019-01-31 LAB — POCT GLYCOSYLATED HEMOGLOBIN (HGB A1C): Hemoglobin A1C: 12.3 % — AB (ref 4.0–5.6)

## 2019-01-31 MED ORDER — LOSARTAN POTASSIUM 25 MG PO TABS: 50.0000 mg | ORAL_TABLET | Freq: Every day | ORAL | Status: DC

## 2019-01-31 NOTE — Progress Notes (Signed)
Diabetes:  Using medications without difficulties:yes, with occ missed doses of levemir.  Only using novolog with breakfast.   Hypoglycemic episodes: no Hyperglycemic episodes: likely based on A1c Feet problems: no Blood Sugars averaging: not checked often.  D/w pt.   eye exam within last year: he has f/u pending.  A1c up, d/w pt.    Hypertension:    Using medication without problems or lightheadedness: yes, but with occ missed doses.   Chest pain with exertion: no Edema:no Short of breath:no  His daughter was dx'd with lupus, she is in school at [employer].  She was inpatient for 1 week with sig fluid retention.  She is currently being followed as an outpatient.  Meds, vitals, and allergies reviewed.   ROS: Per HPI unless specifically indicated in ROS section   GEN: nad, alert and oriented HEENT: ncat NECK: supple w/o LA CV: rrr. PULM: ctab, no inc wob ABD: soft, +bs EXT: trace BLE edema SKIN: no acute rash  Diabetic foot exam: Normal inspection No skin breakdown No calluses  Normal DP pulses Normal sensation to light touch and monofilament Nails normal except for R 1st nail thickened.

## 2019-01-31 NOTE — Patient Instructions (Addendum)
Double your losartan and take 2 tabs a day.  You can take them at the same time.  Update me about your pressure in about 10 days.   You likely need the levemir more than the mealtime shot since most of your sugars are likely elevated.  Work on checking your sugar in the AM and if >150, then add 1 unit to the levemir dose.    Let me know about your sugar and levemir dose in about 10 days.   We'll go from there.   Plan on recheck at a physical in about 3 months.  Take care.  Glad to see you.  Thanks for getting a flu shot.

## 2019-02-02 NOTE — Assessment & Plan Note (Signed)
He can double his losartan to 50 mg a day and update me about his pressure in about 10 days.  We will go from there.  He agrees.

## 2019-02-02 NOTE — Assessment & Plan Note (Signed)
A1c up.  Discussed with patient at office visit He likely needs the levemir more than the mealtime adjustment since most of his sugars are likely elevated.  We tried to make the fewest number of changes possible.  He can work on checking sugar in the AM and if >150, then add 1 unit to the levemir dose.   He can update me in about 10 days.  Plan on recheck in 3 months.

## 2019-02-13 ENCOUNTER — Telehealth: Payer: Self-pay | Admitting: Family Medicine

## 2019-02-13 NOTE — Telephone Encounter (Signed)
Please get update on patient regarding Levemir dose, sugar readings, blood pressure.  Thanks.

## 2019-02-13 NOTE — Telephone Encounter (Addendum)
Left detailed message on voicemail to call in with information.

## 2019-03-20 ENCOUNTER — Encounter: Payer: Self-pay | Admitting: Family Medicine

## 2019-03-20 ENCOUNTER — Ambulatory Visit: Payer: BC Managed Care – PPO | Admitting: Family Medicine

## 2019-03-20 ENCOUNTER — Ambulatory Visit (INDEPENDENT_AMBULATORY_CARE_PROVIDER_SITE_OTHER)
Admission: RE | Admit: 2019-03-20 | Discharge: 2019-03-20 | Disposition: A | Discharge status: Home / Self Care | Payer: BC Managed Care – PPO | Source: Ambulatory Visit | Attending: Family Medicine | Admitting: Family Medicine

## 2019-03-20 ENCOUNTER — Other Ambulatory Visit: Payer: Self-pay

## 2019-03-20 VITALS — BP 152/98 | HR 94 | Temp 97.7°F | Ht 69.0 in | Wt 355.1 lb

## 2019-03-20 DIAGNOSIS — E1165 Type 2 diabetes mellitus with hyperglycemia: Secondary | ICD-10-CM | POA: Diagnosis not present

## 2019-03-20 DIAGNOSIS — E118 Type 2 diabetes mellitus with unspecified complications: Secondary | ICD-10-CM

## 2019-03-20 DIAGNOSIS — IMO0002 Reserved for concepts with insufficient information to code with codable children: Secondary | ICD-10-CM

## 2019-03-20 DIAGNOSIS — R109 Unspecified abdominal pain: Secondary | ICD-10-CM

## 2019-03-20 DIAGNOSIS — E119 Type 2 diabetes mellitus without complications: Secondary | ICD-10-CM | POA: Diagnosis not present

## 2019-03-20 LAB — POCT GLYCOSYLATED HEMOGLOBIN (HGB A1C): Hemoglobin A1C: 12.5 % — AB (ref 4.0–5.6)

## 2019-03-20 MED ORDER — NA SULFATE-K SULFATE-MG SULF 17.5-3.13-1.6 GM/177ML PO SOLN: 1.0000 | Freq: Once | ORAL | 0 refills | Status: AC

## 2019-03-20 NOTE — Patient Instructions (Signed)
Use the kit to get your bowels moving. If needed, take OTC miralax daily.  Update me in about 1 week about your sugars, sooner if needed about the constipation.  Take care.  Glad to see you.

## 2019-03-20 NOTE — Progress Notes (Signed)
This visit occurred during the SARS-CoV-2 public health emergency.  Safety protocols were in place, including screening questions prior to the visit, additional usage of staff PPE, and extensive cleaning of exam room while observing appropriate contact time as indicated for disinfecting solutions.   Diabetes:  Taking 3 metformin a day (2+1). Taking 25 units a day of levemir.  Taking sliding scale with breakfast.   He was only taking losartan 25mg  a day, not 50mg .   No recent lasix use.   He has been having R sided abd pain going on for about 1 month but more discomfort recently.  He had constipation.  Took mag citrate and "the flood gates opened."  Alka seltzer helped with some of the discomfort.  Not a sharp pain.  No blood in stool.  No fevers.  Brown stools.  Not SOB. Still with flatus.  prev with some nausea but no vomiting.    Meds, vitals, and allergies reviewed.   ROS: Per HPI unless specifically indicated in ROS section   GEN: nad, alert and oriented HEENT: ncat NECK: supple w/o LA CV: rrr. PULM: ctab, no inc wob ABD: soft, +bs, R abd mildly ttp but not ttp in RUQ or RUQ, L abd not ttp.  EXT: no edema SKIN: no acute rash

## 2019-03-23 DIAGNOSIS — R109 Unspecified abdominal pain: Secondary | ICD-10-CM | POA: Insufficient documentation

## 2019-03-23 NOTE — Assessment & Plan Note (Signed)
See notes on A1c.  Discussed with patient at office visit.

## 2019-03-23 NOTE — Assessment & Plan Note (Signed)
This could be due to constipation.  He had symptoms over the last month and I do not suspect an acute issue.  Still okay for outpatient follow-up.  He can try 1 dose of sodium sulfate/potassium sulfate/magnesium sulfate as that may be better tolerated than magnesium citrate.  See notes on KUB.  Discussed with patient at office visit.

## 2019-04-27 ENCOUNTER — Other Ambulatory Visit: Payer: Self-pay | Admitting: Family Medicine

## 2019-04-27 DIAGNOSIS — E119 Type 2 diabetes mellitus without complications: Secondary | ICD-10-CM

## 2019-05-01 ENCOUNTER — Telehealth: Payer: Self-pay

## 2019-05-01 NOTE — Telephone Encounter (Signed)
LVM w COVID screen, front door and back lab info 1.21.2021 TLJ  

## 2019-05-06 ENCOUNTER — Other Ambulatory Visit: Payer: Self-pay

## 2019-05-06 ENCOUNTER — Other Ambulatory Visit (INDEPENDENT_AMBULATORY_CARE_PROVIDER_SITE_OTHER): Payer: BC Managed Care – PPO

## 2019-05-06 DIAGNOSIS — E119 Type 2 diabetes mellitus without complications: Secondary | ICD-10-CM

## 2019-05-06 LAB — LIPID PANEL
Cholesterol: 231 mg/dL — ABNORMAL HIGH (ref 0–200)
HDL: 46.8 mg/dL (ref >39.00)
LDL Cholesterol: 147 mg/dL — ABNORMAL HIGH (ref 0–99)
NonHDL: 184.46
Total CHOL/HDL Ratio: 5
Triglycerides: 186 mg/dL — ABNORMAL HIGH (ref 0.0–149.0)
VLDL: 37.2 mg/dL (ref 0.0–40.0)

## 2019-05-06 LAB — COMPREHENSIVE METABOLIC PANEL
ALT: 19 U/L (ref 0–53)
AST: 17 U/L (ref 0–37)
Albumin: 3.7 g/dL (ref 3.5–5.2)
Alkaline Phosphatase: 92 U/L (ref 39–117)
BUN: 22 mg/dL (ref 6–23)
CO2: 31 mEq/L (ref 19–32)
Calcium: 9.3 mg/dL (ref 8.4–10.5)
Chloride: 96 mEq/L (ref 96–112)
Creatinine, Ser: 1.18 mg/dL (ref 0.40–1.50)
GFR: 78.44 mL/min (ref >60.00)
Glucose, Bld: 340 mg/dL — ABNORMAL HIGH (ref 70–99)
Potassium: 4.7 mEq/L (ref 3.5–5.1)
Sodium: 134 mEq/L — ABNORMAL LOW (ref 135–145)
Total Bilirubin: 0.4 mg/dL (ref 0.2–1.2)
Total Protein: 7.1 g/dL (ref 6.0–8.3)

## 2019-05-06 LAB — HEMOGLOBIN A1C: Hgb A1c MFr Bld: 13.2 % — ABNORMAL HIGH (ref 4.6–6.5)

## 2019-05-09 ENCOUNTER — Other Ambulatory Visit: Payer: Self-pay

## 2019-05-09 ENCOUNTER — Ambulatory Visit (INDEPENDENT_AMBULATORY_CARE_PROVIDER_SITE_OTHER): Payer: BC Managed Care – PPO | Admitting: Family Medicine

## 2019-05-09 ENCOUNTER — Encounter: Payer: Self-pay | Admitting: Family Medicine

## 2019-05-09 VITALS — BP 144/96 | HR 86 | Temp 97.6°F | Ht 69.0 in | Wt 359.4 lb

## 2019-05-09 DIAGNOSIS — L84 Corns and callosities: Secondary | ICD-10-CM

## 2019-05-09 DIAGNOSIS — I10 Essential (primary) hypertension: Secondary | ICD-10-CM

## 2019-05-09 DIAGNOSIS — E118 Type 2 diabetes mellitus with unspecified complications: Secondary | ICD-10-CM

## 2019-05-09 DIAGNOSIS — E1165 Type 2 diabetes mellitus with hyperglycemia: Secondary | ICD-10-CM | POA: Diagnosis not present

## 2019-05-09 DIAGNOSIS — IMO0002 Reserved for concepts with insufficient information to code with codable children: Secondary | ICD-10-CM

## 2019-05-09 MED ORDER — TRULICITY 0.75 MG/0.5ML ~~LOC~~ SOAJ: 0.7500 mg | SUBCUTANEOUS | 12 refills | Status: DC

## 2019-05-09 NOTE — Progress Notes (Signed)
This visit occurred during the SARS-CoV-2 public health emergency.  Safety protocols were in place, including screening questions prior to the visit, additional usage of staff PPE, and extensive cleaning of exam room while observing appropriate contact time as indicated for disinfecting solutions.  Defer CPE given his DM2 and other chronic medical illnesses, d/w pt.   Diabetes:  Using medications without difficulties: see below.   Hypoglycemic episodes: likely no, not checked Hyperglycemic episodes: see below, not checked.  Feet problems: no Blood Sugars averaging: not checked.  Taking metformin daily.  Taking insulin about once a week.   He is walking about 2-3 times a week.   He has been off his diet.  Hypertension:    Using medication without problems or lightheadedness: yes Chest pain with exertion:no Edema:no Short of breath:no Taking carvedilol and losartan. about 50% of the time.  Discussed.   His work schedule complicates his med adherence.   PMH and SH reviewed Meds, vitals, and allergies reviewed.   ROS: Per HPI unless specifically indicated in ROS section   GEN: nad, alert and oriented HEENT:ncat NECK: supple w/o LA CV: rrr. PULM: ctab, no inc wob ABD: soft, +bs EXT: no edema SKIN: no acute rash  Diabetic foot exam: Abnormal inspection, dry skin noted.  See below.   He has 2 cracked calluses on the left foot that do not show sign of active infection. Normal DP pulses Decrease sensation to light touch and monofilament Nails normal  See after visit summary.

## 2019-05-09 NOTE — Patient Instructions (Signed)
Price check trulicity.  If you can get that then start taking it once a week and let me know about your sugars.  We can make plans about your insulin at that point.   We'll call about seeing the podiatry clinic.  Plan on recheck A1c in 3 months at a visit but I want to hear about your sugar in the meantime.  Take care.  Glad to see you.

## 2019-05-12 NOTE — Assessment & Plan Note (Signed)
Foot care discussed with patient.  Refer to podiatry.  Needs better sugar control.  Discussed med adherence and his work schedule, which complicates the situation.  He was interested in Trulicity as he would be able to take that once a week.  He will price check that in the meantime and then update me about his situation and his sugar.  We will go from there.  Discussed diet and exercise.  He is walking for exercise.  At least 30 minutes were devoted to patient care in this encounter (this can potentially include time spent reviewing the patient's file/history, interviewing and examining the patient, counseling/reviewing plan with patient, ordering referrals, ordering tests, reviewing relevant laboratory or x-ray data, and documenting the encounter).

## 2019-05-12 NOTE — Assessment & Plan Note (Signed)
He is taking his carvedilol and losartan about 50% of the time, by his estimation.  Discussed trying to increase adherence to medication.

## 2019-06-15 ENCOUNTER — Encounter: Payer: Self-pay | Admitting: Family Medicine

## 2019-06-18 ENCOUNTER — Other Ambulatory Visit: Payer: Self-pay | Admitting: Family Medicine

## 2019-06-20 ENCOUNTER — Encounter: Payer: Self-pay | Admitting: Podiatry

## 2019-06-20 ENCOUNTER — Ambulatory Visit: Payer: BC Managed Care – PPO | Admitting: Podiatry

## 2019-06-20 ENCOUNTER — Other Ambulatory Visit: Payer: Self-pay

## 2019-06-20 VITALS — Temp 97.4°F

## 2019-06-20 DIAGNOSIS — B351 Tinea unguium: Secondary | ICD-10-CM

## 2019-06-20 DIAGNOSIS — E1165 Type 2 diabetes mellitus with hyperglycemia: Secondary | ICD-10-CM

## 2019-06-20 DIAGNOSIS — M2142 Flat foot [pes planus] (acquired), left foot: Secondary | ICD-10-CM

## 2019-06-20 DIAGNOSIS — M2141 Flat foot [pes planus] (acquired), right foot: Secondary | ICD-10-CM

## 2019-06-20 DIAGNOSIS — M79674 Pain in right toe(s): Secondary | ICD-10-CM

## 2019-06-20 DIAGNOSIS — E118 Type 2 diabetes mellitus with unspecified complications: Secondary | ICD-10-CM | POA: Diagnosis not present

## 2019-06-20 DIAGNOSIS — M79675 Pain in left toe(s): Secondary | ICD-10-CM

## 2019-06-20 DIAGNOSIS — IMO0002 Reserved for concepts with insufficient information to code with codable children: Secondary | ICD-10-CM

## 2019-06-24 ENCOUNTER — Encounter: Payer: Self-pay | Admitting: Podiatry

## 2019-06-24 NOTE — Progress Notes (Signed)
  Subjective:  Patient ID: Austin Shaw, male    DOB: 1965-06-24,  MRN: [id number]  Chief Complaint  Patient presents with  . 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 male returns for the above complaint.  Patient presents with mycotic toenails x10 that has been causing a lot of pain.  Patient states that they are painful to ambulate on.  He is a type II diabetic with last A1c of 12.8.  He denies any other acute complaint.  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  He would also like to discuss management and arch support with the shoes to help prevent any kind of future ulceration.  He denies any other acute complaints.  Objective:   Vitals:   06/20/19 1544  Temp: (!) 97.4 F (36.3 C)   Podiatric Exam: Vascular: dorsalis pedis and posterior tibial pulses are palpable bilateral. Capillary return is immediate. Temperature gradient is WNL. Skin turgor WNL  Sensorium: Normal Semmes Weinstein monofilament test. Normal tactile sensation bilaterally. Nail Exam: Pt has thick disfigured discolored nails with subungual debris noted bilateral entire nail hallux through fifth toenails Ulcer Exam: There is no evidence of ulcer or pre-ulcerative changes or infection. Orthopedic Exam: Muscle tone and strength are WNL. No limitations in general ROM. No crepitus or effusions noted. HAV  B/L.  Hammer toes 2-5  B/L.[name]  Semi-flexible pes planus deformity noted bilateral foot.  There is calcaneal eversion with too many toe sign partially to recreate the arch with dorsiflexion of the hallux. Skin: No Porokeratosis. No infection or ulcers  Assessment & Plan:  Patient was evaluated and treated and all questions answered.  Pes planus -I explained to the patient the etiology of pes planus deformity and various  treatment options associated with it.  Given that patient has deformity of the both feet I believe patient will benefit from orthotics management help support the arch of the foot as well as control the hindfoot motion.  He also tends to work in work boots with concrete surfaces so he will need orthotics to be able to withstand the pressure associated with it. -He will be scheduled to see Raiford Noble for custom-made orthotics.  Onychomycosis with pain  -Nails palliatively debrided as below. -Educated on self-care  Procedure: Nail Debridement Rationale: pain  Type of Debridement: manual, sharp debridement. Instrumentation: Nail nipper, rotary burr. Number of Nails: 10  Procedures and Treatment: Consent by patient was obtained for treatment procedures. The patient understood the discussion of treatment and procedures well. All questions were answered thoroughly reviewed. Debridement of mycotic and hypertrophic toenails, 1 through 5 bilateral and clearing of subungual debris. No ulceration, no infection noted.  Return Visit-Office Procedure: Patient instructed to return to the office for a follow up visit 3 months for continued evaluation and treatment.  Nicholes Rough, DPM    Return in about 1 week (around 06/27/2019) for Sched with Raiford Noble for [employer].

## 2019-07-10 ENCOUNTER — Ambulatory Visit (INDEPENDENT_AMBULATORY_CARE_PROVIDER_SITE_OTHER): Payer: BC Managed Care – PPO | Admitting: Orthotics

## 2019-07-10 ENCOUNTER — Other Ambulatory Visit: Payer: Self-pay

## 2019-07-10 DIAGNOSIS — E118 Type 2 diabetes mellitus with unspecified complications: Secondary | ICD-10-CM

## 2019-07-10 DIAGNOSIS — E1165 Type 2 diabetes mellitus with hyperglycemia: Secondary | ICD-10-CM

## 2019-07-10 DIAGNOSIS — IMO0002 Reserved for concepts with insufficient information to code with codable children: Secondary | ICD-10-CM

## 2019-07-10 DIAGNOSIS — M2142 Flat foot [pes planus] (acquired), left foot: Secondary | ICD-10-CM | POA: Diagnosis not present

## 2019-07-10 DIAGNOSIS — M79674 Pain in right toe(s): Secondary | ICD-10-CM

## 2019-07-10 DIAGNOSIS — M2141 Flat foot [pes planus] (acquired), right foot: Secondary | ICD-10-CM | POA: Diagnosis not present

## 2019-07-10 DIAGNOSIS — B351 Tinea unguium: Secondary | ICD-10-CM

## 2019-07-10 NOTE — Progress Notes (Signed)
Patient presents today with a hx of PTTD/AAF.  Upon assessment, patient has pronounced pes planus w/ a valgus RF deformity.  Patient has medially shifted talus/navicular.  Goal is provide longitudinal arch support and RF stability.  Plan on deep heel cup, hug arch, wide foot orthosis w/ medial flange and varus correction for RF valgus deformity.  Patient educated in the progessive nature of PTTD and financial responsibility.  

## 2019-07-31 ENCOUNTER — Other Ambulatory Visit: Payer: BC Managed Care – PPO | Admitting: Orthotics

## 2019-07-31 ENCOUNTER — Other Ambulatory Visit: Payer: Self-pay

## 2019-08-15 ENCOUNTER — Ambulatory Visit: Payer: BC Managed Care – PPO | Admitting: Family Medicine

## 2019-08-25 ENCOUNTER — Ambulatory Visit: Payer: BC Managed Care – PPO | Admitting: Family Medicine

## 2019-08-27 ENCOUNTER — Encounter: Payer: Self-pay | Admitting: Family Medicine

## 2019-08-27 LAB — HM DIABETES EYE EXAM

## 2019-08-29 ENCOUNTER — Other Ambulatory Visit: Payer: Self-pay

## 2019-08-29 ENCOUNTER — Ambulatory Visit: Payer: BC Managed Care – PPO | Admitting: Family Medicine

## 2019-08-29 ENCOUNTER — Encounter: Payer: Self-pay | Admitting: Family Medicine

## 2019-08-29 VITALS — BP 140/86 | HR 89 | Temp 97.0°F | Ht 69.0 in | Wt 365.4 lb

## 2019-08-29 DIAGNOSIS — E118 Type 2 diabetes mellitus with unspecified complications: Secondary | ICD-10-CM

## 2019-08-29 DIAGNOSIS — E1165 Type 2 diabetes mellitus with hyperglycemia: Secondary | ICD-10-CM

## 2019-08-29 DIAGNOSIS — IMO0002 Reserved for concepts with insufficient information to code with codable children: Secondary | ICD-10-CM

## 2019-08-29 LAB — POCT GLYCOSYLATED HEMOGLOBIN (HGB A1C): Hemoglobin A1C: 11.9 % — AB (ref 4.0–5.6)

## 2019-08-29 MED ORDER — INSULIN DETEMIR 100 UNIT/ML FLEXPEN: 5.0000 [IU] | PEN_INJECTOR | Freq: Every day | SUBCUTANEOUS | 11 refills | Status: DC

## 2019-08-29 MED ORDER — METFORMIN HCL 500 MG PO TABS: ORAL_TABLET | ORAL | Status: DC

## 2019-08-29 NOTE — Progress Notes (Signed)
This visit occurred during the SARS-CoV-2 public health emergency.  Safety protocols were in place, including screening questions prior to the visit, additional usage of staff PPE, and extensive cleaning of exam room while observing appropriate contact time as indicated for disinfecting solutions.  Diabetes:  Using medications without difficulties: see below.   Hypoglycemic episodes: no Hyperglycemic episodes: likely see, below Feet problems: better with inserts, he saw podiatry.   Blood Sugars averaging: 260-310 eye exam within last year: yes He was able to get trulicity but has been off it for the last few weeks.  He thought his eye pressure was lower off medication, but this is not entirely clear at the office visit and we need ophthalmology notes.  Discussed.  He is off insulin currently but he is willing to restart.  Rare use of lasix.    He is on losartan and carvediolol.    Meds, vitals, and allergies reviewed.   ROS: Per HPI unless specifically indicated in ROS section   GEN: nad, alert and oriented HEENT: ncat NECK: supple w/o LA CV: rrr. PULM: ctab, no inc wob ABD: soft, +bs EXT: no edema SKIN: Well-perfused.

## 2019-08-29 NOTE — Patient Instructions (Signed)
Let me check on options with trulicity with the eye clinic.  Restart levemir in the meantime.  Start with 5 units of insulin and increase by 1 unit per day until your AM sugar is around 150.  We'll be in touch.  Take care.  Glad to see you.

## 2019-08-31 ENCOUNTER — Telehealth: Payer: Self-pay | Admitting: Family Medicine

## 2019-08-31 NOTE — Assessment & Plan Note (Signed)
I need input from the eye clinic about Trulicity and his eye pressure.  See follow-up phone note.  In the meantime, restart Levemir, see after visit summary.  Continue Metformin.  He will hold Trulicity at this point.  See AVS.

## 2019-08-31 NOTE — Telephone Encounter (Signed)
Please call his eye clinic. [address], [address], [place], [place], [address] T [phone]  I need notes from the most recent office visit at the eye clinic.  I need to know if he had vision trouble related to increased intraocular pressure with Trulicity use.  Also I need to know if he had changes in visual acuity related to significant swings in sugar with Trulicity use.  Thanks.

## 2019-09-01 NOTE — Telephone Encounter (Signed)
Requested records.

## 2019-09-06 ENCOUNTER — Encounter: Payer: Self-pay | Admitting: Family Medicine

## 2019-09-09 ENCOUNTER — Other Ambulatory Visit: Payer: Self-pay | Admitting: *Deleted

## 2019-09-09 MED ORDER — FUROSEMIDE 40 MG PO TABS: 20.0000 mg | ORAL_TABLET | Freq: Every day | ORAL | 0 refills | Status: DC | PRN

## 2019-09-12 NOTE — Telephone Encounter (Signed)
Records received

## 2019-09-24 ENCOUNTER — Other Ambulatory Visit: Payer: Self-pay | Admitting: Family Medicine

## 2019-09-26 ENCOUNTER — Ambulatory Visit: Payer: BC Managed Care – PPO | Admitting: Podiatry

## 2019-10-10 ENCOUNTER — Ambulatory Visit: Payer: BC Managed Care – PPO | Admitting: Podiatry

## 2019-10-23 ENCOUNTER — Encounter: Payer: Self-pay | Admitting: Family Medicine

## 2019-10-24 ENCOUNTER — Ambulatory Visit: Payer: BC Managed Care – PPO | Admitting: Podiatry

## 2019-10-24 ENCOUNTER — Other Ambulatory Visit: Payer: Self-pay

## 2019-10-24 DIAGNOSIS — M79675 Pain in left toe(s): Secondary | ICD-10-CM

## 2019-10-24 DIAGNOSIS — B351 Tinea unguium: Secondary | ICD-10-CM

## 2019-10-24 DIAGNOSIS — L853 Xerosis cutis: Secondary | ICD-10-CM

## 2019-10-24 DIAGNOSIS — E118 Type 2 diabetes mellitus with unspecified complications: Secondary | ICD-10-CM

## 2019-10-24 DIAGNOSIS — M79674 Pain in right toe(s): Secondary | ICD-10-CM

## 2019-10-24 DIAGNOSIS — E1165 Type 2 diabetes mellitus with hyperglycemia: Secondary | ICD-10-CM

## 2019-10-24 DIAGNOSIS — IMO0002 Reserved for concepts with insufficient information to code with codable children: Secondary | ICD-10-CM

## 2019-10-28 ENCOUNTER — Encounter: Payer: Self-pay | Admitting: Podiatry

## 2019-10-28 NOTE — Progress Notes (Signed)
Subjective:  Patient ID: Austin Shaw, male    DOB: June 30, 1965,  MRN: [id number]  Chief Complaint  Patient presents with  . Nail Problem    thick painful toenails, 3 month follow up  . Callouses    painful callus lesion   54 y.o. male returns for the above complaint.  Patient presents with mycotic toenails x10 that has been causing a lot of pain.  Patient states that they are painful to ambulate on.  He is a type II diabetic with last A1c of 12.8.  He denies any other acute complaint.  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  Patient also has secondary complaint of bilateral xerosis/dry skin to both lower extremity.  Patient has tried various over-the-counter lotion sporadically.  He would like to know if there is any treatment options.  I encouraged him to use it twice a day.  Objective:   There were no vitals filed for this visit. Podiatric Exam: Vascular: dorsalis pedis and posterior tibial pulses are palpable bilateral. Capillary return is immediate. Temperature gradient is WNL. Skin turgor WNL  Sensorium: Normal Semmes Weinstein monofilament test. Normal tactile sensation bilaterally. Nail Exam: Pt has thick disfigured discolored nails with subungual debris noted bilateral entire nail hallux through fifth toenails Ulcer Exam: There is no evidence of ulcer or pre-ulcerative changes or infection. Orthopedic Exam: Muscle tone and strength are WNL. No limitations in general ROM. No crepitus or effusions noted. HAV  B/L.  Hammer toes 2-5  B/L.[name]  Semi-flexible pes planus deformity noted bilateral foot.  There is calcaneal eversion with too many toe sign partially to recreate the arch with dorsiflexion of the hallux. Skin: No Porokeratosis. No infection or ulcers.  Xerosis/dry skin noted to bilateral lower extremity  Assessment & Plan:  Patient was evaluated and treated and all questions answered.  Pes planus -I explained to the patient the  etiology of pes planus deformity and various treatment options associated with it.  Given that patient has deformity of the both feet I believe patient will benefit from orthotics management help support the arch of the foot as well as control the hindfoot motion.  He also tends to work in work boots with concrete surfaces so he will need orthotics to be able to withstand the pressure associated with it. -He will be scheduled to see Raiford Noble for custom-made orthotics.  Xerosis bilateral lower extremity -I explained to the patient the etiology of xerosis and various treatment options were extensively discussed.  I explained to the patient the importance of maintaining moisturization of the skin with application of over-the-counter lotion such as Eucerin or Luciderm.  I have asked the patient to apply this twice a day.  If unable to resolve patient will benefit from prescription lotion.   Onychomycosis with pain  -Nails palliatively debrided as below. -Educated on self-care  Procedure: Nail Debridement Rationale: pain  Type of Debridement: manual, sharp debridement. Instrumentation: Nail nipper, rotary burr. Number of Nails: 10  Procedures and Treatment: Consent by patient was obtained for treatment procedures. The patient understood the discussion of treatment and procedures well. All questions were answered thoroughly reviewed. Debridement of mycotic and hypertrophic toenails, 1 through 5 bilateral and clearing of subungual debris. No ulceration, no infection noted.  Return Visit-Office Procedure: Patient instructed to return to the office for a follow up visit 3 months for continued evaluation and treatment.  Nicholes Rough, DPM    Return in about 3 months (around  01/24/2020) for nail and callus trim.

## 2019-11-11 LAB — HM DIABETES EYE EXAM

## 2019-11-21 ENCOUNTER — Telehealth: Payer: Self-pay | Admitting: Family Medicine

## 2019-11-21 ENCOUNTER — Encounter: Payer: Self-pay | Admitting: Family Medicine

## 2019-11-21 NOTE — Telephone Encounter (Signed)
Patient is vaccinated for covid.  Reported positive test.   Please triage patient and see about referral to infusion program.   Thanks.

## 2019-11-21 NOTE — Telephone Encounter (Signed)
I spoke with pt; pt does not feel bad; pt is taking emergen C, alka seltzer, tylenol,and nyquil; pt thinks if he was not taking these meds he would have a fever; prod cough with yellow phlegm, no SOB, CP, abd pain or diarrhea. Pt cannot taste anything. No S/T or runny nose,no fatigue or no vomiting. No other covid symptoms; pt had exposure to + covid person end of last week. Pt went to church on Sunday and was around 3 people with + covid. Pt has CHF and diabetes and thinks having covid vaccine has kept him from feeling so bad. Pt said if he had to go to work and do an eight hr shift he could do it. Pt is on self quarantine; drinking plenty of fluids, trying to rest and will take tylenol if needed. Sending to Dr Para March.

## 2019-11-24 ENCOUNTER — Telehealth: Payer: Self-pay | Admitting: Physician Assistant

## 2019-11-24 ENCOUNTER — Telehealth: Payer: Self-pay | Admitting: Family Medicine

## 2019-11-24 ENCOUNTER — Telehealth: Payer: Self-pay | Admitting: *Deleted

## 2019-11-24 ENCOUNTER — Other Ambulatory Visit: Payer: Self-pay | Admitting: Physician Assistant

## 2019-11-24 DIAGNOSIS — I10 Essential (primary) hypertension: Secondary | ICD-10-CM

## 2019-11-24 DIAGNOSIS — I5022 Chronic systolic (congestive) heart failure: Secondary | ICD-10-CM

## 2019-11-24 DIAGNOSIS — IMO0002 Reserved for concepts with insufficient information to code with codable children: Secondary | ICD-10-CM

## 2019-11-24 DIAGNOSIS — U071 COVID-19: Secondary | ICD-10-CM

## 2019-11-24 DIAGNOSIS — E1165 Type 2 diabetes mellitus with hyperglycemia: Secondary | ICD-10-CM

## 2019-11-24 NOTE — Progress Notes (Signed)
I connected by phone with Austin Shaw on 11/24/2019 at 1:58 PM to discuss the potential use of a new treatment for mild to moderate COVID-19 viral infection in non-hospitalized patients.  This patient is a 54 y.o. male that meets the FDA criteria for Emergency Use Authorization of COVID monoclonal antibody casirivimab/imdevimab.  Has a (+) direct SARS-CoV-2 viral test result  Has mild or moderate COVID-19   Is NOT hospitalized due to COVID-19  Is within 10 days of symptom onset  Has at least one of the high risk factor(s) for progression to severe COVID-19 and/or hospitalization as defined in EUA.  Specific high risk criteria : BMI > 25, HTN, DMT2.    I have spoken and communicated the following to the patient or parent/caregiver regarding COVID monoclonal antibody treatment:  1. FDA has authorized the emergency use for the treatment of mild to moderate COVID-19 in adults and pediatric patients with positive results of direct SARS-CoV-2 viral testing who are 28 years of age and older weighing at least 40 kg, and who are at high risk for progressing to severe COVID-19 and/or hospitalization.  2. The significant known and potential risks and benefits of COVID monoclonal antibody, and the extent to which such potential risks and benefits are unknown.  3. Information on available alternative treatments and the risks and benefits of those alternatives, including clinical trials.  4. Patients treated with COVID monoclonal antibody should continue to self-isolate and use infection control measures (e.g., wear mask, isolate, social distance, avoid sharing personal items, clean and disinfect "high touch" surfaces, and frequent handwashing) according to CDC guidelines.   5. The patient or parent/caregiver has the option to accept or refuse COVID monoclonal antibody treatment.  After reviewing this information with the patient, The patient agreed to proceed with receiving casirivimab\imdevimab  infusion and will be provided a copy of the Fact sheet prior to receiving the infusion.  Sx onset 8/10. Set up for infusion on 8/17 @ 12:30pm. Directions given to [facility]. Pt is aware that insurance will be charged an infusion fee.   Of note, pt is fully vaccinated with [employer]. I have added him to our breakthrough infusion list.   [name] 11/24/2019 1:58 PM

## 2019-11-24 NOTE — Telephone Encounter (Signed)
Called to discuss with patient about Covid symptoms and the use of bamlanivimab/etesevimab or casirivimab/imdevimab, a monoclonal antibody infusion for those with mild to moderate Covid symptoms and at a high risk of hospitalization.  Pt is qualified for this infusion at the [facility] infusion center due to; Specific high risk criteria : Diabetes, HTN.    Message left to call back our hotline [phone] and sent mychart message.   [name] PA-C  [facility]

## 2019-11-24 NOTE — Telephone Encounter (Signed)
I just got an update from the eye clinic (okay to continue trulicity) and I'll send it for scanning.  Thanks.

## 2019-11-24 NOTE — Telephone Encounter (Signed)
Patient advised.

## 2019-11-24 NOTE — Telephone Encounter (Signed)
[place],Dr.Miller's office, called.  They don't have a patient by this name and date of birth in the system.

## 2019-11-24 NOTE — Telephone Encounter (Signed)
On a report from [employer], Dr. Para March states that he does not see any report related to Trulicity and asks if the eye clinic is ok with Trulicity use of this patient.  A voicemail was left at this office a month or more ago but I don't know if a response was ever received.  Another message was left today.

## 2019-11-24 NOTE — Telephone Encounter (Signed)
Please check on patient.  I called the infusion center to give his contact information so that can call him.  he may not need that treatment but I wanted them to contact him.  Thanks.

## 2019-11-25 ENCOUNTER — Ambulatory Visit (HOSPITAL_COMMUNITY)
Admission: RE | Admit: 2019-11-25 | Discharge: 2019-11-25 | Disposition: A | Discharge status: Home / Self Care | Payer: BC Managed Care – PPO | Source: Ambulatory Visit | Attending: Pulmonary Disease | Admitting: Pulmonary Disease

## 2019-11-25 DIAGNOSIS — I5022 Chronic systolic (congestive) heart failure: Secondary | ICD-10-CM | POA: Diagnosis not present

## 2019-11-25 DIAGNOSIS — U071 COVID-19: Secondary | ICD-10-CM | POA: Insufficient documentation

## 2019-11-25 DIAGNOSIS — E1165 Type 2 diabetes mellitus with hyperglycemia: Secondary | ICD-10-CM | POA: Diagnosis not present

## 2019-11-25 DIAGNOSIS — I10 Essential (primary) hypertension: Secondary | ICD-10-CM

## 2019-11-25 DIAGNOSIS — IMO0002 Reserved for concepts with insufficient information to code with codable children: Secondary | ICD-10-CM

## 2019-11-25 DIAGNOSIS — I110 Hypertensive heart disease with heart failure: Secondary | ICD-10-CM | POA: Insufficient documentation

## 2019-11-25 MED ORDER — METHYLPREDNISOLONE SODIUM SUCC 125 MG IJ SOLR: 125.0000 mg | Freq: Once | INTRAMUSCULAR | Status: DC | PRN

## 2019-11-25 MED ORDER — SODIUM CHLORIDE 0.9 % IV SOLN: INTRAVENOUS | Status: DC | PRN

## 2019-11-25 MED ORDER — EPINEPHRINE 0.3 MG/0.3ML IJ SOAJ: 0.3000 mg | Freq: Once | INTRAMUSCULAR | Status: DC | PRN

## 2019-11-25 MED ORDER — SODIUM CHLORIDE 0.9 % IV SOLN
1200.0000 mg | Freq: Once | INTRAVENOUS | Status: AC
  Administered 2019-11-25: 1200 mg via INTRAVENOUS
  Filled 2019-11-25: qty 10

## 2019-11-25 MED ORDER — FAMOTIDINE IN NACL 20-0.9 MG/50ML-% IV SOLN: 20.0000 mg | Freq: Once | INTRAVENOUS | Status: DC | PRN

## 2019-11-25 MED ORDER — DIPHENHYDRAMINE HCL 50 MG/ML IJ SOLN: 50.0000 mg | Freq: Once | INTRAMUSCULAR | Status: DC | PRN

## 2019-11-25 MED ORDER — ALBUTEROL SULFATE HFA 108 (90 BASE) MCG/ACT IN AERS: 2.0000 | INHALATION_SPRAY | Freq: Once | RESPIRATORY_TRACT | Status: DC | PRN

## 2019-11-25 NOTE — Discharge Instructions (Signed)

## 2019-11-25 NOTE — Progress Notes (Signed)
  Diagnosis: COVID-19  Physician: Dr Shan Levans  Procedure: [facility]id Infusion Clinic Med: casirivimab\imdevimab infusion - Provided patient with casirivimab\imdevimab fact sheet for patients, parents and caregivers prior to infusion.  Complications: BP 176/96. Pt states he didn't take his BP medication today. Pt instructed to take BP med as soon as he gets home.   Discharge: Discharged home   Dustin Flock 11/25/2019

## 2019-11-25 NOTE — Progress Notes (Signed)
.   Diagnosis: COVID-19  Physician: Dr. Wright  Procedure: Covid Infusion Clinic Med: casirivimab\imdevimab infusion - Provided patient with casirivimab\imdevimab fact sheet for patients, parents and caregivers prior to infusion.  Complications: No immediate complications noted.  Discharge: Discharged home   Kyndel Egger L 11/25/2019   

## 2020-01-30 ENCOUNTER — Other Ambulatory Visit: Payer: Self-pay

## 2020-01-30 ENCOUNTER — Ambulatory Visit: Payer: BC Managed Care – PPO | Admitting: Podiatry

## 2020-01-30 DIAGNOSIS — M2141 Flat foot [pes planus] (acquired), right foot: Secondary | ICD-10-CM | POA: Diagnosis not present

## 2020-01-30 DIAGNOSIS — M79674 Pain in right toe(s): Secondary | ICD-10-CM

## 2020-01-30 DIAGNOSIS — E1165 Type 2 diabetes mellitus with hyperglycemia: Secondary | ICD-10-CM

## 2020-01-30 DIAGNOSIS — IMO0002 Reserved for concepts with insufficient information to code with codable children: Secondary | ICD-10-CM

## 2020-01-30 DIAGNOSIS — B351 Tinea unguium: Secondary | ICD-10-CM

## 2020-01-30 DIAGNOSIS — M2142 Flat foot [pes planus] (acquired), left foot: Secondary | ICD-10-CM | POA: Diagnosis not present

## 2020-02-03 ENCOUNTER — Telehealth: Payer: Self-pay | Admitting: Family Medicine

## 2020-02-03 ENCOUNTER — Encounter: Payer: Self-pay | Admitting: Podiatry

## 2020-02-03 NOTE — Progress Notes (Signed)
  Subjective:  Patient ID: Austin Shaw, male    DOB: 1965/05/05,  MRN: [id number]  Chief Complaint  Patient presents with  . routine foot care    nail/callous trim    54 y.o. male returns for the above complaint.  Patient presents with follow-up of mycotic toenails x10 that has been causing a lot of pain.  Patient would like to have them debrided on his knees and pain with ambulating.  Patient is type II diabetic with last A1c of 12.8.  He denies any other acute complaints.  He says the orthotics are functioning well and he would like to obtain more copies/pair of them.  Objective:   There were no vitals filed for this visit. Podiatric Exam: Vascular: dorsalis pedis and posterior tibial pulses are palpable bilateral. Capillary return is immediate. Temperature gradient is WNL. Skin turgor WNL  Sensorium: Normal Semmes Weinstein monofilament test. Normal tactile sensation bilaterally. Nail Exam: Pt has thick disfigured discolored nails with subungual debris noted bilateral entire nail hallux through fifth toenails Ulcer Exam: There is no evidence of ulcer or pre-ulcerative changes or infection. Orthopedic Exam: Muscle tone and strength are WNL. No limitations in general ROM. No crepitus or effusions noted. HAV  B/L.  Hammer toes 2-5  B/L.[name]  Semi-flexible pes planus deformity noted bilateral foot.  There is calcaneal eversion with too many toe sign partially to recreate the arch with dorsiflexion of the hallux. Skin: No Porokeratosis. No infection or ulcers.  None  Assessment & Plan:  Patient was evaluated and treated and all questions answered.  Pes planus -I explained to the patient the etiology of pes planus deformity and various treatment options associated with it.  Given that patient has deformity of the both feet I believe patient will benefit from orthotics management help support the arch of the foot as well as control the hindfoot motion.  He also tends to work in work boots with  concrete surfaces so he will need orthotics to be able to withstand the pressure associated with it. -He has picked up his orthotics seems to be functioning well.  Good correction of the foot noted. -He is also interested in obtaining more orthotics and would like to discuss financial cost for them.  I will have Roe Coombs reach out to him.  Xerosis bilateral lower extremity -I explained to the patient the etiology of xerosis and various treatment options were extensively discussed.  I explained to the patient the importance of maintaining moisturization of the skin with application of over-the-counter lotion such as Eucerin or Luciderm.  I have asked the patient to apply this twice a day.  If unable to resolve patient will benefit from prescription lotion.   Onychomycosis with pain  -Nails palliatively debrided as below. -Educated on self-care  Procedure: Nail Debridement Rationale: pain  Type of Debridement: manual, sharp debridement. Instrumentation: Nail nipper, rotary burr. Number of Nails: 10  Procedures and Treatment: Consent by patient was obtained for treatment procedures. The patient understood the discussion of treatment and procedures well. All questions were answered thoroughly reviewed. Debridement of mycotic and hypertrophic toenails, 1 through 5 bilateral and clearing of subungual debris. No ulceration, no infection noted.  Return Visit-Office Procedure: Patient instructed to return to the office for a follow up visit 3 months for continued evaluation and treatment.  Nicholes Rough, DPM    No follow-ups on file.

## 2020-02-03 NOTE — Telephone Encounter (Signed)
-----   Message from Lavonna Monarch sent at 02/03/2020  2:23 PM EDT ----- Regarding: FW: please contact patient about follow up. Will you please contact patient to schedule a follow up? ----- Message ----- From: Joaquim Nam, MD Sent: 02/01/2020   3:06 PM EDT To: Lavonna Monarch Subject: please contact patient about follow up.        Thanks.

## 2020-02-03 NOTE — Telephone Encounter (Signed)
Scheduled followup on NOV 5th 4:00 pm EM

## 2020-02-11 ENCOUNTER — Encounter: Payer: Self-pay | Admitting: Family Medicine

## 2020-02-13 ENCOUNTER — Other Ambulatory Visit: Payer: Self-pay

## 2020-02-13 ENCOUNTER — Ambulatory Visit: Payer: BC Managed Care – PPO | Admitting: Family Medicine

## 2020-02-13 ENCOUNTER — Encounter: Payer: Self-pay | Admitting: Family Medicine

## 2020-02-13 VITALS — BP 160/110 | HR 88 | Temp 97.0°F | Ht 69.0 in | Wt 368.4 lb

## 2020-02-13 DIAGNOSIS — I10 Essential (primary) hypertension: Secondary | ICD-10-CM | POA: Diagnosis not present

## 2020-02-13 DIAGNOSIS — E1165 Type 2 diabetes mellitus with hyperglycemia: Secondary | ICD-10-CM | POA: Diagnosis not present

## 2020-02-13 DIAGNOSIS — E118 Type 2 diabetes mellitus with unspecified complications: Secondary | ICD-10-CM

## 2020-02-13 DIAGNOSIS — IMO0002 Reserved for concepts with insufficient information to code with codable children: Secondary | ICD-10-CM

## 2020-02-13 LAB — POCT GLYCOSYLATED HEMOGLOBIN (HGB A1C): Hemoglobin A1C: 12 % — AB (ref 4.0–5.6)

## 2020-02-13 NOTE — Progress Notes (Signed)
This visit occurred during the SARS-CoV-2 public health emergency.  Safety protocols were in place, including screening questions prior to the visit, additional usage of staff PPE, and extensive cleaning of exam room while observing appropriate contact time as indicated for disinfecting solutions.  Diabetes:  Using medications without difficulties: see below.   Hypoglycemic episodes: Hyperglycemic episodes: Feet problems: some tingling at baseline.  eye exam within last year: yes A1c up to 12, d/w pt.  We talked about his meds.  Per eye clinic, it was okay to continue with trulicity.   He is off trulicity and insulin. Still on metformin.    Hypertension:    Using medication without problems or lightheadedness: Yes Chest pain with exertion: No Edema: prn use of lasix, with relief.  Used about 2x per month .  Short of breath: No Recheck BP 160/90, hasn't had his BP meds today.   He is covering a lot of hours at work, d/w pt.   Meds, vitals, and allergies reviewed.   ROS: Per HPI unless specifically indicated in ROS section   GEN: nad, alert and oriented HEENT: ncat NECK: supple w/o LA CV: rrr. PULM: ctab, no inc wob ABD: soft, +bs EXT: trace BLE edema SKIN: no acute rash

## 2020-02-13 NOTE — Patient Instructions (Addendum)
Add back trulicity once a week (Wednesday).  Okay to do at night.   Update me in about 2 weeks.   Check your BP a few times and let me know about that when we contact you.  Take care.  Glad to see you.

## 2020-02-15 ENCOUNTER — Other Ambulatory Visit: Payer: Self-pay | Admitting: Family Medicine

## 2020-02-15 NOTE — Assessment & Plan Note (Addendum)
He has been taking his blood pressure medications but not today.  He will restart his blood pressure medications and he can let us know about his blood pressure when we contact him.  See diabetes discussion.

## 2020-02-15 NOTE — Assessment & Plan Note (Addendum)
Discussed options.  He will restart Trulicity. We will likely have to increase his dose in 2 weeks if he tolerates the lower dose.  A1c discussed with patient.  He would like to avoid fingersticks for A1c's in the future.  We can get his following labs done with phlebotomy.

## 2020-02-16 NOTE — Telephone Encounter (Signed)
Pharmacy requests refill on: Losartan Potassium 25 mg   LAST REFILL: 01/31/2019 LAST OV: 02/13/2020 NEXT OV: Not Scheduled  PHARMACY: [facility] [phone] [facility]

## 2020-03-07 ENCOUNTER — Telehealth: Payer: Self-pay | Admitting: Family Medicine

## 2020-03-07 NOTE — Telephone Encounter (Signed)
Please get update on patient.  Did he tolerate restarting Trulicity?  Please let me know.  If so we may need to increase his dose.  Please let him know that it is okay to give Trulicity in the abdomen.  Please get an update on his blood pressure and let me know about that too.  Thanks.

## 2020-03-08 NOTE — Telephone Encounter (Signed)
Waiting on response regarding BP

## 2020-03-08 NOTE — Telephone Encounter (Signed)
Please let me know about his BP.  Thanks.

## 2020-03-08 NOTE — Telephone Encounter (Signed)
Pt stated that he feels good with the trulicity for right now. He said he will keep [place] undated when he take the next dose on Wed.

## 2020-05-05 ENCOUNTER — Other Ambulatory Visit: Payer: Self-pay

## 2020-05-05 ENCOUNTER — Ambulatory Visit (INDEPENDENT_AMBULATORY_CARE_PROVIDER_SITE_OTHER): Payer: BC Managed Care – PPO | Admitting: Podiatry

## 2020-05-05 DIAGNOSIS — E1165 Type 2 diabetes mellitus with hyperglycemia: Secondary | ICD-10-CM

## 2020-05-05 DIAGNOSIS — E118 Type 2 diabetes mellitus with unspecified complications: Secondary | ICD-10-CM | POA: Diagnosis not present

## 2020-05-05 DIAGNOSIS — IMO0002 Reserved for concepts with insufficient information to code with codable children: Secondary | ICD-10-CM

## 2020-05-05 DIAGNOSIS — M79675 Pain in left toe(s): Secondary | ICD-10-CM | POA: Diagnosis not present

## 2020-05-05 DIAGNOSIS — B351 Tinea unguium: Secondary | ICD-10-CM | POA: Diagnosis not present

## 2020-05-05 DIAGNOSIS — M79674 Pain in right toe(s): Secondary | ICD-10-CM

## 2020-05-06 ENCOUNTER — Encounter: Payer: Self-pay | Admitting: Podiatry

## 2020-05-06 NOTE — Progress Notes (Addendum)
  Subjective:  Patient ID: Austin Shaw, male    DOB: 09/03/1965,  MRN: [id number]  Chief Complaint  Patient presents with  . routine foot care    Nail trim   55 y.o. male returns for the above complaint.  Patient presents with follow-up of mycotic toenails x10 that has been causing a lot of pain.  Patient would like to have them debrided on his knees and pain with ambulating.  Patient is type II diabetic with last A1c of 12.8.  He denies any other acute complaints.   Objective:   There were no vitals filed for this visit. Podiatric Exam: Vascular: dorsalis pedis and posterior tibial pulses are palpable bilateral. Capillary return is immediate. Temperature gradient is WNL. Skin turgor WNL  Sensorium: Normal Semmes Weinstein monofilament test. Normal tactile sensation bilaterally. Nail Exam: Pt has thick disfigured discolored nails with subungual debris noted bilateral entire nail hallux through fifth toenails Ulcer Exam: There is no evidence of ulcer or pre-ulcerative changes or infection. Orthopedic Exam: Muscle tone and strength are WNL. No limitations in general ROM. No crepitus or effusions noted. HAV  B/L.  Hammer toes 2-5  B/L. Skin: No Porokeratosis. No infection or ulcers.  None  Assessment & Plan:  Patient was evaluated and treated and all questions answered.  Pes planus -I explained to the patient the etiology of pes planus deformity and various treatment options associated with it.  Given that patient has deformity of the both feet I believe patient will benefit from orthotics management help support the arch of the foot as well as control the hindfoot motion.  He also tends to work in work boots with concrete surfaces so he will need orthotics to be able to withstand the pressure associated with it. -He has picked up his orthotics seems to be functioning well.  Good correction of the foot noted.   Xerosis bilateral lower extremity -I explained to the patient the etiology  of xerosis and various treatment options were extensively discussed.  I explained to the patient the importance of maintaining moisturization of the skin with application of over-the-counter lotion such as Eucerin or Luciderm.  I have asked the patient to apply this twice a day.  If unable to resolve patient will benefit from prescription lotion.   Onychomycosis with pain  -Nails palliatively debrided as below. -Educated on self-care  Procedure: Nail Debridement Rationale: pain  Type of Debridement: manual, sharp debridement. Instrumentation: Nail nipper, rotary burr. Number of Nails: 10  Procedures and Treatment: Consent by patient was obtained for treatment procedures. The patient understood the discussion of treatment and procedures well. All questions were answered thoroughly reviewed. Debridement of mycotic and hypertrophic toenails, 1 through 5 bilateral and clearing of subungual debris. No ulceration, no infection noted.  Return Visit-Office Procedure: Patient instructed to return to the office for a follow up visit 3 months for continued evaluation and treatment.  Nicholes Rough, DPM    No follow-ups on file.

## 2020-05-07 ENCOUNTER — Ambulatory Visit: Payer: BC Managed Care – PPO | Admitting: Podiatry

## 2020-05-12 ENCOUNTER — Other Ambulatory Visit: Payer: Self-pay | Admitting: Family Medicine

## 2020-06-13 ENCOUNTER — Other Ambulatory Visit: Payer: Self-pay | Admitting: Family Medicine

## 2020-06-26 ENCOUNTER — Other Ambulatory Visit: Payer: Self-pay | Admitting: Podiatry

## 2020-06-26 MED ORDER — DOXYCYCLINE HYCLATE 100 MG PO TABS
100.0000 mg | ORAL_TABLET | Freq: Two times a day (BID) | ORAL | 0 refills | Status: DC
Start: 2020-06-26 — End: 2020-07-27

## 2020-06-29 ENCOUNTER — Ambulatory Visit (INDEPENDENT_AMBULATORY_CARE_PROVIDER_SITE_OTHER): Payer: BC Managed Care – PPO | Admitting: Podiatry

## 2020-06-29 ENCOUNTER — Ambulatory Visit (INDEPENDENT_AMBULATORY_CARE_PROVIDER_SITE_OTHER): Payer: BC Managed Care – PPO

## 2020-06-29 ENCOUNTER — Other Ambulatory Visit: Payer: Self-pay

## 2020-06-29 ENCOUNTER — Telehealth: Payer: Self-pay | Admitting: *Deleted

## 2020-06-29 ENCOUNTER — Encounter: Payer: Self-pay | Admitting: Podiatry

## 2020-06-29 DIAGNOSIS — E1165 Type 2 diabetes mellitus with hyperglycemia: Secondary | ICD-10-CM | POA: Diagnosis not present

## 2020-06-29 DIAGNOSIS — L97512 Non-pressure chronic ulcer of other part of right foot with fat layer exposed: Secondary | ICD-10-CM

## 2020-06-29 DIAGNOSIS — E118 Type 2 diabetes mellitus with unspecified complications: Secondary | ICD-10-CM

## 2020-06-29 DIAGNOSIS — IMO0002 Reserved for concepts with insufficient information to code with codable children: Secondary | ICD-10-CM

## 2020-06-29 DIAGNOSIS — L97513 Non-pressure chronic ulcer of other part of right foot with necrosis of muscle: Secondary | ICD-10-CM

## 2020-06-29 MED ORDER — DOXYCYCLINE HYCLATE 100 MG PO TABS
100.0000 mg | ORAL_TABLET | Freq: Two times a day (BID) | ORAL | 0 refills | Status: DC
Start: 2020-06-29 — End: 2020-07-08

## 2020-06-29 NOTE — Telephone Encounter (Signed)
As they checking out from Austin Shaw appointment, his wife asked, "Is it better for him to be admitted into the hospital and receive IV antiobiotics versus taking an antibiotic for 30 days?"  I told her I would inform Dr. Allena Katz and his nurse and someone would call her back with a response.

## 2020-06-30 ENCOUNTER — Telehealth: Payer: Self-pay | Admitting: Family Medicine

## 2020-06-30 NOTE — Telephone Encounter (Signed)
Please call patient about scheduling diabetic follow-up at the clinic.  We can do labs at the visit.  Thanks.

## 2020-06-30 NOTE — Telephone Encounter (Signed)
Please advise 

## 2020-06-30 NOTE — Progress Notes (Signed)
Subjective:  Patient ID: Austin Shaw, male    DOB: 13-Jun-1965,  MRN: [id number]  Chief Complaint  Patient presents with  . Foot Ulcer    Patient presents today for new ulcer right hallux x 5 days.  He denies pain and is taking Doxycycline now    55 y.o. male presents for wound care.  Patient presents with complaint of right hallux ulceration for last 5 days.  Patient states is has progressive gotten worse.  Patient reached out to me over the weekend since I was the on-call physician to be evaluated.  He states that he is taking doxycycline now which as prescribed.  He has been doing Betadine dressings daily.  He does not have any pain.  He wants to get it evaluated make sure is not getting worse.  He denies any other acute complaints.   Review of Systems: Negative except as noted in the HPI. Denies N/V/F/Ch.  Past Medical History:  Diagnosis Date  . Allergy   . Diabetes mellitus    type 2  . Hyperlipidemia   . Hypertension   . Morbid obesity (HCC)    BMI 50  . Sleep apnea     Current Outpatient Medications:  .  doxycycline (VIBRA-TABS) 100 MG tablet, Take 1 tablet (100 mg total) by mouth 2 (two) times daily., Disp: 60 tablet, Rfl: 0 .  Blood Glucose Monitoring Suppl (ONE TOUCH ULTRA SYSTEM KIT) w/Device KIT, Check blood sugar twice a day and as directed. Dx E11.8, Disp: 1 each, Rfl: 0 .  carvedilol (COREG) 3.125 MG tablet, TAKE 1 TABLET (3.125 MG TOTAL) BY MOUTH 2 (TWO) TIMES DAILY WITH A MEAL., Disp: 180 tablet, Rfl: 0 .  doxycycline (VIBRA-TABS) 100 MG tablet, Take 1 tablet (100 mg total) by mouth 2 (two) times daily., Disp: 20 tablet, Rfl: 0 .  furosemide (LASIX) 40 MG tablet, Take 0.5 tablets (20 mg total) by mouth daily as needed for fluid or edema., Disp: 90 tablet, Rfl: 0 .  glucose blood test strip, CHECK BLOOD SUGAR TWICE DAILY, Disp: 100 each, Rfl: 3 .  losartan (COZAAR) 25 MG tablet, TAKE 1 TABLET BY MOUTH EVERY DAY, Disp: 90 tablet, Rfl: 1 .  metFORMIN  (GLUCOPHAGE) 500 MG tablet, TAKE 1 TABLET BY MOUTH TWICE A DAY THEN INC TO 2 IN THE AM AND 1 IN THE PM, Disp: 270 tablet, Rfl: 3 .  tobramycin (TOBREX) 0.3 % ophthalmic solution, 1 drop daily., Disp: , Rfl:  .  TRULICITY 6.83 MH/9.6QI SOPN, INJECT 0.75 MG INTO THE SKIN ONCE A WEEK., Disp: 4 mL, Rfl: 12  Social History   Tobacco Use  Smoking Status Never Smoker  Smokeless Tobacco Never Used    No Known Allergies Objective:  There were no vitals filed for this visit. There is no height or weight on file to calculate BMI. Constitutional Well developed. Well nourished.  Vascular Dorsalis pedis pulses palpable bilaterally. Posterior tibial pulses palpable bilaterally. Capillary refill normal to all digits.  No cyanosis or clubbing noted. Pedal hair growth normal.  Neurologic Normal speech. Oriented to person, place, and time. Protective sensation absent  Dermatologic Wound Location: Right hallux ulceration down to the level of the deep tissue Wound Base: Mixed Granular/Fibrotic Peri-wound: Calloused Exudate: Scant/small amount Serous exudate Wound Measurements: -See below  Orthopedic: No pain to palpation either foot.   Radiographs: 3 views of skeletally mature the right foot: No osteomyelitic changes noted.  No soft tissue emphysema noted.  Soft tissue defect was appreciated in x-ray.  No concern for cortical destruction at this time. Assessment:   1. Ulcer of great toe, right, with necrosis of muscle ([place])   2. Diabetes mellitus type 2, uncontrolled, with complications ([place])    Plan:  Patient was evaluated and treated and all questions answered.  Ulcer right hallux ulceration down to the level of the deep tissue/fascia -Debridement as below. -Dressed with Betadine wet-to-dry, DSD. -Continue off-loading with surgical shoe. -Continue doxycycline for skin and soft tissue prophylaxis.  He will benefit from continuous doxycycline until resolve meant of the ulceration or possible  amputation. -Patient is a high risk of losing the digit if not proper granulation tissue is formed.  The wound is deep almost down to the bone.  If the wound continues to worsens I discussed with him that he could underwent an amputation.  Patient states understanding.  Procedure: Excisional Debridement of Wound Tool: Sharp chisel blade/tissue nipper Rationale: Removal of non-viable soft tissue from the wound to promote healing.  Anesthesia: none Pre-Debridement Wound Measurements: 1.5 cm x wound 1.2 cm x 0.4 cm  Post-Debridement Wound Measurements: 1.7 cm x 1.3 cm x 0.4 cm  Type of Debridement: Sharp Excisional Tissue Removed: Non-viable soft tissue Blood loss: Minimal (<50cc) Depth of Debridement: subcutaneous tissue. Technique: Sharp excisional debridement to bleeding, viable wound base.  Wound Progress: This is my initial evaluation.  I will continue to monitor the progression of the wound. Dressing: Dry, sterile, compression dressing. Disposition: Patient tolerated procedure well. Patient to return in 1 week for follow-up.  No follow-ups on file.

## 2020-06-30 NOTE — Telephone Encounter (Signed)
I called its done

## 2020-07-06 ENCOUNTER — Ambulatory Visit: Payer: BC Managed Care – PPO | Admitting: Podiatry

## 2020-07-06 ENCOUNTER — Other Ambulatory Visit: Payer: Self-pay

## 2020-07-06 ENCOUNTER — Encounter: Payer: Self-pay | Admitting: Podiatry

## 2020-07-06 DIAGNOSIS — L97513 Non-pressure chronic ulcer of other part of right foot with necrosis of muscle: Secondary | ICD-10-CM

## 2020-07-06 DIAGNOSIS — I739 Peripheral vascular disease, unspecified: Secondary | ICD-10-CM

## 2020-07-06 DIAGNOSIS — I999 Unspecified disorder of circulatory system: Secondary | ICD-10-CM

## 2020-07-07 ENCOUNTER — Encounter: Payer: Self-pay | Admitting: Podiatry

## 2020-07-07 NOTE — Progress Notes (Signed)
Subjective:  Patient ID: Austin Shaw, male    DOB: [birth year],  MRN: [id number]  Chief Complaint  Patient presents with  . Wound Check    "I think it looks the same"    55 y.o. male presents for wound care.  Patient presents for follow-up of right hallux ulceration probing down to deep tissue.  Patient states that he has been taking doxycycline.  He denies any other acute complaints.  Been doing regular dressing changes.   Review of Systems: Negative except as noted in the HPI. Denies N/V/F/Ch.  Past Medical History:  Diagnosis Date  . Allergy   . Diabetes mellitus    type 2  . Hyperlipidemia   . Hypertension   . Morbid obesity (HCC)    BMI 50  . Sleep apnea     Current Outpatient Medications:  .  Blood Glucose Monitoring Suppl (ONE TOUCH ULTRA SYSTEM KIT) w/Device KIT, Check blood sugar twice a day and as directed. Dx E11.8, Disp: 1 each, Rfl: 0 .  carvedilol (COREG) 3.125 MG tablet, TAKE 1 TABLET (3.125 MG TOTAL) BY MOUTH 2 (TWO) TIMES DAILY WITH A MEAL., Disp: 180 tablet, Rfl: 0 .  doxycycline (VIBRA-TABS) 100 MG tablet, Take 1 tablet (100 mg total) by mouth 2 (two) times daily., Disp: 20 tablet, Rfl: 0 .  doxycycline (VIBRA-TABS) 100 MG tablet, Take 1 tablet (100 mg total) by mouth 2 (two) times daily., Disp: 60 tablet, Rfl: 0 .  furosemide (LASIX) 40 MG tablet, Take 0.5 tablets (20 mg total) by mouth daily as needed for fluid or edema., Disp: 90 tablet, Rfl: 0 .  glucose blood test strip, CHECK BLOOD SUGAR TWICE DAILY, Disp: 100 each, Rfl: 3 .  losartan (COZAAR) 25 MG tablet, TAKE 1 TABLET BY MOUTH EVERY DAY, Disp: 90 tablet, Rfl: 1 .  metFORMIN (GLUCOPHAGE) 500 MG tablet, TAKE 1 TABLET BY MOUTH TWICE A DAY THEN INC TO 2 IN THE AM AND 1 IN THE PM, Disp: 270 tablet, Rfl: 3 .  tobramycin (TOBREX) 0.3 % ophthalmic solution, 1 drop daily., Disp: , Rfl:  .  TRULICITY 4.68 EH/2.1YY SOPN, INJECT 0.75 MG INTO THE SKIN ONCE A WEEK., Disp: 4 mL, Rfl: 12  Social History    Tobacco Use  Smoking Status Never Smoker  Smokeless Tobacco Never Used    No Known Allergies Objective:  There were no vitals filed for this visit. There is no height or weight on file to calculate BMI. Constitutional Well developed. Well nourished.  Vascular Dorsalis pedis pulses non palpable bilaterally. Posterior tibial pulses non palpable bilaterally. Capillary refill normal to all digits.  No cyanosis or clubbing noted. Pedal hair growth normal.  Neurologic Normal speech. Oriented to person, place, and time. Protective sensation absent  Dermatologic Wound Location: Right hallux ulceration down to the level of the deep tissue Wound Base: Mixed Granular/Fibrotic Peri-wound: Calloused Exudate: Scant/small amount Serous exudate Wound Measurements: -See below  Orthopedic: No pain to palpation either foot.   Radiographs: 3 views of skeletally mature the right foot: No osteomyelitic changes noted.  No soft tissue emphysema noted.  Soft tissue defect was appreciated in x-ray.  No concern for cortical destruction at this time. Assessment:   1. Ulcer of great toe, right, with necrosis of muscle ([place])   2. PVD (peripheral vascular disease) (HCC)   3. Vascular abnormality    Plan:  Patient was evaluated and treated and all questions answered.  Ulcer right hallux ulceration down to the level of the deep  tissue/fascia -Debridement as below. -Dressed with Betadine wet-to-dry, DSD. -Continue off-loading with surgical shoe. -Continue doxycycline for skin and soft tissue prophylaxis.  He will benefit from continuous doxycycline until resolve meant of the ulceration or possible amputation. -Patient is a high risk of losing the digit if not proper granulation tissue is formed.  The wound is deep almost down to the bone.  If the wound continues to worsens I discussed with him that he could underwent an amputation.  Patient states understanding. -ABIs PVRs were ordered to assess the  vascular flow to the right lower extremity  Procedure: Excisional Debridement of Wound~stagnant Tool: Sharp chisel blade/tissue nipper Rationale: Removal of non-viable soft tissue from the wound to promote healing.  Anesthesia: none Pre-Debridement Wound Measurements: 1.5 cm x wound 1.2 cm x 0.4 cm  Post-Debridement Wound Measurements: 1.7 cm x 1.3 cm x 0.4 cm  Type of Debridement: Sharp Excisional Tissue Removed: Non-viable soft tissue Blood loss: Minimal (<50cc) Depth of Debridement: subcutaneous tissue. Technique: Sharp excisional debridement to bleeding, viable wound base.  Wound Progress: The wound is same as the previous evaluation. Dressing: Dry, sterile, compression dressing. Disposition: Patient tolerated procedure well. Patient to return in 1 week for follow-up.  No follow-ups on file.

## 2020-07-07 NOTE — Telephone Encounter (Signed)
Called pat and he is scheduled for 4/8 @ 4 pm

## 2020-07-07 NOTE — Telephone Encounter (Signed)
See below.  This isn't a TOC.  Thanks.

## 2020-07-08 ENCOUNTER — Ambulatory Visit (INDEPENDENT_AMBULATORY_CARE_PROVIDER_SITE_OTHER): Payer: BC Managed Care – PPO | Admitting: Cardiovascular Disease

## 2020-07-08 ENCOUNTER — Ambulatory Visit (INDEPENDENT_AMBULATORY_CARE_PROVIDER_SITE_OTHER): Payer: BC Managed Care – PPO

## 2020-07-08 ENCOUNTER — Encounter: Payer: Self-pay | Admitting: Cardiovascular Disease

## 2020-07-08 ENCOUNTER — Other Ambulatory Visit: Payer: Self-pay

## 2020-07-08 VITALS — BP 124/74 | HR 92 | Ht 69.0 in | Wt 348.0 lb

## 2020-07-08 DIAGNOSIS — L97513 Non-pressure chronic ulcer of other part of right foot with necrosis of muscle: Secondary | ICD-10-CM

## 2020-07-08 DIAGNOSIS — E785 Hyperlipidemia, unspecified: Secondary | ICD-10-CM

## 2020-07-08 DIAGNOSIS — IMO0002 Reserved for concepts with insufficient information to code with codable children: Secondary | ICD-10-CM

## 2020-07-08 DIAGNOSIS — E1165 Type 2 diabetes mellitus with hyperglycemia: Secondary | ICD-10-CM

## 2020-07-08 DIAGNOSIS — I70229 Atherosclerosis of native arteries of extremities with rest pain, unspecified extremity: Secondary | ICD-10-CM | POA: Diagnosis not present

## 2020-07-08 DIAGNOSIS — I5022 Chronic systolic (congestive) heart failure: Secondary | ICD-10-CM

## 2020-07-08 DIAGNOSIS — E118 Type 2 diabetes mellitus with unspecified complications: Secondary | ICD-10-CM

## 2020-07-08 DIAGNOSIS — I739 Peripheral vascular disease, unspecified: Secondary | ICD-10-CM

## 2020-07-08 MED ORDER — ASPIRIN EC 81 MG PO TBEC
81.0000 mg | DELAYED_RELEASE_TABLET | Freq: Every day | ORAL | Status: DC
Start: 2020-07-08 — End: 2020-10-04

## 2020-07-08 MED ORDER — ATORVASTATIN CALCIUM 40 MG PO TABS
40.0000 mg | ORAL_TABLET | Freq: Every day | ORAL | 5 refills | Status: DC
Start: 2020-07-08 — End: 2020-07-16

## 2020-07-08 NOTE — Progress Notes (Signed)
  Cardiology Office Note   Date:  07/08/2020   ID:  Austin Shaw, DOB 08/14/1965, MRN [id number]  PCP:  Duncan, Graham S, MD  Cardiologist:   Engelbert Sevin, MD .  Previously followed by Dr. Brackbill and Lori Gearhart but has not been seen since 2017.  Chief Complaint  Patient presents with  . PV add on    PV testing today      History of Present Illness: Austin Shaw is a 55 y.o. male who was referred by Dr. Patel for evaluation and management of peripheral arterial disease. He was previously followed in our group but has not been seen since 2017.  He has history of chronic systolic heart failure with previous ejection fraction of 20 to 25% in 2016.  He was treated medically and subsequently had improvement in his ejection fraction.  He has multiple other medical issues including type 2 diabetes, essential hypertension, hyperlipidemia, sleep apnea and morbid obesity. He works at [employer] and wears tight boots.  He developed an ulceration on the right big toe that progressively got bigger and deeper.  He was treated with antibiotics and wound care by Dr. Patel but there has been no improvement.  He denies any discomfort or calf claudication.  He underwent noninvasive vascular studies today and we could not detect any blood flow in the right big toe with moderately reduced ABI.  Interestingly, duplex showed no obstructive disease up to the level of the ankle but then there was no flow distal to that. He denies chest pain or shortness of breath.  He has prolonged history of diabetes but no tobacco use.  Past Medical History:  Diagnosis Date  . Allergy   . Diabetes mellitus    type 2  . Hyperlipidemia   . Hypertension   . Morbid obesity (HCC)    BMI 50  . Sleep apnea     Past Surgical History:  Procedure Laterality Date  . TONSILLECTOMY       Current Outpatient Medications  Medication Sig Dispense Refill  . aspirin EC 81 MG tablet Take 1 tablet (81  mg total) by mouth daily. Swallow whole.    . Blood Glucose Monitoring Suppl (ONE TOUCH ULTRA SYSTEM KIT) w/Device KIT Check blood sugar twice a day and as directed. Dx E11.8 1 each 0  . carvedilol (COREG) 3.125 MG tablet TAKE 1 TABLET (3.125 MG TOTAL) BY MOUTH 2 (TWO) TIMES DAILY WITH A MEAL. 180 tablet 0  . doxycycline (VIBRA-TABS) 100 MG tablet Take 1 tablet (100 mg total) by mouth 2 (two) times daily. 20 tablet 0  . furosemide (LASIX) 40 MG tablet Take 0.5 tablets (20 mg total) by mouth daily as needed for fluid or edema. 90 tablet 0  . glucose blood test strip CHECK BLOOD SUGAR TWICE DAILY 100 each 3  . losartan (COZAAR) 25 MG tablet TAKE 1 TABLET BY MOUTH EVERY DAY 90 tablet 1  . metFORMIN (GLUCOPHAGE) 500 MG tablet Take 2 tablets in the AM and 1 tablet int he evening    . tobramycin (TOBREX) 0.3 % ophthalmic solution 1 drop daily.    . TRULICITY 0.75 MG/0.5ML SOPN INJECT 0.75 MG INTO THE SKIN ONCE A WEEK. 4 mL 12   No current facility-administered medications for this visit.    Allergies:   Patient has no known allergies.    Social History:  The patient  reports that he has never smoked. He has never used smokeless tobacco. He reports that he   does not drink alcohol and does not use drugs.   Family History:  The patient's family history includes Congestive Heart Failure in an other family member; Diabetes in his father and another family member; Lupus in his daughter; Sjogren's syndrome in his daughter.    ROS:  Please see the history of present illness.   Otherwise, review of systems are positive for none.   All other systems are reviewed and negative.    PHYSICAL EXAM: VS:  BP 124/74 (BP Location: Left Arm, Patient Position: Sitting, Cuff Size: Large)   Pulse 92   Ht 5' 9" (1.753 m)   Wt (!) 348 lb (157.9 kg)   SpO2 98%   BMI 51.39 kg/m  , BMI Body mass index is 51.39 kg/m. GEN: Well nourished, well developed, in no acute distress  HEENT: normal  Neck: no JVD, carotid  bruits, or masses Cardiac: RRR; no murmurs, rubs, or gallops,no edema  Respiratory:  clear to auscultation bilaterally, normal work of breathing GI: soft, nontender, nondistended, + BS MS: no deformity or atrophy  Skin: warm and dry, no rash Neuro:  Strength and sensation are intact Psych: euthymic mood, full affect Vascular: Distal pulses are not palpable.  There is a large deep ulceration on the bottom of the right big toe.  EKG:  EKG is ordered today. The ekg ordered today demonstrates normal sinus rhythm with LVH.  No significant ST or T wave changes.   Recent Labs: No results found for requested labs within last 8760 hours.    Lipid Panel    Component Value Date/Time   CHOL 231 (H) 05/06/2019 0751   TRIG 186.0 (H) 05/06/2019 0751   HDL 46.80 05/06/2019 0751   CHOLHDL 5 05/06/2019 0751   VLDL 37.2 05/06/2019 0751   LDLCALC 147 (H) 05/06/2019 0751   LDLDIRECT 155.4 01/19/2011 0908      Wt Readings from Last 3 Encounters:  07/08/20 (!) 348 lb (157.9 kg)  02/13/20 (!) 368 lb 6 oz (167.1 kg)  08/29/19 (!) 365 lb 7 oz (165.8 kg)      No flowsheet data found.    ASSESSMENT AND PLAN:  1.  Peripheral arterial disease with critical limb ischemia: The patient has nonhealing ulceration on the right big toe.  He is at high risk for limb loss and amputation.  Noninvasive vascular study showed no detectable flow in the right big toe even though he does not seem to have significant disease in the main arteries.  I suspect that he has significant tibial disease from the ankle to the toes.  This is a limb threatening situation.  I have recommended proceeding with abdominal aortogram with lower extremity runoff and possible endovascular intervention.  We will proceed with the procedure next week.  I added aspirin 81 mg once daily.  2.  Essential hypertension: Blood pressure is controlled.  3.  Hyperlipidemia: Most recent lipid profile January 2021 showed an LDL of 147.  Given that  he is diabetic and now has peripheral arterial disease, I added atorvastatin 40 mg once daily.  Recommend a target LDL of less than 70.  4.  History of chronic systolic heart failure: No recent symptoms and most recent ejection fraction was normal.    Disposition:   FU with me in 1 month  Signed,  Jaramie Bastos, MD  07/08/2020 10:13 AM    [facility] 

## 2020-07-08 NOTE — Patient Instructions (Signed)
Medication Instructions:  Your physician has recommended you make the following change in your medication:   START Aspirin 81 daily. This can be purchased over the counter.  START Atorvastatin (lipitor) 40 mg daily. An Rx has been sent to your pharmacy.   *If you need a refill on your cardiac medications before your next appointment, please call your pharmacy*   Lab Work: BMP and CBC today If you have labs (blood work) drawn today and your tests are completely normal, you will receive your results only by: Austin Shaw MyChart Message (if you have MyChart) OR . A paper copy in the mail If you have any lab test that is abnormal or we need to change your treatment, we will call you to review the results.   Testing/Procedures: You are being scheduled for a Abdominal Aortogram with Dr. Kirke Corin. Please see below for pre-procedure instructions.   Follow-Up: At [facility], you and your health needs are our priority.  As part of our continuing mission to provide you with exceptional heart care, we have created designated Provider Care Teams.  These Care Teams include your primary Cardiologist (physician) and Advanced Practice Providers (APPs -  Physician Assistants and Nurse Practitioners) who all work together to provide you with the care you need, when you need it.  We recommend signing up for the patient portal called "MyChart".  Sign up information is provided on this After Visit Summary.  MyChart is used to connect with patients for Virtual Visits (Telemedicine).  Patients are able to view lab/test results, encounter notes, upcoming appointments, etc.  Non-urgent messages can be sent to your provider as well.   To learn more about what you can do with MyChart, go to [email or web address].    Your next appointment:   3 week(s)  The format for your next appointment:   In Person  Provider:   You may see  Lorine Bears, MD or one of the following Advanced Practice Providers on your designated  Care Team:    Nicolasa Ducking, NP  Eula Listen, PA-C  Marisue Ivan, PA-C  Cadence Fransico Michael, [place]  Gillian Shields, NP    Other Instructions    [facility] CARDIOVASCULAR DIVISION [facility] [address] [place] [address] Dept: [phone] Loc: [phone]  Naeem Quillin  07/08/2020  You are scheduled for a Peripheral Angiogram on Wednesday, April 6 with Dr. Lorine Bears.  1. Please arrive at the [facility] (Main Entrance A) at [facility]: [address] [place], [place] [address] at 8:30 AM (This time is two hours before your procedure to ensure your preparation). Free valet parking service is available.   Special note: Every effort is made to have your procedure done on time. Please understand that emergencies sometimes delay scheduled procedures.  2. Diet: Do not eat solid foods after midnight.  The patient may have clear liquids until 5am upon the day of the procedure.  3. Labs: You will need to have blood drawn today (Bmp, Cbc).  --You will need to have a COVID test on Mon 07/12/20. Please report to the [facility] medical arts building between 8am-1pm. Please park in a designated parking space. Enter the building and it is the first office on the right.  4. Medication instructions in preparation for your procedure:   Contrast Allergy: No  HOLD Furosemide (Lasix) the morning of the procedure.  Do not take Diabetes Med Glucophage (Metformin) on the day of the procedure and HOLD 48  HOURS AFTER THE PROCEDURE.  On the morning of your procedure, take your Aspirin and any morning medicines NOT listed above.  You may use sips of water.  5. Plan for one night stay--bring personal belongings. 6. Bring a current list of your medications and current insurance cards. 7. You MUST have a responsible person to drive you home. 8. Someone MUST be with you the first 24 hours after you arrive home or your  discharge will be delayed. 9. Please wear clothes that are easy to get on and off and wear slip-on shoes.  Thank you for allowing [place] to care for you!   -- [place] [facility]

## 2020-07-08 NOTE — H&P (View-Only) (Signed)
  Cardiology Office Note   Date:  07/08/2020   ID:  Austin Shaw, DOB 08/13/1965, MRN [id number]  PCP:  Duncan, Graham S, MD  Cardiologist:   Muhammad Arida, MD .  Previously followed by Dr. Brackbill and Lori Gearhart but has not been seen since 2017.  Chief Complaint  Patient presents with  . PV add on    PV testing today      History of Present Illness: Austin Shaw is a 55 y.o. male who was referred by Dr. Patel for evaluation and management of peripheral arterial disease. He was previously followed in our group but has not been seen since 2017.  He has history of chronic systolic heart failure with previous ejection fraction of 20 to 25% in 2016.  He was treated medically and subsequently had improvement in his ejection fraction.  He has multiple other medical issues including type 2 diabetes, essential hypertension, hyperlipidemia, sleep apnea and morbid obesity. He works at [employer] and wears tight boots.  He developed an ulceration on the right big toe that progressively got bigger and deeper.  He was treated with antibiotics and wound care by Dr. Patel but there has been no improvement.  He denies any discomfort or calf claudication.  He underwent noninvasive vascular studies today and we could not detect any blood flow in the right big toe with moderately reduced ABI.  Interestingly, duplex showed no obstructive disease up to the level of the ankle but then there was no flow distal to that. He denies chest pain or shortness of breath.  He has prolonged history of diabetes but no tobacco use.  Past Medical History:  Diagnosis Date  . Allergy   . Diabetes mellitus    type 2  . Hyperlipidemia   . Hypertension   . Morbid obesity (HCC)    BMI 50  . Sleep apnea     Past Surgical History:  Procedure Laterality Date  . TONSILLECTOMY       Current Outpatient Medications  Medication Sig Dispense Refill  . aspirin EC 81 MG tablet Take 1 tablet (81  mg total) by mouth daily. Swallow whole.    . Blood Glucose Monitoring Suppl (ONE TOUCH ULTRA SYSTEM KIT) w/Device KIT Check blood sugar twice a day and as directed. Dx E11.8 1 each 0  . carvedilol (COREG) 3.125 MG tablet TAKE 1 TABLET (3.125 MG TOTAL) BY MOUTH 2 (TWO) TIMES DAILY WITH A MEAL. 180 tablet 0  . doxycycline (VIBRA-TABS) 100 MG tablet Take 1 tablet (100 mg total) by mouth 2 (two) times daily. 20 tablet 0  . furosemide (LASIX) 40 MG tablet Take 0.5 tablets (20 mg total) by mouth daily as needed for fluid or edema. 90 tablet 0  . glucose blood test strip CHECK BLOOD SUGAR TWICE DAILY 100 each 3  . losartan (COZAAR) 25 MG tablet TAKE 1 TABLET BY MOUTH EVERY DAY 90 tablet 1  . metFORMIN (GLUCOPHAGE) 500 MG tablet Take 2 tablets in the AM and 1 tablet int he evening    . tobramycin (TOBREX) 0.3 % ophthalmic solution 1 drop daily.    . TRULICITY 0.75 MG/0.5ML SOPN INJECT 0.75 MG INTO THE SKIN ONCE A WEEK. 4 mL 12   No current facility-administered medications for this visit.    Allergies:   Patient has no known allergies.    Social History:  The patient  reports that he has never smoked. He has never used smokeless tobacco. He reports that he   does not drink alcohol and does not use drugs.   Family History:  The patient's family history includes Congestive Heart Failure in an other family member; Diabetes in his father and another family member; Lupus in his daughter; Sjogren's syndrome in his daughter.    ROS:  Please see the history of present illness.   Otherwise, review of systems are positive for none.   All other systems are reviewed and negative.    PHYSICAL EXAM: VS:  BP 124/74 (BP Location: Left Arm, Patient Position: Sitting, Cuff Size: Large)   Pulse 92   Ht 5' 9" (1.753 m)   Wt (!) 348 lb (157.9 kg)   SpO2 98%   BMI 51.39 kg/m  , BMI Body mass index is 51.39 kg/m. GEN: Well nourished, well developed, in no acute distress  HEENT: normal  Neck: no JVD, carotid  bruits, or masses Cardiac: RRR; no murmurs, rubs, or gallops,no edema  Respiratory:  clear to auscultation bilaterally, normal work of breathing GI: soft, nontender, nondistended, + BS MS: no deformity or atrophy  Skin: warm and dry, no rash Neuro:  Strength and sensation are intact Psych: euthymic mood, full affect Vascular: Distal pulses are not palpable.  There is a large deep ulceration on the bottom of the right big toe.  EKG:  EKG is ordered today. The ekg ordered today demonstrates normal sinus rhythm with LVH.  No significant ST or T wave changes.   Recent Labs: No results found for requested labs within last 8760 hours.    Lipid Panel    Component Value Date/Time   CHOL 231 (H) 05/06/2019 0751   TRIG 186.0 (H) 05/06/2019 0751   HDL 46.80 05/06/2019 0751   CHOLHDL 5 05/06/2019 0751   VLDL 37.2 05/06/2019 0751   LDLCALC 147 (H) 05/06/2019 0751   LDLDIRECT 155.4 01/19/2011 0908      Wt Readings from Last 3 Encounters:  07/08/20 (!) 348 lb (157.9 kg)  02/13/20 (!) 368 lb 6 oz (167.1 kg)  08/29/19 (!) 365 lb 7 oz (165.8 kg)      No flowsheet data found.    ASSESSMENT AND PLAN:  1.  Peripheral arterial disease with critical limb ischemia: The patient has nonhealing ulceration on the right big toe.  He is at high risk for limb loss and amputation.  Noninvasive vascular study showed no detectable flow in the right big toe even though he does not seem to have significant disease in the main arteries.  I suspect that he has significant tibial disease from the ankle to the toes.  This is a limb threatening situation.  I have recommended proceeding with abdominal aortogram with lower extremity runoff and possible endovascular intervention.  We will proceed with the procedure next week.  I added aspirin 81 mg once daily.  2.  Essential hypertension: Blood pressure is controlled.  3.  Hyperlipidemia: Most recent lipid profile January 2021 showed an LDL of 147.  Given that  he is diabetic and now has peripheral arterial disease, I added atorvastatin 40 mg once daily.  Recommend a target LDL of less than 70.  4.  History of chronic systolic heart failure: No recent symptoms and most recent ejection fraction was normal.    Disposition:   FU with me in 1 month  Signed,  Kathlyn Sacramento, MD  07/08/2020 10:13 AM    [place]

## 2020-07-09 ENCOUNTER — Other Ambulatory Visit: Payer: Self-pay | Admitting: Family Medicine

## 2020-07-09 LAB — BASIC METABOLIC PANEL
BUN/Creatinine Ratio: 15 (ref 9–20)
BUN: 19 mg/dL (ref 6–24)
CO2: 23 mmol/L (ref 20–29)
Calcium: 9 mg/dL (ref 8.7–10.2)
Chloride: 101 mmol/L (ref 96–106)
Creatinine, Ser: 1.27 mg/dL (ref 0.76–1.27)
Glucose: 152 mg/dL — ABNORMAL HIGH (ref 65–99)
Potassium: 5 mmol/L (ref 3.5–5.2)
Sodium: 138 mmol/L (ref 134–144)
eGFR: 68 mL/min/{1.73_m2} (ref >59)

## 2020-07-09 LAB — CBC WITH DIFFERENTIAL/PLATELET
Basophils Absolute: 0.1 10*3/uL (ref 0.0–0.2)
Basos: 1 %
EOS (ABSOLUTE): 0.1 10*3/uL (ref 0.0–0.4)
Eos: 1 %
Hematocrit: 36.1 % — ABNORMAL LOW (ref 37.5–51.0)
Hemoglobin: 12.1 g/dL — ABNORMAL LOW (ref 13.0–17.7)
Immature Grans (Abs): 0 10*3/uL (ref 0.0–0.1)
Immature Granulocytes: 0 %
Lymphocytes Absolute: 2 10*3/uL (ref 0.7–3.1)
Lymphs: 17 %
MCH: 28.4 pg (ref 26.6–33.0)
MCHC: 33.5 g/dL (ref 31.5–35.7)
MCV: 85 fL (ref 79–97)
Monocytes Absolute: 0.8 10*3/uL (ref 0.1–0.9)
Monocytes: 7 %
Neutrophils Absolute: 8.7 10*3/uL — ABNORMAL HIGH (ref 1.4–7.0)
Neutrophils: 74 %
Platelets: 240 10*3/uL (ref 150–450)
RBC: 4.26 x10E6/uL (ref 4.14–5.80)
RDW: 12.4 % (ref 11.6–15.4)
WBC: 11.8 10*3/uL — ABNORMAL HIGH (ref 3.4–10.8)

## 2020-07-09 LAB — HEMOGLOBIN A1C
Est. average glucose Bld gHb Est-mCnc: 301 mg/dL
Hgb A1c MFr Bld: 12.1 % — ABNORMAL HIGH (ref 4.8–5.6)

## 2020-07-12 ENCOUNTER — Other Ambulatory Visit
Admission: RE | Admit: 2020-07-12 | Discharge: 2020-07-12 | Disposition: A | Discharge status: Home / Self Care | Payer: BC Managed Care – PPO | Source: Ambulatory Visit | Attending: Cardiovascular Disease | Admitting: Cardiovascular Disease

## 2020-07-12 ENCOUNTER — Other Ambulatory Visit: Payer: Self-pay

## 2020-07-12 DIAGNOSIS — Z20822 Contact with and (suspected) exposure to covid-19: Secondary | ICD-10-CM | POA: Diagnosis not present

## 2020-07-12 DIAGNOSIS — Z01812 Encounter for preprocedural laboratory examination: Secondary | ICD-10-CM | POA: Insufficient documentation

## 2020-07-12 LAB — SARS CORONAVIRUS 2 (TAT 6-24 HRS): SARS Coronavirus 2: NEGATIVE

## 2020-07-13 ENCOUNTER — Ambulatory Visit: Payer: BC Managed Care – PPO | Admitting: Podiatry

## 2020-07-13 ENCOUNTER — Telehealth: Payer: Self-pay | Admitting: *Deleted

## 2020-07-13 MED ORDER — ALPRAZOLAM 0.5 MG PO TABS: 0.5000 mg | ORAL_TABLET | Freq: Every day | ORAL | 0 refills | Status: DC | PRN

## 2020-07-13 NOTE — Telephone Encounter (Signed)
Per RN [facility] should arrive 07/14/20 at 7:30 AM for same day COVID-19 test prior to procedure at 10:30 AM. I spoke with patient and he knows to arrive at [facility] tomorrow at 7:30 AM for same day COVID-19 test.

## 2020-07-13 NOTE — Telephone Encounter (Addendum)
Pt contacted pre-abdominal aortogram  scheduled at [facility] for: Wednesday July 14, 2020 10:30 AM Verified arrival time and place: [facility] Main Entrance A [facility]) at: 8:30 AM   No solid food after midnight prior to cath, clear liquids until 5 AM day of procedure.  Hold: Metformin-day of procedure and 48 hours post procedure Lasix-AM of procedure Trulicity- weekly on Wednesdays-will  take day before or day after.  Except hold medications AM meds can be  taken pre-cath with sips of water including: ASA 81 mg   Confirmed patient has responsible adult to drive home post procedure and be with patient first 24 hours after arriving home: yes  You are allowed ONE visitor in the waiting room during the time you are at the hospital for your procedure. Both you and your visitor must wear a mask once you enter the hospital.   Reviewed procedure/mask/visitor instructions with patient.   Pt had pre-procedure COVID-19 test yesterday that was negative. Patient was at work when I called to review procedure instructions with him today.  Pt states that he was not told that after he had COVID test that he should quarantine at home until he went to hospital for procedure.  Pt reports that he works in a cubicle with a mask on and is not within 6 feet of others. Pt reports that he has had 2 COVID-19 vaccines, had COVID August 2021, received infusion, and has not had any new symptoms concerning for COVID -19 recently.              I will forward to Dr Kirke Corin and Victorino Dike, RN [facility] for recommendations about additional COVID -19 test prior to procedure tomorrow.

## 2020-07-14 ENCOUNTER — Ambulatory Visit (HOSPITAL_COMMUNITY)
Admission: RE | Admit: 2020-07-14 | Discharge: 2020-07-14 | Disposition: A | Discharge status: Home / Self Care | Payer: BC Managed Care – PPO | Source: Ambulatory Visit | Attending: Cardiovascular Disease | Admitting: Cardiovascular Disease

## 2020-07-14 ENCOUNTER — Other Ambulatory Visit: Payer: Self-pay

## 2020-07-14 ENCOUNTER — Encounter (HOSPITAL_COMMUNITY)
Admission: RE | Disposition: A | Discharge status: Home / Self Care | Payer: Self-pay | Source: Ambulatory Visit | Attending: Cardiovascular Disease

## 2020-07-14 ENCOUNTER — Telehealth: Payer: Self-pay

## 2020-07-14 DIAGNOSIS — Z8249 Family history of ischemic heart disease and other diseases of the circulatory system: Secondary | ICD-10-CM | POA: Diagnosis not present

## 2020-07-14 DIAGNOSIS — E11621 Type 2 diabetes mellitus with foot ulcer: Secondary | ICD-10-CM | POA: Insufficient documentation

## 2020-07-14 DIAGNOSIS — Z79899 Other long term (current) drug therapy: Secondary | ICD-10-CM | POA: Diagnosis not present

## 2020-07-14 DIAGNOSIS — I5022 Chronic systolic (congestive) heart failure: Secondary | ICD-10-CM | POA: Insufficient documentation

## 2020-07-14 DIAGNOSIS — Z7982 Long term (current) use of aspirin: Secondary | ICD-10-CM | POA: Insufficient documentation

## 2020-07-14 DIAGNOSIS — Z6841 Body Mass Index (BMI) 40.0 and over, adult: Secondary | ICD-10-CM | POA: Insufficient documentation

## 2020-07-14 DIAGNOSIS — Z20822 Contact with and (suspected) exposure to covid-19: Secondary | ICD-10-CM | POA: Insufficient documentation

## 2020-07-14 DIAGNOSIS — L97519 Non-pressure chronic ulcer of other part of right foot with unspecified severity: Secondary | ICD-10-CM | POA: Diagnosis not present

## 2020-07-14 DIAGNOSIS — Z833 Family history of diabetes mellitus: Secondary | ICD-10-CM | POA: Insufficient documentation

## 2020-07-14 DIAGNOSIS — Z794 Long term (current) use of insulin: Secondary | ICD-10-CM | POA: Insufficient documentation

## 2020-07-14 DIAGNOSIS — I739 Peripheral vascular disease, unspecified: Secondary | ICD-10-CM

## 2020-07-14 DIAGNOSIS — I70235 Atherosclerosis of native arteries of right leg with ulceration of other part of foot: Secondary | ICD-10-CM | POA: Diagnosis not present

## 2020-07-14 DIAGNOSIS — I110 Hypertensive heart disease with heart failure: Secondary | ICD-10-CM | POA: Diagnosis not present

## 2020-07-14 DIAGNOSIS — E1151 Type 2 diabetes mellitus with diabetic peripheral angiopathy without gangrene: Secondary | ICD-10-CM | POA: Insufficient documentation

## 2020-07-14 DIAGNOSIS — E785 Hyperlipidemia, unspecified: Secondary | ICD-10-CM | POA: Insufficient documentation

## 2020-07-14 DIAGNOSIS — Z7984 Long term (current) use of oral hypoglycemic drugs: Secondary | ICD-10-CM | POA: Insufficient documentation

## 2020-07-14 HISTORY — PX: ABDOMINAL AORTOGRAM W/LOWER EXTREMITY: CATH118223

## 2020-07-14 LAB — GLUCOSE, CAPILLARY
Glucose-Capillary: 169 mg/dL — ABNORMAL HIGH (ref 70–99)
Glucose-Capillary: 163 mg/dL — ABNORMAL HIGH (ref 70–99)

## 2020-07-14 LAB — SARS CORONAVIRUS 2 BY RT PCR (HOSPITAL ORDER, PERFORMED IN ~~LOC~~ HOSPITAL LAB): SARS Coronavirus 2: NEGATIVE

## 2020-07-14 LAB — POCT ACTIVATED CLOTTING TIME
Activated Clotting Time: 261 seconds
Activated Clotting Time: 261 seconds
Activated Clotting Time: 273 seconds

## 2020-07-14 SURGERY — ABDOMINAL AORTOGRAM W/LOWER EXTREMITY
Anesthesia: LOCAL

## 2020-07-14 MED ORDER — FENTANYL CITRATE (PF) 100 MCG/2ML IJ SOLN
INTRAMUSCULAR | Status: DC | PRN
  Administered 2020-07-14 (×2): 50 ug via INTRAVENOUS

## 2020-07-14 MED ORDER — MIDAZOLAM HCL 2 MG/2ML IJ SOLN
INTRAMUSCULAR | Status: AC
  Filled 2020-07-14: qty 2

## 2020-07-14 MED ORDER — LIDOCAINE HCL (PF) 1 % IJ SOLN
INTRAMUSCULAR | Status: DC | PRN
  Administered 2020-07-14: 15 mL

## 2020-07-14 MED ORDER — LABETALOL HCL 5 MG/ML IV SOLN
INTRAVENOUS | Status: AC
  Filled 2020-07-14: qty 4

## 2020-07-14 MED ORDER — VERAPAMIL HCL 2.5 MG/ML IV SOLN
INTRAVENOUS | Status: AC
  Filled 2020-07-14: qty 2

## 2020-07-14 MED ORDER — VIPERSLIDE LUBRICANT OPTIME: TOPICAL | Status: DC | PRN

## 2020-07-14 MED ORDER — SODIUM CHLORIDE 0.9 % IV SOLN: INTRAVENOUS | Status: DC

## 2020-07-14 MED ORDER — CLOPIDOGREL BISULFATE 300 MG PO TABS
ORAL_TABLET | ORAL | Status: DC | PRN
  Administered 2020-07-14: 300 mg via ORAL

## 2020-07-14 MED ORDER — CLOPIDOGREL BISULFATE 75 MG PO TABS: 75.0000 mg | ORAL_TABLET | Freq: Every day | ORAL | 6 refills | Status: DC

## 2020-07-14 MED ORDER — HEPARIN (PORCINE) IN NACL 1000-0.9 UT/500ML-% IV SOLN
INTRAVENOUS | Status: DC | PRN
  Administered 2020-07-14 (×2): 500 mL

## 2020-07-14 MED ORDER — ACETAMINOPHEN 325 MG PO TABS: 650.0000 mg | ORAL_TABLET | ORAL | Status: DC | PRN

## 2020-07-14 MED ORDER — NITROGLYCERIN 1 MG/10 ML FOR IR/CATH LAB
INTRA_ARTERIAL | Status: DC | PRN
  Administered 2020-07-14: 200 ug

## 2020-07-14 MED ORDER — SODIUM CHLORIDE 0.9% FLUSH: 3.0000 mL | Freq: Two times a day (BID) | INTRAVENOUS | Status: DC

## 2020-07-14 MED ORDER — ASPIRIN 81 MG PO CHEW
81.0000 mg | CHEWABLE_TABLET | ORAL | Status: DC
Start: 2020-07-14 — End: 2020-07-14

## 2020-07-14 MED ORDER — FENTANYL CITRATE (PF) 100 MCG/2ML IJ SOLN
INTRAMUSCULAR | Status: AC
  Filled 2020-07-14: qty 2

## 2020-07-14 MED ORDER — IODIXANOL 320 MG/ML IV SOLN
INTRAVENOUS | Status: DC | PRN
  Administered 2020-07-14: 150 mL via INTRA_ARTERIAL

## 2020-07-14 MED ORDER — MIDAZOLAM HCL 2 MG/2ML IJ SOLN
INTRAMUSCULAR | Status: DC | PRN
  Administered 2020-07-14 (×4): 1 mg via INTRAVENOUS

## 2020-07-14 MED ORDER — HEPARIN SODIUM (PORCINE) 1000 UNIT/ML IJ SOLN
INTRAMUSCULAR | Status: DC | PRN
  Administered 2020-07-14: 12000 [IU] via INTRAVENOUS
  Administered 2020-07-14: 3000 [IU] via INTRAVENOUS

## 2020-07-14 MED ORDER — HEPARIN (PORCINE) IN NACL 1000-0.9 UT/500ML-% IV SOLN
INTRAVENOUS | Status: AC
  Filled 2020-07-14: qty 500

## 2020-07-14 MED ORDER — LIDOCAINE HCL (PF) 1 % IJ SOLN
INTRAMUSCULAR | Status: AC
  Filled 2020-07-14: qty 30

## 2020-07-14 MED ORDER — ONDANSETRON HCL 4 MG/2ML IJ SOLN: 4.0000 mg | Freq: Four times a day (QID) | INTRAMUSCULAR | Status: DC | PRN

## 2020-07-14 MED ORDER — NITROGLYCERIN IN D5W 200-5 MCG/ML-% IV SOLN
INTRAVENOUS | Status: AC
  Filled 2020-07-14: qty 250

## 2020-07-14 MED ORDER — LABETALOL HCL 5 MG/ML IV SOLN: 10.0000 mg | INTRAVENOUS | Status: DC | PRN

## 2020-07-14 MED ORDER — SODIUM CHLORIDE 0.9 % IV SOLN: 250.0000 mL | INTRAVENOUS | Status: DC | PRN

## 2020-07-14 MED ORDER — LABETALOL HCL 5 MG/ML IV SOLN
INTRAVENOUS | Status: DC | PRN
  Administered 2020-07-14 (×2): 10 mg via INTRAVENOUS

## 2020-07-14 MED ORDER — CLOPIDOGREL BISULFATE 300 MG PO TABS
ORAL_TABLET | ORAL | Status: AC
  Filled 2020-07-14: qty 1

## 2020-07-14 MED ORDER — SODIUM CHLORIDE 0.9% FLUSH: 3.0000 mL | INTRAVENOUS | Status: DC | PRN

## 2020-07-14 SURGICAL SUPPLY — 32 items
BAG SNAP BAND KOVER 36X36 (MISCELLANEOUS) ×2 IMPLANT
BALLN JADE .014 3.5 X 60 (BALLOONS) ×2
BALLN JADE .018 3.0 X 40 (BALLOONS) ×2
BALLOON JADE .014 3.5 X 60 (BALLOONS) ×1 IMPLANT
BALLOON JADE .018 3.0 X 40 (BALLOONS) ×1 IMPLANT
CATH ANGIO 5F PIGTAIL 65CM (CATHETERS) ×2 IMPLANT
CATH CROSS OVER TEMPO 5F (CATHETERS) ×2 IMPLANT
CATH QUICKCROSS .018X135CM (MICROCATHETER) ×2 IMPLANT
CATH STRAIGHT 5FR 65CM (CATHETERS) ×2 IMPLANT
CROWN STEALTH MICRO-30 1.25MM (CATHETERS) ×2 IMPLANT
DCB RANGER 4.0X40 135 (BALLOONS) ×1 IMPLANT
DEVICE CLOSURE MYNXGRIP 6/7F (Vascular Products) ×2 IMPLANT
GUIDEWIRE ZILIENT 12G 014X300 (WIRE) ×2 IMPLANT
GUIDEWIRE ZILIENT 6G 014 (WIRE) ×2 IMPLANT
KIT ENCORE 26 ADVANTAGE (KITS) ×2 IMPLANT
KIT MICROPUNCTURE NIT STIFF (SHEATH) ×2 IMPLANT
KIT PV (KITS) ×2 IMPLANT
RANGER DCB 4.0X40 135 (BALLOONS) ×2
SHEATH PINNACLE 5F 10CM (SHEATH) ×2 IMPLANT
SHEATH PINNACLE 6F 10CM (SHEATH) ×2 IMPLANT
SHEATH PINNACLE ST 6F 65CM (SHEATH) ×2 IMPLANT
SHEATH PROBE COVER 6X72 (BAG) ×2 IMPLANT
SHIELD RADPAD SCOOP 12X17 (MISCELLANEOUS) ×2 IMPLANT
STOPCOCK MORSE 400PSI 3WAY (MISCELLANEOUS) ×2 IMPLANT
SYR MEDRAD MARK 7 150ML (SYRINGE) ×2 IMPLANT
TAPE VIPERTRACK RADIOPAQ (MISCELLANEOUS) ×1 IMPLANT
TAPE VIPERTRACK RADIOPAQUE (MISCELLANEOUS) ×2
TRANSDUCER W/STOPCOCK (MISCELLANEOUS) ×2 IMPLANT
TRAY PV CATH (CUSTOM PROCEDURE TRAY) ×2 IMPLANT
TUBING CIL FLEX 10 FLL-RA (TUBING) ×2 IMPLANT
WIRE HITORQ VERSACORE ST 145CM (WIRE) ×2 IMPLANT
WIRE VIPER ADVANCE .017X335CM (WIRE) ×2 IMPLANT

## 2020-07-14 NOTE — Telephone Encounter (Signed)
Patient currently admitted.  Holding slot at [facility] 4/15 for LEA  and 4/19 at [address]

## 2020-07-14 NOTE — Interval H&P Note (Signed)
History and Physical Interval Note:  07/14/2020 10:29 AM  Austin Shaw  has presented today for surgery, with the diagnosis of pad.  The various methods of treatment have been discussed with the patient and family. After consideration of risks, benefits and other options for treatment, the patient has consented to  Procedure(s): ABDOMINAL AORTOGRAM W/LOWER EXTREMITY (N/A) as a surgical intervention.  The patient's history has been reviewed, patient examined, no change in status, stable for surgery.  I have reviewed the patient's chart and labs.  Questions were answered to the patient's satisfaction.     Lorine Bears

## 2020-07-14 NOTE — Telephone Encounter (Signed)
-----   Message from Iran Ouch, MD sent at 07/14/2020 12:56 PM EDT ----- Status post revascularization of right lower extremity.  Schedule an ABI and LEA within 2 weeks and follow-up with me after.

## 2020-07-14 NOTE — Progress Notes (Signed)
Went over d/c instructions with patient and spouse.  Patient and spouse have several questions for Dr. Kirke Corin .  Paged Dr. Kirke Corin and asked if they could ask Dr, Kirke Corin to speak to patient before d/c.

## 2020-07-14 NOTE — Telephone Encounter (Signed)
Please contact the patient to schedule the post procedure LEA, ABI and follow up.

## 2020-07-14 NOTE — Discharge Instructions (Signed)
Femoral Site Care  This sheet gives you information about how to care for yourself after your procedure. Your health care provider may also give you more specific instructions. If you have problems or questions, contact your health care provider. What can I expect after the procedure? After the procedure, it is common to have:  Bruising that usually fades within 1-2 weeks.  Tenderness at the site. Follow these instructions at home: Wound care  Follow instructions from your health care provider about how to take care of your insertion site. Make sure you: ? Wash your hands with soap and water before you change your bandage (dressing). If soap and water are not available, use hand sanitizer. ? Change your dressing as told by your health care provider. ? Leave stitches (sutures), skin glue, or adhesive strips in place. These skin closures may need to stay in place for 2 weeks or longer. If adhesive strip edges start to loosen and curl up, you may trim the loose edges. Do not remove adhesive strips completely unless your health care provider tells you to do that.  Do not take baths, swim, or use a hot tub until your health care provider approves.  You may shower 24-48 hours after the procedure or as told by your health care provider. ? Gently wash the site with plain soap and water. ? Pat the area dry with a clean towel. ? Do not rub the site. This may cause bleeding.  Do not apply powder or lotion to the site. Keep the site clean and dry.  Check your femoral site every day for signs of infection. Check for: ? Redness, swelling, or pain. ? Fluid or blood. ? Warmth. ? Pus or a bad smell. Activity  For the first 2-3 days after your procedure, or as long as directed: ? Avoid climbing stairs as much as possible. ? Do not squat.  Do not lift anything that is heavier than 10 lb (4.5 kg), or the limit that you are told, until your health care provider says that it is safe.  Rest as  directed. ? Avoid sitting for a long time without moving. Get up to take short walks every 1-2 hours.  Do not drive for 24 hours if you were given a medicine to help you relax (sedative). General instructions  Take over-the-counter and prescription medicines only as told by your health care provider.  Keep all follow-up visits as told by your health care provider. This is important. Contact a health care provider if you have:  A fever or chills.  You have redness, swelling, or pain around your insertion site. Get help right away if:  The catheter insertion area swells very fast.  You pass out.  You suddenly start to sweat or your skin gets clammy.  The catheter insertion area is bleeding, and the bleeding does not stop when you hold steady pressure on the area.  The area near or just beyond the catheter insertion site becomes pale, cool, tingly, or numb. These symptoms may represent a serious problem that is an emergency. Do not wait to see if the symptoms will go away. Get medical help right away. Call your local emergency services (911 in the [place] Do not drive yourself to the hospital. Summary  After the procedure, it is common to have bruising that usually fades within 1-2 weeks.  Check your femoral site every day for signs of infection.  Do not lift anything that is heavier than 10 lb (4.5 kg), or   the limit that you are told, until your health care provider says that it is safe. This information is not intended to replace advice given to you by your health care provider. Make sure you discuss any questions you have with your health care provider. Document Revised: 11/28/2019 Document Reviewed: 11/28/2019 Elsevier Patient Education  2021 [employer]  

## 2020-07-15 ENCOUNTER — Telehealth: Payer: Self-pay | Admitting: Cardiovascular Disease

## 2020-07-15 ENCOUNTER — Encounter (HOSPITAL_COMMUNITY): Payer: Self-pay | Admitting: Cardiovascular Disease

## 2020-07-15 NOTE — Telephone Encounter (Signed)
  Returned the patients call.  In discussion initially the patient described the pain in his right foot as minimal.  Patient sts that he is trying to take it easy today. Reports that the discomfort occurs with ambulation. He denies pain at the procedure site, no excess bruising, no swelling, no bleeding. He sts that Tylenol is not helping with the pain and he needs something prescribed.  Patient sent a mychart message 1-2 hours before calling the office (mychart msg was responded to).  Reiterated to the patient that he could safely take Tyleonol up to 3,000 mg within a 24 hour period. Also reiterated that we do not prescribe pain medications. He then became a bit irritated. Stating " what am I suppose to do when I need to walk to the bathroom". "Im a big guy and it puts a lot of pressure on my foot".  Adv him that mild discomfort or soreness is expected after a procedure and that should continue to improve over the the next couple of days. Adv him to continue to take it easy today. If symptoms do not improve in the next 1-2 days he should let us know.

## 2020-07-15 NOTE — Telephone Encounter (Signed)
Scheduled

## 2020-07-15 NOTE — Telephone Encounter (Signed)
Spoke with patient to schedule vascular fu.     Patient now c/ pain in RLE with continued swelling . Patient states tylenol is not helping and he would like to know if Dr. Kirke Corin can give him something else.    Patient c/o 1-2 / 10 pain at rest and 7-8 / 10 pain when trying to ambulate.

## 2020-07-16 ENCOUNTER — Other Ambulatory Visit: Payer: Self-pay

## 2020-07-16 ENCOUNTER — Encounter: Payer: Self-pay | Admitting: Family Medicine

## 2020-07-16 ENCOUNTER — Ambulatory Visit (INDEPENDENT_AMBULATORY_CARE_PROVIDER_SITE_OTHER): Payer: BC Managed Care – PPO | Admitting: Family Medicine

## 2020-07-16 DIAGNOSIS — E1165 Type 2 diabetes mellitus with hyperglycemia: Secondary | ICD-10-CM

## 2020-07-16 DIAGNOSIS — IMO0002 Reserved for concepts with insufficient information to code with codable children: Secondary | ICD-10-CM

## 2020-07-16 DIAGNOSIS — E118 Type 2 diabetes mellitus with unspecified complications: Secondary | ICD-10-CM

## 2020-07-16 MED ORDER — TRULICITY 0.75 MG/0.5ML ~~LOC~~ SOAJ: 0.7500 mg | SUBCUTANEOUS | Status: DC

## 2020-07-16 MED ORDER — ATORVASTATIN CALCIUM 40 MG PO TABS: 20.0000 mg | ORAL_TABLET | Freq: Every day | ORAL | Status: DC

## 2020-07-16 MED ORDER — FUROSEMIDE 40 MG PO TABS: 40.0000 mg | ORAL_TABLET | Freq: Every day | ORAL | Status: DC | PRN

## 2020-07-16 NOTE — Progress Notes (Signed)
This visit occurred during the SARS-CoV-2 public health emergency.  Safety protocols were in place, including screening questions prior to the visit, additional usage of staff PPE, and extensive cleaning of exam room while observing appropriate contact time as indicated for disinfecting solutions.  Diabetes:  Using medications without difficulties: metformin and trulicity.   Hypoglycemic episodes: Hyperglycemic episodes: Feet problems: see below.   Blood Sugars averaging: lowest 159, highest 222, below 200 in the AMs recently. eye exam within last year: No ADE on trulicity recently.  He had some itching at the injection site prev but not on the last 2 episodes.  No abd pain, no nausea with trulicity use.    R foot in post op shoe.  No fevers. No chills.     He couldn't tolerate lipitor 40mg  on retrial.  Nausea "and I felt bad" taking it.   No recent lasix use.  D/w pt about trial of 20mg  at night.    Vascular work-up discussed with patient.  Per outside clinic : dual antiplatelet therapy for few months. Aggressive treatment of risk factors. Suspect good improvement in blood flow with today's procedure given excellent collaterals to the anterior and posterior tibial artery distally.  However, if healing is not optimal, we might need to consider retrograde revascularization of the anterior tibial artery.  Meds, vitals, and allergies reviewed.   ROS: Per HPI unless specifically indicated in ROS section   GEN: nad, alert and oriented HEENT: ncat NECK: supple w/o LA CV: rrr. PULM: ctab, no inc wob ABD: soft, +bs EXT: no edema SKIN: See foot exam.  Right foot with decreased dorsalis pedis pulse. He is ulceration on the plantar side of the right first toe that does not appear to probe to bone.  He has macerated tissue with dead skin dorsally that needed to be trimmed.  This was done without pain or complication.  Approximately 1 cm of dead skin was removed.  His got some local erythema  near the toe but no purulent discharge.  Area was redressed with nonstick bandage.  35 minutes were devoted to patient care in this encounter (this includes time spent reviewing the patient's file/history, interviewing and examining the patient, counseling/reviewing plan with patient).

## 2020-07-16 NOTE — Patient Instructions (Signed)
Keep dressing your toe as planned.  Continue the antibiotics.  Try 20mg  (1/2 tab) of lipitor.  Update me if not tolerated.  trulicity shot tonight.  Try to increase to 2 trulicity shots next week (1.5mg ).   Update me about your sugar and the med effect.  Take care.  Glad to see you.

## 2020-07-18 NOTE — Assessment & Plan Note (Signed)
Discussed options. Advised him to keep dressing your toe as planned.  Continue the doxycycline.  Try 20mg  (1/2 tab) of lipitor.  Update me if not tolerated.  Continue his previous trulicity dose tonight.  Try to increase to 2 trulicity shots next week (1.5mg  total dose).   I want him to update me about his sugar and the med effect.  Routine cautions given patient.  He has podiatry follow-up pending.  He agrees with plan.

## 2020-07-20 ENCOUNTER — Ambulatory Visit: Payer: BC Managed Care – PPO | Admitting: Podiatry

## 2020-07-20 ENCOUNTER — Encounter: Payer: Self-pay | Admitting: Podiatry

## 2020-07-20 ENCOUNTER — Inpatient Hospital Stay (HOSPITAL_COMMUNITY)
Admission: EM | Admit: 2020-07-20 | Discharge: 2020-07-27 | DRG: 854 | Disposition: A | Discharge status: Home Health | Payer: BC Managed Care – PPO | Attending: Internal Medicine | Admitting: Internal Medicine

## 2020-07-20 ENCOUNTER — Other Ambulatory Visit: Payer: Self-pay

## 2020-07-20 ENCOUNTER — Emergency Department (HOSPITAL_COMMUNITY): Payer: BC Managed Care – PPO

## 2020-07-20 ENCOUNTER — Encounter (HOSPITAL_COMMUNITY): Payer: Self-pay

## 2020-07-20 VITALS — Temp 98.9°F

## 2020-07-20 DIAGNOSIS — Z833 Family history of diabetes mellitus: Secondary | ICD-10-CM | POA: Diagnosis not present

## 2020-07-20 DIAGNOSIS — I429 Cardiomyopathy, unspecified: Secondary | ICD-10-CM

## 2020-07-20 DIAGNOSIS — E1152 Type 2 diabetes mellitus with diabetic peripheral angiopathy with gangrene: Secondary | ICD-10-CM | POA: Diagnosis present

## 2020-07-20 DIAGNOSIS — Z9889 Other specified postprocedural states: Secondary | ICD-10-CM

## 2020-07-20 DIAGNOSIS — Z888 Allergy status to other drugs, medicaments and biological substances status: Secondary | ICD-10-CM | POA: Diagnosis not present

## 2020-07-20 DIAGNOSIS — M869 Osteomyelitis, unspecified: Secondary | ICD-10-CM | POA: Diagnosis not present

## 2020-07-20 DIAGNOSIS — Z7902 Long term (current) use of antithrombotics/antiplatelets: Secondary | ICD-10-CM

## 2020-07-20 DIAGNOSIS — I96 Gangrene, not elsewhere classified: Secondary | ICD-10-CM | POA: Diagnosis not present

## 2020-07-20 DIAGNOSIS — I7301 Raynaud's syndrome with gangrene: Secondary | ICD-10-CM | POA: Diagnosis present

## 2020-07-20 DIAGNOSIS — L03115 Cellulitis of right lower limb: Secondary | ICD-10-CM

## 2020-07-20 DIAGNOSIS — Z6841 Body Mass Index (BMI) 40.0 and over, adult: Secondary | ICD-10-CM | POA: Diagnosis not present

## 2020-07-20 DIAGNOSIS — E1169 Type 2 diabetes mellitus with other specified complication: Secondary | ICD-10-CM | POA: Diagnosis present

## 2020-07-20 DIAGNOSIS — E785 Hyperlipidemia, unspecified: Secondary | ICD-10-CM | POA: Diagnosis present

## 2020-07-20 DIAGNOSIS — M86671 Other chronic osteomyelitis, right ankle and foot: Secondary | ICD-10-CM | POA: Diagnosis not present

## 2020-07-20 DIAGNOSIS — Z7982 Long term (current) use of aspirin: Secondary | ICD-10-CM

## 2020-07-20 DIAGNOSIS — I110 Hypertensive heart disease with heart failure: Secondary | ICD-10-CM | POA: Diagnosis present

## 2020-07-20 DIAGNOSIS — N179 Acute kidney failure, unspecified: Secondary | ICD-10-CM | POA: Diagnosis present

## 2020-07-20 DIAGNOSIS — Z794 Long term (current) use of insulin: Secondary | ICD-10-CM

## 2020-07-20 DIAGNOSIS — E1140 Type 2 diabetes mellitus with diabetic neuropathy, unspecified: Secondary | ICD-10-CM | POA: Diagnosis present

## 2020-07-20 DIAGNOSIS — I70261 Atherosclerosis of native arteries of extremities with gangrene, right leg: Secondary | ICD-10-CM | POA: Diagnosis present

## 2020-07-20 DIAGNOSIS — L97513 Non-pressure chronic ulcer of other part of right foot with necrosis of muscle: Secondary | ICD-10-CM

## 2020-07-20 DIAGNOSIS — I509 Heart failure, unspecified: Secondary | ICD-10-CM | POA: Diagnosis not present

## 2020-07-20 DIAGNOSIS — I5022 Chronic systolic (congestive) heart failure: Secondary | ICD-10-CM | POA: Diagnosis present

## 2020-07-20 DIAGNOSIS — Z7984 Long term (current) use of oral hypoglycemic drugs: Secondary | ICD-10-CM

## 2020-07-20 DIAGNOSIS — I739 Peripheral vascular disease, unspecified: Secondary | ICD-10-CM

## 2020-07-20 DIAGNOSIS — E118 Type 2 diabetes mellitus with unspecified complications: Secondary | ICD-10-CM | POA: Diagnosis not present

## 2020-07-20 DIAGNOSIS — E11628 Type 2 diabetes mellitus with other skin complications: Secondary | ICD-10-CM | POA: Diagnosis present

## 2020-07-20 DIAGNOSIS — Z20822 Contact with and (suspected) exposure to covid-19: Secondary | ICD-10-CM | POA: Diagnosis present

## 2020-07-20 DIAGNOSIS — B951 Streptococcus, group B, as the cause of diseases classified elsewhere: Secondary | ICD-10-CM | POA: Diagnosis not present

## 2020-07-20 DIAGNOSIS — Z8249 Family history of ischemic heart disease and other diseases of the circulatory system: Secondary | ICD-10-CM | POA: Diagnosis not present

## 2020-07-20 DIAGNOSIS — M868X7 Other osteomyelitis, ankle and foot: Secondary | ICD-10-CM | POA: Diagnosis present

## 2020-07-20 DIAGNOSIS — Z79899 Other long term (current) drug therapy: Secondary | ICD-10-CM | POA: Diagnosis not present

## 2020-07-20 DIAGNOSIS — L089 Local infection of the skin and subcutaneous tissue, unspecified: Secondary | ICD-10-CM | POA: Diagnosis not present

## 2020-07-20 DIAGNOSIS — I10 Essential (primary) hypertension: Secondary | ICD-10-CM | POA: Diagnosis present

## 2020-07-20 DIAGNOSIS — L02611 Cutaneous abscess of right foot: Secondary | ICD-10-CM | POA: Diagnosis present

## 2020-07-20 DIAGNOSIS — E11621 Type 2 diabetes mellitus with foot ulcer: Secondary | ICD-10-CM | POA: Diagnosis present

## 2020-07-20 DIAGNOSIS — B9620 Unspecified Escherichia coli [E. coli] as the cause of diseases classified elsewhere: Secondary | ICD-10-CM | POA: Diagnosis not present

## 2020-07-20 DIAGNOSIS — A419 Sepsis, unspecified organism: Principal | ICD-10-CM | POA: Diagnosis present

## 2020-07-20 DIAGNOSIS — L97519 Non-pressure chronic ulcer of other part of right foot with unspecified severity: Secondary | ICD-10-CM | POA: Diagnosis present

## 2020-07-20 DIAGNOSIS — E1165 Type 2 diabetes mellitus with hyperglycemia: Secondary | ICD-10-CM | POA: Diagnosis present

## 2020-07-20 DIAGNOSIS — L0390 Cellulitis, unspecified: Secondary | ICD-10-CM | POA: Diagnosis not present

## 2020-07-20 LAB — RESP PANEL BY RT-PCR (FLU A&B, COVID) ARPGX2
Influenza A by PCR: NEGATIVE
Influenza B by PCR: NEGATIVE
SARS Coronavirus 2 by RT PCR: NEGATIVE

## 2020-07-20 LAB — CBC WITH DIFFERENTIAL/PLATELET
Abs Immature Granulocytes: 0.34 10*3/uL — ABNORMAL HIGH (ref 0.00–0.07)
Basophils Absolute: 0.1 10*3/uL (ref 0.0–0.1)
Basophils Relative: 0 %
Eosinophils Absolute: 0.1 10*3/uL (ref 0.0–0.5)
Eosinophils Relative: 0 %
HCT: 36.8 % — ABNORMAL LOW (ref 39.0–52.0)
Hemoglobin: 11.7 g/dL — ABNORMAL LOW (ref 13.0–17.0)
Immature Granulocytes: 1 %
Lymphocytes Relative: 10 %
Lymphs Abs: 2.5 10*3/uL (ref 0.7–4.0)
MCH: 28.5 pg (ref 26.0–34.0)
MCHC: 31.8 g/dL (ref 30.0–36.0)
MCV: 89.5 fL (ref 80.0–100.0)
Monocytes Absolute: 1.5 10*3/uL — ABNORMAL HIGH (ref 0.1–1.0)
Monocytes Relative: 6 %
Neutro Abs: 19.2 10*3/uL — ABNORMAL HIGH (ref 1.7–7.7)
Neutrophils Relative %: 83 %
Platelets: 373 10*3/uL (ref 150–400)
RBC: 4.11 MIL/uL — ABNORMAL LOW (ref 4.22–5.81)
RDW: 13.2 % (ref 11.5–15.5)
WBC: 23.7 10*3/uL — ABNORMAL HIGH (ref 4.0–10.5)
nRBC: 0 % (ref 0.0–0.2)

## 2020-07-20 LAB — BASIC METABOLIC PANEL
Anion gap: 10 (ref 5–15)
BUN: 24 mg/dL — ABNORMAL HIGH (ref 6–20)
CO2: 27 mmol/L (ref 22–32)
Calcium: 9.1 mg/dL (ref 8.9–10.3)
Chloride: 96 mmol/L — ABNORMAL LOW (ref 98–111)
Creatinine, Ser: 1.63 mg/dL — ABNORMAL HIGH (ref 0.61–1.24)
GFR, Estimated: 50 mL/min — ABNORMAL LOW (ref >60)
Glucose, Bld: 180 mg/dL — ABNORMAL HIGH (ref 70–99)
Potassium: 4 mmol/L (ref 3.5–5.1)
Sodium: 133 mmol/L — ABNORMAL LOW (ref 135–145)

## 2020-07-20 LAB — LACTIC ACID, PLASMA
Lactic Acid, Venous: 2.1 mmol/L (ref 0.5–1.9)
Lactic Acid, Venous: 1.4 mmol/L (ref 0.5–1.9)

## 2020-07-20 MED ORDER — VANCOMYCIN HCL 10 G IV SOLR
1750.0000 mg | INTRAVENOUS | Status: DC
  Filled 2020-07-20: qty 1750

## 2020-07-20 MED ORDER — LOSARTAN POTASSIUM 25 MG PO TABS
25.0000 mg | ORAL_TABLET | Freq: Every day | ORAL | Status: DC
  Administered 2020-07-21 – 2020-07-24 (×4): 25 mg via ORAL
  Filled 2020-07-20 (×4): qty 1

## 2020-07-20 MED ORDER — PIPERACILLIN-TAZOBACTAM 3.375 G IVPB 30 MIN
3.3750 g | Freq: Once | INTRAVENOUS | Status: AC
  Administered 2020-07-20: 3.375 g via INTRAVENOUS
  Filled 2020-07-20: qty 50

## 2020-07-20 MED ORDER — CLOPIDOGREL BISULFATE 75 MG PO TABS
75.0000 mg | ORAL_TABLET | Freq: Every day | ORAL | Status: DC
  Administered 2020-07-21 – 2020-07-27 (×7): 75 mg via ORAL
  Filled 2020-07-20 (×7): qty 1

## 2020-07-20 MED ORDER — ATORVASTATIN CALCIUM 10 MG PO TABS
20.0000 mg | ORAL_TABLET | Freq: Every day | ORAL | Status: DC
  Administered 2020-07-21 – 2020-07-27 (×7): 20 mg via ORAL
  Filled 2020-07-20 (×7): qty 2

## 2020-07-20 MED ORDER — SODIUM CHLORIDE 0.9 % IV BOLUS
500.0000 mL | Freq: Once | INTRAVENOUS | Status: AC
  Administered 2020-07-20: 500 mL via INTRAVENOUS

## 2020-07-20 MED ORDER — FUROSEMIDE 40 MG PO TABS
40.0000 mg | ORAL_TABLET | Freq: Every day | ORAL | Status: DC | PRN
  Administered 2020-07-22: 40 mg via ORAL
  Filled 2020-07-20: qty 1

## 2020-07-20 MED ORDER — GADOBUTROL 1 MMOL/ML IV SOLN
10.0000 mL | Freq: Once | INTRAVENOUS | Status: AC | PRN
  Administered 2020-07-20: 10 mL via INTRAVENOUS

## 2020-07-20 MED ORDER — VANCOMYCIN HCL 2000 MG/400ML IV SOLN
2000.0000 mg | Freq: Once | INTRAVENOUS | Status: AC
  Administered 2020-07-20: 2000 mg via INTRAVENOUS
  Filled 2020-07-20: qty 400

## 2020-07-20 MED ORDER — CARVEDILOL 3.125 MG PO TABS
3.1250 mg | ORAL_TABLET | Freq: Two times a day (BID) | ORAL | Status: DC
  Administered 2020-07-21 (×2): 3.125 mg via ORAL
  Filled 2020-07-20 (×2): qty 1

## 2020-07-20 NOTE — Progress Notes (Signed)
Subjective:  Patient ID: Austin Shaw, male    DOB: 1965/05/29,  MRN: [id number]  Chief Complaint  Patient presents with  . Wound Check    Worse than before    55 y.o. male presents for wound care.  Patient presents with a follow-up of right hallux ulceration probing down to deep tissue.  Patient has been on antibiotics doxycycline.  He states that he had a vascular procedure done.  He states that after the vascular procedure and the toe has gotten more dark discolored rated and worse.  He states there is a lot of malodor present to it.  There is redness associated with it.  He has been feeling some fever spikes at home subjective.  He does not have any fever spikes and his vitals are stable in clinic.  He denies any other acute complaints.  Review of Systems: Negative except as noted in the HPI. Denies N/V/F/Ch.  Past Medical History:  Diagnosis Date  . Allergy   . Diabetes mellitus    type 2  . Hyperlipidemia   . Hypertension   . Morbid obesity (HCC)    BMI 50  . Sleep apnea     Current Outpatient Medications:  .  aspirin EC 81 MG tablet, Take 1 tablet (81 mg total) by mouth daily. Swallow whole., Disp: , Rfl:  .  atorvastatin (LIPITOR) 40 MG tablet, Take 0.5 tablets (20 mg total) by mouth daily., Disp: , Rfl:  .  Blood Glucose Monitoring Suppl (ONE TOUCH ULTRA SYSTEM KIT) w/Device KIT, Check blood sugar twice a day and as directed. Dx E11.8, Disp: 1 each, Rfl: 0 .  carvedilol (COREG) 3.125 MG tablet, TAKE 1 TABLET (3.125 MG TOTAL) BY MOUTH 2 (TWO) TIMES DAILY WITH A MEAL., Disp: 180 tablet, Rfl: 0 .  clopidogrel (PLAVIX) 75 MG tablet, Take 1 tablet (75 mg total) by mouth daily., Disp: 30 tablet, Rfl: 6 .  doxycycline (VIBRA-TABS) 100 MG tablet, Take 1 tablet (100 mg total) by mouth 2 (two) times daily., Disp: 20 tablet, Rfl: 0 .  Dulaglutide (TRULICITY) 6.60 YT/0.1SW SOPN, Inject 0.75 mg into the skin once a week. Every Friday in the evening, Disp: , Rfl:  .  furosemide  (LASIX) 40 MG tablet, Take 1 tablet (40 mg total) by mouth daily as needed for fluid., Disp: , Rfl:  .  glucose blood test strip, CHECK BLOOD SUGAR TWICE DAILY, Disp: 100 each, Rfl: 3 .  losartan (COZAAR) 25 MG tablet, TAKE 1 TABLET BY MOUTH EVERY DAY, Disp: 90 tablet, Rfl: 1 .  metFORMIN (GLUCOPHAGE) 500 MG tablet, Take 500-1,000 mg by mouth See admin instructions. Take 1000 mg in the morning and 500 mg in the evening, Disp: , Rfl:  .  tobramycin (TOBREX) 0.3 % ophthalmic solution, Place 1 drop into both eyes See admin instructions. Every eight weeks after the eye injection, Disp: , Rfl:   Social History   Tobacco Use  Smoking Status Never Smoker  Smokeless Tobacco Never Used    Allergies  Allergen Reactions  . Lipitor [Atorvastatin]     intolerant   Objective:   Vitals:   07/20/20 1632  Temp: 98.9 F (37.2 C)   There is no height or weight on file to calculate BMI. Constitutional Well developed. Well nourished.  Vascular Dorsalis pedis pulses non palpable bilaterally. Posterior tibial pulses non palpable bilaterally. Capillary refill normal to all digits.  No cyanosis or clubbing noted. Pedal hair growth normal.  Neurologic Normal speech. Oriented to person, place,  and time. Protective sensation absent  Dermatologic Wound Location: Right hallux ulceration down to the level of the deep tissue Wound Base: Mixed Granular/Fibrotic Peri-wound: Calloused Exudate: Scant/small amount Serous exudate Wound Measurements: -See below  Orthopedic: No pain to palpation either foot.   Radiographs: 3 views of skeletally mature the right foot: No osteomyelitic changes noted.  No soft tissue emphysema noted.  Soft tissue defect was appreciated in x-ray.  No concern for cortical destruction at this time. Assessment:   1. Ulcer of great toe, right, with necrosis of muscle ([place])   2. PVD (peripheral vascular disease) ([place])   3. Gangrene ([place])   4. Cellulitis of right foot    Plan:   Patient was evaluated and treated and all questions answered.  Ulcer right hallux ulceration down to the level of the deep tissue/fascia with associated gangrene purulent drainage discoloration up to the midfoot -Debridement as below. -Dressed with Betadine wet-to-dry, DSD. -Continue off-loading with surgical shoe. -Today in clinic the wound has regressed considerably and is turning into her discoloration/gangrene with purulent drainage that was expressed in clinic.  He will likely need amputation of the hallux or the partial first ray.  At this time patient has an acute onset of gangrene. -He will need IV antibiotics and be admitted to the hospital.  He prefers to be over at [facility]. -He will need MRI of the foot right foot to rule out abscess as well as osteomyelitis. -He will need vascular work-up as well given that patient has a recent history of stent placement.  He will need a repeat ABIs PVRs with a possible evaluation to see if the stent is closed.  He is having an acute onset of gangrene as well with discoloration up to the midfoot. -I have immediately asked him to go to the emergency room for IV antibiotics and further work-up and to the vascular status as well as osteomyelitis of the right foot. -He is a high risk of losing his digit versus midfoot versus the leg given the nature of his uncontrolled A1c at 12% and the current onset of gangrene with cellulitis. No follow-ups on file.

## 2020-07-20 NOTE — H&P (Signed)
History and Physical   Austin Shaw GXQ:[id number] DOB: 10/01/1965 DOA: 07/20/2020  Referring MD/NP/PA: Dr. Francia Greaves  PCP: Tonia Ghent, MD   Outpatient Specialists: Dr. Posey Pronto podiatry  Patient coming from: Home  Chief Complaint: Right big toe infection  HPI: Austin Shaw is a 55 y.o. male with medical history significant of diabetes, hyperlipidemia, peripheral vascular disease, diabetic neuropathy and morbid obesity who was sent over by Dr. Posey Pronto the podiatrist after office visit notes for continued right foot ulcer management.  Patient has right hallux ulceration for which he went for follow-up.  He has been on doxycycline apparently and was having wound care.  Patient has known peripheral vascular disease and has had prior vascular surgery earlier this month on April 6.  He has abdominal aortogram with lower extremity runoff and revascularization of the right lower extremity.  Plan is to do follow-up ABI and LEA within 2 weeks of follow-up.  His right big toe has progressively gotten bad.  He has infection for which he was given the doxycycline.  Despite taking it it has gotten worse to the point where it looks gangrenosa at this point.  He was seen by podiatry therefore and sent over to the hospital for evaluation and possible surgery.  He denied any fever or chills.  Initial work-up and imaging indicated gangrene..  ED Course: Temperature is 99 blood pressure 182/83, pulse 94 respirate of 18 oxygen sat 96% on room air.  White count is 23.7 hemoglobin 11.7 and platelet count of 373.  Sodium 133 potassium 4.0 chloride 96 CO2 27 BUN 24 creatinine 1.63 calcium 9.1 glucose 180.  Lactic acid 2.1.  Plain x-ray of the right foot showed progressive soft tissue ulceration of the first digit with osteomyelitis of the first distal phalanx.  MRI of the foot ordered currently pending  Review of Systems: As per HPI otherwise 10 point review of systems negative.    Past Medical History:   Diagnosis Date  . Allergy   . Diabetes mellitus    type 2  . Hyperlipidemia   . Hypertension   . Morbid obesity (HCC)    BMI 50  . Sleep apnea     Past Surgical History:  Procedure Laterality Date  . ABDOMINAL AORTOGRAM W/LOWER EXTREMITY N/A 07/14/2020   Procedure: ABDOMINAL AORTOGRAM W/LOWER EXTREMITY;  Surgeon: Wellington Hampshire, MD;  Location: [facility];  Service: Cardiovascular;  Laterality: N/A;  . TONSILLECTOMY       reports that he has never smoked. He has never used smokeless tobacco. He reports that he does not drink alcohol and does not use drugs.  Allergies  Allergen Reactions  . Lipitor [Atorvastatin]     intolerant    Family History  Problem Relation Age of Onset  . Diabetes Father   . Diabetes Other        fam hx 1st degree relative  . Congestive Heart Failure Other   . Lupus Daughter   . Sjogren's syndrome Daughter   . Colon cancer Neg Hx   . Prostate cancer Neg Hx   . Esophageal cancer Neg Hx   . Rectal cancer Neg Hx      Prior to Admission medications   Medication Sig Start Date End Date Taking? Authorizing Provider  aspirin EC 81 MG tablet Take 1 tablet (81 mg total) by mouth daily. Swallow whole. 07/08/20   Wellington Hampshire, MD  atorvastatin (LIPITOR) 40 MG tablet Take 0.5 tablets (20 mg total) by mouth daily. 07/16/20  10/14/20  Tonia Ghent, MD  Blood Glucose Monitoring Suppl (ONE TOUCH ULTRA SYSTEM KIT) w/Device KIT Check blood sugar twice a day and as directed. Dx E11.8 08/01/16   Tonia Ghent, MD  carvedilol (COREG) 3.125 MG tablet TAKE 1 TABLET (3.125 MG TOTAL) BY MOUTH 2 (TWO) TIMES DAILY WITH A MEAL. 07/09/20   Tonia Ghent, MD  clopidogrel (PLAVIX) 75 MG tablet Take 1 tablet (75 mg total) by mouth daily. 07/14/20 07/14/21  Wellington Hampshire, MD  doxycycline (VIBRA-TABS) 100 MG tablet Take 1 tablet (100 mg total) by mouth 2 (two) times daily. 06/26/20   Felipa Furnace, DPM  Dulaglutide (TRULICITY) 0.08 QP/6.1PJ SOPN Inject 0.75 mg  into the skin once a week. Every Friday in the evening 07/16/20   Tonia Ghent, MD  furosemide (LASIX) 40 MG tablet Take 1 tablet (40 mg total) by mouth daily as needed for fluid. 07/16/20   Tonia Ghent, MD  glucose blood test strip CHECK BLOOD SUGAR TWICE DAILY 06/20/18   Tonia Ghent, MD  losartan (COZAAR) 25 MG tablet TAKE 1 TABLET BY MOUTH EVERY DAY 02/16/20   Tonia Ghent, MD  metFORMIN (GLUCOPHAGE) 500 MG tablet Take 500-1,000 mg by mouth See admin instructions. Take 1000 mg in the morning and 500 mg in the evening    [provider]  tobramycin (TOBREX) 0.3 % ophthalmic solution Place 1 drop into both eyes See admin instructions. Every eight weeks after the eye injection 01/10/19   [provider]    Physical Exam: Vitals:   07/20/20 1730 07/20/20 1828 07/20/20 2030 07/20/20 2036  BP: (!) 146/83  (!) 182/83 (!) 182/83  Pulse: 94   89  Resp: 18   18  Temp: 99 F (37.2 C)     SpO2: 97%   96%  Weight:  (!) 154.2 kg    Height:  $Remove'5\' 9"'WXhaoAK$  (1.753 m)        Constitutional: Morbidly obese in no significant distress Vitals:   07/20/20 1730 07/20/20 1828 07/20/20 2030 07/20/20 2036  BP: (!) 146/83  (!) 182/83 (!) 182/83  Pulse: 94   89  Resp: 18   18  Temp: 99 F (37.2 C)     SpO2: 97%   96%  Weight:  (!) 154.2 kg    Height:  $Remove'5\' 9"'fWTRvjB$  (1.753 m)     Eyes: PERRL, lids and conjunctivae normal ENMT: Mucous membranes are moist. Posterior pharynx clear of any exudate or lesions.Normal dentition.  Neck: normal, supple, no masses, no thyromegaly Respiratory: clear to auscultation bilaterally, no wheezing, no crackles. Normal respiratory effort. No accessory muscle use.  Cardiovascular: Regular rate and rhythm, no murmurs / rubs / gallops. No extremity edema. 2+ pedal pulses. No carotid bruits.  Abdomen: no tenderness, no masses palpated. No hepatosplenomegaly. Bowel sounds positive.  Musculoskeletal: no clubbing / cyanosis.  Right foot swollen with week 2 gangrene  nose malodorous with purulent discharge good ROM, no contractures. Normal muscle tone.  Skin: Necrotic skin on tissue over the right big toe and surrounding cellulitis and erythema, no rashes, lesions, ulcers. No induration Neurologic: CN 2-12 grossly intact. Sensation intact, DTR normal. Strength 5/5 in all 4.  Psychiatric: Normal judgment and insight. Alert and oriented x 3. Normal mood.     Labs on Admission: I have personally reviewed following labs and imaging studies  CBC: Recent Labs  Lab 07/20/20 1839  WBC 23.7*  NEUTROABS 19.2*  HGB 11.7*  HCT 36.8*  MCV 89.5  PLT 941   Basic Metabolic Panel: Recent Labs  Lab 07/20/20 1839  NA 133*  K 4.0  CL 96*  CO2 27  GLUCOSE 180*  BUN 24*  CREATININE 1.63*  CALCIUM 9.1   GFR: Estimated Creatinine Clearance: 77.2 mL/min (A) (by C-G formula based on SCr of 1.63 mg/dL (H)). Liver Function Tests: No results for input(s): AST, ALT, ALKPHOS, BILITOT, PROT, ALBUMIN in the last 168 hours. No results for input(s): LIPASE, AMYLASE in the last 168 hours. No results for input(s): AMMONIA in the last 168 hours. Coagulation Profile: No results for input(s): INR, PROTIME in the last 168 hours. Cardiac Enzymes: No results for input(s): CKTOTAL, CKMB, CKMBINDEX, TROPONINI in the last 168 hours. BNP (last 3 results) No results for input(s): PROBNP in the last 8760 hours. HbA1C: No results for input(s): HGBA1C in the last 72 hours. CBG: Recent Labs  Lab 07/14/20 0748 07/14/20 1253  GLUCAP 169* 163*   Lipid Profile: No results for input(s): CHOL, HDL, LDLCALC, TRIG, CHOLHDL, LDLDIRECT in the last 72 hours. Thyroid Function Tests: No results for input(s): TSH, T4TOTAL, FREET4, T3FREE, THYROIDAB in the last 72 hours. Anemia Panel: No results for input(s): VITAMINB12, FOLATE, FERRITIN, TIBC, IRON, RETICCTPCT in the last 72 hours. Urine analysis:    Component Value Date/Time   COLORURINE STRAW (A) 05/11/2018 1844   APPEARANCEUR  CLEAR 05/11/2018 1844   LABSPEC 1.013 05/11/2018 1844   PHURINE 5.0 05/11/2018 1844   GLUCOSEU >=500 (A) 05/11/2018 1844   HGBUR LARGE (A) 05/11/2018 1844   BILIRUBINUR NEGATIVE 05/11/2018 1844   [place] 05/11/2018 1844   PROTEINUR 30 (A) 05/11/2018 1844   NITRITE NEGATIVE 05/11/2018 1844   LEUKOCYTESUR NEGATIVE 05/11/2018 1844   Sepsis Labs: $RemoveBefo'@[id number]'efKRKfHFsnm$ (procalcitonin:4,lacticidven:4) ) Recent Results (from the past 240 hour(s))  SARS CORONAVIRUS 2 (TAT 6-24 HRS) Nasopharyngeal Nasopharyngeal Swab     Status: None   Collection Time: 07/12/20  8:18 AM   Specimen: Nasopharyngeal Swab  Result Value Ref Range Status   SARS Coronavirus 2 NEGATIVE NEGATIVE Final    Comment: (NOTE) SARS-CoV-2 target nucleic acids are NOT DETECTED.  The SARS-CoV-2 RNA is generally detectable in upper and lower respiratory specimens during the acute phase of infection. Negative results do not preclude SARS-CoV-2 infection, do not rule out co-infections with other pathogens, and should not be used as the sole basis for treatment or other patient management decisions. Negative results must be combined with clinical observations, patient history, and epidemiological information. The expected result is Negative.  Fact Sheet for Patients: SugarRoll.be  Fact Sheet for Healthcare Providers: https://www.woods-mathews.com/  This test is not yet approved or cleared by the [place] FDA and  has been authorized for detection and/or diagnosis of SARS-CoV-2 by FDA under an Emergency Use Authorization (EUA). This EUA will remain  in effect (meaning this test can be used) for the duration of the COVID-19 declaration under Se ction 564(b)(1) of the Act, 21 U.S.C. section 360bbb-3(b)(1), unless the authorization is terminated or revoked sooner.  Performed at [facility], [place] [address], [place], [place] [address]   SARS Coronavirus 2 by RT PCR  (hospital order, performed in [facility] hospital lab) Nasopharyngeal Nasopharyngeal Swab     Status: None   Collection Time: 07/14/20  7:51 AM   Specimen: Nasopharyngeal Swab  Result Value Ref Range Status   SARS Coronavirus 2 NEGATIVE NEGATIVE Final    Comment: (NOTE) SARS-CoV-2 target nucleic acids are NOT DETECTED.  The SARS-CoV-2 RNA  is generally detectable in upper and lower respiratory specimens during the acute phase of infection. The lowest concentration of SARS-CoV-2 viral copies this assay can detect is 250 copies / mL. A negative result does not preclude SARS-CoV-2 infection and should not be used as the sole basis for treatment or other patient management decisions.  A negative result may occur with improper specimen collection / handling, submission of specimen other than nasopharyngeal swab, presence of viral mutation(s) within the areas targeted by this assay, and inadequate number of viral copies (<250 copies / mL). A negative result must be combined with clinical observations, patient history, and epidemiological information.  Fact Sheet for Patients:   BoilerBrush.com.cy  Fact Sheet for Healthcare Providers: https://pope.com/  This test is not yet approved or  cleared by the [place] FDA and has been authorized for detection and/or diagnosis of SARS-CoV-2 by FDA under an Emergency Use Authorization (EUA).  This EUA will remain in effect (meaning this test can be used) for the duration of the COVID-19 declaration under Section 564(b)(1) of the Act, 21 U.S.C. section 360bbb-3(b)(1), unless the authorization is terminated or revoked sooner.  Performed at [facility], [address], [place], [place] [address]   Resp Panel by RT-PCR (Flu A&B, Covid) Nasopharyngeal Swab     Status: None   Collection Time: 07/20/20  6:30 PM   Specimen: Nasopharyngeal Swab; Nasopharyngeal(NP) swabs in vial transport medium   Result Value Ref Range Status   SARS Coronavirus 2 by RT PCR NEGATIVE NEGATIVE Final    Comment: (NOTE) SARS-CoV-2 target nucleic acids are NOT DETECTED.  The SARS-CoV-2 RNA is generally detectable in upper respiratory specimens during the acute phase of infection. The lowest concentration of SARS-CoV-2 viral copies this assay can detect is 138 copies/mL. A negative result does not preclude SARS-Cov-2 infection and should not be used as the sole basis for treatment or other patient management decisions. A negative result may occur with  improper specimen collection/handling, submission of specimen other than nasopharyngeal swab, presence of viral mutation(s) within the areas targeted by this assay, and inadequate number of viral copies(<138 copies/mL). A negative result must be combined with clinical observations, patient history, and epidemiological information. The expected result is Negative.  Fact Sheet for Patients:  BloggerCourse.com  Fact Sheet for Healthcare Providers:  SeriousBroker.it  This test is no t yet approved or cleared by the [place] FDA and  has been authorized for detection and/or diagnosis of SARS-CoV-2 by FDA under an Emergency Use Authorization (EUA). This EUA will remain  in effect (meaning this test can be used) for the duration of the COVID-19 declaration under Section 564(b)(1) of the Act, 21 U.S.C.section 360bbb-3(b)(1), unless the authorization is terminated  or revoked sooner.       Influenza A by PCR NEGATIVE NEGATIVE Final   Influenza B by PCR NEGATIVE NEGATIVE Final    Comment: (NOTE) The Xpert Xpress SARS-CoV-2/FLU/RSV plus assay is intended as an aid in the diagnosis of influenza from Nasopharyngeal swab specimens and should not be used as a sole basis for treatment. Nasal washings and aspirates are unacceptable for Xpert Xpress SARS-CoV-2/FLU/RSV testing.  Fact Sheet for  Patients: BloggerCourse.com  Fact Sheet for Healthcare Providers: SeriousBroker.it  This test is not yet approved or cleared by the [place] FDA and has been authorized for detection and/or diagnosis of SARS-CoV-2 by FDA under an Emergency Use Authorization (EUA). This EUA will remain in effect (meaning this test can be used) for the  duration of the COVID-19 declaration under Section 564(b)(1) of the Act, 21 U.S.C. section 360bbb-3(b)(1), unless the authorization is terminated or revoked.  Performed at [facility], [place] [address] [place], [place] [address]      Radiological Exams on Admission: DG Foot Complete Right  Result Date: 07/20/2020 CLINICAL DATA:  Right foot ulceration, pain EXAM: RIGHT FOOT COMPLETE - 3+ VIEW COMPARISON:  06/29/2020 FINDINGS: Frontal, oblique, and lateral views of the right foot are obtained. Soft tissue ulceration medial aspect first digit has progressed since prior study, with subcutaneous gas identified. Erosive changes are seen at the base of the first distal phalanx compatible with osteomyelitis. There are no acute displaced fractures. Diffuse vascular calcifications are noted. IMPRESSION: 1. Progressive soft tissue ulceration of the first digit, with osteomyelitis of the first distal phalanx. Electronically Signed   By: Randa Ngo M.D.   On: 07/20/2020 21:25      Assessment/Plan Principal Problem:   Gangrene of toe of right foot ([place]) Active Problems:   Diabetes mellitus type 2, uncontrolled, with complications (HCC)   OBESITY, MORBID   Essential hypertension   Cardiomyopathy ([place])   Chronic systolic heart failure (HCC)   PVD (peripheral vascular disease) (HCC)     #1 gangrene of the right big toe: Appears to be well advanced.  Patient had left patient admit the patient initiate antibiotics empirically with vancomycin and cefepime.  Podiatry to follow in the morning and make  decision.  We will keep patient n.p.o. after midnight.  #2 uncontrolled diabetes: Sliding scale insulin with home regimen.  Avoid Metformin.  #3 morbid obesity: Dietary counseling.  #4 essential hypertension: Continue blood pressure management.  #5 chronic systolic dysfunction CHF: Continue home regimen and monitor.  Avoid excessive fluids.  #6 leukocytosis: Secondary to cellulitis and gangrene.  Monitor white count on antibiotics.  #7 peripheral vascular disease: Continue per cardiology.   DVT prophylaxis: Lovenox Code Status: Full code Family Communication: No family at bedside Disposition Plan: To be determined Consults called: Dr. Posey Pronto, podiatry Admission status: Inpatient  Severity of Illness: The appropriate patient status for this patient is INPATIENT. Inpatient status is judged to be reasonable and necessary in order to provide the required intensity of service to ensure the patient's safety. The patient's presenting symptoms, physical exam findings, and initial radiographic and laboratory data in the context of their chronic comorbidities is felt to place them at high risk for further clinical deterioration. Furthermore, it is not anticipated that the patient will be medically stable for discharge from the hospital within 2 midnights of admission. The following factors support the patient status of inpatient.   " The patient's presenting symptoms include right big toe swelling and infection. " The worrisome physical exam findings include gangrenous right big toe. " The initial radiographic and laboratory data are worrisome because of leukocytosis. " The chronic co-morbidities include diabetes and peripheral vascular disease.   * I certify that at the point of admission it is my clinical judgment that the patient will require inpatient hospital care spanning beyond 2 midnights from the point of admission due to high intensity of service, high risk for further deterioration and  high frequency of surveillance required.Barbette Merino MD [facility] Pager [phone]  If 7PM-7AM, please contact night-coverage [email or web address] Password [facility]  07/20/2020, 11:20 PM

## 2020-07-20 NOTE — ED Triage Notes (Signed)
Pt had ulcer on foot starting 3.5wks ago, seen podiatrist, was sent to vascular for same. Pt went back to podiatrist today because wound has odor and has had increasing pain. Pt sent to ED from podiatrist for infection.

## 2020-07-20 NOTE — ED Triage Notes (Signed)
Emergency Medicine Provider Triage Evaluation Note  Trevar Boehringer , a 55 y.o. male  was evaluated in triage.  Pt complains of right great toe pain. Patient evaluated by Dr. Allena Katz with podiatry today and sent to the ED for admission and IV antibiotics. Patel recommends MRI right foot.   Review of Systems  Positive: wound   Physical Exam  BP (!) 146/83 (BP Location: Left Arm)   Pulse 94   Temp 99 F (37.2 C)   Resp 18   Ht 5\' 9"  (1.753 m)   Wt (!) 154.2 kg   SpO2 97%   BMI 50.21 kg/m  Gen:   Awake, no distress   HEENT:  Atraumatic  Resp:  Normal effort  Cardiac:  Normal rate Abd:   Nondistended, nontender  MSK:   Right foot in bandage Neuro:  Speech clear   Medical Decision Making  Medically screening exam initiated at 6:31 PM.  Appropriate orders placed.  Makar Brockel was informed that the remainder of the evaluation will be completed by another provider, this initial triage assessment does not replace that evaluation, and the importance of remaining in the ED until their evaluation is complete.  Clinical Impression  Ulcer right hallux ulceration down to the level of deep tissue/fascia with associated gangrene purulent drainage. MRI right foot ordered. Labs and COVID test for admission.   Hilda Blades, Mannie Stabile 07/20/20 [phone]

## 2020-07-20 NOTE — Progress Notes (Signed)
Pharmacy Antibiotic Note  Austin Shaw is a 55 y.o. male admitted on 07/20/2020 with wound infection .  Pharmacy has been consulted for vancomycin dosing.  Presenting with R great toe pain - sent in for MRI R foot and IV antibiotics. Has been on doxycycline. WBC 23.7, LA 1.4, Scr 1.63 (CrCl 77 mL/min). Afebrile.   Plan: Vancomycin 2g IV once then 1750 mg IV every 24 hours (estAUC 473) Received 1 dose of zosyn - could consider change to cefepime if continued to minimize AKI risk Monitor renal fx, cx results, clinical pic, and vanc levels as appropriate  Height: 5\' 9"  (175.3 cm) Weight: (!) 154.2 kg (340 lb) IBW/kg (Calculated) : 70.7  Temp (24hrs), Avg:99 F (37.2 C), Min:98.9 F (37.2 C), Max:99 F (37.2 C)  Recent Labs  Lab 07/20/20 1839  WBC 23.7*  CREATININE 1.63*  LATICACIDVEN 2.1*    Estimated Creatinine Clearance: 77.2 mL/min (A) (by C-G formula based on SCr of 1.63 mg/dL (H)).    Allergies  Allergen Reactions  . Lipitor [Atorvastatin]     intolerant    Antimicrobials this admission: Vancomycin 4/12 >>  Zosyn 4/12 x1  Dose adjustments this admission: N/A  Microbiology results: 4/12 BCx: sent  4/12 COVID PCR: sent   Thank you for allowing pharmacy to be a part of this patient's care.  6/12, PharmD, BCCCP Clinical Pharmacist  Phone: [phone] 07/20/2020 10:02 PM  Please check AMION for all [facility] phone numbers After 10:00 PM, call Main Pharmacy [phone]

## 2020-07-20 NOTE — ED Provider Notes (Signed)
[facility] EMERGENCY DEPARTMENT Provider Note   CSN: [id number] Arrival date & time: 07/20/20  1717     History Chief Complaint  Patient presents with  . Foot Pain    Austin Shaw is a 55 y.o. male.  55 year old male with prior medical history as detailed below presents for evaluation.  Patient sent to the ED for admission for IV antibiotics and likely surgical treatment of infection to the right great toe.  Patient is followed by podiatry.  Patient reports increasing infection to the right great toe over the last 3 weeks.  He reports that his podiatrist is concerned that his foot is not improving.  Patient reports that he has been told that he would likely require amputation.    The history is provided by the patient and medical records.  Illness Location:  Right foot infection Severity:  Severe Onset quality:  Gradual Duration:  3 weeks Timing:  Constant Progression:  Worsening Chronicity:  Chronic      Past Medical History:  Diagnosis Date  . Allergy   . Diabetes mellitus    type 2  . Hyperlipidemia   . Hypertension   . Morbid obesity (HCC)    BMI 50  . Sleep apnea     Patient Active Problem List   Diagnosis Date Noted  . PVD (peripheral vascular disease) ([place]) 07/20/2020  . Gangrene of toe of right foot ([place]) 07/20/2020  . Right lateral abdominal pain 03/23/2019  . Chronic systolic heart failure ([place]) 05/12/2018  . Advance care planning 11/20/2017  . Decreased cardiac ejection fraction 08/11/2014  . Cardiomyopathy ([place]) 08/11/2014  . SOB (shortness of breath) 08/06/2014  . Cough 06/11/2014  . Diabetic retinopathy ([place]) 03/22/2014  . Impotence 12/21/2010  . Routine general medical examination at a health care facility 10/16/2010  . OTHER MALAISE AND FATIGUE 11/11/2008  . OBESITY, MORBID 08/14/2008  . Diabetes mellitus type 2, uncontrolled, with complications ([place]) 40/11/6759  . HYPERLIPIDEMIA 12/26/2006  . Essential  hypertension 12/26/2006    Past Surgical History:  Procedure Laterality Date  . ABDOMINAL AORTOGRAM W/LOWER EXTREMITY N/A 07/14/2020   Procedure: ABDOMINAL AORTOGRAM W/LOWER EXTREMITY;  Surgeon: Wellington Hampshire, MD;  Location: [facility];  Service: Cardiovascular;  Laterality: N/A;  . TONSILLECTOMY         Family History  Problem Relation Age of Onset  . Diabetes Father   . Diabetes Other        fam hx 1st degree relative  . Congestive Heart Failure Other   . Lupus Daughter   . Sjogren's syndrome Daughter   . Colon cancer Neg Hx   . Prostate cancer Neg Hx   . Esophageal cancer Neg Hx   . Rectal cancer Neg Hx     Social History   Tobacco Use  . Smoking status: Never Smoker  . Smokeless tobacco: Never Used  Vaping Use  . Vaping Use: Never used  Substance Use Topics  . Alcohol use: No    Alcohol/week: 0.0 standard drinks  . Drug use: No    Home Medications Prior to Admission medications   Medication Sig Start Date End Date Taking? Authorizing Provider  aspirin EC 81 MG tablet Take 1 tablet (81 mg total) by mouth daily. Swallow whole. 07/08/20  Yes Wellington Hampshire, MD  atorvastatin (LIPITOR) 40 MG tablet Take 0.5 tablets (20 mg total) by mouth daily. 07/16/20 10/14/20 Yes Tonia Ghent, MD  Blood Glucose Monitoring Suppl (ONE TOUCH ULTRA SYSTEM KIT)  w/Device KIT Check blood sugar twice a day and as directed. Dx E11.8 08/01/16  Yes Tonia Ghent, MD  carvedilol (COREG) 3.125 MG tablet TAKE 1 TABLET (3.125 MG TOTAL) BY MOUTH 2 (TWO) TIMES DAILY WITH A MEAL. Patient taking differently: Take 3.125 mg by mouth 2 (two) times daily with a meal. 07/09/20  Yes Tonia Ghent, MD  clopidogrel (PLAVIX) 75 MG tablet Take 1 tablet (75 mg total) by mouth daily. 07/14/20 07/14/21 Yes Wellington Hampshire, MD  doxycycline (VIBRA-TABS) 100 MG tablet Take 1 tablet (100 mg total) by mouth 2 (two) times daily. 06/26/20  Yes Felipa Furnace, DPM  Dulaglutide (TRULICITY) 3.30 QT/6.2UQ SOPN  Inject 0.75 mg into the skin once a week. Every Friday in the evening 07/16/20  Yes Tonia Ghent, MD  furosemide (LASIX) 40 MG tablet Take 1 tablet (40 mg total) by mouth daily as needed for fluid. 07/16/20  Yes Tonia Ghent, MD  glucose blood test strip CHECK BLOOD SUGAR TWICE DAILY 06/20/18  Yes Tonia Ghent, MD  losartan (COZAAR) 25 MG tablet TAKE 1 TABLET BY MOUTH EVERY DAY 02/16/20  Yes Tonia Ghent, MD  metFORMIN (GLUCOPHAGE) 500 MG tablet Take 500-1,000 mg by mouth See admin instructions. Take 1000 mg in the morning and 500 mg in the evening   Yes [provider]    Allergies    Lipitor [atorvastatin]  Review of Systems   Review of Systems  All other systems reviewed and are negative.   Physical Exam Updated Vital Signs BP (!) 182/83   Pulse 89   Temp 99 F (37.2 C)   Resp 18   Ht 5' 9"  (1.753 m)   Wt (!) 154.2 kg   SpO2 96%   BMI 50.21 kg/m   Physical Exam Vitals and nursing note reviewed.  Constitutional:      General: He is not in acute distress.    Appearance: Normal appearance. He is well-developed.  HENT:     Head: Normocephalic and atraumatic.  Eyes:     Conjunctiva/sclera: Conjunctivae normal.     Pupils: Pupils are equal, round, and reactive to light.  Cardiovascular:     Rate and Rhythm: Normal rate and regular rhythm.     Heart sounds: Normal heart sounds.  Pulmonary:     Effort: Pulmonary effort is normal. No respiratory distress.     Breath sounds: Normal breath sounds.  Abdominal:     General: There is no distension.     Palpations: Abdomen is soft.     Tenderness: There is no abdominal tenderness.  Musculoskeletal:        General: No deformity. Normal range of motion.     Cervical back: Normal range of motion and neck supple.  Skin:    General: Skin is warm and dry.     Comments: Gangrenous right great toe  Neurological:     General: No focal deficit present.     Mental Status: He is alert and oriented to person, place,  and time. Mental status is at baseline.     ED Results / Procedures / Treatments   Labs (all labs ordered are listed, but only abnormal results are displayed) Labs Reviewed  CBC WITH DIFFERENTIAL/PLATELET - Abnormal; Notable for the following components:      Result Value   WBC 23.7 (*)    RBC 4.11 (*)    Hemoglobin 11.7 (*)    HCT 36.8 (*)    Neutro Abs 19.2 (*)  Monocytes Absolute 1.5 (*)    Abs Immature Granulocytes 0.34 (*)    All other components within normal limits  BASIC METABOLIC PANEL - Abnormal; Notable for the following components:   Sodium 133 (*)    Chloride 96 (*)    Glucose, Bld 180 (*)    BUN 24 (*)    Creatinine, Ser 1.63 (*)    GFR, Estimated 50 (*)    All other components within normal limits  LACTIC ACID, PLASMA - Abnormal; Notable for the following components:   Lactic Acid, Venous 2.1 (*)    All other components within normal limits  RESP PANEL BY RT-PCR (FLU A&B, COVID) ARPGX2  CULTURE, BLOOD (ROUTINE X 2)  CULTURE, BLOOD (ROUTINE X 2)  LACTIC ACID, PLASMA    EKG None  Radiology DG Foot Complete Right  Result Date: 07/20/2020 CLINICAL DATA:  Right foot ulceration, pain EXAM: RIGHT FOOT COMPLETE - 3+ VIEW COMPARISON:  06/29/2020 FINDINGS: Frontal, oblique, and lateral views of the right foot are obtained. Soft tissue ulceration medial aspect first digit has progressed since prior study, with subcutaneous gas identified. Erosive changes are seen at the base of the first distal phalanx compatible with osteomyelitis. There are no acute displaced fractures. Diffuse vascular calcifications are noted. IMPRESSION: 1. Progressive soft tissue ulceration of the first digit, with osteomyelitis of the first distal phalanx. Electronically Signed   By: Randa Ngo M.D.   On: 07/20/2020 21:25    Procedures Procedures   Medications Ordered in ED Medications  vancomycin (VANCOREADY) IVPB 2000 mg/400 mL (has no administration in time range)  vancomycin  (VANCOCIN) 1,750 mg in sodium chloride 0.9 % 500 mL IVPB (has no administration in time range)  piperacillin-tazobactam (ZOSYN) IVPB 3.375 g (0 g Intravenous Stopped 07/20/20 2211)  sodium chloride 0.9 % bolus 500 mL (500 mLs Intravenous New Bag/Given 07/20/20 2121)  gadobutrol (GADAVIST) 1 MMOL/ML injection 10 mL (10 mLs Intravenous Contrast Given 07/20/20 2219)    ED Course  I have reviewed the triage vital signs and the nursing notes.  Pertinent labs & imaging results that were available during my care of the patient were reviewed by me and considered in my medical decision making (see chart for details).    MDM Rules/Calculators/A&P                          MDM  MSE complete  Austin Shaw was evaluated in Emergency Department on 07/20/2020 for the symptoms described in the history of present illness. He was evaluated in the context of the global COVID-19 pandemic, which necessitated consideration that the patient might be at risk for infection with the SARS-CoV-2 virus that causes COVID-19. Institutional protocols and algorithms that pertain to the evaluation of patients at risk for COVID-19 are in a state of rapid change based on information released by regulatory bodies including the CDC and federal and state organizations. These policies and algorithms were followed during the patient's care in the ED.  Patient is presenting for admission for IV antibiotics and likely amputation of the right great toe.  Hospital services aware of case and will evaluate for admission. Final Clinical Impression(s) / ED Diagnoses Final diagnoses:  Foot infection    Rx / DC Orders ED Discharge Orders    None       Valarie Merino, MD 07/20/20 2304

## 2020-07-21 ENCOUNTER — Encounter (HOSPITAL_COMMUNITY)
Admission: EM | Disposition: A | Discharge status: Home Health | Payer: Self-pay | Source: Home / Self Care | Attending: Internal Medicine

## 2020-07-21 ENCOUNTER — Ambulatory Visit: Payer: Self-pay | Admitting: Podiatry

## 2020-07-21 ENCOUNTER — Telehealth: Payer: Self-pay | Admitting: Podiatry

## 2020-07-21 ENCOUNTER — Inpatient Hospital Stay: Payer: BC Managed Care – PPO

## 2020-07-21 ENCOUNTER — Inpatient Hospital Stay (HOSPITAL_COMMUNITY): Payer: BC Managed Care – PPO | Admitting: Anesthesiology

## 2020-07-21 ENCOUNTER — Inpatient Hospital Stay (HOSPITAL_COMMUNITY): Payer: BC Managed Care – PPO

## 2020-07-21 DIAGNOSIS — I10 Essential (primary) hypertension: Secondary | ICD-10-CM

## 2020-07-21 DIAGNOSIS — I96 Gangrene, not elsewhere classified: Secondary | ICD-10-CM

## 2020-07-21 DIAGNOSIS — I739 Peripheral vascular disease, unspecified: Secondary | ICD-10-CM

## 2020-07-21 DIAGNOSIS — M86671 Other chronic osteomyelitis, right ankle and foot: Secondary | ICD-10-CM

## 2020-07-21 DIAGNOSIS — L0390 Cellulitis, unspecified: Secondary | ICD-10-CM

## 2020-07-21 HISTORY — PX: IRRIGATION AND DEBRIDEMENT FOOT: SHX6602

## 2020-07-21 HISTORY — PX: AMPUTATION: SHX166

## 2020-07-21 LAB — GLUCOSE, CAPILLARY
Glucose-Capillary: 103 mg/dL — ABNORMAL HIGH (ref 70–99)
Glucose-Capillary: 117 mg/dL — ABNORMAL HIGH (ref 70–99)
Glucose-Capillary: 117 mg/dL — ABNORMAL HIGH (ref 70–99)
Glucose-Capillary: 124 mg/dL — ABNORMAL HIGH (ref 70–99)
Glucose-Capillary: 126 mg/dL — ABNORMAL HIGH (ref 70–99)
Glucose-Capillary: 165 mg/dL — ABNORMAL HIGH (ref 70–99)
Glucose-Capillary: 177 mg/dL — ABNORMAL HIGH (ref 70–99)

## 2020-07-21 LAB — CBC
HCT: 35.3 % — ABNORMAL LOW (ref 39.0–52.0)
Hemoglobin: 11.1 g/dL — ABNORMAL LOW (ref 13.0–17.0)
MCH: 28.6 pg (ref 26.0–34.0)
MCHC: 31.4 g/dL (ref 30.0–36.0)
MCV: 91 fL (ref 80.0–100.0)
Platelets: 370 10*3/uL (ref 150–400)
RBC: 3.88 MIL/uL — ABNORMAL LOW (ref 4.22–5.81)
RDW: 13.2 % (ref 11.5–15.5)
WBC: 19.9 10*3/uL — ABNORMAL HIGH (ref 4.0–10.5)
nRBC: 0 % (ref 0.0–0.2)

## 2020-07-21 LAB — COMPREHENSIVE METABOLIC PANEL
ALT: 16 U/L (ref 0–44)
AST: 20 U/L (ref 15–41)
Albumin: 2.4 g/dL — ABNORMAL LOW (ref 3.5–5.0)
Alkaline Phosphatase: 70 U/L (ref 38–126)
Anion gap: 11 (ref 5–15)
BUN: 23 mg/dL — ABNORMAL HIGH (ref 6–20)
CO2: 27 mmol/L (ref 22–32)
Calcium: 8.8 mg/dL — ABNORMAL LOW (ref 8.9–10.3)
Chloride: 96 mmol/L — ABNORMAL LOW (ref 98–111)
Creatinine, Ser: 1.45 mg/dL — ABNORMAL HIGH (ref 0.61–1.24)
GFR, Estimated: 58 mL/min — ABNORMAL LOW (ref >60)
Glucose, Bld: 172 mg/dL — ABNORMAL HIGH (ref 70–99)
Potassium: 4.1 mmol/L (ref 3.5–5.1)
Sodium: 134 mmol/L — ABNORMAL LOW (ref 135–145)
Total Bilirubin: 1 mg/dL (ref 0.3–1.2)
Total Protein: 7.6 g/dL (ref 6.5–8.1)

## 2020-07-21 LAB — SURGICAL PCR SCREEN
MRSA, PCR: NEGATIVE
Staphylococcus aureus: NEGATIVE

## 2020-07-21 LAB — HIV ANTIBODY (ROUTINE TESTING W REFLEX): HIV Screen 4th Generation wRfx: NONREACTIVE

## 2020-07-21 SURGERY — AMPUTATION, FOOT, RAY
Anesthesia: Monitor Anesthesia Care | Site: Toe | Laterality: Right

## 2020-07-21 MED ORDER — FENTANYL CITRATE (PF) 100 MCG/2ML IJ SOLN
INTRAMUSCULAR | Status: DC | PRN
  Administered 2020-07-21 (×2): 25 ug via INTRAVENOUS

## 2020-07-21 MED ORDER — OXYCODONE HCL 5 MG/5ML PO SOLN: 5.0000 mg | Freq: Once | ORAL | Status: DC | PRN

## 2020-07-21 MED ORDER — HYDRALAZINE HCL 20 MG/ML IJ SOLN
10.0000 mg | Freq: Four times a day (QID) | INTRAMUSCULAR | Status: DC | PRN
  Administered 2020-07-21 – 2020-07-26 (×6): 10 mg via INTRAVENOUS
  Filled 2020-07-21 (×7): qty 1

## 2020-07-21 MED ORDER — VANCOMYCIN HCL 1 G IV SOLR
INTRAVENOUS | Status: DC | PRN
  Administered 2020-07-21: 1000 mg

## 2020-07-21 MED ORDER — PROPOFOL 10 MG/ML IV BOLUS
INTRAVENOUS | Status: DC | PRN
  Administered 2020-07-21: 50 mg via INTRAVENOUS

## 2020-07-21 MED ORDER — PHENYLEPHRINE 40 MCG/ML (10ML) SYRINGE FOR IV PUSH (FOR BLOOD PRESSURE SUPPORT)
PREFILLED_SYRINGE | INTRAVENOUS | Status: AC
  Filled 2020-07-21: qty 40

## 2020-07-21 MED ORDER — VANCOMYCIN HCL 1750 MG/350ML IV SOLN
1750.0000 mg | INTRAVENOUS | Status: DC
  Administered 2020-07-21: 1750 mg via INTRAVENOUS
  Filled 2020-07-21 (×2): qty 350

## 2020-07-21 MED ORDER — FENTANYL CITRATE (PF) 250 MCG/5ML IJ SOLN
INTRAMUSCULAR | Status: AC
  Filled 2020-07-21: qty 5

## 2020-07-21 MED ORDER — LIDOCAINE 2% (20 MG/ML) 5 ML SYRINGE
INTRAMUSCULAR | Status: AC
  Filled 2020-07-21: qty 5

## 2020-07-21 MED ORDER — HEMOSTATIC AGENTS (NO CHARGE) OPTIME
TOPICAL | Status: DC | PRN
  Administered 2020-07-21: 1

## 2020-07-21 MED ORDER — LIDOCAINE 2% (20 MG/ML) 5 ML SYRINGE
INTRAMUSCULAR | Status: DC | PRN
  Administered 2020-07-21: 60 mg via INTRAVENOUS

## 2020-07-21 MED ORDER — MORPHINE SULFATE (PF) 2 MG/ML IV SOLN
2.0000 mg | INTRAVENOUS | Status: DC | PRN
  Administered 2020-07-21 – 2020-07-26 (×29): 2 mg via INTRAVENOUS
  Filled 2020-07-21 (×30): qty 1

## 2020-07-21 MED ORDER — ONDANSETRON HCL 4 MG/2ML IJ SOLN
4.0000 mg | Freq: Four times a day (QID) | INTRAMUSCULAR | Status: DC | PRN
  Administered 2020-07-21: 4 mg via INTRAVENOUS
  Filled 2020-07-21 (×2): qty 2

## 2020-07-21 MED ORDER — ONDANSETRON HCL 4 MG PO TABS: 4.0000 mg | ORAL_TABLET | Freq: Four times a day (QID) | ORAL | Status: DC | PRN

## 2020-07-21 MED ORDER — ENOXAPARIN SODIUM 80 MG/0.8ML ~~LOC~~ SOLN
70.0000 mg | SUBCUTANEOUS | Status: DC
  Administered 2020-07-22 – 2020-07-27 (×5): 70 mg via SUBCUTANEOUS
  Filled 2020-07-21 (×5): qty 0.8

## 2020-07-21 MED ORDER — SODIUM CHLORIDE 0.9 % IV SOLN
3.0000 g | Freq: Three times a day (TID) | INTRAVENOUS | Status: DC
  Administered 2020-07-21 – 2020-07-23 (×7): 3 g via INTRAVENOUS
  Filled 2020-07-21: qty 3
  Filled 2020-07-21 (×2): qty 8
  Filled 2020-07-21: qty 3
  Filled 2020-07-21: qty 8
  Filled 2020-07-21: qty 3
  Filled 2020-07-21: qty 8
  Filled 2020-07-21: qty 3
  Filled 2020-07-21: qty 8
  Filled 2020-07-21 (×2): qty 3

## 2020-07-21 MED ORDER — FENTANYL CITRATE (PF) 100 MCG/2ML IJ SOLN
INTRAMUSCULAR | Status: AC
  Filled 2020-07-21: qty 2

## 2020-07-21 MED ORDER — SODIUM CHLORIDE 0.9 % IR SOLN
Status: DC | PRN
  Administered 2020-07-21: 3000 mL

## 2020-07-21 MED ORDER — INSULIN ASPART 100 UNIT/ML ~~LOC~~ SOLN
0.0000 [IU] | Freq: Three times a day (TID) | SUBCUTANEOUS | Status: DC
  Administered 2020-07-21: 3 [IU] via SUBCUTANEOUS
  Administered 2020-07-21: 2 [IU] via SUBCUTANEOUS
  Administered 2020-07-22: 3 [IU] via SUBCUTANEOUS
  Administered 2020-07-22: 2 [IU] via SUBCUTANEOUS
  Administered 2020-07-22 – 2020-07-23 (×3): 3 [IU] via SUBCUTANEOUS
  Administered 2020-07-24: 2 [IU] via SUBCUTANEOUS
  Administered 2020-07-24 – 2020-07-25 (×4): 3 [IU] via SUBCUTANEOUS
  Administered 2020-07-25 – 2020-07-26 (×2): 2 [IU] via SUBCUTANEOUS
  Administered 2020-07-26: 3 [IU] via SUBCUTANEOUS
  Administered 2020-07-26: 2 [IU] via SUBCUTANEOUS
  Administered 2020-07-27: 5 [IU] via SUBCUTANEOUS
  Administered 2020-07-27: 2 [IU] via SUBCUTANEOUS

## 2020-07-21 MED ORDER — LACTATED RINGERS IV SOLN: INTRAVENOUS | Status: DC

## 2020-07-21 MED ORDER — CARVEDILOL 6.25 MG PO TABS
6.2500 mg | ORAL_TABLET | Freq: Two times a day (BID) | ORAL | Status: DC
  Administered 2020-07-22 – 2020-07-25 (×7): 6.25 mg via ORAL
  Filled 2020-07-21 (×7): qty 1

## 2020-07-21 MED ORDER — PHENYLEPHRINE 40 MCG/ML (10ML) SYRINGE FOR IV PUSH (FOR BLOOD PRESSURE SUPPORT)
PREFILLED_SYRINGE | INTRAVENOUS | Status: DC | PRN
  Administered 2020-07-21: 120 ug via INTRAVENOUS

## 2020-07-21 MED ORDER — BUPIVACAINE HCL 0.5 % IJ SOLN
INTRAMUSCULAR | Status: AC
  Filled 2020-07-21: qty 1

## 2020-07-21 MED ORDER — ENOXAPARIN SODIUM 40 MG/0.4ML ~~LOC~~ SOLN
40.0000 mg | SUBCUTANEOUS | Status: DC
  Administered 2020-07-21: 40 mg via SUBCUTANEOUS
  Filled 2020-07-21: qty 0.4

## 2020-07-21 MED ORDER — LACTATED RINGERS IV SOLN: INTRAVENOUS | Status: DC | PRN

## 2020-07-21 MED ORDER — ACETAMINOPHEN 325 MG PO TABS
650.0000 mg | ORAL_TABLET | Freq: Four times a day (QID) | ORAL | Status: DC | PRN
  Administered 2020-07-21 – 2020-07-22 (×3): 650 mg via ORAL
  Filled 2020-07-21 (×5): qty 2

## 2020-07-21 MED ORDER — ONDANSETRON HCL 4 MG/2ML IJ SOLN
INTRAMUSCULAR | Status: AC
  Filled 2020-07-21: qty 2

## 2020-07-21 MED ORDER — AMISULPRIDE (ANTIEMETIC) 5 MG/2ML IV SOLN: 10.0000 mg | Freq: Once | INTRAVENOUS | Status: DC | PRN

## 2020-07-21 MED ORDER — ONDANSETRON HCL 4 MG/2ML IJ SOLN
INTRAMUSCULAR | Status: DC | PRN
  Administered 2020-07-21: 4 mg via INTRAVENOUS

## 2020-07-21 MED ORDER — VANCOMYCIN HCL 1000 MG IV SOLR
INTRAVENOUS | Status: AC
  Filled 2020-07-21: qty 1000

## 2020-07-21 MED ORDER — BUPIVACAINE HCL 0.5 % IJ SOLN
INTRAMUSCULAR | Status: DC | PRN
  Administered 2020-07-21: 10 mL

## 2020-07-21 MED ORDER — INSULIN ASPART 100 UNIT/ML ~~LOC~~ SOLN
0.0000 [IU] | Freq: Every day | SUBCUTANEOUS | Status: DC
  Administered 2020-07-23: 2 [IU] via SUBCUTANEOUS

## 2020-07-21 MED ORDER — OXYCODONE HCL 5 MG PO TABS: 5.0000 mg | ORAL_TABLET | Freq: Once | ORAL | Status: DC | PRN

## 2020-07-21 MED ORDER — FENTANYL CITRATE (PF) 100 MCG/2ML IJ SOLN
25.0000 ug | INTRAMUSCULAR | Status: DC | PRN
  Administered 2020-07-21: 50 ug via INTRAVENOUS

## 2020-07-21 MED ORDER — PROPOFOL 500 MG/50ML IV EMUL
INTRAVENOUS | Status: DC | PRN
  Administered 2020-07-21: 100 ug/kg/min via INTRAVENOUS

## 2020-07-21 MED ORDER — PROPOFOL 10 MG/ML IV BOLUS
INTRAVENOUS | Status: AC
  Filled 2020-07-21: qty 20

## 2020-07-21 MED ORDER — 0.9 % SODIUM CHLORIDE (POUR BTL) OPTIME
TOPICAL | Status: DC | PRN
  Administered 2020-07-21: 1000 mL

## 2020-07-21 SURGICAL SUPPLY — 34 items
BLADE LONG MED 31X9 (MISCELLANEOUS) ×2 IMPLANT
BNDG ELASTIC 4X5.8 VLCR STR LF (GAUZE/BANDAGES/DRESSINGS) ×2 IMPLANT
BNDG GAUZE ELAST 4 BULKY (GAUZE/BANDAGES/DRESSINGS) ×2 IMPLANT
COVER SURGICAL LIGHT HANDLE (MISCELLANEOUS) ×2 IMPLANT
COVER WAND RF STERILE (DRAPES) ×2 IMPLANT
DECANTER SPIKE VIAL GLASS SM (MISCELLANEOUS) ×2 IMPLANT
DRSG EMULSION OIL 3X3 NADH (GAUZE/BANDAGES/DRESSINGS) ×2 IMPLANT
ELECT REM PT RETURN 9FT ADLT (ELECTROSURGICAL) ×2
ELECTRODE REM PT RTRN 9FT ADLT (ELECTROSURGICAL) ×1 IMPLANT
GAUZE PACKING IODOFORM 1/2 (PACKING) ×2 IMPLANT
GAUZE SPONGE 4X4 12PLY STRL (GAUZE/BANDAGES/DRESSINGS) ×2 IMPLANT
GLOVE BIO SURGEON STRL SZ7.5 (GLOVE) ×2 IMPLANT
GLOVE SRG 8 PF TXTR STRL LF DI (GLOVE) ×1 IMPLANT
GLOVE SURG UNDER POLY LF SZ8 (GLOVE) ×1
GOWN STRL REUS W/ TWL LRG LVL3 (GOWN DISPOSABLE) ×1 IMPLANT
GOWN STRL REUS W/ TWL XL LVL3 (GOWN DISPOSABLE) ×1 IMPLANT
GOWN STRL REUS W/TWL LRG LVL3 (GOWN DISPOSABLE) ×1
GOWN STRL REUS W/TWL XL LVL3 (GOWN DISPOSABLE) ×1
HANDPIECE INTERPULSE COAX TIP (DISPOSABLE) ×1
HEMOSTAT SURGICEL 2X14 (HEMOSTASIS) ×2 IMPLANT
KIT BASIN OR (CUSTOM PROCEDURE TRAY) ×2 IMPLANT
KIT TURNOVER KIT B (KITS) ×2 IMPLANT
NEEDLE HYPO 25GX1X1/2 BEV (NEEDLE) ×2 IMPLANT
NS IRRIG 1000ML POUR BTL (IV SOLUTION) ×2 IMPLANT
PACK ORTHO EXTREMITY (CUSTOM PROCEDURE TRAY) ×2 IMPLANT
PAD ARMBOARD 7.5X6 YLW CONV (MISCELLANEOUS) ×2 IMPLANT
SET HNDPC FAN SPRY TIP SCT (DISPOSABLE) ×1 IMPLANT
SOL PREP POV-IOD 4OZ 10% (MISCELLANEOUS) ×4 IMPLANT
SUT ETHILON 2 0 FS 18 (SUTURE) ×2 IMPLANT
SUT ETHILON 3 0 PS 1 (SUTURE) ×2 IMPLANT
SYR CONTROL 10ML LL (SYRINGE) ×2 IMPLANT
TOWEL GREEN STERILE (TOWEL DISPOSABLE) ×2 IMPLANT
TUBE CONNECTING 12X1/4 (SUCTIONS) ×2 IMPLANT
YANKAUER SUCT BULB TIP NO VENT (SUCTIONS) ×2 IMPLANT

## 2020-07-21 NOTE — Progress Notes (Signed)
Patient arrived to 6N31, alert and oriented x4,abel to make needs known. No acute distress noted. No SOB/DOB noted. V/S stable. 8/10pain to R foot-PRN pain med given. Patient was oriented to call bell and surroudings. Currently NPO except for Sips with Meds.   Will continue to monitor.

## 2020-07-21 NOTE — ED Notes (Signed)
Attempted to call report X 1. 

## 2020-07-21 NOTE — Transfer of Care (Signed)
Immediate Anesthesia Transfer of Care Note  Patient: Isael Knisley  Procedure(s) Performed: AMPUTATION RAY (Right )  Patient Location: PACU  Anesthesia Type:MAC combined with regional for post-op pain  Level of Consciousness: awake, alert  and oriented  Airway & Oxygen Therapy: Patient Spontanous Breathing  Post-op Assessment: Report given to RN, Post -op Vital signs reviewed and stable and Patient moving all extremities  Post vital signs: Reviewed and stable  Last Vitals:  Vitals Value Taken Time  BP 145/87 07/21/20 2203  Temp    Pulse 95 07/21/20 2206  Resp 17 07/21/20 2206  SpO2 95 % 07/21/20 2206  Vitals shown include unvalidated device data.  Last Pain:  Vitals:   07/21/20 2017  TempSrc:   PainSc: 10-Worst pain ever      Patients Stated Pain Goal: 0 (42/76/70 1100)  Complications: No complications documented.

## 2020-07-21 NOTE — Anesthesia Preprocedure Evaluation (Addendum)
Anesthesia Evaluation  Patient identified by MRN, date of birth, ID band Patient awake    Reviewed: Allergy & Precautions, NPO status , Patient's Chart, lab work & pertinent test results  Airway Mallampati: III  TM Distance: >3 FB Neck ROM: Full    Dental  (+) Dental Advisory Given   Pulmonary sleep apnea ,    breath sounds clear to auscultation       Cardiovascular hypertension, Pt. on medications and Pt. on home beta blockers + Peripheral Vascular Disease   Rhythm:Regular Rate:Normal     Neuro/Psych negative neurological ROS     GI/Hepatic negative GI ROS, Neg liver ROS,   Endo/Other  diabetesMorbid obesity  Renal/GU Renal InsufficiencyRenal disease     Musculoskeletal   Abdominal   Peds  Hematology  (+) anemia ,   Anesthesia Other Findings   Reproductive/Obstetrics                             Anesthesia Physical Anesthesia Plan  ASA: III  Anesthesia Plan:    Post-op Pain Management:    Induction:   PONV Risk Score and Plan:   Airway Management Planned:   Additional Equipment:   Intra-op Plan:   Post-operative Plan:   Informed Consent:   Plan Discussed with:   Anesthesia Plan Comments:         Anesthesia Quick Evaluation

## 2020-07-21 NOTE — Anesthesia Procedure Notes (Signed)
Procedure Name: MAC Date/Time: 07/21/2020 9:10 PM Performed by: Alain Marion, CRNA Pre-anesthesia Checklist: Patient identified, Emergency Drugs available, Suction available and Patient being monitored Oxygen Delivery Method: Simple face mask Placement Confirmation: positive ETCO2

## 2020-07-21 NOTE — Progress Notes (Signed)
Lower extremity arterial RT study completed.  Preliminary results relayed to Chestine Spore, MD.   See CV Proc for preliminary results report.   Jean Rosenthal, RDMS, RVT

## 2020-07-21 NOTE — Progress Notes (Signed)
I was notified of patient's admission by Dr. Allena Katz (Podiatry). I reviewed the chart and discussed with Dr. Allena Katz. I am off this week and thus, I consulted Dr. Chestine Spore from vascular surgery and discussed the case with him.

## 2020-07-21 NOTE — Progress Notes (Signed)
Seen in pre-op, denies interval changes since seen earlier today.

## 2020-07-21 NOTE — Telephone Encounter (Signed)
Called patient's wife for post-op update 

## 2020-07-21 NOTE — Consult Note (Signed)
Reason for Consult: Right foot infection, osteomyelitis Referring Physician: Gurkaran Rahm is an 55 y.o. male.  HPI: 55 year old male with diabetes presents with ulceration to the right great toe.  States that he noticed it mid-March.  States it has worsened up until this time.  Underwent angioplasty with stenting on 07/14/2020.  During this admission he was evaluated by vascular surgery.  No further intervention is planned.  Past Medical History:  Diagnosis Date  . Allergy   . Diabetes mellitus    type 2  . Hyperlipidemia   . Hypertension   . Morbid obesity (HCC)    BMI 50  . Sleep apnea     Past Surgical History:  Procedure Laterality Date  . ABDOMINAL AORTOGRAM W/LOWER EXTREMITY N/A 07/14/2020   Procedure: ABDOMINAL AORTOGRAM W/LOWER EXTREMITY;  Surgeon: Iran Ouch, MD;  Location: [facility];  Service: Cardiovascular;  Laterality: N/A;  . TONSILLECTOMY      Family History  Problem Relation Age of Onset  . Diabetes Father   . Diabetes Other        fam hx 1st degree relative  . Congestive Heart Failure Other   . Lupus Daughter   . Sjogren's syndrome Daughter   . Colon cancer Neg Hx   . Prostate cancer Neg Hx   . Esophageal cancer Neg Hx   . Rectal cancer Neg Hx     Social History:  reports that he has never smoked. He has never used smokeless tobacco. He reports that he does not drink alcohol and does not use drugs.  Allergies:  Allergies  Allergen Reactions  . Lipitor [Atorvastatin]     intolerant    Medications: I have reviewed the patient's current medications.  Results for orders placed or performed during the hospital encounter of 07/20/20 (from the past 48 hour(s))  Resp Panel by RT-PCR (Flu A&B, Covid) Nasopharyngeal Swab     Status: None   Collection Time: 07/20/20  6:30 PM   Specimen: Nasopharyngeal Swab; Nasopharyngeal(NP) swabs in vial transport medium  Result Value Ref Range   SARS Coronavirus 2 by RT PCR NEGATIVE NEGATIVE     Comment: (NOTE) SARS-CoV-2 target nucleic acids are NOT DETECTED.  The SARS-CoV-2 RNA is generally detectable in upper respiratory specimens during the acute phase of infection. The lowest concentration of SARS-CoV-2 viral copies this assay can detect is 138 copies/mL. A negative result does not preclude SARS-Cov-2 infection and should not be used as the sole basis for treatment or other patient management decisions. A negative result may occur with  improper specimen collection/handling, submission of specimen other than nasopharyngeal swab, presence of viral mutation(s) within the areas targeted by this assay, and inadequate number of viral copies(<138 copies/mL). A negative result must be combined with clinical observations, patient history, and epidemiological information. The expected result is Negative.  Fact Sheet for Patients:  BloggerCourse.com  Fact Sheet for Healthcare Providers:  SeriousBroker.it  This test is no t yet approved or cleared by the [place] FDA and  has been authorized for detection and/or diagnosis of SARS-CoV-2 by FDA under an Emergency Use Authorization (EUA). This EUA will remain  in effect (meaning this test can be used) for the duration of the COVID-19 declaration under Section 564(b)(1) of the Act, 21 U.S.C.section 360bbb-3(b)(1), unless the authorization is terminated  or revoked sooner.       Influenza A by PCR NEGATIVE NEGATIVE   Influenza B by PCR NEGATIVE NEGATIVE    Comment: (NOTE)  The Xpert Xpress SARS-CoV-2/FLU/RSV plus assay is intended as an aid in the diagnosis of influenza from Nasopharyngeal swab specimens and should not be used as a sole basis for treatment. Nasal washings and aspirates are unacceptable for Xpert Xpress SARS-CoV-2/FLU/RSV testing.  Fact Sheet for Patients: BloggerCourse.com  Fact Sheet for Healthcare  Providers: SeriousBroker.it  This test is not yet approved or cleared by the [place] FDA and has been authorized for detection and/or diagnosis of SARS-CoV-2 by FDA under an Emergency Use Authorization (EUA). This EUA will remain in effect (meaning this test can be used) for the duration of the COVID-19 declaration under Section 564(b)(1) of the Act, 21 U.S.C. section 360bbb-3(b)(1), unless the authorization is terminated or revoked.  Performed at [facility], [address], [place], [place] [address]   CBC with Differential     Status: Abnormal   Collection Time: 07/20/20  6:39 PM  Result Value Ref Range   WBC 23.7 (H) 4.0 - 10.5 K/uL   RBC 4.11 (L) 4.22 - 5.81 MIL/uL   Hemoglobin 11.7 (L) 13.0 - 17.0 g/dL   HCT 27.0 (L) 78.6 - 75.4 %   MCV 89.5 80.0 - 100.0 fL   MCH 28.5 26.0 - 34.0 pg   MCHC 31.8 30.0 - 36.0 g/dL   RDW 49.2 01.0 - 07.1 %   Platelets 373 150 - 400 K/uL   nRBC 0.0 0.0 - 0.2 %   Neutrophils Relative % 83 %   Neutro Abs 19.2 (H) 1.7 - 7.7 K/uL   Lymphocytes Relative 10 %   Lymphs Abs 2.5 0.7 - 4.0 K/uL   Monocytes Relative 6 %   Monocytes Absolute 1.5 (H) 0.1 - 1.0 K/uL   Eosinophils Relative 0 %   Eosinophils Absolute 0.1 0.0 - 0.5 K/uL   Basophils Relative 0 %   Basophils Absolute 0.1 0.0 - 0.1 K/uL   Immature Granulocytes 1 %   Abs Immature Granulocytes 0.34 (H) 0.00 - 0.07 K/uL    Comment: Performed at [facility], [address], [place], [place] [address]  Basic metabolic panel     Status: Abnormal   Collection Time: 07/20/20  6:39 PM  Result Value Ref Range   Sodium 133 (L) 135 - 145 mmol/L   Potassium 4.0 3.5 - 5.1 mmol/L   Chloride 96 (L) 98 - 111 mmol/L   CO2 27 22 - 32 mmol/L   Glucose, Bld 180 (H) 70 - 99 mg/dL    Comment: Glucose reference range applies only to samples taken after fasting for at least 8 hours.   BUN 24 (H) 6 - 20 mg/dL   Creatinine, Ser 8.83 (H) 0.61 - 1.24 mg/dL   Calcium  9.1 8.9 - 25.4 mg/dL   GFR, Estimated 50 (L) >60 mL/min    Comment: (NOTE) Calculated using the CKD-EPI Creatinine Equation (2021)    Anion gap 10 5 - 15    Comment: Performed at [facility], [address], [place], [place] [address]  Lactic acid, plasma     Status: Abnormal   Collection Time: 07/20/20  6:39 PM  Result Value Ref Range   Lactic Acid, Venous 2.1 (HH) 0.5 - 1.9 mmol/L    Comment: CRITICAL RESULT CALLED TO, READ BACK BY AND VERIFIED WITH: [facility] RN @ 2001 07/20/20 LEONARD,A Performed at [facility], [address] [place], [place] [address]   Blood culture (routine x 2)     Status: None (Preliminary result)  Collection Time: 07/20/20  8:42 PM   Specimen: BLOOD  Result Value Ref Range   Specimen Description BLOOD SITE NOT SPECIFIED    Special Requests      BOTTLES DRAWN AEROBIC AND ANAEROBIC Blood Culture adequate volume   Culture      NO GROWTH < 12 HOURS Performed at [facility], [address] [place], [place] [address]    Report Status PENDING   Blood culture (routine x 2)     Status: None (Preliminary result)   Collection Time: 07/20/20  9:13 PM   Specimen: BLOOD  Result Value Ref Range   Specimen Description BLOOD SITE NOT SPECIFIED    Special Requests      BOTTLES DRAWN AEROBIC AND ANAEROBIC Blood Culture adequate volume   Culture      NO GROWTH < 12 HOURS Performed at [facility], [address] [place], [place] [address]    Report Status PENDING   Lactic acid, plasma     Status: None   Collection Time: 07/20/20  9:13 PM  Result Value Ref Range   Lactic Acid, Venous 1.4 0.5 - 1.9 mmol/L    Comment: Performed at [facility], [address] [place], [place] [address]  Glucose, capillary     Status: Abnormal   Collection Time: 07/21/20 12:40 AM  Result Value Ref Range   Glucose-Capillary 177 (H) 70 - 99 mg/dL    Comment: Glucose reference range applies only to samples taken after fasting for at least 8 hours.   HIV Antibody (routine testing w rflx)     Status: None   Collection Time: 07/21/20  1:13 AM  Result Value Ref Range   HIV Screen 4th Generation wRfx Non Reactive Non Reactive    Comment: Performed at [facility], [address] [place], [place] [address]  Comprehensive metabolic panel     Status: Abnormal   Collection Time: 07/21/20  1:14 AM  Result Value Ref Range   Sodium 134 (L) 135 - 145 mmol/L   Potassium 4.1 3.5 - 5.1 mmol/L   Chloride 96 (L) 98 - 111 mmol/L   CO2 27 22 - 32 mmol/L   Glucose, Bld 172 (H) 70 - 99 mg/dL    Comment: Glucose reference range applies only to samples taken after fasting for at least 8 hours.   BUN 23 (H) 6 - 20 mg/dL   Creatinine, Ser 2.951.45 (H) 0.61 - 1.24 mg/dL   Calcium 8.8 (L) 8.9 - 10.3 mg/dL   Total Protein 7.6 6.5 - 8.1 g/dL   Albumin 2.4 (L) 3.5 - 5.0 g/dL   AST 20 15 - 41 U/L   ALT 16 0 - 44 U/L   Alkaline Phosphatase 70 38 - 126 U/L   Total Bilirubin 1.0 0.3 - 1.2 mg/dL   GFR, Estimated 58 (L) >60 mL/min    Comment: (NOTE) Calculated using the CKD-EPI Creatinine Equation (2021)    Anion gap 11 5 - 15    Comment: Performed at [facility], [address] [place], [place] [address]  CBC     Status: Abnormal   Collection Time: 07/21/20  1:14 AM  Result Value Ref Range   WBC 19.9 (H) 4.0 - 10.5 K/uL   RBC 3.88 (L) 4.22 - 5.81 MIL/uL   Hemoglobin 11.1 (L) 13.0 - 17.0 g/dL   HCT 86.535.3 (L) 78.439.0 - 69.652.0 %   MCV 91.0 80.0 - 100.0 fL   MCH  28.6 26.0 - 34.0 pg   MCHC 31.4 30.0 - 36.0 g/dL   RDW 97.3 53.2 - 99.2 %   Platelets 370 150 - 400 K/uL   nRBC 0.0 0.0 - 0.2 %    Comment: Performed at [facility], [address], [place], [place] [address]  Glucose, capillary     Status: Abnormal   Collection Time: 07/21/20  7:46 AM  Result Value Ref Range   Glucose-Capillary 165 (H) 70 - 99 mg/dL    Comment: Glucose reference range applies only to samples taken after fasting for at least 8 hours.  Glucose, capillary     Status:  Abnormal   Collection Time: 07/21/20 12:10 PM  Result Value Ref Range   Glucose-Capillary 126 (H) 70 - 99 mg/dL    Comment: Glucose reference range applies only to samples taken after fasting for at least 8 hours.    MR FOOT RIGHT W WO CONTRAST  Result Date: 07/21/2020 CLINICAL DATA:  Open wound involving the great toe. EXAM: MRI OF THE RIGHT FOREFOOT WITHOUT AND WITH CONTRAST TECHNIQUE: Multiplanar, multisequence MR imaging of the right foot was performed before and after the administration of intravenous contrast. CONTRAST:  44mL GADAVIST GADOBUTROL 1 MMOL/ML IV SOLN COMPARISON:  Radiographs 07/20/2020 FINDINGS: There is a open wound noted along the medial aspect of the great toe which appears to go right down close to the cortex of the bone near the level of the interphalangeal joint. Associated surrounding soft tissue swelling/edema/fluid consistent with cellulitis. No discrete rim enhancing abscess is identified. There is also a second smaller open wound on the dorsal aspect of the toe. Abnormal T1 and T2 signal intensity in the distal phalanx consistent with osteomyelitis. There is also mild enhancement. Could not exclude septic arthritis at the interphalangeal joint. Diffuse myofasciitis without findings for pyomyositis. IMPRESSION: 1. Open wounds along the medial and dorsal aspect of the great toe. Associated cellulitis and myofasciitis without findings for discrete drainable soft tissue abscess or pyomyositis. 2. MR findings consistent with osteomyelitis involving the distal phalanx of the great toe. Possible septic arthritis at the interphalangeal joint. Electronically Signed   By: Rudie Meyer M.D.   On: 07/21/2020 07:58   DG Foot Complete Right  Result Date: 07/20/2020 CLINICAL DATA:  Right foot ulceration, pain EXAM: RIGHT FOOT COMPLETE - 3+ VIEW COMPARISON:  06/29/2020 FINDINGS: Frontal, oblique, and lateral views of the right foot are obtained. Soft tissue ulceration medial aspect first  digit has progressed since prior study, with subcutaneous gas identified. Erosive changes are seen at the base of the first distal phalanx compatible with osteomyelitis. There are no acute displaced fractures. Diffuse vascular calcifications are noted. IMPRESSION: 1. Progressive soft tissue ulceration of the first digit, with osteomyelitis of the first distal phalanx. Electronically Signed   By: Sharlet Salina M.D.   On: 07/20/2020 21:25    ROS Blood pressure (!) 177/98, pulse 92, temperature 98.8 F (37.1 C), temperature source Oral, resp. rate 18, height 5\' 9"  (1.753 m), weight (!) 155.7 kg, SpO2 96 %.  Vitals:   07/21/20 0037 07/21/20 0439  BP: (!) 178/82 (!) 177/98  Pulse: 93 92  Resp: 18 18  Temp: 98.7 F (37.1 C) 98.8 F (37.1 C)  SpO2:  96%    General AA&O x3. Normal mood and affect.  Vascular Right foot warm to touch. Gangrenous changes to the hallux.  Neurologic Epicritic sensation grossly diminished.  Dermatologic (Wound)  Right great toe wet gangrene with erythema  of the medial foot extending almost to the ankle. Severe malodor. Purulent drainage noted. Exposed bone noted in wound bed.   Orthopedic: Motor intact BLE.    Vascular procedure 07/14/2020 1.  No significant aortoiliac disease. 2.  Right lower extremity: No significant SFA disease, borderline significant distal popliteal artery stenosis followed by an occluded TP trunk into the proximal portion of the peroneal artery.  Occluded anterior tibial artery at its origin with reconstitution in the mid calf.  Occluded posterior tibial artery at its origin with reconstitution in the distal calf. 3.  Successful orbital atherectomy and balloon angioplasty of the right TP trunk into the peroneal artery as well drug-coated balloon angioplasty of the distal popliteal artery.  Recommendations: Dual antiplatelet therapy for few months. Aggressive treatment of risk factors. Suspect good improvement in blood flow with today's  procedure given excellent collaterals to the anterior and posterior tibial artery distally.  However, if healing is not optimal, we might need to consider retrograde revascularization of the anterior tibial artery.  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 -Imaging: Studies independently reviewed -Antibiotics: continue empiric IV ABx -WB Status: WBAT RLE -Wound Care: Dressed with DSD today -Surgical Plan: Plan for urgent I&D with likely partial 1st ray resection. To be added on today. Time pending. Called OR to add on.  -Continue NPO  Park Liter 07/21/2020, 1:20 PM   Best available via secure chat for questions or concerns.

## 2020-07-21 NOTE — Op Note (Signed)
  Patient Name: Austin Shaw DOB: 1965-06-15  MRN: [id number]   Date of Surgery: 07/21/20  Surgeon: Dr. Hardie Pulley, DPM Assistants: none  Pre-operative Diagnosis:  Wet gangrene of toe right foot (hallux) Abscess right foot Cellulitis right foot Post-operative Diagnosis:  Same Procedures:  1) Incision and drainage complex bursal infection below the deep fascia, foot  2) Amputation right great toe  Pathology/Specimens: ID Type Source Tests Collected by Time Destination  1 : Right Great toe Tissue PATH Digit amputation SURGICAL PATHOLOGY Evelina Bucy, DPM 07/21/2020 2146   A : Right great toe Tissue Wound FUNGUS CULTURE WITH STAIN, AEROBIC/ANAEROBIC CULTURE W GRAM STAIN (SURGICAL/DEEP WOUND) Evelina Bucy, DPM 07/21/2020 2142    Anesthesia: MAC/local Hemostasis: * No tourniquets in log * Estimated Blood Loss: 20 ml Materials: * No implants in log * Medications: 1g vancomycin topical, 10 cc marcaine 0.5 % plain Complications: none  Indications for Procedure:  This is a 55 y.o. male with a severe right foot infection with gangrene of the right great toe.  Discussed would benefit from urgent incision and drainage for resolution of the infection and to combat sepsis.  All risk benefits alternatives of surgery were discussed with the patient no guarantees were given.   Procedure in Detail: Patient was identified in pre-operative holding area. Formal consent was signed and the right lower extremity was marked. Patient was brought back to the operating room. Anesthesia was induced. The extremity was prepped and draped in the usual sterile fashion. Timeout was taken to confirm patient name, laterality, and procedure prior to incision.   Attention was then directed to the right foot.  There was immediate purulence at the dorsal after the digit which was collected for culture.  There were gangrenous changes to the plantar aspect to the digit.  A racquet shaped incision was made  about the medial aspect of the great toe and first metatarsophalangeal joint. The toe was sharply disarticulated at the metatarsal phalangeal joint.  Of note minimal bleeding was noted at this time.  Additionally, purulence was noted at this area.  Dissection was continued along the dorsal aspect of the first metatarsal.  There was purulence noted over the first metatarsal along the extensor tendon sheath.  The incision was then lengthened and the tissue overlying the the first metatarsal was sharply dissected showing purulence and necrosis.  This was sharply excised with 15 blade.  The wound was then copiously irrigated with approximate 2 L of normal saline via pulse lavage.  The wound was then again debrided with a rongeur and 15 blade down to bleeding viable tissue. Hemostasis was then achieved with cautery.  The wound was loosely approximated with 2-0 and 3-0 nylon. The wound was packed with Surgicel, iodoform packing, topical vancomycin.  The foot was dressed with 4 x 4 Kerlix and Ace bandage. Patient tolerated the procedure well.  Disposition: Following a period of post-operative monitoring, patient will be transferred back to the floor.  It does appear at the resection margin that he has bleeding viable tissue.  I am hopeful that this is adequate for healing however this may cause delayed healing of the wound.  Given the extent of necrosis the wound was not fully closed today.  He would benefit from planned return to the OR later this week for repeat debridement and definitive closure.

## 2020-07-21 NOTE — Consult Note (Addendum)
Hospital Consult    Reason for Consult: non healing right 1st toe wound Requesting Physician:  Dr. Kathlyn Sacramento MRN #:  [id number]  History of Present Illness: This is a 55 y.o. male who vascular surgery has been asked to see in consultation by Dr. Fletcher Anon for non healing right foot wound. Patients past medical history is significant for Diabetes, HTN, Hyperlipidemia, and CHF. Patient explains that his right toe wound has been present for approximately 3.5 weeks. He has been seeing his Podiatrist, Dr. Posey Pronto, for management of the wound. Wound seemed to be worsening so he was referred to Dr. Fletcher Anon for evaluation of arterial flow in the right lower extremity. On 07/14/20 Dr. Fletcher Anon performed Abdominal Aortic Angiogram with Bi-Iliofemoral Runoff, Right TP trunk and peroneal artery orbital atherectomy and balloon angioplasty And Right popliteal artery drug-coated balloon angioplasty. He was placed on Dual antiplatelet therapy post angiography. He was seen at Podiatrists yesterday with concerns of wounds not improving so he was sent to the ED. Patient states he does have soreness in the right foot. Motor and sensation is intact. He has noticed a foul odor as well from the wound. He denies any fever or chills. He has leukocytosis as well as elevated lactic acid. He has been admitted for IV antibiotics and further wound Management. He has been started on IV Vancomycin  Past Medical History:  Diagnosis Date  . Allergy   . Diabetes mellitus    type 2  . Hyperlipidemia   . Hypertension   . Morbid obesity (HCC)    BMI 50  . Sleep apnea     Past Surgical History:  Procedure Laterality Date  . ABDOMINAL AORTOGRAM W/LOWER EXTREMITY N/A 07/14/2020   Procedure: ABDOMINAL AORTOGRAM W/LOWER EXTREMITY;  Surgeon: Wellington Hampshire, MD;  Location: [facility];  Service: Cardiovascular;  Laterality: N/A;  . TONSILLECTOMY      Allergies  Allergen Reactions  . Lipitor [Atorvastatin]     intolerant     Prior to Admission medications   Medication Sig Start Date End Date Taking? Authorizing Provider  aspirin EC 81 MG tablet Take 1 tablet (81 mg total) by mouth daily. Swallow whole. 07/08/20  Yes Wellington Hampshire, MD  atorvastatin (LIPITOR) 40 MG tablet Take 0.5 tablets (20 mg total) by mouth daily. 07/16/20 10/14/20 Yes Tonia Ghent, MD  Blood Glucose Monitoring Suppl (ONE TOUCH ULTRA SYSTEM KIT) w/Device KIT Check blood sugar twice a day and as directed. Dx E11.8 08/01/16  Yes Tonia Ghent, MD  carvedilol (COREG) 3.125 MG tablet TAKE 1 TABLET (3.125 MG TOTAL) BY MOUTH 2 (TWO) TIMES DAILY WITH A MEAL. Patient taking differently: Take 3.125 mg by mouth 2 (two) times daily with a meal. 07/09/20  Yes Tonia Ghent, MD  clopidogrel (PLAVIX) 75 MG tablet Take 1 tablet (75 mg total) by mouth daily. 07/14/20 07/14/21 Yes Wellington Hampshire, MD  doxycycline (VIBRA-TABS) 100 MG tablet Take 1 tablet (100 mg total) by mouth 2 (two) times daily. 06/26/20  Yes Felipa Furnace, DPM  Dulaglutide (TRULICITY) 2.83 TD/1.7OH SOPN Inject 0.75 mg into the skin once a week. Every Friday in the evening 07/16/20  Yes Tonia Ghent, MD  furosemide (LASIX) 40 MG tablet Take 1 tablet (40 mg total) by mouth daily as needed for fluid. 07/16/20  Yes Tonia Ghent, MD  glucose blood test strip CHECK BLOOD SUGAR TWICE DAILY 06/20/18  Yes Tonia Ghent, MD  losartan (COZAAR) 25 MG tablet TAKE  1 TABLET BY MOUTH EVERY DAY 02/16/20  Yes Tonia Ghent, MD  metFORMIN (GLUCOPHAGE) 500 MG tablet Take 500-1,000 mg by mouth See admin instructions. Take 1000 mg in the morning and 500 mg in the evening   Yes [provider]    Social History   Socioeconomic History  . Marital status: Married    Spouse name: Not on file  . Number of children: Not on file  . Years of education: Not on file  . Highest education level: Not on file  Occupational History  . Occupation: Games developer: [employer]  Tobacco Use  .  Smoking status: Never Smoker  . Smokeless tobacco: Never Used  Vaping Use  . Vaping Use: Never used  Substance and Sexual Activity  . Alcohol use: No    Alcohol/week: 0.0 standard drinks  . Drug use: No  . Sexual activity: Not on file  Other Topics Concern  . Not on file  Social History Narrative   Working at [employer].    4 kids   Married, Marcus [place]   Social Determinants of Health   Financial Resource Strain: Not on [employer] Insecurity: Not on file  Transportation Needs: Not on file  Physical Activity: Not on file  Stress: Not on file  Social Connections: Not on file  Intimate Partner Violence: Not on file     Family History  Problem Relation Age of Onset  . Diabetes Father   . Diabetes Other        fam hx 1st degree relative  . Congestive Heart Failure Other   . Lupus Daughter   . Sjogren's syndrome Daughter   . Colon cancer Neg Hx   . Prostate cancer Neg Hx   . Esophageal cancer Neg Hx   . Rectal cancer Neg Hx     ROS: Otherwise negative unless mentioned in HPI  Physical Examination  Vitals:   07/21/20 0037 07/21/20 0439  BP: (!) 178/82 (!) 177/98  Pulse: 93 92  Resp: 18 18  Temp: 98.7 F (37.1 C) 98.8 F (37.1 C)  SpO2:  96%   Body mass index is 50.69 kg/m.  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 Right foot warm Musculoskeletal: no muscle wasting or atrophy  Neurologic: A&O X 3;  No focal weakness or paresthesias are detected; speech is fluent/normal Psychiatric:  The pt has Normal affect. Lymph:  Unremarkable  CBC    Component  Value Date/Time   WBC 19.9 (H) 07/21/2020 0114   RBC 3.88 (L) 07/21/2020 0114   HGB 11.1 (L) 07/21/2020 0114   HGB 12.1 (L) 07/08/2020 1034   HCT 35.3 (L) 07/21/2020 0114   HCT 36.1 (L) 07/08/2020 1034   PLT 370 07/21/2020 0114   PLT 240 07/08/2020 1034   MCV 91.0 07/21/2020 0114   MCV 85 07/08/2020 1034   MCH 28.6 07/21/2020 0114   MCHC 31.4 07/21/2020 0114   RDW 13.2 07/21/2020 0114   RDW 12.4 07/08/2020 1034   LYMPHSABS 2.5 07/20/2020 1839   LYMPHSABS 2.0 07/08/2020 1034   MONOABS 1.5 (H) 07/20/2020  1839   EOSABS 0.1 07/20/2020 1839   EOSABS 0.1 07/08/2020 1034   BASOSABS 0.1 07/20/2020 1839   BASOSABS 0.1 07/08/2020 1034    BMET    Component Value Date/Time   NA 134 (L) 07/21/2020 0114   NA 138 07/08/2020 1034   K 4.1 07/21/2020 0114   CL 96 (L) 07/21/2020 0114   CO2 27 07/21/2020 0114   GLUCOSE 172 (H) 07/21/2020 0114   BUN 23 (H) 07/21/2020 0114   BUN 19 07/08/2020 1034   CREATININE 1.45 (H) 07/21/2020 0114   CREATININE 1.10 10/29/2015 0834   CALCIUM 8.8 (L) 07/21/2020 0114   GFRNONAA 58 (L) 07/21/2020 0114   GFRAA >60 05/15/2018 0310    COAGS: No results found for: INR, PROTIME   Non-Invasive Vascular Imaging:   Pending RLE arterial duplex  Statin:  Yes.   Beta Blocker:  Yes.   Aspirin:  Yes.   ACEI:  No. ARB:  Yes.   CCB use:  No Other antiplatelets/anticoagulants:  Yes.   Plavix   ASSESSMENT/PLAN: This is a 55 y.o. male with non healing right toe wound. He just underwent revascularization by Dr. Fletcher Anon on 07/14/20. Clinically the runoff is patent with brisk doppler signals. Pending right lower extremity arterial duplex. Do not think he warrants any further intervention from vascular standpoint as he has inline flow into foot. Podiatry to see patient today for further management of 1st toe. At minimum he needs 1st toe amputation. Continue IV abx. Medical management per primary team. Patient was seen with Dr. Carlis Abbott who will provide further  recommendations  Karoline Caldwell PA-C [facility] [phone] 07/21/2020  9:05 AM   I have seen and evaluated the patient. I agree with the PA note as documented above.  55 year old male has had tissue loss of the right lower extremity for about 3 weeks.  Last Wednesday he underwent right lower extremity intervention with Dr. Fletcher Anon.  Ultimately he had successful orbital atherectomy balloon angioplasty of the right TP trunk and peroneal artery and established inline flow down the right leg.  He was admitted last night from the ED with worsening tissue loss and infection in the right foot after podiatry office visit yesterday.  Dr. Fletcher Anon asked [place] to see him today.  We reviewed his previous arteriogram pictures and again it does appear that he got inline flow down the right lower extremity.  Patient has a triphasic peroneal signal at the ankle.  He also has very brisk dorsalis pedis and posterior tibial signals.  The foot clearly has wet gangrene with purulent drainage from the great toe and I think he needs probably a ray amputation at the very least depending on the extent of infection in the foot.  I do not think I would do anything additional from a revascularization standpoint unless his toe amp does not heal.  At that point you could consider retrograde tibial.  We will get a right lower extremity arterial duplex to confirm patency of the TP trunk peroneal intervention.  In talking to Dr. Fletcher Anon podiatry plans to see him in the hospital and if they need vascular to assist with amputation please let us know otherwise will defer to podiatry.  Marty Heck, MD [facility] Office: [phone]

## 2020-07-21 NOTE — Progress Notes (Signed)
PROGRESS NOTE    [place] Shaw  EAV:[id number] DOB: 08-Dec-1965 DOA: 07/20/2020 PCP: Joaquim Nam, MD    Brief Narrative:  55 year old male with history of diabetes, peripheral vascular disease, morbid obesity, hypertension, admitted to the hospital with right great toe osteomyelitis/gangrene.  He has been started on intravenous antibiotics.  Podiatry following and plans on likely amputation.  He recently had revascularization done of his right lower extremity.  Seen by vascular surgery who did not feel that any further revascularization was indicated at this time.   Assessment & Plan:   Principal Problem:   Gangrene of toe of right foot (HCC) Active Problems:   Diabetes mellitus type 2, uncontrolled, with complications (HCC)   OBESITY, MORBID   Essential hypertension   Cardiomyopathy (HCC)   Chronic systolic heart failure (HCC)   PVD (peripheral vascular disease) (HCC)   Osteomyelitis and gangrene right great toe -Currently on intravenous antibiotics -Podiatry following and plans on incision and drainage with likely partial first ray resection -Seen by vascular surgery and it was not felt that he needed further revascularization at this point -Blood cultures in process -He has significant leukocytosis  Acute kidney injury -Baseline creatinine of 1.1 -Admitted with creatinine of 1.6 -He has been started on IV fluids with slow improvement of renal function -Continue current treatments   Diabetes, type II, uncontrolled, with hyperglycemia -Holding home doses of Metformin and Trulicity -Currently on sliding scale insulin -Blood sugars currently stable  Peripheral vascular disease -Recently had Abdominal Aortic Angiogram with Bi-Iliofemoral Runoff, Right TP trunk and peroneal artery orbital atherectomy and balloon angioplasty And Right popliteal artery drug-coated balloon angioplasty on 4/6 by Dr. Kirke Corin -He has been on aspirin and Plavix -Seen by vascular surgery  today and it was not felt that further revascularization was indicated at this point  Hypertension -Continue on Coreg, losartan -Blood pressure elevated -Use hydralazine as needed  Obesity, class III -BMI 50.6 -Counseled on importance of diet and exercise     DVT prophylaxis: Lovenox  Code Status: Full code Family Communication: Discussed with wife at the bedside Disposition Plan: Status is: Inpatient  Remains inpatient appropriate because:Inpatient level of care appropriate due to severity of illness   Dispo: The patient is from: Home              Anticipated d/c is to: Home              Patient currently is not medically stable to d/c.   Difficult to place patient No         Consultants:   Podiatry  Vascular surgery  Procedures:     Antimicrobials:   Unasyn 4/13>  Vancomycin 4/12>   Subjective: He continues to have pain in his foot.  Denies any shortness of breath.  No chest pain or vomiting.  Objective: Vitals:   07/21/20 0037 07/21/20 0439 07/21/20 1410 07/21/20 1656  BP: (!) 178/82 (!) 177/98 (!) 180/89 (!) 192/95  Pulse: 93 92 82 93  Resp: 18 18  18   Temp: 98.7 F (37.1 C) 98.8 F (37.1 C) 98.5 F (36.9 C) 99.4 F (37.4 C)  TempSrc: Oral Oral Oral Oral  SpO2:  96% 96% 96%  Weight:  (!) 155.7 kg    Height:        Intake/Output Summary (Last 24 hours) at 07/21/2020 1852 Last data filed at 07/21/2020 1214 Gross per 24 hour  Intake 1079.62 ml  Output 1600 ml  Net -520.38 ml   [employer]  07/20/20 1828 07/21/20 0439  Weight: (!) 154.2 kg (!) 155.7 kg    Examination:  General exam: Appears calm and comfortable  Respiratory system: Clear to auscultation. Respiratory effort normal. Cardiovascular system: S1 & S2 heard, RRR. No JVD, murmurs, rubs, gallops or clicks. No pedal edema. Gastrointestinal system: Abdomen is nondistended, soft and nontender. No organomegaly or masses felt. Normal bowel sounds heard. Central nervous  system: Alert and oriented. No focal neurological deficits. Extremities: Symmetric 5 x 5 power. Skin: No rashes, lesions or ulcers Psychiatry: Judgement and insight appear normal. Mood & affect appropriate.     Data Reviewed: I have personally reviewed following labs and imaging studies  CBC: Recent Labs  Lab 07/20/20 1839 07/21/20 0114  WBC 23.7* 19.9*  NEUTROABS 19.2*  --   HGB 11.7* 11.1*  HCT 36.8* 35.3*  MCV 89.5 91.0  PLT 373 370   Basic Metabolic Panel: Recent Labs  Lab 07/20/20 1839 07/21/20 0114  NA 133* 134*  K 4.0 4.1  CL 96* 96*  CO2 27 27  GLUCOSE 180* 172*  BUN 24* 23*  CREATININE 1.63* 1.45*  CALCIUM 9.1 8.8*   GFR: Estimated Creatinine Clearance: 87.3 mL/min (A) (by C-G formula based on SCr of 1.45 mg/dL (H)). Liver Function Tests: Recent Labs  Lab 07/21/20 0114  AST 20  ALT 16  ALKPHOS 70  BILITOT 1.0  PROT 7.6  ALBUMIN 2.4*   No results for input(s): LIPASE, AMYLASE in the last 168 hours. No results for input(s): AMMONIA in the last 168 hours. Coagulation Profile: No results for input(s): INR, PROTIME in the last 168 hours. Cardiac Enzymes: No results for input(s): CKTOTAL, CKMB, CKMBINDEX, TROPONINI in the last 168 hours. BNP (last 3 results) No results for input(s): PROBNP in the last 8760 hours. HbA1C: No results for input(s): HGBA1C in the last 72 hours. CBG: Recent Labs  Lab 07/21/20 0040 07/21/20 0746 07/21/20 1210 07/21/20 1741  GLUCAP 177* 165* 126* 117*   Lipid Profile: No results for input(s): CHOL, HDL, LDLCALC, TRIG, CHOLHDL, LDLDIRECT in the last 72 hours. Thyroid Function Tests: No results for input(s): TSH, T4TOTAL, FREET4, T3FREE, THYROIDAB in the last 72 hours. Anemia Panel: No results for input(s): VITAMINB12, FOLATE, FERRITIN, TIBC, IRON, RETICCTPCT in the last 72 hours. Sepsis Labs: Recent Labs  Lab 07/20/20 1839 07/20/20 2113  LATICACIDVEN 2.1* 1.4    Recent Results (from the past 240 hour(s))   SARS CORONAVIRUS 2 (TAT 6-24 HRS) Nasopharyngeal Nasopharyngeal Swab     Status: None   Collection Time: 07/12/20  8:18 AM   Specimen: Nasopharyngeal Swab  Result Value Ref Range Status   SARS Coronavirus 2 NEGATIVE NEGATIVE Final    Comment: (NOTE) SARS-CoV-2 target nucleic acids are NOT DETECTED.  The SARS-CoV-2 RNA is generally detectable in upper and lower respiratory specimens during the acute phase of infection. Negative results do not preclude SARS-CoV-2 infection, do not rule out co-infections with other pathogens, and should not be used as the sole basis for treatment or other patient management decisions. Negative results must be combined with clinical observations, patient history, and epidemiological information. The expected result is Negative.  Fact Sheet for Patients: HairSlick.no  Fact Sheet for Healthcare Providers: quierodirigir.com  This test is not yet approved or cleared by the [place] FDA and  has been authorized for detection and/or diagnosis of SARS-CoV-2 by FDA under an Emergency Use Authorization (EUA). This EUA will remain  in effect (meaning this test can be used) for the duration of  the COVID-19 declaration under Se ction 564(b)(1) of the Act, 21 U.S.C. section 360bbb-3(b)(1), unless the authorization is terminated or revoked sooner.  Performed at [facility], [address], [place], [place] [address]   SARS Coronavirus 2 by RT PCR (hospital order, performed in [facility] hospital lab) Nasopharyngeal Nasopharyngeal Swab     Status: None   Collection Time: 07/14/20  7:51 AM   Specimen: Nasopharyngeal Swab  Result Value Ref Range Status   SARS Coronavirus 2 NEGATIVE NEGATIVE Final    Comment: (NOTE) SARS-CoV-2 target nucleic acids are NOT DETECTED.  The SARS-CoV-2 RNA is generally detectable in upper and lower respiratory specimens during the acute phase of infection. The  lowest concentration of SARS-CoV-2 viral copies this assay can detect is 250 copies / mL. A negative result does not preclude SARS-CoV-2 infection and should not be used as the sole basis for treatment or other patient management decisions.  A negative result may occur with improper specimen collection / handling, submission of specimen other than nasopharyngeal swab, presence of viral mutation(s) within the areas targeted by this assay, and inadequate number of viral copies (<250 copies / mL). A negative result must be combined with clinical observations, patient history, and epidemiological information.  Fact Sheet for Patients:   BoilerBrush.com.cy  Fact Sheet for Healthcare Providers: https://pope.com/  This test is not yet approved or  cleared by the [place] FDA and has been authorized for detection and/or diagnosis of SARS-CoV-2 by FDA under an Emergency Use Authorization (EUA).  This EUA will remain in effect (meaning this test can be used) for the duration of the COVID-19 declaration under Section 564(b)(1) of the Act, 21 U.S.C. section 360bbb-3(b)(1), unless the authorization is terminated or revoked sooner.  Performed at [facility], [address] [place], [place] [address]   Resp Panel by RT-PCR (Flu A&B, Covid) Nasopharyngeal Swab     Status: None   Collection Time: 07/20/20  6:30 PM   Specimen: Nasopharyngeal Swab; Nasopharyngeal(NP) swabs in vial transport medium  Result Value Ref Range Status   SARS Coronavirus 2 by RT PCR NEGATIVE NEGATIVE Final    Comment: (NOTE) SARS-CoV-2 target nucleic acids are NOT DETECTED.  The SARS-CoV-2 RNA is generally detectable in upper respiratory specimens during the acute phase of infection. The lowest concentration of SARS-CoV-2 viral copies this assay can detect is 138 copies/mL. A negative result does not preclude SARS-Cov-2 infection and should not be used as the  sole basis for treatment or other patient management decisions. A negative result may occur with  improper specimen collection/handling, submission of specimen other than nasopharyngeal swab, presence of viral mutation(s) within the areas targeted by this assay, and inadequate number of viral copies(<138 copies/mL). A negative result must be combined with clinical observations, patient history, and epidemiological information. The expected result is Negative.  Fact Sheet for Patients:  BloggerCourse.com  Fact Sheet for Healthcare Providers:  SeriousBroker.it  This test is no t yet approved or cleared by the [place] FDA and  has been authorized for detection and/or diagnosis of SARS-CoV-2 by FDA under an Emergency Use Authorization (EUA). This EUA will remain  in effect (meaning this test can be used) for the duration of the COVID-19 declaration under Section 564(b)(1) of the Act, 21 U.S.C.section 360bbb-3(b)(1), unless the authorization is terminated  or revoked sooner.       Influenza A by PCR NEGATIVE NEGATIVE Final   Influenza B by PCR NEGATIVE NEGATIVE Final  Comment: (NOTE) The Xpert Xpress SARS-CoV-2/FLU/RSV plus assay is intended as an aid in the diagnosis of influenza from Nasopharyngeal swab specimens and should not be used as a sole basis for treatment. Nasal washings and aspirates are unacceptable for Xpert Xpress SARS-CoV-2/FLU/RSV testing.  Fact Sheet for Patients: BloggerCourse.com  Fact Sheet for Healthcare Providers: SeriousBroker.it  This test is not yet approved or cleared by the [place] FDA and has been authorized for detection and/or diagnosis of SARS-CoV-2 by FDA under an Emergency Use Authorization (EUA). This EUA will remain in effect (meaning this test can be used) for the duration of the COVID-19 declaration under Section 564(b)(1) of the  Act, 21 U.S.C. section 360bbb-3(b)(1), unless the authorization is terminated or revoked.  Performed at [facility], [address] [place], [place] [address]   Blood culture (routine x 2)     Status: None (Preliminary result)   Collection Time: 07/20/20  8:42 PM   Specimen: BLOOD  Result Value Ref Range Status   Specimen Description BLOOD SITE NOT SPECIFIED  Final   Special Requests   Final    BOTTLES DRAWN AEROBIC AND ANAEROBIC Blood Culture adequate volume   Culture   Final    NO GROWTH < 12 HOURS Performed at [facility], [address], [place], [place] [address]    Report Status PENDING  Incomplete  Blood culture (routine x 2)     Status: None (Preliminary result)   Collection Time: 07/20/20  9:13 PM   Specimen: BLOOD  Result Value Ref Range Status   Specimen Description BLOOD SITE NOT SPECIFIED  Final   Special Requests   Final    BOTTLES DRAWN AEROBIC AND ANAEROBIC Blood Culture adequate volume   Culture   Final    NO GROWTH < 12 HOURS Performed at [facility], [address] [place], [place] [address]    Report Status PENDING  Incomplete         Radiology Studies: MR FOOT RIGHT W WO CONTRAST  Result Date: 07/21/2020 CLINICAL DATA:  Open wound involving the great toe. EXAM: MRI OF THE RIGHT FOREFOOT WITHOUT AND WITH CONTRAST TECHNIQUE: Multiplanar, multisequence MR imaging of the right foot was performed before and after the administration of intravenous contrast. CONTRAST:  107mL GADAVIST GADOBUTROL 1 MMOL/ML IV SOLN COMPARISON:  Radiographs 07/20/2020 FINDINGS: There is a open wound noted along the medial aspect of the great toe which appears to go right down close to the cortex of the bone near the level of the interphalangeal joint. Associated surrounding soft tissue swelling/edema/fluid consistent with cellulitis. No discrete rim enhancing abscess is identified. There is also a second smaller open wound on the dorsal aspect of the toe.  Abnormal T1 and T2 signal intensity in the distal phalanx consistent with osteomyelitis. There is also mild enhancement. Could not exclude septic arthritis at the interphalangeal joint. Diffuse myofasciitis without findings for pyomyositis. IMPRESSION: 1. Open wounds along the medial and dorsal aspect of the great toe. Associated cellulitis and myofasciitis without findings for discrete drainable soft tissue abscess or pyomyositis. 2. MR findings consistent with osteomyelitis involving the distal phalanx of the great toe. Possible septic arthritis at the interphalangeal joint. Electronically Signed   By: Rudie Meyer M.D.   On: 07/21/2020 07:58   DG Foot Complete Right  Result Date: 07/20/2020 CLINICAL DATA:  Right foot ulceration, pain EXAM: RIGHT FOOT COMPLETE - 3+ VIEW COMPARISON:  06/29/2020 FINDINGS: Frontal, oblique, and lateral views of  the right foot are obtained. Soft tissue ulceration medial aspect first digit has progressed since prior study, with subcutaneous gas identified. Erosive changes are seen at the base of the first distal phalanx compatible with osteomyelitis. There are no acute displaced fractures. Diffuse vascular calcifications are noted. IMPRESSION: 1. Progressive soft tissue ulceration of the first digit, with osteomyelitis of the first distal phalanx. Electronically Signed   By: Sharlet Salina M.D.   On: 07/20/2020 21:25   VAS [place] LOWER EXTREMITY ARTERIAL DUPLEX  Result Date: 07/21/2020 LOWER EXTREMITY ARTERIAL DUPLEX STUDY Indications: Ulceration, peripheral artery disease, and Evaluate patency of TP              trunk/peroneal intervention.  Vascular Interventions: 07-14-2020 Right TP trunk and peroneal atherectomy and                         balloon angioplasty. Right poplital balloon angioplasty. Current ABI:            07-08-2020 ABI was RT 0.62 LT 0.87 Limitations: Comparison Study: 07-08-2020 Prior lower extremity arterial bilateral study                   showed monophasic  waveforms in the right TP trunk, ATA, and                   peroneal. Performing Technologist: Jean Rosenthal RDMS,RVT  Examination Guidelines: A complete evaluation includes B-mode imaging, spectral Doppler, color Doppler, and power Doppler as needed of all accessible portions of each vessel. Bilateral testing is considered an integral part of a complete examination. Limited examinations for reoccurring indications may be performed as noted.  +-----------+--------+-----+---------------+----------+--------+ RIGHT      PSV cm/sRatioStenosis       Waveform  Comments +-----------+--------+-----+---------------+----------+--------+ CFA Prox   144                         triphasic          +-----------+--------+-----+---------------+----------+--------+ CFA Mid    130                         triphasic          +-----------+--------+-----+---------------+----------+--------+ CFA Distal 119                         triphasic          +-----------+--------+-----+---------------+----------+--------+ DFA        77                          biphasic           +-----------+--------+-----+---------------+----------+--------+ SFA Prox   152                         triphasic          +-----------+--------+-----+---------------+----------+--------+ SFA Mid    144                         triphasic          +-----------+--------+-----+---------------+----------+--------+ SFA Distal 114                         triphasic          +-----------+--------+-----+---------------+----------+--------+ POP Prox   166  triphasic          +-----------+--------+-----+---------------+----------+--------+ POP Mid    61                          monophasic         +-----------+--------+-----+---------------+----------+--------+ POP Distal 84                          monophasic         +-----------+--------+-----+---------------+----------+--------+ TP  Trunk   354          50-74% stenosis                   +-----------+--------+-----+---------------+----------+--------+ ATA Prox   40                          monophasic         +-----------+--------+-----+---------------+----------+--------+ ATA Mid    45                          monophasic         +-----------+--------+-----+---------------+----------+--------+ ATA Distal 63                          monophasic         +-----------+--------+-----+---------------+----------+--------+ PTA Prox   147                                            +-----------+--------+-----+---------------+----------+--------+ PTA Distal 140                         monophasic         +-----------+--------+-----+---------------+----------+--------+ PERO Prox  123                         monophasic         +-----------+--------+-----+---------------+----------+--------+ PERO Mid   144                         monophasic         +-----------+--------+-----+---------------+----------+--------+ PERO Distal116                         monophasic         +-----------+--------+-----+---------------+----------+--------+ DP         78                          monophasic         +-----------+--------+-----+---------------+----------+--------+   Summary: Right: 30-49% stenosis noted in the popliteal artery. 50-74% stenosis noted in the TP trunk. Monophasic waveforms in the DPA, PTA, and Pero A.  See table(s) above for measurements and observations. Electronically signed by Sherald Hesshristopher Clark MD on 07/21/2020 at 5:42:49 PM.    Final         Scheduled Meds: . atorvastatin  20 mg Oral Daily  . carvedilol  3.125 mg Oral BID WC  . clopidogrel  75 mg Oral Daily  . [START ON 07/22/2020] enoxaparin (LOVENOX) injection  70 mg Subcutaneous Q24H  . insulin aspart  0-15 Units Subcutaneous TID WC  . insulin aspart  0-5 Units Subcutaneous QHS  . losartan  25 mg Oral Daily   Continuous  Infusions: . ampicillin-sulbactam (UNASYN) IV 3 g (07/21/20 1413)  . lactated ringers 100 mL/hr at 07/21/20 1140  . vancomycin HCl       LOS: 1 day    Time spent:    Erick Blinks, MD [facility]   If 7PM-7AM, please contact night-coverage [email or web address]  07/21/2020, 6:52 PM

## 2020-07-22 ENCOUNTER — Encounter (HOSPITAL_COMMUNITY): Payer: Self-pay | Admitting: Podiatry

## 2020-07-22 LAB — COMPREHENSIVE METABOLIC PANEL
ALT: 17 U/L (ref 0–44)
AST: 19 U/L (ref 15–41)
Albumin: 2.1 g/dL — ABNORMAL LOW (ref 3.5–5.0)
Alkaline Phosphatase: 62 U/L (ref 38–126)
Anion gap: 8 (ref 5–15)
BUN: 12 mg/dL (ref 6–20)
CO2: 27 mmol/L (ref 22–32)
Calcium: 8.3 mg/dL — ABNORMAL LOW (ref 8.9–10.3)
Chloride: 97 mmol/L — ABNORMAL LOW (ref 98–111)
Creatinine, Ser: 1.13 mg/dL (ref 0.61–1.24)
GFR, Estimated: >60 mL/min (ref >60)
Glucose, Bld: 184 mg/dL — ABNORMAL HIGH (ref 70–99)
Potassium: 4.4 mmol/L (ref 3.5–5.1)
Sodium: 132 mmol/L — ABNORMAL LOW (ref 135–145)
Total Bilirubin: 0.7 mg/dL (ref 0.3–1.2)
Total Protein: 6.9 g/dL (ref 6.5–8.1)

## 2020-07-22 LAB — CBC
HCT: 35.5 % — ABNORMAL LOW (ref 39.0–52.0)
Hemoglobin: 11.1 g/dL — ABNORMAL LOW (ref 13.0–17.0)
MCH: 28 pg (ref 26.0–34.0)
MCHC: 31.3 g/dL (ref 30.0–36.0)
MCV: 89.6 fL (ref 80.0–100.0)
Platelets: 350 10*3/uL (ref 150–400)
RBC: 3.96 MIL/uL — ABNORMAL LOW (ref 4.22–5.81)
RDW: 13.2 % (ref 11.5–15.5)
WBC: 15.9 10*3/uL — ABNORMAL HIGH (ref 4.0–10.5)
nRBC: 0 % (ref 0.0–0.2)

## 2020-07-22 LAB — GLUCOSE, CAPILLARY
Glucose-Capillary: 129 mg/dL — ABNORMAL HIGH (ref 70–99)
Glucose-Capillary: 166 mg/dL — ABNORMAL HIGH (ref 70–99)
Glucose-Capillary: 154 mg/dL — ABNORMAL HIGH (ref 70–99)
Glucose-Capillary: 173 mg/dL — ABNORMAL HIGH (ref 70–99)

## 2020-07-22 MED ORDER — VANCOMYCIN HCL 1750 MG/350ML IV SOLN
1250.0000 mg | Freq: Two times a day (BID) | INTRAVENOUS | Status: DC
  Filled 2020-07-22: qty 350

## 2020-07-22 MED ORDER — HYDROXYZINE HCL 25 MG PO TABS
25.0000 mg | ORAL_TABLET | Freq: Three times a day (TID) | ORAL | Status: DC | PRN
  Administered 2020-07-22 – 2020-07-26 (×4): 25 mg via ORAL
  Filled 2020-07-22 (×4): qty 1

## 2020-07-22 MED ORDER — OXYCODONE-ACETAMINOPHEN 5-325 MG PO TABS
1.0000 | ORAL_TABLET | ORAL | Status: DC | PRN
  Administered 2020-07-22: 1 via ORAL
  Administered 2020-07-22 – 2020-07-27 (×23): 2 via ORAL
  Filled 2020-07-22 (×22): qty 2
  Filled 2020-07-22: qty 1
  Filled 2020-07-22 (×2): qty 2

## 2020-07-22 MED ORDER — VANCOMYCIN HCL 1250 MG/250ML IV SOLN
1250.0000 mg | Freq: Two times a day (BID) | INTRAVENOUS | Status: DC
  Administered 2020-07-22 – 2020-07-23 (×3): 1250 mg via INTRAVENOUS
  Filled 2020-07-22 (×4): qty 250

## 2020-07-22 NOTE — Progress Notes (Signed)
PROGRESS NOTE    Austin Shaw  WUJ:[id number] DOB: September 23, 1965 DOA: 07/20/2020 PCP: Joaquim Nam, MD    Brief Narrative:  55 year old male with history of diabetes, peripheral vascular disease, morbid obesity, hypertension, admitted to the hospital with right great toe osteomyelitis/gangrene.  He has been started on intravenous antibiotics.  Podiatry following and plans on likely amputation.  He recently had revascularization done of his right lower extremity.  Seen by vascular surgery who did not feel that any further revascularization was indicated at this time.   Assessment & Plan:   Principal Problem:   Gangrene of toe of right foot (HCC) Active Problems:   Diabetes mellitus type 2, uncontrolled, with complications (HCC)   OBESITY, MORBID   Essential hypertension   Cardiomyopathy (HCC)   Chronic systolic heart failure (HCC)   PVD (peripheral vascular disease) (HCC)   Osteomyelitis and gangrene right great toe -Currently on intravenous antibiotics -Podiatry following and patient underwent amputation of his toe on 4/13 -Plans are to return to the operating room on 4/15 for further debridement and closure of wound -Seen by vascular surgery and it was not felt that he needed further revascularization at this point -Blood cultures in process -Leukocytosis trending down -Intraoperative cultures show gram-positive cocci and gram-negative rods -Continue broad-spectrum antibiotics for now  Acute kidney injury -Baseline creatinine of 1.1 -Admitted with creatinine of 1.6 -He has been started on IV fluids with slow improvement of renal function -Continue current treatments   Diabetes, type II, uncontrolled, with hyperglycemia -Holding home doses of Metformin and Trulicity -Currently on sliding scale insulin -Blood sugars currently stable  Peripheral vascular disease -Recently had Abdominal Aortic Angiogram with Bi-Iliofemoral Runoff, Right TP trunk and peroneal artery  orbital atherectomy and balloon angioplasty And Right popliteal artery drug-coated balloon angioplasty on 4/6 by Dr. Kirke Corin -He has been on aspirin and Plavix -Seen by vascular surgery and it was not felt that further revascularization was indicated at this point  Hypertension -Continue on Coreg, losartan -Blood pressure elevated -Use hydralazine as needed -Coreg dose increased to 6.25 mg this morning  Obesity, class III -BMI 50.6 -Counseled on importance of diet and exercise     DVT prophylaxis: Lovenox  Code Status: Full code Family Communication: Discussed with wife at the bedside Disposition Plan: Status is: Inpatient  Remains inpatient appropriate because:Inpatient level of care appropriate due to severity of illness   Dispo: The patient is from: Home              Anticipated d/c is to: Home              Patient currently is not medically stable to d/c.   Difficult to place patient No    Consultants:   Podiatry  Vascular surgery  Procedures:   4/13:   1) Incision and drainage complex bursal infection below the deep fascia, foot             2) Amputation right great toe  Antimicrobials:   Unasyn 4/13>  Vancomycin 4/12>   Subjective: Reports having some pain in his foot earlier today, but reports that pain medicine has been helpful  Objective: Vitals:   07/22/20 0839 07/22/20 0840 07/22/20 1445 07/22/20 1643  BP: (!) 194/101 (!) 189/93 (!) 160/83 (!) 181/87  Pulse: 92 92 90 90  Resp: 18 18 19    Temp: 98.7 F (37.1 C) 98.7 F (37.1 C) 98.6 F (37 C)   TempSrc: Oral Oral Oral   SpO2: 97% 98% 95%  Weight:      Height:        Intake/Output Summary (Last 24 hours) at 07/22/2020 1854 Last data filed at 07/22/2020 1511 Gross per 24 hour  Intake 2901.26 ml  Output 1625 ml  Net 1276.26 ml   Filed Weights   07/20/20 1828 07/21/20 0439  Weight: (!) 154.2 kg (!) 155.7 kg    Examination:  General exam: Alert, awake, oriented x 3 Respiratory  system: Clear to auscultation. Respiratory effort normal. Cardiovascular system:RRR. No murmurs, rubs, gallops. Gastrointestinal system: Abdomen is nondistended, soft and nontender. No organomegaly or masses felt. Normal bowel sounds heard. Central nervous system: Alert and oriented. No focal neurological deficits. Extremities: right foot is wrapped in bandage Skin: No rashes, lesions or ulcers Psychiatry: Judgement and insight appear normal. Mood & affect appropriate.      Data Reviewed: I have personally reviewed following labs and imaging studies  CBC: Recent Labs  Lab 07/20/20 1839 07/21/20 0114 07/22/20 0644  WBC 23.7* 19.9* 15.9*  NEUTROABS 19.2*  --   --   HGB 11.7* 11.1* 11.1*  HCT 36.8* 35.3* 35.5*  MCV 89.5 91.0 89.6  PLT 373 370 350   Basic Metabolic Panel: Recent Labs  Lab 07/20/20 1839 07/21/20 0114 07/22/20 0306  NA 133* 134* 132*  K 4.0 4.1 4.4  CL 96* 96* 97*  CO2 27 27 27   GLUCOSE 180* 172* 184*  BUN 24* 23* 12  CREATININE 1.63* 1.45* 1.13  CALCIUM 9.1 8.8* 8.3*   GFR: Estimated Creatinine Clearance: 112 mL/min (by C-G formula based on SCr of 1.13 mg/dL). Liver Function Tests: Recent Labs  Lab 07/21/20 0114 07/22/20 0306  AST 20 19  ALT 16 17  ALKPHOS 70 62  BILITOT 1.0 0.7  PROT 7.6 6.9  ALBUMIN 2.4* 2.1*   No results for input(s): LIPASE, AMYLASE in the last 168 hours. No results for input(s): AMMONIA in the last 168 hours. Coagulation Profile: No results for input(s): INR, PROTIME in the last 168 hours. Cardiac Enzymes: No results for input(s): CKTOTAL, CKMB, CKMBINDEX, TROPONINI in the last 168 hours. BNP (last 3 results) No results for input(s): PROBNP in the last 8760 hours. HbA1C: No results for input(s): HGBA1C in the last 72 hours. CBG: Recent Labs  Lab 07/21/20 2217 07/21/20 2315 07/22/20 0806 07/22/20 1205 07/22/20 1758  GLUCAP 117* 124* 129* 166* 154*   Lipid Profile: No results for input(s): CHOL, HDL, LDLCALC,  TRIG, CHOLHDL, LDLDIRECT in the last 72 hours. Thyroid Function Tests: No results for input(s): TSH, T4TOTAL, FREET4, T3FREE, THYROIDAB in the last 72 hours. Anemia Panel: No results for input(s): VITAMINB12, FOLATE, FERRITIN, TIBC, IRON, RETICCTPCT in the last 72 hours. Sepsis Labs: Recent Labs  Lab 07/20/20 1839 07/20/20 2113  LATICACIDVEN 2.1* 1.4    Recent Results (from the past 240 hour(s))  SARS Coronavirus 2 by RT PCR (hospital order, performed in [facility] hospital lab) Nasopharyngeal Nasopharyngeal Swab     Status: None   Collection Time: 07/14/20  7:51 AM   Specimen: Nasopharyngeal Swab  Result Value Ref Range Status   SARS Coronavirus 2 NEGATIVE NEGATIVE Final    Comment: (NOTE) SARS-CoV-2 target nucleic acids are NOT DETECTED.  The SARS-CoV-2 RNA is generally detectable in upper and lower respiratory specimens during the acute phase of infection. The lowest concentration of SARS-CoV-2 viral copies this assay can detect is 250 copies / mL. A negative result does not preclude SARS-CoV-2 infection and should not be used as the sole basis for  treatment or other patient management decisions.  A negative result may occur with improper specimen collection / handling, submission of specimen other than nasopharyngeal swab, presence of viral mutation(s) within the areas targeted by this assay, and inadequate number of viral copies (<250 copies / mL). A negative result must be combined with clinical observations, patient history, and epidemiological information.  Fact Sheet for Patients:   BoilerBrush.com.cy  Fact Sheet for Healthcare Providers: https://pope.com/  This test is not yet approved or  cleared by the [place] FDA and has been authorized for detection and/or diagnosis of SARS-CoV-2 by FDA under an Emergency Use Authorization (EUA).  This EUA will remain in effect (meaning this test can be used) for the  duration of the COVID-19 declaration under Section 564(b)(1) of the Act, 21 U.S.C. section 360bbb-3(b)(1), unless the authorization is terminated or revoked sooner.  Performed at [facility], [address] [place], [place] [address]   Resp Panel by RT-PCR (Flu A&B, Covid) Nasopharyngeal Swab     Status: None   Collection Time: 07/20/20  6:30 PM   Specimen: Nasopharyngeal Swab; Nasopharyngeal(NP) swabs in vial transport medium  Result Value Ref Range Status   SARS Coronavirus 2 by RT PCR NEGATIVE NEGATIVE Final    Comment: (NOTE) SARS-CoV-2 target nucleic acids are NOT DETECTED.  The SARS-CoV-2 RNA is generally detectable in upper respiratory specimens during the acute phase of infection. The lowest concentration of SARS-CoV-2 viral copies this assay can detect is 138 copies/mL. A negative result does not preclude SARS-Cov-2 infection and should not be used as the sole basis for treatment or other patient management decisions. A negative result may occur with  improper specimen collection/handling, submission of specimen other than nasopharyngeal swab, presence of viral mutation(s) within the areas targeted by this assay, and inadequate number of viral copies(<138 copies/mL). A negative result must be combined with clinical observations, patient history, and epidemiological information. The expected result is Negative.  Fact Sheet for Patients:  BloggerCourse.com  Fact Sheet for Healthcare Providers:  SeriousBroker.it  This test is no t yet approved or cleared by the [place] FDA and  has been authorized for detection and/or diagnosis of SARS-CoV-2 by FDA under an Emergency Use Authorization (EUA). This EUA will remain  in effect (meaning this test can be used) for the duration of the COVID-19 declaration under Section 564(b)(1) of the Act, 21 U.S.C.section 360bbb-3(b)(1), unless the authorization is terminated  or  revoked sooner.       Influenza A by PCR NEGATIVE NEGATIVE Final   Influenza B by PCR NEGATIVE NEGATIVE Final    Comment: (NOTE) The Xpert Xpress SARS-CoV-2/FLU/RSV plus assay is intended as an aid in the diagnosis of influenza from Nasopharyngeal swab specimens and should not be used as a sole basis for treatment. Nasal washings and aspirates are unacceptable for Xpert Xpress SARS-CoV-2/FLU/RSV testing.  Fact Sheet for Patients: BloggerCourse.com  Fact Sheet for Healthcare Providers: SeriousBroker.it  This test is not yet approved or cleared by the [place] FDA and has been authorized for detection and/or diagnosis of SARS-CoV-2 by FDA under an Emergency Use Authorization (EUA). This EUA will remain in effect (meaning this test can be used) for the duration of the COVID-19 declaration under Section 564(b)(1) of the Act, 21 U.S.C. section 360bbb-3(b)(1), unless the authorization is terminated or revoked.  Performed at [facility], [address], [place], [place] [address]   Blood culture (routine x 2)     Status: None (  Preliminary result)   Collection Time: 07/20/20  8:42 PM   Specimen: BLOOD  Result Value Ref Range Status   Specimen Description BLOOD SITE NOT SPECIFIED  Final   Special Requests   Final    BOTTLES DRAWN AEROBIC AND ANAEROBIC Blood Culture adequate volume   Culture   Final    NO GROWTH 2 DAYS Performed at [facility], [address] [place], [place] [address]    Report Status PENDING  Incomplete  Blood culture (routine x 2)     Status: None (Preliminary result)   Collection Time: 07/20/20  9:13 PM   Specimen: BLOOD  Result Value Ref Range Status   Specimen Description BLOOD SITE NOT SPECIFIED  Final   Special Requests   Final    BOTTLES DRAWN AEROBIC AND ANAEROBIC Blood Culture adequate volume   Culture   Final    NO GROWTH 2 DAYS Performed at [facility], [address] [place], [place] [address]    Report Status PENDING  Incomplete  Surgical pcr screen     Status: None   Collection Time: 07/21/20  4:50 PM   Specimen: Nasal Mucosa; Nasal Swab  Result Value Ref Range Status   MRSA, PCR NEGATIVE NEGATIVE Final   Staphylococcus aureus NEGATIVE NEGATIVE Final    Comment: (NOTE) The Xpert SA Assay (FDA approved for NASAL specimens in patients 79 years of age and older), is one component of a comprehensive surveillance program. It is not intended to diagnose infection nor to guide or monitor treatment. Performed at [facility], [address], [place], [place] [address]   Aerobic/Anaerobic Culture w Gram Stain (surgical/deep wound)     Status: None (Preliminary result)   Collection Time: 07/21/20  9:49 PM   Specimen: Wound; Tissue  Result Value Ref Range Status   Specimen Description WOUND  Final   Special Requests RIGHT GREAT TOE  Final   Gram Stain   Final    MODERATE WBC PRESENT, PREDOMINANTLY PMN ABUNDANT GRAM POSITIVE COCCI ABUNDANT GRAM NEGATIVE RODS RARE GRAM VARIABLE ROD RESULT CALLED TO, READ BACK BY AND VERIFIED WITH: DR Samuella Cota 07/21/20 2303 JDW    Culture   Final    TOO YOUNG TO READ Performed at [facility], [address], [place], [place] [address]    Report Status PENDING  Incomplete         Radiology Studies: MR FOOT RIGHT W WO CONTRAST  Result Date: 07/21/2020 CLINICAL DATA:  Open wound involving the great toe. EXAM: MRI OF THE RIGHT FOREFOOT WITHOUT AND WITH CONTRAST TECHNIQUE: Multiplanar, multisequence MR imaging of the right foot was performed before and after the administration of intravenous contrast. CONTRAST:  68mL GADAVIST GADOBUTROL 1 MMOL/ML IV SOLN COMPARISON:  Radiographs 07/20/2020 FINDINGS: There is a open wound noted along the medial aspect of the great toe which appears to go right down close to the cortex of the bone near the level of the interphalangeal joint. Associated surrounding soft tissue  swelling/edema/fluid consistent with cellulitis. No discrete rim enhancing abscess is identified. There is also a second smaller open wound on the dorsal aspect of the toe. Abnormal T1 and T2 signal intensity in the distal phalanx consistent with osteomyelitis. There is also mild enhancement. Could not exclude septic arthritis at the interphalangeal joint. Diffuse myofasciitis without findings for pyomyositis. IMPRESSION: 1. Open wounds along the medial and dorsal aspect of the great toe. Associated cellulitis and myofasciitis without findings for discrete  drainable soft tissue abscess or pyomyositis. 2. MR findings consistent with osteomyelitis involving the distal phalanx of the great toe. Possible septic arthritis at the interphalangeal joint. Electronically Signed   By: Rudie Meyer M.D.   On: 07/21/2020 07:58   DG Foot 2 Views Right  Result Date: 07/21/2020 CLINICAL DATA:  Postop. EXAM: RIGHT FOOT - 2 VIEW COMPARISON:  Preoperative imaging. FINDINGS: Interval resection of the great toe. Overlying dressing in the operative bed. Remainder the foot is unchanged. There are vascular calcifications. IMPRESSION: Interval resection of the great toe. Electronically Signed   By: Narda Rutherford M.D.   On: 07/21/2020 22:36   DG Foot Complete Right  Result Date: 07/20/2020 CLINICAL DATA:  Right foot ulceration, pain EXAM: RIGHT FOOT COMPLETE - 3+ VIEW COMPARISON:  06/29/2020 FINDINGS: Frontal, oblique, and lateral views of the right foot are obtained. Soft tissue ulceration medial aspect first digit has progressed since prior study, with subcutaneous gas identified. Erosive changes are seen at the base of the first distal phalanx compatible with osteomyelitis. There are no acute displaced fractures. Diffuse vascular calcifications are noted. IMPRESSION: 1. Progressive soft tissue ulceration of the first digit, with osteomyelitis of the first distal phalanx. Electronically Signed   By: Sharlet Salina M.D.   On:  07/20/2020 21:25   VAS [place] LOWER EXTREMITY ARTERIAL DUPLEX  Result Date: 07/21/2020 LOWER EXTREMITY ARTERIAL DUPLEX STUDY Indications: Ulceration, peripheral artery disease, and Evaluate patency of TP              trunk/peroneal intervention.  Vascular Interventions: 07-14-2020 Right TP trunk and peroneal atherectomy and                         balloon angioplasty. Right poplital balloon angioplasty. Current ABI:            07-08-2020 ABI was RT 0.62 LT 0.87 Limitations: Comparison Study: 07-08-2020 Prior lower extremity arterial bilateral study                   showed monophasic waveforms in the right TP trunk, ATA, and                   peroneal. Performing Technologist: Jean Rosenthal RDMS,RVT  Examination Guidelines: A complete evaluation includes B-mode imaging, spectral Doppler, color Doppler, and power Doppler as needed of all accessible portions of each vessel. Bilateral testing is considered an integral part of a complete examination. Limited examinations for reoccurring indications may be performed as noted.  +-----------+--------+-----+---------------+----------+--------+ RIGHT      PSV cm/sRatioStenosis       Waveform  Comments +-----------+--------+-----+---------------+----------+--------+ CFA Prox   144                         triphasic          +-----------+--------+-----+---------------+----------+--------+ CFA Mid    130                         triphasic          +-----------+--------+-----+---------------+----------+--------+ CFA Distal 119                         triphasic          +-----------+--------+-----+---------------+----------+--------+ DFA        77  biphasic           +-----------+--------+-----+---------------+----------+--------+ SFA Prox   152                         triphasic          +-----------+--------+-----+---------------+----------+--------+ SFA Mid    144                         triphasic           +-----------+--------+-----+---------------+----------+--------+ SFA Distal 114                         triphasic          +-----------+--------+-----+---------------+----------+--------+ POP Prox   166                         triphasic          +-----------+--------+-----+---------------+----------+--------+ POP Mid    61                          monophasic         +-----------+--------+-----+---------------+----------+--------+ POP Distal 84                          monophasic         +-----------+--------+-----+---------------+----------+--------+ TP Trunk   354          50-74% stenosis                   +-----------+--------+-----+---------------+----------+--------+ ATA Prox   40                          monophasic         +-----------+--------+-----+---------------+----------+--------+ ATA Mid    45                          monophasic         +-----------+--------+-----+---------------+----------+--------+ ATA Distal 63                          monophasic         +-----------+--------+-----+---------------+----------+--------+ PTA Prox   147                                            +-----------+--------+-----+---------------+----------+--------+ PTA Distal 140                         monophasic         +-----------+--------+-----+---------------+----------+--------+ PERO Prox  123                         monophasic         +-----------+--------+-----+---------------+----------+--------+ PERO Mid   144                         monophasic         +-----------+--------+-----+---------------+----------+--------+ PERO Distal116                         monophasic         +-----------+--------+-----+---------------+----------+--------+  DP         78                          monophasic         +-----------+--------+-----+---------------+----------+--------+   Summary: Right: 30-49% stenosis noted in the popliteal artery.  50-74% stenosis noted in the TP trunk. Monophasic waveforms in the DPA, PTA, and Pero A.  See table(s) above for measurements and observations. Electronically signed by Sherald Hesshristopher Clark MD on 07/21/2020 at 5:42:49 PM.    Final         Scheduled Meds: . atorvastatin  20 mg Oral Daily  . carvedilol  6.25 mg Oral BID WC  . clopidogrel  75 mg Oral Daily  . enoxaparin (LOVENOX) injection  70 mg Subcutaneous Q24H  . insulin aspart  0-15 Units Subcutaneous TID WC  . insulin aspart  0-5 Units Subcutaneous QHS  . losartan  25 mg Oral Daily   Continuous Infusions: . ampicillin-sulbactam (UNASYN) IV 3 g (07/22/20 1429)  . lactated ringers 100 mL/hr at 07/22/20 0640  . vancomycin 1,250 mg (07/22/20 1426)     LOS: 2 days    Time spent: 35mins    Erick BlinksJehanzeb Sammy Cassar, MD [facility]   If 7PM-7AM, please contact night-coverage [email or web address]  07/22/2020, 6:54 PM

## 2020-07-22 NOTE — Progress Notes (Signed)
Pharmacy Antibiotic Note  Rod Shenberger is a 55 y.o. male admitted on 07/20/2020 with RLE gangrene s/p I&D and amputation.  Pharmacy has been consulted for vancomycin dosing. Patient is also on Unasyn per MD.   Renal function improved from admission. Will adjust vancomycin dose.   4/14 Vancomycin 1250mg  Q 12 hr Scr used: 1.13 mg/dL Weight: kg Vd coeff: 0.5 L/kg Est AUC: 480  Plan: Increase vancomycin to 1250 mg IV Q 12 hrs  Continue Unasyn per team  Monitor cultures, clinical status, renal fx Narrow abx as able and f/u duration    Height: 5\' 9"  (175.3 cm) Weight: (!) 155.7 kg (343 lb 4.1 oz) IBW/kg (Calculated) : 70.7  Temp (24hrs), Avg:98.5 F (36.9 C), Min:97.7 F (36.5 C), Max:99.4 F (37.4 C)  Recent Labs  Lab 07/20/20 1839 07/20/20 2113 07/21/20 0114 07/22/20 0306 07/22/20 0644  WBC 23.7*  --  19.9*  --  15.9*  CREATININE 1.63*  --  1.45* 1.13  --   LATICACIDVEN 2.1* 1.4  --   --   --     Estimated Creatinine Clearance: 112 mL/min (by C-G formula based on SCr of 1.13 mg/dL).    Allergies  Allergen Reactions  . Lipitor [Atorvastatin]     intolerant    Antimicrobials this admission: Zosyn 4/12 x1 Vancomycin 4/12 >>  Unasyn 4/13 >>  Dose adjustments this admission: 4/14 Vanc 1750mg  q24hr > 1250mg  Q12hr   Microbiology results: 4/12 BCx: ngtd  4/12 COVID PCR: neg 4/13 OR toe cx abundant GPC + GNR + GvariableR  Thank you for allowing pharmacy to be a part of this patient's care.   , PharmD, BCPS, BCCP Clinical Pharmacist  Please check AMION for all [facility] Pharmacy phone numbers After 10:00 PM, call Main Pharmacy [phone]

## 2020-07-22 NOTE — H&P (View-Only) (Signed)
  Subjective:  Patient ID: Austin Shaw, male    DOB: 05/02/1965,  MRN: [id number]  Feeling OK, here with his wife today. Anxious about dressing change.   Negative for chest pain and shortness of breath Fever: no Night sweats: no Constitutional signs: no Objective:   Vitals:   07/22/20 1800 07/22/20 2053  BP: (!) 167/81 (!) 178/84  Pulse: 87 88  Resp:  19  Temp:  98.3 F (36.8 C)  SpO2:  94%   General AA&O x3. Normal mood and affect.  Vascular Brisk capillary refill to all digits. Pedal hair present.  Neurologic Epicritic sensation grossly absent.  Dermatologic Incision clean no purulence or malodor  Orthopedic: Motor intact      Assessment & Plan:  Patient was evaluated and treated and all questions answered.  Diabetic foot infection s/p partial ray amputation right -Return to OR tomorrow 4/15 with Dr Price or myself -NPO past midnight -Daily dressing changes -WBAT in surgical shoe, try to minimize ambulation as much as possible  Austin Shaw, DPM  Accessible via secure chat for questions or concerns.  

## 2020-07-22 NOTE — Anesthesia Postprocedure Evaluation (Signed)
Anesthesia Post Note  Patient: Austin Shaw  Procedure(s) Performed: AMPUTATION RIGHT GREAT TOE (Right Toe) IRRIGATION AND DEBRIDEMENT RIGHT GREAT TOE (Right Toe)     Patient location during evaluation: PACU Anesthesia Type: MAC Level of consciousness: awake and alert Pain management: pain level controlled Vital Signs Assessment: post-procedure vital signs reviewed and stable Respiratory status: spontaneous breathing, nonlabored ventilation, respiratory function stable and patient connected to nasal cannula oxygen Cardiovascular status: stable and blood pressure returned to baseline Postop Assessment: no apparent nausea or vomiting Anesthetic complications: no   No complications documented.  Last Vitals:  Vitals:   07/22/20 0159 07/22/20 0547  BP: (!) 158/80 (!) 155/85  Pulse: 85 81  Resp: 18 15  Temp: 36.8 C 36.6 C  SpO2: 95% 96%    Last Pain:  Vitals:   07/22/20 0616  TempSrc:   PainSc: 6                  Tiajuana Amass

## 2020-07-22 NOTE — Progress Notes (Addendum)
  Progress Note    07/22/2020 7:46 AM 1 Day Post-Op  Subjective:  No complaints this morning   Vitals:   07/22/20 0159 07/22/20 0547  BP: (!) 158/80 (!) 155/85  Pulse: 85 81  Resp: 18 15  Temp: 98.2 F (36.8 C) 97.8 F (36.6 C)  SpO2: 95% 96%   Physical Exam: Lungs:  Non labored Incisions:  Bulky dressing R foot left in place Abdomen:  soft Neurologic: a&O  CBC    Component Value Date/Time   WBC 15.9 (H) 07/22/2020 0644   RBC 3.96 (L) 07/22/2020 0644   HGB 11.1 (L) 07/22/2020 0644   HGB 12.1 (L) 07/08/2020 1034   HCT 35.5 (L) 07/22/2020 0644   HCT 36.1 (L) 07/08/2020 1034   PLT 350 07/22/2020 0644   PLT 240 07/08/2020 1034   MCV 89.6 07/22/2020 0644   MCV 85 07/08/2020 1034   MCH 28.0 07/22/2020 0644   MCHC 31.3 07/22/2020 0644   RDW 13.2 07/22/2020 0644   RDW 12.4 07/08/2020 1034   LYMPHSABS 2.5 07/20/2020 1839   LYMPHSABS 2.0 07/08/2020 1034   MONOABS 1.5 (H) 07/20/2020 1839   EOSABS 0.1 07/20/2020 1839   EOSABS 0.1 07/08/2020 1034   BASOSABS 0.1 07/20/2020 1839   BASOSABS 0.1 07/08/2020 1034    BMET    Component Value Date/Time   NA 132 (L) 07/22/2020 0306   NA 138 07/08/2020 1034   K 4.4 07/22/2020 0306   CL 97 (L) 07/22/2020 0306   CO2 27 07/22/2020 0306   GLUCOSE 184 (H) 07/22/2020 0306   BUN 12 07/22/2020 0306   BUN 19 07/08/2020 1034   CREATININE 1.13 07/22/2020 0306   CREATININE 1.10 10/29/2015 0834   CALCIUM 8.3 (L) 07/22/2020 0306   GFRNONAA >60 07/22/2020 0306   GFRAA >60 05/15/2018 0310    INR No results found for: INR   Intake/Output Summary (Last 24 hours) at 07/22/2020 0746 Last data filed at 07/22/2020 0636 Gross per 24 hour  Intake 3380.88 ml  Output 1925 ml  Net 1455.88 ml     Assessment/Plan:  55 y.o. male is s/p atherectomy and balloon angioplasty of Tp trunk and peroneal artery with subsequent GT amputation 1 Day Post-Op   -Arterial duplex shows a patent tp trunk and peroneal despite elevated velocities of tp  trunk -Plans noted to return to OR tomorrow with podiatry for further debridement and definitive closer -Could consider retrograde tibial intervention if amputation does not heal per Dr. Chestine Spore -Vascular available if needed   Emilie Rutter, PA-C [facility] [phone] 07/22/2020 7:46 AM  I have seen and evaluated the patient. I agree with the PA note as documented above.  Duplex yesterday confirmeed flow in the TP trunk and peroneal artery that was recently recanalized by Dr. Kirke Corin.  There is residual moderate stenosis in the TP trunk but given brisk Doppler flow at the ankle and evidence of bleeding in the OR yesterday I would not offer additional revascularization unless he has wound healing problems and then could consider reintervention versus retrograde intervention.[name]  He is going back to the OR for debridement given extensive infection tomorrow with podiatry.  Cephus Shelling, MD [facility] Office: [phone]

## 2020-07-22 NOTE — Progress Notes (Signed)
Patient came back from OR, S/P (I&D; Amputation of R Great toe). Pt is alert and verbal, no acute distress noted. No DOB/SOB noted. R foot surgical site, dsg C/D/I covered with compression wrap. Pt has c/o 8/10 pain-PRN pain med was given. VS 172/103 PR89 RR18 T97.7 RR18 95%RA . Also PRN Hydralazine given for elevated BP.  Call bell within reach, will continue to monitor patient.

## 2020-07-22 NOTE — Progress Notes (Signed)
  Subjective:  Patient ID: Austin Shaw, male    DOB: 03-Apr-1966,  MRN: [id number]  Feeling OK, here with his wife today. Anxious about dressing change.   Negative for chest pain and shortness of breath Fever: no Night sweats: no Constitutional signs: no Objective:   Vitals:   07/22/20 1800 07/22/20 2053  BP: (!) 167/81 (!) 178/84  Pulse: 87 88  Resp:  19  Temp:  98.3 F (36.8 C)  SpO2:  94%   General AA&O x3. Normal mood and affect.  Vascular Brisk capillary refill to all digits. Pedal hair present.  Neurologic Epicritic sensation grossly absent.  Dermatologic Incision clean no purulence or malodor  Orthopedic: Motor intact      Assessment & Plan:  Patient was evaluated and treated and all questions answered.  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

## 2020-07-23 ENCOUNTER — Telehealth: Payer: Self-pay | Admitting: Podiatry

## 2020-07-23 ENCOUNTER — Inpatient Hospital Stay (HOSPITAL_COMMUNITY): Payer: BC Managed Care – PPO | Admitting: Certified Registered"

## 2020-07-23 ENCOUNTER — Inpatient Hospital Stay (HOSPITAL_COMMUNITY): Admission: RE | Admit: 2020-07-23 | Payer: Self-pay | Source: Ambulatory Visit

## 2020-07-23 ENCOUNTER — Encounter (HOSPITAL_COMMUNITY): Payer: Self-pay | Admitting: Internal Medicine

## 2020-07-23 ENCOUNTER — Encounter (HOSPITAL_COMMUNITY)
Admission: EM | Disposition: A | Discharge status: Home Health | Payer: Self-pay | Source: Home / Self Care | Attending: Internal Medicine

## 2020-07-23 ENCOUNTER — Inpatient Hospital Stay (HOSPITAL_COMMUNITY): Payer: BC Managed Care – PPO

## 2020-07-23 DIAGNOSIS — M86671 Other chronic osteomyelitis, right ankle and foot: Secondary | ICD-10-CM

## 2020-07-23 HISTORY — PX: METATARSAL HEAD EXCISION: SHX5027

## 2020-07-23 HISTORY — PX: WOUND DEBRIDEMENT: SHX247

## 2020-07-23 LAB — CBC
HCT: 32.2 % — ABNORMAL LOW (ref 39.0–52.0)
Hemoglobin: 10.2 g/dL — ABNORMAL LOW (ref 13.0–17.0)
MCH: 28.5 pg (ref 26.0–34.0)
MCHC: 31.7 g/dL (ref 30.0–36.0)
MCV: 89.9 fL (ref 80.0–100.0)
Platelets: 347 10*3/uL (ref 150–400)
RBC: 3.58 MIL/uL — ABNORMAL LOW (ref 4.22–5.81)
RDW: 13.3 % (ref 11.5–15.5)
WBC: 15.2 10*3/uL — ABNORMAL HIGH (ref 4.0–10.5)
nRBC: 0 % (ref 0.0–0.2)

## 2020-07-23 LAB — GLUCOSE, CAPILLARY
Glucose-Capillary: 199 mg/dL — ABNORMAL HIGH (ref 70–99)
Glucose-Capillary: 209 mg/dL — ABNORMAL HIGH (ref 70–99)
Glucose-Capillary: 171 mg/dL — ABNORMAL HIGH (ref 70–99)
Glucose-Capillary: 178 mg/dL — ABNORMAL HIGH (ref 70–99)
Glucose-Capillary: 208 mg/dL — ABNORMAL HIGH (ref 70–99)

## 2020-07-23 LAB — SURGICAL PATHOLOGY

## 2020-07-23 SURGERY — DEBRIDEMENT, WOUND
Anesthesia: Monitor Anesthesia Care | Site: Toe | Laterality: Right

## 2020-07-23 MED ORDER — VANCOMYCIN HCL 1000 MG IV SOLR
INTRAVENOUS | Status: DC | PRN
  Administered 2020-07-23: 1000 mg via TOPICAL

## 2020-07-23 MED ORDER — MIDAZOLAM HCL 5 MG/5ML IJ SOLN
INTRAMUSCULAR | Status: DC | PRN
  Administered 2020-07-23: 2 mg via INTRAVENOUS

## 2020-07-23 MED ORDER — BUPIVACAINE HCL (PF) 0.25 % IJ SOLN
INTRAMUSCULAR | Status: AC
  Filled 2020-07-23: qty 30

## 2020-07-23 MED ORDER — ENSURE PRE-SURGERY PO LIQD: 296.0000 mL | Freq: Once | ORAL | Status: DC

## 2020-07-23 MED ORDER — FENTANYL CITRATE (PF) 100 MCG/2ML IJ SOLN
INTRAMUSCULAR | Status: DC | PRN
  Administered 2020-07-23 (×2): 25 ug via INTRAVENOUS

## 2020-07-23 MED ORDER — OXYCODONE HCL 5 MG/5ML PO SOLN: 5.0000 mg | Freq: Once | ORAL | Status: DC | PRN

## 2020-07-23 MED ORDER — MIDAZOLAM HCL 2 MG/2ML IJ SOLN
INTRAMUSCULAR | Status: AC
  Filled 2020-07-23: qty 2

## 2020-07-23 MED ORDER — PROMETHAZINE HCL 25 MG/ML IJ SOLN: 6.2500 mg | INTRAMUSCULAR | Status: DC | PRN

## 2020-07-23 MED ORDER — FENTANYL CITRATE (PF) 100 MCG/2ML IJ SOLN
25.0000 ug | INTRAMUSCULAR | Status: DC | PRN
  Administered 2020-07-23: 50 ug via INTRAVENOUS

## 2020-07-23 MED ORDER — VANCOMYCIN HCL 1000 MG IV SOLR
INTRAVENOUS | Status: AC
  Filled 2020-07-23: qty 1000

## 2020-07-23 MED ORDER — PHENYLEPHRINE 40 MCG/ML (10ML) SYRINGE FOR IV PUSH (FOR BLOOD PRESSURE SUPPORT)
PREFILLED_SYRINGE | INTRAVENOUS | Status: DC | PRN
  Administered 2020-07-23 (×2): 80 ug via INTRAVENOUS

## 2020-07-23 MED ORDER — BUPIVACAINE HCL (PF) 0.25 % IJ SOLN
INTRAMUSCULAR | Status: DC | PRN
  Administered 2020-07-23: 20 mL

## 2020-07-23 MED ORDER — ONDANSETRON HCL 4 MG/2ML IJ SOLN
INTRAMUSCULAR | Status: DC | PRN
  Administered 2020-07-23: 4 mg via INTRAVENOUS

## 2020-07-23 MED ORDER — LACTATED RINGERS IV SOLN: INTRAVENOUS | Status: DC

## 2020-07-23 MED ORDER — SODIUM CHLORIDE 0.9 % IV SOLN
2.0000 g | INTRAVENOUS | Status: DC
  Administered 2020-07-23 – 2020-07-25 (×3): 2 g via INTRAVENOUS
  Filled 2020-07-23: qty 2
  Filled 2020-07-23: qty 20
  Filled 2020-07-23 (×2): qty 2

## 2020-07-23 MED ORDER — ORAL CARE MOUTH RINSE: 15.0000 mL | Freq: Once | OROMUCOSAL | Status: AC

## 2020-07-23 MED ORDER — CHLORHEXIDINE GLUCONATE 0.12 % MT SOLN
15.0000 mL | Freq: Once | OROMUCOSAL | Status: AC
  Administered 2020-07-23: 15 mL via OROMUCOSAL
  Filled 2020-07-23: qty 15

## 2020-07-23 MED ORDER — PROPOFOL 500 MG/50ML IV EMUL
INTRAVENOUS | Status: DC | PRN
  Administered 2020-07-23: 75 ug/kg/min via INTRAVENOUS

## 2020-07-23 MED ORDER — SODIUM CHLORIDE 0.9 % IR SOLN
Status: DC | PRN
  Administered 2020-07-23: 3000 mL

## 2020-07-23 MED ORDER — ENSURE PRE-SURGERY PO LIQD
296.0000 mL | Freq: Once | ORAL | Status: AC
  Administered 2020-07-23: 296 mL via ORAL
  Filled 2020-07-23: qty 296

## 2020-07-23 MED ORDER — FENTANYL CITRATE (PF) 100 MCG/2ML IJ SOLN
INTRAMUSCULAR | Status: AC
  Filled 2020-07-23: qty 2

## 2020-07-23 MED ORDER — FENTANYL CITRATE (PF) 250 MCG/5ML IJ SOLN
INTRAMUSCULAR | Status: AC
  Filled 2020-07-23: qty 5

## 2020-07-23 MED ORDER — OXYCODONE HCL 5 MG PO TABS: 5.0000 mg | ORAL_TABLET | Freq: Once | ORAL | Status: DC | PRN

## 2020-07-23 MED ORDER — 0.9 % SODIUM CHLORIDE (POUR BTL) OPTIME
TOPICAL | Status: DC | PRN
  Administered 2020-07-23: 1000 mL

## 2020-07-23 SURGICAL SUPPLY — 57 items
BLADE LONG MED 31X9 (MISCELLANEOUS) IMPLANT
BNDG ELASTIC 4X5.8 VLCR STR LF (GAUZE/BANDAGES/DRESSINGS) ×1 IMPLANT
BNDG ESMARK 4X9 LF (GAUZE/BANDAGES/DRESSINGS) IMPLANT
BNDG GAUZE ELAST 4 BULKY (GAUZE/BANDAGES/DRESSINGS) ×1 IMPLANT
CHLORAPREP W/TINT 26 (MISCELLANEOUS) ×2 IMPLANT
COVER SURGICAL LIGHT HANDLE (MISCELLANEOUS) ×2 IMPLANT
COVER WAND RF STERILE (DRAPES) ×2 IMPLANT
CUFF TOURN SGL QUICK 18X4 (TOURNIQUET CUFF) IMPLANT
CUFF TOURN SGL QUICK 24 (TOURNIQUET CUFF)
CUFF TOURN SGL QUICK 34 (TOURNIQUET CUFF)
CUFF TRNQT CYL 24X4X16.5-23 (TOURNIQUET CUFF) IMPLANT
CUFF TRNQT CYL 34X4.125X (TOURNIQUET CUFF) IMPLANT
DECANTER SPIKE VIAL GLASS SM (MISCELLANEOUS) ×1 IMPLANT
DRAPE SURG 17X23 STRL (DRAPES) ×2 IMPLANT
DRAPE U-SHAPE 47X51 STRL (DRAPES) ×1 IMPLANT
ELECT CAUTERY BLADE 6.4 (BLADE) ×2 IMPLANT
ELECT REM PT RETURN 9FT ADLT (ELECTROSURGICAL) ×2
ELECTRODE REM PT RTRN 9FT ADLT (ELECTROSURGICAL) ×1 IMPLANT
GAUZE SPONGE 2X2 8PLY STRL LF (GAUZE/BANDAGES/DRESSINGS) IMPLANT
GAUZE SPONGE 4X4 12PLY STRL (GAUZE/BANDAGES/DRESSINGS) ×1 IMPLANT
GAUZE XEROFORM 1X8 LF (GAUZE/BANDAGES/DRESSINGS) ×1 IMPLANT
GLOVE BIO SURGEON STRL SZ7.5 (GLOVE) ×2 IMPLANT
GLOVE SRG 8 PF TXTR STRL LF DI (GLOVE) ×1 IMPLANT
GLOVE SURG UNDER POLY LF SZ8 (GLOVE) ×1
GOWN STRL REUS W/ TWL LRG LVL3 (GOWN DISPOSABLE) ×2 IMPLANT
GOWN STRL REUS W/ TWL XL LVL3 (GOWN DISPOSABLE) ×1 IMPLANT
GOWN STRL REUS W/TWL LRG LVL3 (GOWN DISPOSABLE) ×2
GOWN STRL REUS W/TWL XL LVL3 (GOWN DISPOSABLE) ×1
HANDPIECE INTERPULSE COAX TIP (DISPOSABLE) ×1
KIT BASIN OR (CUSTOM PROCEDURE TRAY) ×2 IMPLANT
KIT TURNOVER KIT B (KITS) ×2 IMPLANT
MANIFOLD NEPTUNE II (INSTRUMENTS) ×2 IMPLANT
NDL BIOPSY JAMSHIDI 8X6 (NEEDLE) IMPLANT
NDL HYPO 25GX1X1/2 BEV (NEEDLE) IMPLANT
NEEDLE BIOPSY JAMSHIDI 8X6 (NEEDLE) IMPLANT
NEEDLE HYPO 25GX1X1/2 BEV (NEEDLE) IMPLANT
NS IRRIG 1000ML POUR BTL (IV SOLUTION) ×2 IMPLANT
PACK ORTHO EXTREMITY (CUSTOM PROCEDURE TRAY) ×2 IMPLANT
PAD ARMBOARD 7.5X6 YLW CONV (MISCELLANEOUS) ×4 IMPLANT
PROBE DEBRIDE SONICVAC MISONIX (TIP) IMPLANT
SET HNDPC FAN SPRY TIP SCT (DISPOSABLE) IMPLANT
SOL PREP POV-IOD 4OZ 10% (MISCELLANEOUS) ×6 IMPLANT
SPECIMEN JAR SMALL (MISCELLANEOUS) ×2 IMPLANT
SPONGE GAUZE 2X2 STER 10/PKG (GAUZE/BANDAGES/DRESSINGS)
STAPLER VISISTAT 35W (STAPLE) ×1 IMPLANT
SUT ETHILON 3 0 PS 1 (SUTURE) ×3 IMPLANT
SUT SILK 3 0 REEL (SUTURE) ×1 IMPLANT
SUT VIC AB 3-0 PS2 18 (SUTURE) ×1 IMPLANT
SUT VIC AB 3-0 SH 27 (SUTURE) ×1
SUT VIC AB 3-0 SH 27X BRD (SUTURE) IMPLANT
SYR CONTROL 10ML LL (SYRINGE) IMPLANT
TOWEL GREEN STERILE (TOWEL DISPOSABLE) ×2 IMPLANT
TOWEL GREEN STERILE FF (TOWEL DISPOSABLE) ×2 IMPLANT
TUBE CONNECTING 12X1/4 (SUCTIONS) ×2 IMPLANT
UNDERPAD 30X36 HEAVY ABSORB (UNDERPADS AND DIAPERS) ×2 IMPLANT
WATER STERILE IRR 1000ML POUR (IV SOLUTION) ×2 IMPLANT
YANKAUER SUCT BULB TIP NO VENT (SUCTIONS) ×2 IMPLANT

## 2020-07-23 NOTE — Op Note (Signed)
  Patient Name: Austin Shaw DOB: November 06, 1965  MRN: [id number]   Date of Surgery: 07/23/20  Surgeon: Dr. Hardie Pulley, DPM Assistants: None  Pre-operative Diagnosis:  * No Diagnosis Codes entered * Post-operative Diagnosis:  * No Diagnosis Codes entered * Procedures:  1) Delayed wound closure right foot wound, complex  2) Partial metatarsal resection 1st metatarsal and sesamoidectomy Pathology/Specimens: ID Type Source Tests Collected by Time Destination  A : Bone First Metatarsal of Right Foot  Tissue PATH Digit amputation FUNGUS CULTURE WITH STAIN, AEROBIC/ANAEROBIC CULTURE W GRAM STAIN (SURGICAL/DEEP WOUND), ACID FAST CULTURE WITH REFLEXED SENSITIVITIES (MYCOBACTERIA), ACID FAST SMEAR (AFB, MYCOBACTERIA), ANAEROBIC CULTURE W GRAM STAIN Evelina Bucy, DPM 07/23/2020 1154    Anesthesia: MAC/local Hemostasis: * No tourniquets in log * Estimated Blood Loss: 50 ml Materials: * No implants in log * Medications: 1g Vancomycin topical, 40m marcaine plain Complications: none  Indications for Procedure:  This is a 55y.o. male with a right foot wound s/p incision and drainage for severe diabetic foot infection. He presents today for planned repeat wound debridement and closure.   Procedure in Detail: Patient was identified in pre-operative holding area. Formal consent was signed and the right lower extremity was marked. Patient was brought back to the operating room. Anesthesia was induced. The extremity was prepped and draped in the usual sterile fashion. Timeout was taken to confirm patient name, laterality, and procedure prior to incision.   Attention was then directed to the right foot.  The previous incision area was opened and sutures were cut.  There was a moderate amount of necrosis in the wound bed but no continued purulence was noted.  The wound was then thoroughly irrigated with 2 L normal saline pulse lavage.  The wound was then excisionally debrided down to and including  to bone with a rongeur.  Given the extent of the wound necrosis the wound edges would not come together without further resection of the bone.  The first metatarsal and the first metatarsal was excised with a sagittal saw to allow for wound closure with care to leave a dorsal shelf for better ambulation.  The metatarsal had some necrosis at the distal aspect of the head and this was sent for microbiology.  The wound was then further excisionally debrided with a rongeur to bleeding viable wound edges.  The skin edges were freshened to ensure viable edges. Complex layered closure was performed with 3-0 Vicryl, 3-0 nylon and skin staples. The full incision closure was 10.5 cm  The foot was then dressed with Xeroform 4 x 4's Kerlix and Ace bandage. Patient tolerated the procedure well.  Disposition: Following a period of post-operative monitoring, patient will be transferred to the floor.

## 2020-07-23 NOTE — Transfer of Care (Signed)
Immediate Anesthesia Transfer of Care Note  Patient: Austin Shaw  Procedure(s) Performed: DEBRIDEMENT WOUND WITH DELAYED WOUND CLOSURE (Right Toe) METATARSAL HEAD EXCISION (Right Toe)  Patient Location: PACU  Anesthesia Type:MAC  Level of Consciousness: awake, alert  and oriented  Airway & Oxygen Therapy: Patient Spontanous Breathing  Post-op Assessment: Report given to RN and Post -op Vital signs reviewed and stable  Post vital signs: Reviewed and stable  Last Vitals:  Vitals Value Taken Time  BP 123/80 07/23/20 1240  Temp 36.9 C 07/23/20 1240  Pulse 87 07/23/20 1243  Resp 28 07/23/20 1243  SpO2 96 % 07/23/20 1243  Vitals shown include unvalidated device data.  Last Pain:  Vitals:   07/23/20 1240  TempSrc:   PainSc: 0-No pain      Patients Stated Pain Goal: 0 (55/97/41 [id number])  Complications: No complications documented.

## 2020-07-23 NOTE — Interval H&P Note (Signed)
History and Physical Interval Note:  07/23/2020 11:10 AM  Austin Shaw  has presented today for surgery, with the diagnosis of Gangrene, ulcer foot.  The various methods of treatment have been discussed with the patient and family. After consideration of risks, benefits and other options for treatment, the patient has consented to  Procedure(s): DEBRIDEMENT WOUND WITH DELAYED WOUND CLOSURE (Right) METATARSAL HEAD EXCISION (Right) as a surgical intervention.  The patient's history has been reviewed, patient examined, no change in status, stable for surgery.  I have reviewed the patient's chart and labs.  Questions were answered to the patient's satisfaction.     Park Liter

## 2020-07-23 NOTE — Anesthesia Preprocedure Evaluation (Addendum)
Anesthesia Evaluation  Patient identified by MRN, date of birth, ID band Patient awake    Reviewed: Allergy & Precautions, NPO status , Patient's Chart, lab work & pertinent test results, reviewed documented beta blocker date and time   History of Anesthesia Complications Negative for: history of anesthetic complications  Airway Mallampati: III  TM Distance: >3 FB Neck ROM: Full    Dental  (+) Dental Advisory Given, Chipped   Pulmonary sleep apnea ,    Pulmonary exam normal        Cardiovascular hypertension, Pt. on home beta blockers and Pt. on medications + Peripheral Vascular Disease  Normal cardiovascular exam   '16 TTE - Inferobasal hypokinesis. LV cavity size wasmildly dilated. Moderate LVH. EF 50% to 55%. Grade 1 diastolic dysfunction. Mild TR and PR.    Neuro/Psych negative neurological ROS  negative psych ROS   GI/Hepatic negative GI ROS, Neg liver ROS,   Endo/Other  diabetes, Type 2, Oral Hypoglycemic AgentsMorbid obesity Na 132 Cl 97 Ca 8.3   Renal/GU negative Renal ROS     Musculoskeletal negative musculoskeletal ROS (+)   Abdominal (+) + obese,   Peds  Hematology  (+) anemia ,  On plavix    Anesthesia Other Findings Covid test negative   Reproductive/Obstetrics                            Anesthesia Physical Anesthesia Plan  ASA: III  Anesthesia Plan: MAC   Post-op Pain Management:    Induction: Intravenous  PONV Risk Score and Plan: 2 and Propofol infusion and Treatment may vary due to age or medical condition  Airway Management Planned: Natural Airway and Simple Face Mask  Additional Equipment: None  Intra-op Plan:   Post-operative Plan:   Informed Consent: I have reviewed the patients History and Physical, chart, labs and discussed the procedure including the risks, benefits and alternatives for the proposed anesthesia with the patient or authorized  representative who has indicated his/her understanding and acceptance.       Plan Discussed with: CRNA and Anesthesiologist  Anesthesia Plan Comments:        Anesthesia Quick Evaluation

## 2020-07-23 NOTE — Anesthesia Postprocedure Evaluation (Signed)
Anesthesia Post Note  Patient: Austin Shaw  Procedure(s) Performed: DEBRIDEMENT WOUND WITH DELAYED WOUND CLOSURE (Right Toe) METATARSAL HEAD EXCISION (Right Toe)     Patient location during evaluation: PACU Anesthesia Type: MAC Level of consciousness: awake and alert Pain management: pain level controlled Vital Signs Assessment: post-procedure vital signs reviewed and stable Respiratory status: spontaneous breathing, nonlabored ventilation and respiratory function stable Cardiovascular status: stable and blood pressure returned to baseline Anesthetic complications: no   No complications documented.  Last Vitals:  Vitals:   07/23/20 1325 07/23/20 1340  BP: (!) 145/82 (!) 160/81  Pulse: 78 77  Resp: 11 15  Temp:  36.7 C  SpO2: 93% 94%    Last Pain:  Vitals:   07/23/20 1310  TempSrc:   PainSc: [name] Brizeyda Holtmeyer

## 2020-07-23 NOTE — Progress Notes (Signed)
PROGRESS NOTE    Austin Shaw  PYK:[id number] DOB: 1965-11-13 DOA: 07/20/2020 PCP: Joaquim Nam, MD    Brief Narrative:  55 year old male with history of diabetes, peripheral vascular disease, morbid obesity, hypertension, admitted to the hospital with right great toe osteomyelitis/gangrene.  He has been started on intravenous antibiotics.  Podiatry following and plans on likely amputation.  He recently had revascularization done of his right lower extremity.  Seen by vascular surgery who did not feel that any further revascularization was indicated at this time.   Assessment & Plan:   Principal Problem:   Gangrene of toe of right foot (HCC) Active Problems:   Diabetes mellitus type 2, uncontrolled, with complications (HCC)   OBESITY, MORBID   Essential hypertension   Cardiomyopathy (HCC)   Chronic systolic heart failure (HCC)   PVD (peripheral vascular disease) (HCC)   Osteomyelitis and gangrene right great toe -Currently on intravenous antibiotics -Podiatry following and patient underwent amputation of his toe on 4/13 -Plans are to return to the operating room on 4/15 for further debridement and closure of wound -Seen by vascular surgery and it was not felt that he needed further revascularization at this point -Blood cultures in process -Leukocytosis trending down -Intraoperative cultures show gram-positive cocci and gram-negative rods -Continue broad-spectrum antibiotics for now  Acute kidney injury -Baseline creatinine of 1.1 -Admitted with creatinine of 1.6 -He has been started on IV fluids with slow improvement of renal function -Continue current treatments   Diabetes, type II, uncontrolled, with hyperglycemia -Holding home doses of Metformin and Trulicity -Currently on sliding scale insulin -Blood sugars currently stable  Peripheral vascular disease -Recently had Abdominal Aortic Angiogram with Bi-Iliofemoral Runoff, Right TP trunk and peroneal artery  orbital atherectomy and balloon angioplasty And Right popliteal artery drug-coated balloon angioplasty on 4/6 by Dr. Kirke Corin -He has been on aspirin and Plavix -Seen by vascular surgery and it was not felt that further revascularization was indicated at this point  Hypertension -Continue on Coreg, losartan -Blood pressure stable -Use hydralazine as needed -Coreg dose increased to 6.25 mg this morning  Obesity, class III -BMI 50.6 -Counseled on importance of diet and exercise     DVT prophylaxis: Lovenox  Code Status: Full code Family Communication: Discussed with wife at the bedside Disposition Plan: Status is: Inpatient  Remains inpatient appropriate because:Inpatient level of care appropriate due to severity of illness   Dispo: The patient is from: Home              Anticipated d/c is to: Home              Patient currently is not medically stable to d/c.   Difficult to place patient No    Consultants:   Podiatry  Vascular surgery  Procedures:   4/13:   1) Incision and drainage complex bursal infection below the deep fascia, foot             2) Amputation right great toe  Antimicrobials:   Unasyn 4/13>  Vancomycin 4/12>   Subjective: Reports having some pain in his foot earlier today, but reports that pain medicine has been helpful  Objective: Vitals:   07/23/20 1240 07/23/20 1255 07/23/20 1310 07/23/20 1325  BP: 123/80 126/79 (!) 146/76 (!) 145/82  Pulse: 82 78 74 78  Resp: 18 16 12 11   Temp: 98.5 F (36.9 C)     TempSrc:      SpO2: 95% 95% 95% 93%  Weight:      Height:  Intake/Output Summary (Last 24 hours) at 07/23/2020 1336 Last data filed at 07/23/2020 1156 Gross per 24 hour  Intake 3867.05 ml  Output 1675 ml  Net 2192.05 ml   Filed Weights   07/20/20 1828 07/21/20 0439  Weight: (!) 154.2 kg (!) 155.7 kg    Examination:  General exam: Alert, awake, oriented x 3 Respiratory system: Clear to auscultation. Respiratory effort  normal. Cardiovascular system:RRR. No murmurs, rubs, gallops. Gastrointestinal system: Abdomen is nondistended, soft and nontender. No organomegaly or masses felt. Normal bowel sounds heard. Central nervous system: Alert and oriented. No focal neurological deficits. Extremities: right foot is wrapped in dressing Skin: No rashes, lesions or ulcers Psychiatry: Judgement and insight appear normal. Mood & affect appropriate.    Data Reviewed: I have personally reviewed following labs and imaging studies  CBC: Recent Labs  Lab 07/20/20 1839 07/21/20 0114 07/22/20 0644 07/23/20 0024  WBC 23.7* 19.9* 15.9* 15.2*  NEUTROABS 19.2*  --   --   --   HGB 11.7* 11.1* 11.1* 10.2*  HCT 36.8* 35.3* 35.5* 32.2*  MCV 89.5 91.0 89.6 89.9  PLT 373 370 350 347   Basic Metabolic Panel: Recent Labs  Lab 07/20/20 1839 07/21/20 0114 07/22/20 0306  NA 133* 134* 132*  K 4.0 4.1 4.4  CL 96* 96* 97*  CO2 27 27 27   GLUCOSE 180* 172* 184*  BUN 24* 23* 12  CREATININE 1.63* 1.45* 1.13  CALCIUM 9.1 8.8* 8.3*   GFR: Estimated Creatinine Clearance: 112 mL/min (by C-G formula based on SCr of 1.13 mg/dL). Liver Function Tests: Recent Labs  Lab 07/21/20 0114 07/22/20 0306  AST 20 19  ALT 16 17  ALKPHOS 70 62  BILITOT 1.0 0.7  PROT 7.6 6.9  ALBUMIN 2.4* 2.1*   No results for input(s): LIPASE, AMYLASE in the last 168 hours. No results for input(s): AMMONIA in the last 168 hours. Coagulation Profile: No results for input(s): INR, PROTIME in the last 168 hours. Cardiac Enzymes: No results for input(s): CKTOTAL, CKMB, CKMBINDEX, TROPONINI in the last 168 hours. BNP (last 3 results) No results for input(s): PROBNP in the last 8760 hours. HbA1C: No results for input(s): HGBA1C in the last 72 hours. CBG: Recent Labs  Lab 07/22/20 1758 07/22/20 2054 07/23/20 0801 07/23/20 1046 07/23/20 1241  GLUCAP 154* 173* 199* 209* 171*   Lipid Profile: No results for input(s): CHOL, HDL, LDLCALC, TRIG,  CHOLHDL, LDLDIRECT in the last 72 hours. Thyroid Function Tests: No results for input(s): TSH, T4TOTAL, FREET4, T3FREE, THYROIDAB in the last 72 hours. Anemia Panel: No results for input(s): VITAMINB12, FOLATE, FERRITIN, TIBC, IRON, RETICCTPCT in the last 72 hours. Sepsis Labs: Recent Labs  Lab 07/20/20 1839 07/20/20 2113  LATICACIDVEN 2.1* 1.4    Recent Results (from the past 240 hour(s))  SARS Coronavirus 2 by RT PCR (hospital order, performed in [facility] hospital lab) Nasopharyngeal Nasopharyngeal Swab     Status: None   Collection Time: 07/14/20  7:51 AM   Specimen: Nasopharyngeal Swab  Result Value Ref Range Status   SARS Coronavirus 2 NEGATIVE NEGATIVE Final    Comment: (NOTE) SARS-CoV-2 target nucleic acids are NOT DETECTED.  The SARS-CoV-2 RNA is generally detectable in upper and lower respiratory specimens during the acute phase of infection. The lowest concentration of SARS-CoV-2 viral copies this assay can detect is 250 copies / mL. A negative result does not preclude SARS-CoV-2 infection and should not be used as the sole basis for treatment or other patient management decisions.  A negative result may occur with improper specimen collection / handling, submission of specimen other than nasopharyngeal swab, presence of viral mutation(s) within the areas targeted by this assay, and inadequate number of viral copies (<250 copies / mL). A negative result must be combined with clinical observations, patient history, and epidemiological information.  Fact Sheet for Patients:   BoilerBrush.com.cy  Fact Sheet for Healthcare Providers: https://pope.com/  This test is not yet approved or  cleared by the [place] FDA and has been authorized for detection and/or diagnosis of SARS-CoV-2 by FDA under an Emergency Use Authorization (EUA).  This EUA will remain in effect (meaning this test can be used) for the duration of  the COVID-19 declaration under Section 564(b)(1) of the Act, 21 U.S.C. section 360bbb-3(b)(1), unless the authorization is terminated or revoked sooner.  Performed at [facility], [address] [place], [place] [address]   Resp Panel by RT-PCR (Flu A&B, Covid) Nasopharyngeal Swab     Status: None   Collection Time: 07/20/20  6:30 PM   Specimen: Nasopharyngeal Swab; Nasopharyngeal(NP) swabs in vial transport medium  Result Value Ref Range Status   SARS Coronavirus 2 by RT PCR NEGATIVE NEGATIVE Final    Comment: (NOTE) SARS-CoV-2 target nucleic acids are NOT DETECTED.  The SARS-CoV-2 RNA is generally detectable in upper respiratory specimens during the acute phase of infection. The lowest concentration of SARS-CoV-2 viral copies this assay can detect is 138 copies/mL. A negative result does not preclude SARS-Cov-2 infection and should not be used as the sole basis for treatment or other patient management decisions. A negative result may occur with  improper specimen collection/handling, submission of specimen other than nasopharyngeal swab, presence of viral mutation(s) within the areas targeted by this assay, and inadequate number of viral copies(<138 copies/mL). A negative result must be combined with clinical observations, patient history, and epidemiological information. The expected result is Negative.  Fact Sheet for Patients:  BloggerCourse.com  Fact Sheet for Healthcare Providers:  SeriousBroker.it  This test is no t yet approved or cleared by the [place] FDA and  has been authorized for detection and/or diagnosis of SARS-CoV-2 by FDA under an Emergency Use Authorization (EUA). This EUA will remain  in effect (meaning this test can be used) for the duration of the COVID-19 declaration under Section 564(b)(1) of the Act, 21 U.S.C.section 360bbb-3(b)(1), unless the authorization is terminated  or revoked  sooner.       Influenza A by PCR NEGATIVE NEGATIVE Final   Influenza B by PCR NEGATIVE NEGATIVE Final    Comment: (NOTE) The Xpert Xpress SARS-CoV-2/FLU/RSV plus assay is intended as an aid in the diagnosis of influenza from Nasopharyngeal swab specimens and should not be used as a sole basis for treatment. Nasal washings and aspirates are unacceptable for Xpert Xpress SARS-CoV-2/FLU/RSV testing.  Fact Sheet for Patients: BloggerCourse.com  Fact Sheet for Healthcare Providers: SeriousBroker.it  This test is not yet approved or cleared by the [place] FDA and has been authorized for detection and/or diagnosis of SARS-CoV-2 by FDA under an Emergency Use Authorization (EUA). This EUA will remain in effect (meaning this test can be used) for the duration of the COVID-19 declaration under Section 564(b)(1) of the Act, 21 U.S.C. section 360bbb-3(b)(1), unless the authorization is terminated or revoked.  Performed at [facility], [address] [place], [place] [address]   Blood culture (routine x 2)     Status: None (Preliminary result)   Collection Time: 07/20/20  8:42 PM   Specimen: BLOOD  Result Value Ref Range Status   Specimen Description BLOOD SITE NOT SPECIFIED  Final   Special Requests   Final    BOTTLES DRAWN AEROBIC AND ANAEROBIC Blood Culture adequate volume   Culture   Final    NO GROWTH 3 DAYS Performed at [facility], [address] [place], [place] [address]    Report Status PENDING  Incomplete  Blood culture (routine x 2)     Status: None (Preliminary result)   Collection Time: 07/20/20  9:13 PM   Specimen: BLOOD  Result Value Ref Range Status   Specimen Description BLOOD SITE NOT SPECIFIED  Final   Special Requests   Final    BOTTLES DRAWN AEROBIC AND ANAEROBIC Blood Culture adequate volume   Culture   Final    NO GROWTH 3 DAYS Performed at [facility], [address]  [place], [place] [address]    Report Status PENDING  Incomplete  Surgical pcr screen     Status: None   Collection Time: 07/21/20  4:50 PM   Specimen: Nasal Mucosa; Nasal Swab  Result Value Ref Range Status   MRSA, PCR NEGATIVE NEGATIVE Final   Staphylococcus aureus NEGATIVE NEGATIVE Final    Comment: (NOTE) The Xpert SA Assay (FDA approved for NASAL specimens in patients 53 years of age and older), is one component of a comprehensive surveillance program. It is not intended to diagnose infection nor to guide or monitor treatment. Performed at [facility], [address] [place], [place] [address]   Aerobic/Anaerobic Culture w Gram Stain (surgical/deep wound)     Status: None (Preliminary result)   Collection Time: 07/21/20  9:49 PM   Specimen: Wound; Tissue  Result Value Ref Range Status   Specimen Description WOUND  Final   Special Requests RIGHT GREAT TOE  Final   Gram Stain   Final    MODERATE WBC PRESENT, PREDOMINANTLY PMN ABUNDANT GRAM POSITIVE COCCI ABUNDANT GRAM NEGATIVE RODS RARE GRAM VARIABLE ROD RESULT CALLED TO, READ BACK BY AND VERIFIED WITH: DR Samuella CotaPRICE 07/21/20 2303 JDW Performed at [facility], [address] [place], [place] [address]    Culture   Final    FEW GRAM NEGATIVE RODS FEW STREPTOCOCCUS AGALACTIAE TESTING AGAINST S. AGALACTIAE NOT ROUTINELY PERFORMED DUE TO PREDICTABILITY OF AMP/PEN/VAN SUSCEPTIBILITY. IDENTIFICATION AND SUSCEPTIBILITIES TO FOLLOW NO ANAEROBES ISOLATED; CULTURE IN PROGRESS FOR 5 DAYS    Report Status PENDING  Incomplete         Radiology Studies: DG Foot 2 Views Right  Result Date: 07/21/2020 CLINICAL DATA:  Postop. EXAM: RIGHT FOOT - 2 VIEW COMPARISON:  Preoperative imaging. FINDINGS: Interval resection of the great toe. Overlying dressing in the operative bed. Remainder the foot is unchanged. There are vascular calcifications. IMPRESSION: Interval resection of the great toe. Electronically Signed   By: Narda RutherfordMelanie   Sanford M.D.   On: 07/21/2020 22:36        Scheduled Meds: . [MAR Hold] atorvastatin  20 mg Oral Daily  . [MAR Hold] carvedilol  6.25 mg Oral BID WC  . [MAR Hold] clopidogrel  75 mg Oral Daily  . [MAR Hold] enoxaparin (LOVENOX) injection  70 mg Subcutaneous Q24H  . fentaNYL      . [MAR Hold] insulin aspart  0-15 Units Subcutaneous TID WC  . [MAR Hold] insulin aspart  0-5 Units Subcutaneous QHS  . [MAR Hold] losartan  25 mg Oral Daily  Continuous Infusions: . [MAR Hold] ampicillin-sulbactam (UNASYN) IV Stopped (07/23/20 1517)  . lactated ringers 100 mL/hr at 07/23/20 1136  . lactated ringers 10 mL/hr at 07/23/20 1127  . [MAR Hold] vancomycin 0 mg (07/23/20 0239)     LOS: 3 days    Time spent:    Erick Blinks, MD [facility]   If 7PM-7AM, please contact night-coverage [email or web address]  07/23/2020, 1:36 PM

## 2020-07-23 NOTE — Anesthesia Procedure Notes (Signed)
Procedure Name: MAC Date/Time: 07/23/2020 11:40 AM Performed by: Griffin Dakin, CRNA Pre-anesthesia Checklist: Patient identified, Emergency Drugs available, Suction available, Patient being monitored and Timeout performed Patient Re-evaluated:Patient Re-evaluated prior to induction Oxygen Delivery Method: Simple face mask Induction Type: IV induction Placement Confirmation: positive ETCO2 and breath sounds checked- equal and bilateral Dental Injury: Teeth and Oropharynx as per pre-operative assessment

## 2020-07-24 ENCOUNTER — Encounter (HOSPITAL_COMMUNITY): Payer: Self-pay | Admitting: Podiatry

## 2020-07-24 LAB — BASIC METABOLIC PANEL
Anion gap: 9 (ref 5–15)
BUN: 9 mg/dL (ref 6–20)
CO2: 28 mmol/L (ref 22–32)
Calcium: 8.3 mg/dL — ABNORMAL LOW (ref 8.9–10.3)
Chloride: 98 mmol/L (ref 98–111)
Creatinine, Ser: 1.21 mg/dL (ref 0.61–1.24)
GFR, Estimated: >60 mL/min (ref >60)
Glucose, Bld: 192 mg/dL — ABNORMAL HIGH (ref 70–99)
Potassium: 4.6 mmol/L (ref 3.5–5.1)
Sodium: 135 mmol/L (ref 135–145)

## 2020-07-24 LAB — CBC
HCT: 32 % — ABNORMAL LOW (ref 39.0–52.0)
Hemoglobin: 9.8 g/dL — ABNORMAL LOW (ref 13.0–17.0)
MCH: 28.1 pg (ref 26.0–34.0)
MCHC: 30.6 g/dL (ref 30.0–36.0)
MCV: 91.7 fL (ref 80.0–100.0)
Platelets: 354 10*3/uL (ref 150–400)
RBC: 3.49 MIL/uL — ABNORMAL LOW (ref 4.22–5.81)
RDW: 13.4 % (ref 11.5–15.5)
WBC: 14.1 10*3/uL — ABNORMAL HIGH (ref 4.0–10.5)
nRBC: 0 % (ref 0.0–0.2)

## 2020-07-24 LAB — ACID FAST SMEAR (AFB, MYCOBACTERIA): Acid Fast Smear: NEGATIVE

## 2020-07-24 LAB — GLUCOSE, CAPILLARY
Glucose-Capillary: 151 mg/dL — ABNORMAL HIGH (ref 70–99)
Glucose-Capillary: 150 mg/dL — ABNORMAL HIGH (ref 70–99)
Glucose-Capillary: 162 mg/dL — ABNORMAL HIGH (ref 70–99)
Glucose-Capillary: 172 mg/dL — ABNORMAL HIGH (ref 70–99)

## 2020-07-24 MED ORDER — ASPIRIN EC 81 MG PO TBEC
81.0000 mg | DELAYED_RELEASE_TABLET | Freq: Every day | ORAL | Status: DC
  Administered 2020-07-24 – 2020-07-27 (×3): 81 mg via ORAL
  Filled 2020-07-24 (×4): qty 1

## 2020-07-24 MED ORDER — LOSARTAN POTASSIUM 50 MG PO TABS
50.0000 mg | ORAL_TABLET | Freq: Every day | ORAL | Status: DC
  Administered 2020-07-25 – 2020-07-26 (×2): 50 mg via ORAL
  Filled 2020-07-24 (×2): qty 1

## 2020-07-24 MED ORDER — LOSARTAN POTASSIUM 25 MG PO TABS
25.0000 mg | ORAL_TABLET | ORAL | Status: AC
  Administered 2020-07-24: 25 mg via ORAL
  Filled 2020-07-24: qty 1

## 2020-07-24 NOTE — Progress Notes (Signed)
Subjective: Austin Shaw is a 55 y.o. is seen today POD #1 s/p delayed wound closure right foot wound with partial metatarsal resection 1st metatarsal and sesamoidectomy with Dr. Samuella Cota. Pain is controlled currently. No overnight events.  Denies any systemic complaints such as fevers, chills, nausea, vomiting. No calf pain, chest pain, shortness of breath.   Objective: General: No acute distress, AAOx3  RIGHT foot: Dressing clean, dry, intact without any strike through. There is no pain to the leg and no warmth. No pain with calf compression.    Assessment and Plan:  POD #1 s/p right foot debridement, closure of wound with partial amputation 1st metataral  -Doing well this morning. Plan to change the dressing tomorrow. Continue IV antibiotics, follow cultures. WBAT in surgical shoe but discussed limit the amount of weightbearing. Elevation.   Ovid Curd, DPM

## 2020-07-24 NOTE — Progress Notes (Signed)
PROGRESS NOTE    Austin Shaw  RNH:[id number] DOB: [birth year] DOA: 07/20/2020 PCP: Joaquim Nam, MD    Brief Narrative:  55 year old male with history of diabetes, peripheral vascular disease, morbid obesity, hypertension, admitted to the hospital with right great toe osteomyelitis/gangrene.  He has been started on intravenous antibiotics.  Podiatry following and plans on likely amputation.  He recently had revascularization done of his right lower extremity.  Seen by vascular surgery who did not feel that any further revascularization was indicated at this time.   Assessment & Plan:   Principal Problem:   Gangrene of toe of right foot (HCC) Active Problems:   Diabetes mellitus type 2, uncontrolled, with complications (HCC)   OBESITY, MORBID   Essential hypertension   Cardiomyopathy (HCC)   Chronic systolic heart failure (HCC)   PVD (peripheral vascular disease) (HCC)   Osteomyelitis and gangrene right great toe -Currently on intravenous antibiotics -Podiatry following and patient underwent amputation of his toe on 4/13 -He subsequently went back to the OR on 4/15 for delayed wound closure and partial metatarsal resection and sesamoidectomy -Seen by vascular surgery and it was not felt that he needed further revascularization at this point -Blood cultures have shown no growth -Leukocytosis trending down -Intraoperative cultures show strep agalactiae and E coli -currently on rocephin -will need to discuss further antibiotic course with podiatry  Acute kidney injury -Baseline creatinine of 1.1 -Admitted with creatinine of 1.6 -Resolved with IV fluids   Diabetes, type II, uncontrolled, with hyperglycemia -Holding home doses of Metformin and Trulicity -Currently on sliding scale insulin -Blood sugars currently stable  Peripheral vascular disease -Recently had Abdominal Aortic Angiogram with Bi-Iliofemoral Runoff, Right TP trunk and peroneal artery orbital  atherectomy and balloon angioplasty And Right popliteal artery drug-coated balloon angioplasty on 4/6 by Dr. Kirke Corin -He has been on aspirin and Plavix -Seen by vascular surgery and it was not felt that further revascularization was indicated at this point  Hypertension -Continue on Coreg, losartan -Blood pressure stable -Use hydralazine as needed -increase losartan to 50mg   Obesity, class III -BMI 50.6 -Counseled on importance of diet and exercise     DVT prophylaxis: Lovenox  Code Status: Full code Family Communication: Discussed with wife at the bedside Disposition Plan: Status is: Inpatient  Remains inpatient appropriate because:Inpatient level of care appropriate due to severity of illness   Dispo: The patient is from: Home              Anticipated d/c is to: Home              Patient currently is not medically stable to d/c.   Difficult to place patient No    Consultants:   Podiatry  Vascular surgery  Procedures:   4/13:   1) Incision and drainage complex bursal infection below the deep fascia, foot             2) Amputation right great toe  Antimicrobials:   Unasyn 4/13> 4/15  Vancomycin 4/12> 4/15  Ceftriaxone 4/16>   Subjective: Pain is controlled, no new complaints  Objective: Vitals:   07/24/20 0500 07/24/20 0526 07/24/20 0900 07/24/20 1527  BP: (!) 180/88 (!) 104/58 (!) 169/69 (!) 163/85  Pulse: 90 (!) 57 91 85  Resp: 18 16 18    Temp: 99 F (37.2 C) 97.6 F (36.4 C) 99.2 F (37.3 C) 99 F (37.2 C)  TempSrc: Oral  Oral Oral  SpO2: 92% 100% 95% 94%  Weight:  Height:        Intake/Output Summary (Last 24 hours) at 07/24/2020 1630 Last data filed at 07/24/2020 1135 Gross per 24 hour  Intake --  Output 1300 ml  Net -1300 ml   Filed Weights   07/20/20 1828 07/21/20 0439  Weight: (!) 154.2 kg (!) 155.7 kg    Examination:  General exam: Alert, awake, oriented x 3 Respiratory system: Clear to auscultation. Respiratory effort  normal. Cardiovascular system:RRR. No murmurs, rubs, gallops. Gastrointestinal system: Abdomen is nondistended, soft and nontender. No organomegaly or masses felt. Normal bowel sounds heard. Central nervous system: Alert and oriented. No focal neurological deficits. Extremities: right foot is wrapped in dressing Skin: No rashes, lesions or ulcers Psychiatry: Judgement and insight appear normal. Mood & affect appropriate.    Data Reviewed: I have personally reviewed following labs and imaging studies  CBC: Recent Labs  Lab 07/20/20 1839 07/21/20 0114 07/22/20 0644 07/23/20 0024 07/24/20 0026  WBC 23.7* 19.9* 15.9* 15.2* 14.1*  NEUTROABS 19.2*  --   --   --   --   HGB 11.7* 11.1* 11.1* 10.2* 9.8*  HCT 36.8* 35.3* 35.5* 32.2* 32.0*  MCV 89.5 91.0 89.6 89.9 91.7  PLT 373 370 350 347 354   Basic Metabolic Panel: Recent Labs  Lab 07/20/20 1839 07/21/20 0114 07/22/20 0306 07/24/20 0026  NA 133* 134* 132* 135  K 4.0 4.1 4.4 4.6  CL 96* 96* 97* 98  CO2 27 27 27 28   GLUCOSE 180* 172* 184* 192*  BUN 24* 23* 12 9  CREATININE 1.63* 1.45* 1.13 1.21  CALCIUM 9.1 8.8* 8.3* 8.3*   GFR: Estimated Creatinine Clearance: 104.6 mL/min (by C-G formula based on SCr of 1.21 mg/dL). Liver Function Tests: Recent Labs  Lab 07/21/20 0114 07/22/20 0306  AST 20 19  ALT 16 17  ALKPHOS 70 62  BILITOT 1.0 0.7  PROT 7.6 6.9  ALBUMIN 2.4* 2.1*   No results for input(s): LIPASE, AMYLASE in the last 168 hours. No results for input(s): AMMONIA in the last 168 hours. Coagulation Profile: No results for input(s): INR, PROTIME in the last 168 hours. Cardiac Enzymes: No results for input(s): CKTOTAL, CKMB, CKMBINDEX, TROPONINI in the last 168 hours. BNP (last 3 results) No results for input(s): PROBNP in the last 8760 hours. HbA1C: No results for input(s): HGBA1C in the last 72 hours. CBG: Recent Labs  Lab 07/23/20 1241 07/23/20 1705 07/23/20 2046 07/24/20 0759 07/24/20 1158  GLUCAP  171* 178* 208* 151* 150*   Lipid Profile: No results for input(s): CHOL, HDL, LDLCALC, TRIG, CHOLHDL, LDLDIRECT in the last 72 hours. Thyroid Function Tests: No results for input(s): TSH, T4TOTAL, FREET4, T3FREE, THYROIDAB in the last 72 hours. Anemia Panel: No results for input(s): VITAMINB12, FOLATE, FERRITIN, TIBC, IRON, RETICCTPCT in the last 72 hours. Sepsis Labs: Recent Labs  Lab 07/20/20 1839 07/20/20 2113  LATICACIDVEN 2.1* 1.4    Recent Results (from the past 240 hour(s))  Resp Panel by RT-PCR (Flu A&B, Covid) Nasopharyngeal Swab     Status: None   Collection Time: 07/20/20  6:30 PM   Specimen: Nasopharyngeal Swab; Nasopharyngeal(NP) swabs in vial transport medium  Result Value Ref Range Status   SARS Coronavirus 2 by RT PCR NEGATIVE NEGATIVE Final    Comment: (NOTE) SARS-CoV-2 target nucleic acids are NOT DETECTED.  The SARS-CoV-2 RNA is generally detectable in upper respiratory specimens during the acute phase of infection. The lowest concentration of SARS-CoV-2 viral copies this assay can detect is 138 copies/mL.  A negative result does not preclude SARS-Cov-2 infection and should not be used as the sole basis for treatment or other patient management decisions. A negative result may occur with  improper specimen collection/handling, submission of specimen other than nasopharyngeal swab, presence of viral mutation(s) within the areas targeted by this assay, and inadequate number of viral copies(<138 copies/mL). A negative result must be combined with clinical observations, patient history, and epidemiological information. The expected result is Negative.  Fact Sheet for Patients:  BloggerCourse.com  Fact Sheet for Healthcare Providers:  SeriousBroker.it  This test is no t yet approved or cleared by the [place] FDA and  has been authorized for detection and/or diagnosis of SARS-CoV-2 by FDA under an  Emergency Use Authorization (EUA). This EUA will remain  in effect (meaning this test can be used) for the duration of the COVID-19 declaration under Section 564(b)(1) of the Act, 21 U.S.C.section 360bbb-3(b)(1), unless the authorization is terminated  or revoked sooner.       Influenza A by PCR NEGATIVE NEGATIVE Final   Influenza B by PCR NEGATIVE NEGATIVE Final    Comment: (NOTE) The Xpert Xpress SARS-CoV-2/FLU/RSV plus assay is intended as an aid in the diagnosis of influenza from Nasopharyngeal swab specimens and should not be used as a sole basis for treatment. Nasal washings and aspirates are unacceptable for Xpert Xpress SARS-CoV-2/FLU/RSV testing.  Fact Sheet for Patients: BloggerCourse.com  Fact Sheet for Healthcare Providers: SeriousBroker.it  This test is not yet approved or cleared by the [place] FDA and has been authorized for detection and/or diagnosis of SARS-CoV-2 by FDA under an Emergency Use Authorization (EUA). This EUA will remain in effect (meaning this test can be used) for the duration of the COVID-19 declaration under Section 564(b)(1) of the Act, 21 U.S.C. section 360bbb-3(b)(1), unless the authorization is terminated or revoked.  Performed at [facility], [address] [place], [place] [address]   Blood culture (routine x 2)     Status: None (Preliminary result)   Collection Time: 07/20/20  8:42 PM   Specimen: BLOOD  Result Value Ref Range Status   Specimen Description BLOOD SITE NOT SPECIFIED  Final   Special Requests   Final    BOTTLES DRAWN AEROBIC AND ANAEROBIC Blood Culture adequate volume   Culture   Final    NO GROWTH 4 DAYS Performed at [facility], [address] [place], [place] [address]    Report Status PENDING  Incomplete  Blood culture (routine x 2)     Status: None (Preliminary result)   Collection Time: 07/20/20  9:13 PM   Specimen: BLOOD  Result Value Ref  Range Status   Specimen Description BLOOD SITE NOT SPECIFIED  Final   Special Requests   Final    BOTTLES DRAWN AEROBIC AND ANAEROBIC Blood Culture adequate volume   Culture   Final    NO GROWTH 4 DAYS Performed at [facility], [address] [place], [place] [address]    Report Status PENDING  Incomplete  Surgical pcr screen     Status: None   Collection Time: 07/21/20  4:50 PM   Specimen: Nasal Mucosa; Nasal Swab  Result Value Ref Range Status   MRSA, PCR NEGATIVE NEGATIVE Final   Staphylococcus aureus NEGATIVE NEGATIVE Final    Comment: (NOTE) The Xpert SA Assay (FDA approved for NASAL specimens in patients 40 years of age and older), is one component of a comprehensive surveillance program. It is not  intended to diagnose infection nor to guide or monitor treatment. Performed at [facility], [address], [place], [place] [address]   Aerobic/Anaerobic Culture w Gram Stain (surgical/deep wound)     Status: None (Preliminary result)   Collection Time: 07/21/20  9:49 PM   Specimen: Wound; Tissue  Result Value Ref Range Status   Specimen Description WOUND  Final   Special Requests RIGHT GREAT TOE  Final   Gram Stain   Final    MODERATE WBC PRESENT, PREDOMINANTLY PMN ABUNDANT GRAM POSITIVE COCCI ABUNDANT GRAM NEGATIVE RODS RARE GRAM VARIABLE ROD RESULT CALLED TO, READ BACK BY AND VERIFIED WITH: DR Samuella Cota 07/21/20 2303 JDW Performed at [facility], [address], [place], [place] [address]    Culture   Final    FEW ESCHERICHIA COLI FEW STREPTOCOCCUS AGALACTIAE TESTING AGAINST S. AGALACTIAE NOT ROUTINELY PERFORMED DUE TO PREDICTABILITY OF AMP/PEN/VAN SUSCEPTIBILITY. NO ANAEROBES ISOLATED; CULTURE IN PROGRESS FOR 5 DAYS    Report Status PENDING  Incomplete   Organism ID, Bacteria ESCHERICHIA COLI  Final      Susceptibility   Escherichia coli - MIC*    AMPICILLIN >=32 RESISTANT Resistant     CEFAZOLIN <=4 SENSITIVE Sensitive     CEFEPIME <=0.12  SENSITIVE Sensitive     CEFTAZIDIME <=1 SENSITIVE Sensitive     CEFTRIAXONE <=0.25 SENSITIVE Sensitive     CIPROFLOXACIN <=0.25 SENSITIVE Sensitive     GENTAMICIN <=1 SENSITIVE Sensitive     IMIPENEM <=0.25 SENSITIVE Sensitive     TRIMETH/SULFA >=320 RESISTANT Resistant     AMPICILLIN/SULBACTAM 8 SENSITIVE Sensitive     PIP/TAZO <=4 SENSITIVE Sensitive     * FEW ESCHERICHIA COLI  Aerobic/Anaerobic Culture w Gram Stain (surgical/deep wound)     Status: None (Preliminary result)   Collection Time: 07/23/20 11:54 AM   Specimen: PATH Digit amputation; Tissue  Result Value Ref Range Status   Specimen Description TISSUE  Final   Special Requests BONE FIRST METATARSAL OF RIGHT FOOT SPEC A  Final   Gram Stain NO WBC SEEN NO ORGANISMS SEEN   Final   Culture   Final    RARE GRAM NEGATIVE RODS RARE GROUP B STREP(S.AGALACTIAE)ISOLATED TESTING AGAINST S. AGALACTIAE NOT ROUTINELY PERFORMED DUE TO PREDICTABILITY OF AMP/PEN/VAN SUSCEPTIBILITY. Performed at [facility], [address] [place], [place] [address]    Report Status PENDING  Incomplete  Acid Fast Smear (AFB)     Status: None   Collection Time: 07/23/20 11:54 AM   Specimen: PATH Digit amputation; Tissue  Result Value Ref Range Status   AFB Specimen Processing Concentration  Final   Acid Fast Smear Negative  Final    Comment: (NOTE) Performed At: [facility] [address] [place], [place] [id number] Jolene Schimke MD WU:[id number]    Source (AFB) TISSUE  Final    Comment: Performed at [facility], [address] [place], [place] [address]         Radiology Studies: DG Foot 2 Views Right  Result Date: 07/23/2020 CLINICAL DATA:  Status post right foot surgery EXAM: RIGHT FOOT - 2 VIEW COMPARISON:  07/21/2020 FINDINGS: Interval partial amputation of the distal half of the first metatarsal with postsurgical changes in the overlying soft tissues. Otherwise no acute fracture or dislocation. No aggressive  osseous lesion. Peripheral vascular atherosclerotic disease. IMPRESSION: 1. Interval partial amputation of the distal half of the first metatarsal with postsurgical changes in the overlying soft tissues. Electronically Signed  By: Elige KoHetal  Patel   On: 07/23/2020 15:42        Scheduled Meds: . aspirin EC  81 mg Oral Daily  . atorvastatin  20 mg Oral Daily  . carvedilol  6.25 mg Oral BID WC  . clopidogrel  75 mg Oral Daily  . enoxaparin (LOVENOX) injection  70 mg Subcutaneous Q24H  . insulin aspart  0-15 Units Subcutaneous TID WC  . insulin aspart  0-5 Units Subcutaneous QHS  . losartan  25 mg Oral NOW  . [START ON 07/25/2020] losartan  50 mg Oral Daily   Continuous Infusions: . cefTRIAXone (ROCEPHIN)  IV 2 g (07/24/20 1545)  . lactated ringers 10 mL/hr at 07/23/20 1800     LOS: 4 days    Time spent: 35mins    Erick BlinksJehanzeb Diandra Cimini, MD [facility]   If 7PM-7AM, please contact night-coverage [email or web address]  07/24/2020, 4:30 PM

## 2020-07-25 LAB — BASIC METABOLIC PANEL
Anion gap: 8 (ref 5–15)
BUN: 5 mg/dL — ABNORMAL LOW (ref 6–20)
CO2: 27 mmol/L (ref 22–32)
Calcium: 8.5 mg/dL — ABNORMAL LOW (ref 8.9–10.3)
Chloride: 101 mmol/L (ref 98–111)
Creatinine, Ser: 1.06 mg/dL (ref 0.61–1.24)
GFR, Estimated: >60 mL/min (ref >60)
Glucose, Bld: 138 mg/dL — ABNORMAL HIGH (ref 70–99)
Potassium: 3.8 mmol/L (ref 3.5–5.1)
Sodium: 136 mmol/L (ref 135–145)

## 2020-07-25 LAB — CBC
HCT: 30.4 % — ABNORMAL LOW (ref 39.0–52.0)
Hemoglobin: 9.5 g/dL — ABNORMAL LOW (ref 13.0–17.0)
MCH: 28.2 pg (ref 26.0–34.0)
MCHC: 31.3 g/dL (ref 30.0–36.0)
MCV: 90.2 fL (ref 80.0–100.0)
Platelets: 365 10*3/uL (ref 150–400)
RBC: 3.37 MIL/uL — ABNORMAL LOW (ref 4.22–5.81)
RDW: 13.3 % (ref 11.5–15.5)
WBC: 10.8 10*3/uL — ABNORMAL HIGH (ref 4.0–10.5)
nRBC: 0 % (ref 0.0–0.2)

## 2020-07-25 LAB — CULTURE, BLOOD (ROUTINE X 2)
Culture: NO GROWTH
Culture: NO GROWTH
Special Requests: ADEQUATE
Special Requests: ADEQUATE

## 2020-07-25 LAB — GLUCOSE, CAPILLARY
Glucose-Capillary: 131 mg/dL — ABNORMAL HIGH (ref 70–99)
Glucose-Capillary: 182 mg/dL — ABNORMAL HIGH (ref 70–99)
Glucose-Capillary: 172 mg/dL — ABNORMAL HIGH (ref 70–99)
Glucose-Capillary: 183 mg/dL — ABNORMAL HIGH (ref 70–99)

## 2020-07-25 MED ORDER — CARVEDILOL 12.5 MG PO TABS
12.5000 mg | ORAL_TABLET | Freq: Two times a day (BID) | ORAL | Status: DC
  Administered 2020-07-25 – 2020-07-27 (×4): 12.5 mg via ORAL
  Filled 2020-07-25 (×4): qty 1

## 2020-07-25 NOTE — Progress Notes (Signed)
PROGRESS NOTE    Austin Shaw  IHK:[id number] DOB: [birth year] DOA: 07/20/2020 PCP: Joaquim Nam, MD    Brief Narrative:  55 year old male with history of diabetes, peripheral vascular disease, morbid obesity, hypertension, admitted to the hospital with right great toe osteomyelitis/gangrene.  He has been started on intravenous antibiotics.  Podiatry following and plans on likely amputation.  He recently had revascularization done of his right lower extremity.  Seen by vascular surgery who did not feel that any further revascularization was indicated at this time.   Assessment & Plan:   Principal Problem:   Gangrene of toe of right foot (HCC) Active Problems:   Diabetes mellitus type 2, uncontrolled, with complications (HCC)   OBESITY, MORBID   Essential hypertension   Cardiomyopathy (HCC)   Chronic systolic heart failure (HCC)   PVD (peripheral vascular disease) (HCC)   Osteomyelitis and gangrene right great toe -Currently on intravenous antibiotics -Podiatry following and patient underwent amputation of his toe on 4/13 -He subsequently went back to the OR on 4/15 for delayed wound closure and partial metatarsal resection and sesamoidectomy -Seen by vascular surgery and it was not felt that he needed further revascularization at this point -Blood cultures have shown no growth -Leukocytosis trending down -Intraoperative cultures show strep agalactiae and E coli -currently on rocephin -Discussed with podiatry and they are recommending infectious disease input -We will request ID input, suspect he may need a prolonged course of IV antibiotics  Acute kidney injury -Baseline creatinine of 1.1 -Admitted with creatinine of 1.6 -Resolved with IV fluids   Diabetes, type II, uncontrolled, with hyperglycemia -Holding home doses of Metformin and Trulicity -Currently on sliding scale insulin -Blood sugars currently stable  Peripheral vascular disease -Recently had  Abdominal Aortic Angiogram with Bi-Iliofemoral Runoff, Right TP trunk and peroneal artery orbital atherectomy and balloon angioplasty And Right popliteal artery drug-coated balloon angioplasty on 4/6 by Dr. Kirke Corin -He has been on aspirin and Plavix -Seen by vascular surgery and it was not felt that further revascularization was indicated at this point  Hypertension -Continue on Coreg, losartan -Blood pressure stable -Use hydralazine as needed -increase Coreg to 12.5mg   Obesity, class III -BMI 50.6 -Counseled on importance of diet and exercise   DVT prophylaxis: Lovenox  Code Status: Full code Family Communication: Discussed with wife at the bedside Disposition Plan: Status is: Inpatient  Remains inpatient appropriate because:Inpatient level of care appropriate due to severity of illness   Dispo: The patient is from: Home              Anticipated d/c is to: Home              Patient currently is not medically stable to d/c.   Difficult to place patient No    Consultants:   Podiatry  Vascular surgery  Procedures:   4/13:   1) Incision and drainage complex bursal infection below the deep fascia, foot             2) Amputation right great toe  Antimicrobials:   Unasyn 4/13> 4/15  Vancomycin 4/12> 4/15  Ceftriaxone 4/16>   Subjective: Feels well, no new complaints  Objective: Vitals:   07/24/20 2042 07/25/20 0630 07/25/20 1427 07/25/20 1545  BP: (!) 150/82 (!) 160/77 (!) 180/86 (!) 173/70  Pulse:  81 87   Resp: 18 18 19    Temp: 98.5 F (36.9 C) 98.7 F (37.1 C) 98.2 F (36.8 C)   TempSrc: Oral Oral Oral   SpO2:  95%  95%   Weight:      Height:        Intake/Output Summary (Last 24 hours) at 07/25/2020 1646 Last data filed at 07/25/2020 1428 Gross per 24 hour  Intake 580 ml  Output 2750 ml  Net -2170 ml   Filed Weights   07/20/20 1828 07/21/20 0439  Weight: (!) 154.2 kg (!) 155.7 kg    Examination:  General exam: Alert, awake, oriented x  3 Respiratory system: Clear to auscultation. Respiratory effort normal. Cardiovascular system:RRR. No murmurs, rubs, gallops. Gastrointestinal system: Abdomen is nondistended, soft and nontender. No organomegaly or masses felt. Normal bowel sounds heard. Central nervous system: Alert and oriented. No focal neurological deficits. Extremities: right foot is wrapped in dressing Skin: No rashes, lesions or ulcers Psychiatry: Judgement and insight appear normal. Mood & affect appropriate.    Data Reviewed: I have personally reviewed following labs and imaging studies  CBC: Recent Labs  Lab 07/20/20 1839 07/21/20 0114 07/22/20 0644 07/23/20 0024 07/24/20 0026 07/25/20 0719  WBC 23.7* 19.9* 15.9* 15.2* 14.1* 10.8*  NEUTROABS 19.2*  --   --   --   --   --   HGB 11.7* 11.1* 11.1* 10.2* 9.8* 9.5*  HCT 36.8* 35.3* 35.5* 32.2* 32.0* 30.4*  MCV 89.5 91.0 89.6 89.9 91.7 90.2  PLT 373 370 350 347 354 365   Basic Metabolic Panel: Recent Labs  Lab 07/20/20 1839 07/21/20 0114 07/22/20 0306 07/24/20 0026 07/25/20 0719  NA 133* 134* 132* 135 136  K 4.0 4.1 4.4 4.6 3.8  CL 96* 96* 97* 98 101  CO2 27 27 27 28 27   GLUCOSE 180* 172* 184* 192* 138*  BUN 24* 23* 12 9 5*  CREATININE 1.63* 1.45* 1.13 1.21 1.06  CALCIUM 9.1 8.8* 8.3* 8.3* 8.5*   GFR: Estimated Creatinine Clearance: 119.4 mL/min (by C-G formula based on SCr of 1.06 mg/dL). Liver Function Tests: Recent Labs  Lab 07/21/20 0114 07/22/20 0306  AST 20 19  ALT 16 17  ALKPHOS 70 62  BILITOT 1.0 0.7  PROT 7.6 6.9  ALBUMIN 2.4* 2.1*   No results for input(s): LIPASE, AMYLASE in the last 168 hours. No results for input(s): AMMONIA in the last 168 hours. Coagulation Profile: No results for input(s): INR, PROTIME in the last 168 hours. Cardiac Enzymes: No results for input(s): CKTOTAL, CKMB, CKMBINDEX, TROPONINI in the last 168 hours. BNP (last 3 results) No results for input(s): PROBNP in the last 8760 hours. HbA1C: No  results for input(s): HGBA1C in the last 72 hours. CBG: Recent Labs  Lab 07/24/20 1158 07/24/20 1705 07/24/20 2110 07/25/20 0805 07/25/20 1130  GLUCAP 150* 162* 172* 131* 182*   Lipid Profile: No results for input(s): CHOL, HDL, LDLCALC, TRIG, CHOLHDL, LDLDIRECT in the last 72 hours. Thyroid Function Tests: No results for input(s): TSH, T4TOTAL, FREET4, T3FREE, THYROIDAB in the last 72 hours. Anemia Panel: No results for input(s): VITAMINB12, FOLATE, FERRITIN, TIBC, IRON, RETICCTPCT in the last 72 hours. Sepsis Labs: Recent Labs  Lab 07/20/20 1839 07/20/20 2113  LATICACIDVEN 2.1* 1.4    Recent Results (from the past 240 hour(s))  Resp Panel by RT-PCR (Flu A&B, Covid) Nasopharyngeal Swab     Status: None   Collection Time: 07/20/20  6:30 PM   Specimen: Nasopharyngeal Swab; Nasopharyngeal(NP) swabs in vial transport medium  Result Value Ref Range Status   SARS Coronavirus 2 by RT PCR NEGATIVE NEGATIVE Final    Comment: (NOTE) SARS-CoV-2 target nucleic acids are NOT DETECTED.  The SARS-CoV-2 RNA is generally detectable in upper respiratory specimens during the acute phase of infection. The lowest concentration of SARS-CoV-2 viral copies this assay can detect is 138 copies/mL. A negative result does not preclude SARS-Cov-2 infection and should not be used as the sole basis for treatment or other patient management decisions. A negative result may occur with  improper specimen collection/handling, submission of specimen other than nasopharyngeal swab, presence of viral mutation(s) within the areas targeted by this assay, and inadequate number of viral copies(<138 copies/mL). A negative result must be combined with clinical observations, patient history, and epidemiological information. The expected result is Negative.  Fact Sheet for Patients:  BloggerCourse.comhttps://www.fda.gov/media/152166/download  Fact Sheet for Healthcare Providers:   SeriousBroker.ithttps://www.fda.gov/media/152162/download  This test is no t yet approved or cleared by the [place] FDA and  has been authorized for detection and/or diagnosis of SARS-CoV-2 by FDA under an Emergency Use Authorization (EUA). This EUA will remain  in effect (meaning this test can be used) for the duration of the COVID-19 declaration under Section 564(b)(1) of the Act, 21 U.S.C.section 360bbb-3(b)(1), unless the authorization is terminated  or revoked sooner.       Influenza A by PCR NEGATIVE NEGATIVE Final   Influenza B by PCR NEGATIVE NEGATIVE Final    Comment: (NOTE) The Xpert Xpress SARS-CoV-2/FLU/RSV plus assay is intended as an aid in the diagnosis of influenza from Nasopharyngeal swab specimens and should not be used as a sole basis for treatment. Nasal washings and aspirates are unacceptable for Xpert Xpress SARS-CoV-2/FLU/RSV testing.  Fact Sheet for Patients: BloggerCourse.comhttps://www.fda.gov/media/152166/download  Fact Sheet for Healthcare Providers: SeriousBroker.ithttps://www.fda.gov/media/152162/download  This test is not yet approved or cleared by the [place] FDA and has been authorized for detection and/or diagnosis of SARS-CoV-2 by FDA under an Emergency Use Authorization (EUA). This EUA will remain in effect (meaning this test can be used) for the duration of the COVID-19 declaration under Section 564(b)(1) of the Act, 21 U.S.C. section 360bbb-3(b)(1), unless the authorization is terminated or revoked.  Performed at [facility], [address] [place], [place] [address]   Blood culture (routine x 2)     Status: None (Preliminary result)   Collection Time: 07/20/20  8:42 PM   Specimen: BLOOD  Result Value Ref Range Status   Specimen Description BLOOD SITE NOT SPECIFIED  Final   Special Requests   Final    BOTTLES DRAWN AEROBIC AND ANAEROBIC Blood Culture adequate volume   Culture   Final    NO GROWTH 4 DAYS Performed at [facility], [address]  [place], [place] [address]    Report Status PENDING  Incomplete  Blood culture (routine x 2)     Status: None (Preliminary result)   Collection Time: 07/20/20  9:13 PM   Specimen: BLOOD  Result Value Ref Range Status   Specimen Description BLOOD SITE NOT SPECIFIED  Final   Special Requests   Final    BOTTLES DRAWN AEROBIC AND ANAEROBIC Blood Culture adequate volume   Culture   Final    NO GROWTH 4 DAYS Performed at [facility], [address] [place], [place] [address]    Report Status PENDING  Incomplete  Surgical pcr screen     Status: None   Collection Time: 07/21/20  4:50 PM   Specimen: Nasal Mucosa; Nasal Swab  Result Value Ref Range Status   MRSA, PCR NEGATIVE NEGATIVE Final   Staphylococcus aureus NEGATIVE NEGATIVE Final  Comment: (NOTE) The Xpert SA Assay (FDA approved for NASAL specimens in patients 47 years of age and older), is one component of a comprehensive surveillance program. It is not intended to diagnose infection nor to guide or monitor treatment. Performed at [facility], [address], [place], [place] [address]   Aerobic/Anaerobic Culture w Gram Stain (surgical/deep wound)     Status: None (Preliminary result)   Collection Time: 07/21/20  9:49 PM   Specimen: Wound; Tissue  Result Value Ref Range Status   Specimen Description WOUND  Final   Special Requests RIGHT GREAT TOE  Final   Gram Stain   Final    MODERATE WBC PRESENT, PREDOMINANTLY PMN ABUNDANT GRAM POSITIVE COCCI ABUNDANT GRAM NEGATIVE RODS RARE GRAM VARIABLE ROD RESULT CALLED TO, READ BACK BY AND VERIFIED WITH: DR Samuella Cota 07/21/20 2303 JDW Performed at [facility], [address] [place], [place] [address]    Culture   Final    FEW ESCHERICHIA COLI FEW STREPTOCOCCUS AGALACTIAE TESTING AGAINST S. AGALACTIAE NOT ROUTINELY PERFORMED DUE TO PREDICTABILITY OF AMP/PEN/VAN SUSCEPTIBILITY. NO ANAEROBES ISOLATED; CULTURE IN PROGRESS FOR 5 DAYS    Report Status PENDING  Incomplete    Organism ID, Bacteria ESCHERICHIA COLI  Final      Susceptibility   Escherichia coli - MIC*    AMPICILLIN >=32 RESISTANT Resistant     CEFAZOLIN <=4 SENSITIVE Sensitive     CEFEPIME <=0.12 SENSITIVE Sensitive     CEFTAZIDIME <=1 SENSITIVE Sensitive     CEFTRIAXONE <=0.25 SENSITIVE Sensitive     CIPROFLOXACIN <=0.25 SENSITIVE Sensitive     GENTAMICIN <=1 SENSITIVE Sensitive     IMIPENEM <=0.25 SENSITIVE Sensitive     TRIMETH/SULFA >=320 RESISTANT Resistant     AMPICILLIN/SULBACTAM 8 SENSITIVE Sensitive     PIP/TAZO <=4 SENSITIVE Sensitive     * FEW ESCHERICHIA COLI  Aerobic/Anaerobic Culture w Gram Stain (surgical/deep wound)     Status: None (Preliminary result)   Collection Time: 07/23/20 11:54 AM   Specimen: PATH Digit amputation; Tissue  Result Value Ref Range Status   Specimen Description TISSUE  Final   Special Requests BONE FIRST METATARSAL OF RIGHT FOOT SPEC A  Final   Gram Stain   Final    NO WBC SEEN NO ORGANISMS SEEN Performed at [facility], [address] [place], [place] [address]    Culture   Final    RARE GRAM NEGATIVE RODS RARE GROUP B STREP(S.AGALACTIAE)ISOLATED TESTING AGAINST S. AGALACTIAE NOT ROUTINELY PERFORMED DUE TO PREDICTABILITY OF AMP/PEN/VAN SUSCEPTIBILITY. NO ANAEROBES ISOLATED; CULTURE IN PROGRESS FOR 5 DAYS    Report Status PENDING  Incomplete  Acid Fast Smear (AFB)     Status: None   Collection Time: 07/23/20 11:54 AM   Specimen: PATH Digit amputation; Tissue  Result Value Ref Range Status   AFB Specimen Processing Concentration  Final   Acid Fast Smear Negative  Final    Comment: (NOTE) Performed At: [facility] [address] [place], [place] [id number] Jolene Schimke MD OZ:[id number]    Source (AFB) TISSUE  Final    Comment: Performed at [facility], [address] [place], [place] [address]         Radiology Studies: No results found.      Scheduled Meds: . aspirin EC  81 mg Oral Daily  .  atorvastatin  20 mg Oral Daily  . carvedilol  12.5 mg Oral BID WC  . clopidogrel  75 mg  Oral Daily  . enoxaparin (LOVENOX) injection  70 mg Subcutaneous Q24H  . insulin aspart  0-15 Units Subcutaneous TID WC  . insulin aspart  0-5 Units Subcutaneous QHS  . losartan  50 mg Oral Daily   Continuous Infusions: . cefTRIAXone (ROCEPHIN)  IV 2 g (07/25/20 1549)  . lactated ringers 10 mL/hr at 07/23/20 1800     LOS: 5 days    Time spent:    Erick Blinks, MD [facility]   If 7PM-7AM, please contact night-coverage [email or web address]  07/25/2020, 4:46 PM

## 2020-07-25 NOTE — Progress Notes (Signed)
Subjective: Austin Shaw is a 55 y.o. is seen today POD #2 s/p delayed wound closure right foot wound with partial metatarsal resection 1st metatarsal and sesamoidectomy with Dr. Samuella Cota.  States he is doing better.  His pain is controlled he states he is taking pain medicine less frequently.  Denies any fevers, chills, nausea, vomiting.  No other concerns today.   Vascular surgeries previously evaluate the patient no further revascularization indicated at this time.  Objective: General: No acute distress, AAOx3  RIGHT foot: Dressing clean, dry, intact without any strike through. There is no pain to the leg and no warmth.  Upon removal of the bandage sutures intact with minimal bloody drainage identified.  Mild macerated tissue present along the incision.  There is no purulence.  There is mild surrounding edema.  There is no significant warmth.  There is no areas of fluctuation, crepitation, malodor.         Assessment and Plan:  POD #2 s/p right foot debridement, closure of wound with partial amputation 1st metataral  -Dressing was changed this morning.  I did clean incision site from the pictures above the incisions coapted and no purulence.  Appears the infection is improving.  White blood cell count is improved.  He is currently afebrile.  Initial wound cultures did grow E. coli, S. Agalactiae. Bone cultures from surgery on 4/15 are going rare gram negative rods and rare S. Agalactiae.  Given the culture of the bone is still growing bacteria would recommend infectious disease consultation to determine further antibiotic needs for discharge.  -Dressing reapplied today.  -Weight-bear as tolerated surgical shoe but limit the amount of weightbearing.  Encouraged elevation. -Vascular surgery has already seen the patient and no further intervention at this time. Will monitor healing.   Dispo: pending antibiotics and recommend ID consult to help determine antibiotic needs.   Ovid Curd, DPM

## 2020-07-25 NOTE — Plan of Care (Signed)
  Problem: Education: Goal: Knowledge of General Education information will improve Description: Including pain rating scale, medication(s)/side effects and non-pharmacologic comfort measures Outcome: Progressing   Problem: Health Behavior/Discharge Planning: Goal: Ability to manage health-related needs will improve Outcome: Progressing   Problem: Clinical Measurements: Goal: Ability to maintain clinical measurements within normal limits will improve Outcome: Progressing Goal: Respiratory complications will improve Outcome: Progressing   Problem: Activity: Goal: Risk for activity intolerance will decrease Outcome: Progressing   Problem: Nutrition: Goal: Adequate nutrition will be maintained Outcome: Progressing   Problem: Elimination: Goal: Will not experience complications related to urinary retention Outcome: Progressing   Problem: Pain Managment: Goal: General experience of comfort will improve Outcome: Progressing   Problem: Safety: Goal: Ability to remain free from injury will improve Outcome: Progressing   Problem: Skin Integrity: Goal: Risk for impaired skin integrity will decrease Outcome: Progressing   

## 2020-07-26 ENCOUNTER — Inpatient Hospital Stay: Payer: Self-pay

## 2020-07-26 DIAGNOSIS — I96 Gangrene, not elsewhere classified: Secondary | ICD-10-CM | POA: Diagnosis not present

## 2020-07-26 DIAGNOSIS — B9620 Unspecified Escherichia coli [E. coli] as the cause of diseases classified elsewhere: Secondary | ICD-10-CM

## 2020-07-26 DIAGNOSIS — E11621 Type 2 diabetes mellitus with foot ulcer: Secondary | ICD-10-CM | POA: Diagnosis not present

## 2020-07-26 DIAGNOSIS — I110 Hypertensive heart disease with heart failure: Secondary | ICD-10-CM | POA: Diagnosis not present

## 2020-07-26 DIAGNOSIS — E1152 Type 2 diabetes mellitus with diabetic peripheral angiopathy with gangrene: Secondary | ICD-10-CM

## 2020-07-26 DIAGNOSIS — I509 Heart failure, unspecified: Secondary | ICD-10-CM

## 2020-07-26 DIAGNOSIS — B951 Streptococcus, group B, as the cause of diseases classified elsewhere: Secondary | ICD-10-CM

## 2020-07-26 DIAGNOSIS — E1165 Type 2 diabetes mellitus with hyperglycemia: Secondary | ICD-10-CM

## 2020-07-26 LAB — AEROBIC/ANAEROBIC CULTURE W GRAM STAIN (SURGICAL/DEEP WOUND)

## 2020-07-26 LAB — GLUCOSE, CAPILLARY
Glucose-Capillary: 140 mg/dL — ABNORMAL HIGH (ref 70–99)
Glucose-Capillary: 150 mg/dL — ABNORMAL HIGH (ref 70–99)
Glucose-Capillary: 152 mg/dL — ABNORMAL HIGH (ref 70–99)
Glucose-Capillary: 168 mg/dL — ABNORMAL HIGH (ref 70–99)

## 2020-07-26 MED ORDER — LOSARTAN POTASSIUM 50 MG PO TABS
100.0000 mg | ORAL_TABLET | Freq: Every day | ORAL | Status: DC
  Administered 2020-07-27: 100 mg via ORAL
  Filled 2020-07-26: qty 2

## 2020-07-26 MED ORDER — SODIUM CHLORIDE 0.9% FLUSH
10.0000 mL | INTRAVENOUS | Status: DC | PRN
  Administered 2020-07-27: 10 mL

## 2020-07-26 MED ORDER — HYDRALAZINE HCL 25 MG PO TABS
25.0000 mg | ORAL_TABLET | Freq: Three times a day (TID) | ORAL | Status: DC
  Administered 2020-07-26 – 2020-07-27 (×3): 25 mg via ORAL
  Filled 2020-07-26 (×3): qty 1

## 2020-07-26 MED ORDER — CHLORHEXIDINE GLUCONATE CLOTH 2 % EX PADS
6.0000 | MEDICATED_PAD | Freq: Every day | CUTANEOUS | Status: DC
  Administered 2020-07-26 – 2020-07-27 (×2): 6 via TOPICAL

## 2020-07-26 MED ORDER — LOSARTAN POTASSIUM 50 MG PO TABS
50.0000 mg | ORAL_TABLET | ORAL | Status: AC
  Administered 2020-07-26: 50 mg via ORAL
  Filled 2020-07-26: qty 1

## 2020-07-26 MED ORDER — CEFAZOLIN SODIUM-DEXTROSE 2-4 GM/100ML-% IV SOLN
2.0000 g | Freq: Three times a day (TID) | INTRAVENOUS | Status: DC
  Administered 2020-07-26 – 2020-07-27 (×4): 2 g via INTRAVENOUS
  Filled 2020-07-26 (×6): qty 100

## 2020-07-26 MED ORDER — METRONIDAZOLE 500 MG PO TABS
500.0000 mg | ORAL_TABLET | Freq: Three times a day (TID) | ORAL | Status: DC
  Administered 2020-07-26 – 2020-07-27 (×4): 500 mg via ORAL
  Filled 2020-07-26 (×4): qty 1

## 2020-07-26 NOTE — Progress Notes (Signed)
Peripherally Inserted Central Catheter Placement  The IV Nurse has discussed with the patient and/or persons authorized to consent for the patient, the purpose of this procedure and the potential benefits and risks involved with this procedure.  The benefits include less needle sticks, lab draws from the catheter, and the patient may be discharged home with the catheter. Risks include, but not limited to, infection, bleeding, blood clot (thrombus formation), and puncture of an artery; nerve damage and irregular heartbeat and possibility to perform a PICC exchange if needed/ordered by physician.  Alternatives to this procedure were also discussed.  Bard Power PICC patient education guide, fact sheet on infection prevention and patient information card has been provided to patient /or left at bedside.    PICC Placement Documentation  PICC Single Lumen 07/26/20 Right Brachial 41 cm 0 cm (Active)  Indication for Insertion or Continuance of Line Home intravenous therapies (PICC only) 07/26/20 1410  Exposed Catheter (cm) 0 cm 07/26/20 1410  Site Assessment Clean;Dry;Intact 07/26/20 1410  Line Status Flushed;Saline locked;Blood return noted 07/26/20 1410  Dressing Type Transparent 07/26/20 1410  Dressing Status Clean;Dry;Intact 07/26/20 1410  Antimicrobial disc in place? Yes 07/26/20 1410  Safety Lock Not Applicable 07/26/20 1410  Line Care Connections checked and tightened 07/26/20 1410  Line Adjustment (NICU/IV Team Only) No 07/26/20 1410  Dressing Intervention New dressing 07/26/20 1410  Dressing Change Due 08/02/20 07/26/20 1410       Aarvi Stotts, Lajean Manes 07/26/2020, 2:12 PM

## 2020-07-26 NOTE — Progress Notes (Signed)
Subjective: Austin Shaw is a 55 y.o. is seen today POD #3 s/p delayed wound closure right foot wound with partial metatarsal resection 1st metatarsal and sesamoidectomy with Dr. Samuella Cota.  He is still taking pain medication but feels that the pain is improving.  Current denies any fevers, chills, nausea, vomiting.   Vascular surgeries previously evaluate the patient no further revascularization indicated at this time.  Infectious disease saw the patient today with antibiotic recommendations.  Objective: General: No acute distress, AAOx3  RIGHT foot: Dressing clean, dry, intact with minimal amount of bloody drainage on the bandage.  Upon removal of the bandage the incision is well coapted with sutures intact.  There is no drainage or purulence identified from incision site.  There is no significant surrounding erythema, ascending cellulitis.  No fluctuation or crepitation and there is no malodor.  No significant discomfort to palpation along the surgical site.   Assessment and Plan:  POD #3 s/p right foot debridement, closure of wound with partial amputation 1st metataral  -Dressing was changed.  Betadine was painted over the incision followed by Xeroform and a dry sterile dressing. -He has been afebrile and white blood cell count is trended downwards.Dr. Daiva Eves has recommended cefazolin 2 g IV every 8 hours plus metronidazole 500 mg 3 times a day.  PICC line was already placed today. -From podiatry standpoint he is able to be discharged once everything has been arranged for him to go home.  I will have him with close follow-up with Dr. Samuella Cota.  We will schedule him on Friday for follow-up.  Return precautions discussed.  He is agreeable to this and has no further questions or concerns today.  Ovid Curd, DPM

## 2020-07-26 NOTE — Consult Note (Signed)
Date of Admission:  07/20/2020          Reason for Consult: Diabetic foot infection with osteomyelitis    Referring Provider: Dr. Roderic Palau   Assessment:  1. 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 Incision and drainage of bursal collection deep to the fascia along with amputation of right great toe on July 21, 2020 status post repeat debridement with resection of bone inclusion of amputation of the first metatarsal with 3. Cultures from but wound continuing to grow E. coli and group B streptococcus 4. Peripheral vascular disease status post recent revascularization 5. Diabetes mellitus type 2 uncontrolled 6. Obesity 7. Chronic systolic heart failure  Plan:  1. Narrow to cefazolin 2 g IV every 8 hours + oral metronidazole 500 mg 3 times daily 2. We will try to push through 6 weeks of parenteral therapy potentially followed by oral therapy  Diagnosis: Osteomyelitis  Culture Result: B Streptococcus plus E. coli  Allergies  Allergen Reactions  . Lipitor [Atorvastatin]     intolerant    OPAT Orders Discharge antibiotics:  Cefazolin  Duration:  6 weeks  End Date:  Sep 02, 2020  [facility] Care Per Protocol:  Labs  weekly while on IV antibiotics: _x_ CBC with differential _x_ BMP w GFR/CMP _x_ CRP _x_ ESR   _x_ Please pull PIC at completion of IV antibiotics __ Please leave PIC in place until doctor has seen patient or been notified  Fax weekly labs to [phone]  Clinic Follow Up Appt:   Austin Shaw has an appointment on 08/26/2020 at 36 AM with Dr. Tommy Medal  He should arrive 15 to 30 minutes prior to his appointment  The [facility] is located in the [facility] at  [place] in [place].  [address], which is located to the left of the elevators.  Phone: [phone]  Fax: [phone]  [email or web address]     Principal Problem:   Gangrene of toe of right foot ([place]) Active Problems:   Diabetes mellitus type 2, uncontrolled, with complications (HCC)   OBESITY, MORBID   Essential hypertension   Cardiomyopathy ([place])   Chronic systolic heart failure (HCC)   PVD (peripheral vascular disease) (HCC)   Scheduled Meds: . aspirin EC  81 mg Oral Daily  . atorvastatin  20 mg Oral Daily  . carvedilol  12.5 mg Oral BID WC  . clopidogrel  75 mg Oral Daily  . enoxaparin (LOVENOX) injection  70 mg Subcutaneous Q24H  . insulin aspart  0-15 Units Subcutaneous TID WC  . insulin aspart  0-5 Units Subcutaneous QHS  . losartan  50 mg Oral Daily  . metroNIDAZOLE  500 mg Oral Q8H   Continuous Infusions: .  ceFAZolin (ANCEF) IV    . lactated ringers 10 mL/hr at 07/23/20 1800   PRN Meds:.acetaminophen, furosemide, hydrALAZINE, hydrOXYzine, morphine injection, ondansetron **OR** ondansetron (ZOFRAN) IV, oxyCODONE-acetaminophen  HPI: Austin Shaw is a 55 y.o. male with history of morbid obesity peripheral vascular disease diabetes mellitus type 2 who developed a lesion on his right great toe which was being followed by podiatry.  He also seen by vascular surgery and underwent revascularization.  In the interim he had worsening of his toe despite being treated with doxycycline and was advised to come to the emergency room for admission.  In the ER plain films showed progressive soft tissue ulceration of the first digit with  osteomyelitis the first distal phalanx.  MRI also ordered which showed Open wounds along the medial dorsal aspect the great toe with mild fasciitis and findings of osteomyelitis involving the distal phalanx of the great toe.  Patient was taken the operating room on the 13th and underwent I&D of complex bursal infection as well as amputation of the first toe.  Ultrasound that surgery yielded E. coli and group be Streptococcus.  Taken back  to operating room and underwent further resection of bone and closure of the site.  Cultures from this bone again grew E. coli and group B streptococcus.  Podiatry concerned about the risk that he might have for need for further proximal amputation and would like him to have aggressive care.  We will therefore proceed with 6 weeks of parenteral antibiotics changing the ceftriaxone to cefazolin to give more specific therapy and less risk for C. difficile colitis.  We will also add oral metronidazole to cover for anaerobic bacteria that might not have grown in the lab.  Note the patient does not drink alcohol have warned about the risk of drinking alcohol while on metronidazole.  I have scheduled him for follow-up with me in the clinic and I have ordered PICC line and O PAT orders    Review of Systems: Review of Systems  Constitutional: Positive for malaise/fatigue. Negative for chills, diaphoresis, fever and weight loss.  HENT: Negative for congestion, hearing loss, sore throat and tinnitus.   Eyes: Negative for blurred vision and double vision.  Respiratory: Negative for cough, sputum production, shortness of breath and wheezing.   Cardiovascular: Negative for chest pain, palpitations and leg swelling.  Gastrointestinal: Negative for abdominal pain, blood in stool, constipation, diarrhea, heartburn, melena, nausea and vomiting.  Genitourinary: Negative for dysuria, flank pain and hematuria.  Musculoskeletal: Positive for joint pain. Negative for back pain, falls and myalgias.  Skin: Negative for itching and rash.  Neurological: Negative for dizziness, sensory change, focal weakness, loss of consciousness, weakness and headaches.  Endo/Heme/Allergies: Does not bruise/bleed easily.  Psychiatric/Behavioral: Negative for depression, memory loss and suicidal ideas. The patient is not nervous/anxious.     Past Medical History:  Diagnosis Date  . Allergy   . Diabetes mellitus    type 2  .  Hyperlipidemia   . Hypertension   . Morbid obesity (HCC)    BMI 50  . Sleep apnea     Social History   Tobacco Use  . Smoking status: Never Smoker  . Smokeless tobacco: Never Used  Vaping Use  . Vaping Use: Never used  Substance Use Topics  . Alcohol use: No    Alcohol/week: 0.0 standard drinks  . Drug use: No    Family History  Problem Relation Age of Onset  . Diabetes Father   . Diabetes Other        fam hx 1st degree relative  . Congestive Heart Failure Other   . Lupus Daughter   . Sjogren's syndrome Daughter   . Colon cancer Neg Hx   . Prostate cancer Neg Hx   . Esophageal cancer Neg Hx   . Rectal cancer Neg Hx    Allergies  Allergen Reactions  . Lipitor [Atorvastatin]     intolerant    OBJECTIVE: Blood pressure (!) 168/74, pulse 82, temperature 98.7 F (37.1 C), temperature source Oral, resp. rate 20, height 5' 9"  (1.753 m), weight (!) 155.7 kg, SpO2 97 %.  Physical Exam Constitutional:      Appearance: He is well-developed.  HENT:     Head: Normocephalic and atraumatic.  Eyes:     Conjunctiva/sclera: Conjunctivae normal.  Cardiovascular:     Rate and Rhythm: Normal rate and regular rhythm.     Heart sounds: No murmur heard. No gallop.   Pulmonary:     Effort: Pulmonary effort is normal. No respiratory distress.     Breath sounds: Normal breath sounds. No stridor. No wheezing or rhonchi.  Abdominal:     General: Abdomen is flat. There is no distension.     Palpations: Abdomen is soft.  Musculoskeletal:        General: No tenderness. Normal range of motion.     Cervical back: Normal range of motion and neck supple.  Skin:    General: Skin is warm and dry.     Coloration: Skin is not pale.     Findings: No erythema or rash.  Neurological:     General: No focal deficit present.     Mental Status: He is alert and oriented to person, place, and time.  Psychiatric:        Mood and Affect: Mood normal.        Behavior: Behavior normal.         Thought Content: Thought content normal.        Judgment: Judgment normal.   Left foot is bandaged  Lab Results Lab Results  Component Value Date   WBC 10.8 (H) 07/25/2020   HGB 9.5 (L) 07/25/2020   HCT 30.4 (L) 07/25/2020   MCV 90.2 07/25/2020   PLT 365 07/25/2020    Lab Results  Component Value Date   CREATININE 1.06 07/25/2020   BUN 5 (L) 07/25/2020   NA 136 07/25/2020   K 3.8 07/25/2020   CL 101 07/25/2020   CO2 27 07/25/2020    Lab Results  Component Value Date   ALT 17 07/22/2020   AST 19 07/22/2020   ALKPHOS 62 07/22/2020   BILITOT 0.7 07/22/2020     Microbiology: Recent Results (from the past 240 hour(s))  Resp Panel by RT-PCR (Flu A&B, Covid) Nasopharyngeal Swab     Status: None   Collection Time: 07/20/20  6:30 PM   Specimen: Nasopharyngeal Swab; Nasopharyngeal(NP) swabs in vial transport medium  Result Value Ref Range Status   SARS Coronavirus 2 by RT PCR NEGATIVE NEGATIVE Final    Comment: (NOTE) SARS-CoV-2 target nucleic acids are NOT DETECTED.  The SARS-CoV-2 RNA is generally detectable in upper respiratory specimens during the acute phase of infection. The lowest concentration of SARS-CoV-2 viral copies this assay can detect is 138 copies/mL. A negative result does not preclude SARS-Cov-2 infection and should not be used as the sole basis for treatment or other patient management decisions. A negative result may occur with  improper specimen collection/handling, submission of specimen other than nasopharyngeal swab, presence of viral mutation(s) within the areas targeted by this assay, and inadequate number of viral copies(<138 copies/mL). A negative result must be combined with clinical observations, patient history, and epidemiological information. The expected result is Negative.  Fact Sheet for Patients:  EntrepreneurPulse.com.au  Fact Sheet for Healthcare Providers:  IncredibleEmployment.be  This test  is no t yet approved or cleared by the [place] FDA and  has been authorized for detection and/or diagnosis of SARS-CoV-2 by FDA under an Emergency Use Authorization (EUA). This EUA will remain  in effect (meaning this test can be used) for the duration of the COVID-19 declaration under Section 564(b)(1) of the Act,  21 U.S.C.section 360bbb-3(b)(1), unless the authorization is terminated  or revoked sooner.       Influenza A by PCR NEGATIVE NEGATIVE Final   Influenza B by PCR NEGATIVE NEGATIVE Final    Comment: (NOTE) The Xpert Xpress SARS-CoV-2/FLU/RSV plus assay is intended as an aid in the diagnosis of influenza from Nasopharyngeal swab specimens and should not be used as a sole basis for treatment. Nasal washings and aspirates are unacceptable for Xpert Xpress SARS-CoV-2/FLU/RSV testing.  Fact Sheet for Patients: EntrepreneurPulse.com.au  Fact Sheet for Healthcare Providers: IncredibleEmployment.be  This test is not yet approved or cleared by the [place] FDA and has been authorized for detection and/or diagnosis of SARS-CoV-2 by FDA under an Emergency Use Authorization (EUA). This EUA will remain in effect (meaning this test can be used) for the duration of the COVID-19 declaration under Section 564(b)(1) of the Act, 21 U.S.C. section 360bbb-3(b)(1), unless the authorization is terminated or revoked.  Performed at [facility], [place] [address] [place], [place] [address]   Blood culture (routine x 2)     Status: None   Collection Time: 07/20/20  8:42 PM   Specimen: BLOOD  Result Value Ref Range Status   Specimen Description BLOOD SITE NOT SPECIFIED  Final   Special Requests   Final    BOTTLES DRAWN AEROBIC AND ANAEROBIC Blood Culture adequate volume   Culture   Final    NO GROWTH 5 DAYS Performed at [facility], [address] [place], [place] [address]    Report Status 07/25/2020 FINAL  Final  Blood culture  (routine x 2)     Status: None   Collection Time: 07/20/20  9:13 PM   Specimen: BLOOD  Result Value Ref Range Status   Specimen Description BLOOD SITE NOT SPECIFIED  Final   Special Requests   Final    BOTTLES DRAWN AEROBIC AND ANAEROBIC Blood Culture adequate volume   Culture   Final    NO GROWTH 5 DAYS Performed at [facility], [place] [address] [place], [place] [address]    Report Status 07/25/2020 FINAL  Final  Surgical pcr screen     Status: None   Collection Time: 07/21/20  4:50 PM   Specimen: Nasal Mucosa; Nasal Swab  Result Value Ref Range Status   MRSA, PCR NEGATIVE NEGATIVE Final   Staphylococcus aureus NEGATIVE NEGATIVE Final    Comment: (NOTE) The Xpert SA Assay (FDA approved for NASAL specimens in patients 59 years of age and older), is one component of a comprehensive surveillance program. It is not intended to diagnose infection nor to guide or monitor treatment. Performed at [facility], [place] [address] [place], [place] [address]   Fungus Culture With Stain     Status: None (Preliminary result)   Collection Time: 07/21/20  9:49 PM   Specimen: Wound; Tissue  Result Value Ref Range Status   Fungus Stain Final report  Final    Comment: (NOTE) Performed At: [facility] [place], [place] [id number] Rush Farmer MD YI:[id number]    Fungus (Mycology) Culture PENDING  Incomplete   Fungal Source TOE  Final    Comment: RIGHT GREAT Performed at [facility], [place] [address], [place],  [address]   Aerobic/Anaerobic Culture w Gram Stain (surgical/deep wound)     Status: None (Preliminary result)   Collection Time: 07/21/20  9:49 PM   Specimen: Wound; Tissue  Result Value Ref Range Status  Specimen Description WOUND  Final   Special Requests RIGHT GREAT TOE  Final   Gram Stain   Final    MODERATE WBC PRESENT, PREDOMINANTLY PMN ABUNDANT GRAM POSITIVE COCCI ABUNDANT GRAM NEGATIVE RODS RARE GRAM VARIABLE ROD RESULT  CALLED TO, READ BACK BY AND VERIFIED WITH: DR March Rummage 07/21/20 2303 JDW Performed at [facility], [address], [place], [place] [address]    Culture   Final    FEW ESCHERICHIA COLI FEW STREPTOCOCCUS AGALACTIAE TESTING AGAINST S. AGALACTIAE NOT ROUTINELY PERFORMED DUE TO PREDICTABILITY OF AMP/PEN/VAN SUSCEPTIBILITY. NO ANAEROBES ISOLATED; CULTURE IN PROGRESS FOR 5 DAYS    Report Status PENDING  Incomplete   Organism ID, Bacteria ESCHERICHIA COLI  Final      Susceptibility   Escherichia coli - MIC*    AMPICILLIN >=32 RESISTANT Resistant     CEFAZOLIN <=4 SENSITIVE Sensitive     CEFEPIME <=0.12 SENSITIVE Sensitive     CEFTAZIDIME <=1 SENSITIVE Sensitive     CEFTRIAXONE <=0.25 SENSITIVE Sensitive     CIPROFLOXACIN <=0.25 SENSITIVE Sensitive     GENTAMICIN <=1 SENSITIVE Sensitive     IMIPENEM <=0.25 SENSITIVE Sensitive     TRIMETH/SULFA >=320 RESISTANT Resistant     AMPICILLIN/SULBACTAM 8 SENSITIVE Sensitive     PIP/TAZO <=4 SENSITIVE Sensitive     * FEW ESCHERICHIA COLI  Fungus Culture Result     Status: None   Collection Time: 07/21/20  9:49 PM  Result Value Ref Range Status   Result 1 Comment  Final    Comment: (NOTE) KOH/Calcofluor preparation:  no fungus observed. Performed At: [facility] [place], [place] [id number] Rush Farmer MD YY:[id number]   Aerobic/Anaerobic Culture w Gram Stain (surgical/deep wound)     Status: None (Preliminary result)   Collection Time: 07/23/20 11:54 AM   Specimen: PATH Digit amputation; Tissue  Result Value Ref Range Status   Specimen Description TISSUE  Final   Special Requests BONE FIRST METATARSAL OF RIGHT FOOT SPEC A  Final   Gram Stain   Final    NO WBC SEEN NO ORGANISMS SEEN Performed at [facility], [address] [place], [place] [address]    Culture   Final    RARE ESCHERICHIA COLI RARE GROUP B STREP(S.AGALACTIAE)ISOLATED TESTING AGAINST S. AGALACTIAE NOT ROUTINELY PERFORMED DUE TO  PREDICTABILITY OF AMP/PEN/VAN SUSCEPTIBILITY. NO ANAEROBES ISOLATED; CULTURE IN PROGRESS FOR 5 DAYS    Report Status PENDING  Incomplete   Organism ID, Bacteria ESCHERICHIA COLI  Final      Susceptibility   Escherichia coli - MIC*    AMPICILLIN >=32 RESISTANT Resistant     CEFAZOLIN <=4 SENSITIVE Sensitive     CEFEPIME <=0.12 SENSITIVE Sensitive     CEFTAZIDIME <=1 SENSITIVE Sensitive     CEFTRIAXONE <=0.25 SENSITIVE Sensitive     CIPROFLOXACIN <=0.25 SENSITIVE Sensitive     GENTAMICIN <=1 SENSITIVE Sensitive     IMIPENEM <=0.25 SENSITIVE Sensitive     TRIMETH/SULFA >=320 RESISTANT Resistant     AMPICILLIN/SULBACTAM 4 SENSITIVE Sensitive     PIP/TAZO <=4 SENSITIVE Sensitive     * RARE ESCHERICHIA COLI  Acid Fast Smear (AFB)     Status: None   Collection Time: 07/23/20 11:54 AM   Specimen: PATH Digit amputation; Tissue  Result Value Ref Range Status   AFB Specimen Processing Concentration  Final   Acid Fast Smear Negative  Final    Comment: (NOTE) Performed At: [facility] [place],  [place] [id number] Rush Farmer MD NP:[id number]    Source (AFB) TISSUE  Final    Comment: Performed at [facility], [place] [address] [place], [place] [address]    Alcide Evener, [place] [facility] [phone] pager  07/26/2020, 11:45 AM

## 2020-07-26 NOTE — Plan of Care (Signed)
  Problem: Education: Goal: Knowledge of General Education information will improve Description: Including pain rating scale, medication(s)/side effects and non-pharmacologic comfort measures Outcome: Progressing   Problem: Health Behavior/Discharge Planning: Goal: Ability to manage health-related needs will improve Outcome: Progressing   Problem: Clinical Measurements: Goal: Ability to maintain clinical measurements within normal limits will improve Outcome: Progressing Goal: Will remain free from infection Outcome: Progressing Goal: Respiratory complications will improve Outcome: Progressing   Problem: Activity: Goal: Risk for activity intolerance will decrease Outcome: Progressing   Problem: Nutrition: Goal: Adequate nutrition will be maintained Outcome: Progressing   Problem: Coping: Goal: Level of anxiety will decrease Outcome: Progressing   Problem: Elimination: Goal: Will not experience complications related to bowel motility Outcome: Progressing Goal: Will not experience complications related to urinary retention Outcome: Progressing   Problem: Pain Managment: Goal: General experience of comfort will improve Outcome: Progressing   Problem: Safety: Goal: Ability to remain free from injury will improve Outcome: Progressing   Problem: Skin Integrity: Goal: Risk for impaired skin integrity will decrease Outcome: Progressing   Problem: Education: Goal: Knowledge of the prescribed therapeutic regimen will improve Outcome: Progressing Goal: Understanding of discharge needs will improve Outcome: Progressing   Problem: Activity: Goal: Ability to perform//tolerate increased activity and mobilize with assistive devices will improve Outcome: Progressing   Problem: Clinical Measurements: Goal: Postoperative complications will be avoided or minimized Outcome: Progressing   Problem: Self-Care: Goal: Ability to meet self-care needs will improve Outcome:  Progressing   Problem: Pain Management: Goal: Pain level will decrease with appropriate interventions Outcome: Progressing

## 2020-07-26 NOTE — Progress Notes (Signed)
Pharmacy Antibiotic Note  Austin Shaw is a 55 y.o. male admitted on 07/20/2020 with right foot osteomyelitis s/p I and D and partial amputation on 4/15.   Pharmacy has been consulted for cefazolin dosing for E coli and group B strep in surgical cultures. Renal function is stable with Scr- 1.06 and CrCl > 100 ml/min.   Plan: Cefazolin 2 gm IV Q 8 hours OPAT complete Follow renal function and cultures   Height: 5\' 9"  (175.3 cm) Weight: (!) 155.7 kg (343 lb 4.1 oz) IBW/kg (Calculated) : 70.7  Temp (24hrs), Avg:98.8 F (37.1 C), Min:98.2 F (36.8 C), Max:99.5 F (37.5 C)  Recent Labs  Lab 07/20/20 1839 07/20/20 2113 07/21/20 0114 07/22/20 0306 07/22/20 0644 07/23/20 0024 07/24/20 0026 07/25/20 0719  WBC 23.7*  --  19.9*  --  15.9* 15.2* 14.1* 10.8*  CREATININE 1.63*  --  1.45* 1.13  --   --  1.21 1.06  LATICACIDVEN 2.1* 1.4  --   --   --   --   --   --     Estimated Creatinine Clearance: 119.4 mL/min (by C-G formula based on SCr of 1.06 mg/dL).    Allergies  Allergen Reactions  . Lipitor [Atorvastatin]     intolerant      Thank you for allowing pharmacy to be a part of this patient's care.  07/27/20, PharmD, BCPS, BCIDP Infectious Diseases Clinical Pharmacist Phone: [phone] 07/26/2020 10:49 AM

## 2020-07-26 NOTE — TOC Initial Note (Signed)
Transition of Care [facility]) - Initial/Assessment Note    Patient Details  Name: Austin Shaw MRN: [id number] Date of Birth: April 14, 1965  Transition of Care [facility]) CM/SW Contact:    Epifanio Lesches, RN Phone Number: 07/26/2020, 5:18 PM  Clinical Narrative:                 Admitted with Right big toe infection /Osteomyelitis   , hx of diabetes, hyperlipidemia, peripheral vascular disease, diabetic neuropathy and morbid obesity. From home with wife. States pta independent with ADL's.       - s/p Incision and drainage complex bursal infection, R foot;             Amputation right great toe 4/13  Per ID: Pt will need 6 weeks IV ABX therapy  NCM spoke with pt and wife regarding TOC needs. Pt agreeable to home health services. Choice provided. Pt without preference. Referral made with [facility] ( IV ABX therapy) and [facility] ( RN) ACCEPTANCE PENDING .  Home infusion teaching is needed prior to d/c by Pam with [facility]  TOC team will continue to monitor and assist with TOC needs....  Expected Discharge Plan: Home w Home Health Services Barriers to Discharge: Continued Medical Work up   Patient Goals and CMS Choice     Choice offered to / list presented to : Patient  Expected Discharge Plan and Services Expected Discharge Plan: Home w Home Health Services   Discharge Planning Services: CM Consult   Living arrangements for the past 2 months: Single Family Home                 DME Arranged: Other see comment (IV infusion therapy/ [facility]) DME Agency: Other - Comment ([facility]) Date DME Agency Contacted: 07/26/20 Time DME Agency Contacted: [phone] Representative spoke with at DME Agency: Pam HH Arranged: RN HH Agency: Other - See comment Date HH Agency Contacted: 07/26/20 [facility]) Time [facility] Agency Contacted: [phone] Representative spoke with at [facility] Agency: Pam  Prior Living Arrangements/Services Living  arrangements for the past 2 months: Single Family Home Lives with:: Spouse Patient language and need for interpreter reviewed:: Yes Do you feel safe going back to the place where you live?: Yes      Need for Family Participation in Patient Care: Yes (Comment) Care giver support system in place?: Yes (comment)   Criminal Activity/Legal Involvement Pertinent to Current Situation/Hospitalization: No - Comment as needed  Activities of Daily Living      Permission Sought/Granted   Permission granted to share information with : Yes, Verbal Permission Granted  Share Information with NAME: Cotina Whichard (Spouse)[phone]           Emotional Assessment Appearance:: Appears stated age Attitude/Demeanor/Rapport: Gracious Affect (typically observed): Accepting Orientation: : Oriented to Self,Oriented to Place,Oriented to  Time,Oriented to Situation Alcohol / Substance Use: Not Applicable Psych Involvement: No (comment)  Admission diagnosis:  Foot infection [L08.9] Gangrene of toe of right foot [facility]) [I96] Patient Active Problem List   Diagnosis Date Noted  . PVD (peripheral vascular disease) (HCC) 07/20/2020  . Gangrene of toe of right foot (HCC) 07/20/2020  . Right lateral abdominal pain 03/23/2019  . Chronic systolic heart failure (HCC) 05/12/2018  . Advance care planning 11/20/2017  . Decreased cardiac ejection fraction 08/11/2014  . Cardiomyopathy (HCC) 08/11/2014  . SOB (shortness of breath) 08/06/2014  . Cough 06/11/2014  . Diabetic retinopathy (HCC) 03/22/2014  . Impotence 12/21/2010  .  Routine general medical examination at a health care facility 10/16/2010  . OTHER MALAISE AND FATIGUE 11/11/2008  . OBESITY, MORBID 08/14/2008  . Diabetes mellitus type 2, uncontrolled, with complications (HCC) 12/26/2006  . HYPERLIPIDEMIA 12/26/2006  . Essential hypertension 12/26/2006   PCP:  Joaquim Nam, MD Pharmacy:   [facility] [phone], [place] - [address] [place] [address] Phone: [phone] Fax: [phone]     Social Determinants of Health (SDOH) Interventions    Readmission Risk Interventions No flowsheet data found.

## 2020-07-26 NOTE — Progress Notes (Signed)
PHARMACY CONSULT NOTE FOR:  OUTPATIENT  PARENTERAL ANTIBIOTIC THERAPY (OPAT)  Indication: Osteomyelitis Regimen: Cefazolin 2 gm IV Q 8 hours  End date: 09/02/20  IV antibiotic discharge orders are pended. To discharging provider:  please sign these orders via discharge navigator,  Select New Orders & click on the button choice - Manage This Unsigned Work.     Thank you for allowing pharmacy to be a part of this patient's care.  Sharin Mons, PharmD, BCPS, BCIDP Infectious Diseases Clinical Pharmacist Phone: [phone] 07/26/2020, 10:33 AM

## 2020-07-26 NOTE — Plan of Care (Signed)
  Problem: Education: Goal: Knowledge of General Education information will improve Description: Including pain rating scale, medication(s)/side effects and non-pharmacologic comfort measures Outcome: Progressing   Problem: Health Behavior/Discharge Planning: Goal: Ability to manage health-related needs will improve Outcome: Progressing   Problem: Clinical Measurements: Goal: Ability to maintain clinical measurements within normal limits will improve Outcome: Progressing Goal: Will remain free from infection Outcome: Progressing Goal: Respiratory complications will improve Outcome: Progressing   Problem: Activity: Goal: Risk for activity intolerance will decrease Outcome: Progressing   Problem: Nutrition: Goal: Adequate nutrition will be maintained Outcome: Progressing   Problem: Coping: Goal: Level of anxiety will decrease Outcome: Progressing   Problem: Elimination: Goal: Will not experience complications related to bowel motility Outcome: Progressing Goal: Will not experience complications related to urinary retention Outcome: Progressing   Problem: Pain Managment: Goal: General experience of comfort will improve Outcome: Progressing   Problem: Safety: Goal: Ability to remain free from injury will improve Outcome: Progressing   Problem: Skin Integrity: Goal: Risk for impaired skin integrity will decrease Outcome: Progressing   

## 2020-07-26 NOTE — Progress Notes (Signed)
PROGRESS NOTE    Austin Shaw  OFB:[id number] DOB: 1965/10/13 DOA: 07/20/2020 PCP: Joaquim Nam, MD    Brief Narrative:  55 year old male with history of diabetes, peripheral vascular disease, morbid obesity, hypertension, admitted to the hospital with right great toe osteomyelitis/gangrene.  He has been started on intravenous antibiotics.  Podiatry following and plans on likely amputation.  He recently had revascularization done of his right lower extremity.  Seen by vascular surgery who did not feel that any further revascularization was indicated at this time.   Assessment & Plan:   Principal Problem:   Gangrene of toe of right foot (HCC) Active Problems:   Diabetes mellitus type 2, uncontrolled, with complications (HCC)   OBESITY, MORBID   Essential hypertension   Cardiomyopathy (HCC)   Chronic systolic heart failure (HCC)   PVD (peripheral vascular disease) (HCC)   Osteomyelitis and gangrene right great toe -Currently on intravenous antibiotics -Podiatry following and patient underwent amputation of his toe on 4/13 -He subsequently went back to the OR on 4/15 for delayed wound closure and partial metatarsal resection and sesamoidectomy -Seen by vascular surgery and it was not felt that he needed further revascularization at this point -Blood cultures have shown no growth -Leukocytosis trending down -Intraoperative cultures show strep agalactiae and E coli -currently on rocephin -appreciate ID input -plan is for 6 weeks of ancef and metronidazole -will arrange home health -PT/OT eval -hopefully home in next 24 hours if ok with podiatry  Acute kidney injury -Baseline creatinine of 1.1 -Admitted with creatinine of 1.6 -Resolved with IV fluids   Diabetes, type II, uncontrolled, with hyperglycemia -Holding home doses of Metformin and Trulicity -Currently on sliding scale insulin -Blood sugars currently stable  Peripheral vascular disease -Recently had  Abdominal Aortic Angiogram with Bi-Iliofemoral Runoff, Right TP trunk and peroneal artery orbital atherectomy and balloon angioplasty And Right popliteal artery drug-coated balloon angioplasty on 4/6 by Dr. Kirke Corin -He has been on aspirin and Plavix -Seen by vascular surgery and it was not felt that further revascularization was indicated at this point  Hypertension -Continue on Coreg, losartan -Blood pressure stable -Use hydralazine as needed -increase losartan to 100mg  -add hydralazine 25mg  tid  Obesity, class III -BMI 50.6 -Counseled on importance of diet and exercise   DVT prophylaxis: Lovenox  Code Status: Full code Family Communication: Discussed with wife at the bedside Disposition Plan: Status is: Inpatient  Remains inpatient appropriate because:Inpatient level of care appropriate due to severity of illness   Dispo: The patient is from: Home              Anticipated d/c is to: Home              Patient currently is not medically stable to d/c.   Difficult to place patient No    Consultants:   Podiatry  Vascular surgery  Infectious Disease  Procedures:   4/13:   1) Incision and drainage complex bursal infection below the deep fascia, foot             2) Amputation right great toe  Antimicrobials:   Unasyn 4/13> 4/15  Vancomycin 4/12> 4/15  Ceftriaxone 4/16>4/18  Ancef 4/18>  Metronidazole 4/18>   Subjective: Recently had picc line put in. Denies any complaints. Reports pain is controlled. Still receiving IV morphine at times.  Objective: Vitals:   07/26/20 0632 07/26/20 1201 07/26/20 1450 07/26/20 1512  BP: (!) 168/74 (!) 178/83 (!) 198/100 (!) 178/90  Pulse: 82 74  Resp:  20    Temp:  98.6 F (37 C)    TempSrc:      SpO2: 97% 94%    Weight:      Height:        Intake/Output Summary (Last 24 hours) at 07/26/2020 1539 Last data filed at 07/26/2020 1047 Gross per 24 hour  Intake 870 ml  Output 4150 ml  Net -3280 ml   Filed Weights    07/20/20 1828 07/21/20 0439  Weight: (!) 154.2 kg (!) 155.7 kg    Examination:  General exam: Alert, awake, oriented x 3 Respiratory system: Clear to auscultation. Respiratory effort normal. Cardiovascular system:RRR. No murmurs, rubs, gallops. Gastrointestinal system: Abdomen is nondistended, soft and nontender. No organomegaly or masses felt. Normal bowel sounds heard. Central nervous system: Alert and oriented. No focal neurological deficits. Extremities: right foot is wrapped in dressing Skin: No rashes, lesions or ulcers Psychiatry: Judgement and insight appear normal. Mood & affect appropriate.     Data Reviewed: I have personally reviewed following labs and imaging studies  CBC: Recent Labs  Lab 07/20/20 1839 07/21/20 0114 07/22/20 0644 07/23/20 0024 07/24/20 0026 07/25/20 0719  WBC 23.7* 19.9* 15.9* 15.2* 14.1* 10.8*  NEUTROABS 19.2*  --   --   --   --   --   HGB 11.7* 11.1* 11.1* 10.2* 9.8* 9.5*  HCT 36.8* 35.3* 35.5* 32.2* 32.0* 30.4*  MCV 89.5 91.0 89.6 89.9 91.7 90.2  PLT 373 370 350 347 354 365   Basic Metabolic Panel: Recent Labs  Lab 07/20/20 1839 07/21/20 0114 07/22/20 0306 07/24/20 0026 07/25/20 0719  NA 133* 134* 132* 135 136  K 4.0 4.1 4.4 4.6 3.8  CL 96* 96* 97* 98 101  CO2 27 27 27 28 27   GLUCOSE 180* 172* 184* 192* 138*  BUN 24* 23* 12 9 5*  CREATININE 1.63* 1.45* 1.13 1.21 1.06  CALCIUM 9.1 8.8* 8.3* 8.3* 8.5*   GFR: Estimated Creatinine Clearance: 119.4 mL/min (by C-G formula based on SCr of 1.06 mg/dL). Liver Function Tests: Recent Labs  Lab 07/21/20 0114 07/22/20 0306  AST 20 19  ALT 16 17  ALKPHOS 70 62  BILITOT 1.0 0.7  PROT 7.6 6.9  ALBUMIN 2.4* 2.1*   No results for input(s): LIPASE, AMYLASE in the last 168 hours. No results for input(s): AMMONIA in the last 168 hours. Coagulation Profile: No results for input(s): INR, PROTIME in the last 168 hours. Cardiac Enzymes: No results for input(s): CKTOTAL, CKMB, CKMBINDEX,  TROPONINI in the last 168 hours. BNP (last 3 results) No results for input(s): PROBNP in the last 8760 hours. HbA1C: No results for input(s): HGBA1C in the last 72 hours. CBG: Recent Labs  Lab 07/25/20 1130 07/25/20 1728 07/25/20 2224 07/26/20 0807 07/26/20 1159  GLUCAP 182* 172* 183* 140* 150*   Lipid Profile: No results for input(s): CHOL, HDL, LDLCALC, TRIG, CHOLHDL, LDLDIRECT in the last 72 hours. Thyroid Function Tests: No results for input(s): TSH, T4TOTAL, FREET4, T3FREE, THYROIDAB in the last 72 hours. Anemia Panel: No results for input(s): VITAMINB12, FOLATE, FERRITIN, TIBC, IRON, RETICCTPCT in the last 72 hours. Sepsis Labs: Recent Labs  Lab 07/20/20 1839 07/20/20 2113  LATICACIDVEN 2.1* 1.4    Recent Results (from the past 240 hour(s))  Resp Panel by RT-PCR (Flu A&B, Covid) Nasopharyngeal Swab     Status: None   Collection Time: 07/20/20  6:30 PM   Specimen: Nasopharyngeal Swab; Nasopharyngeal(NP) swabs in vial transport medium  Result Value Ref Range  Status   SARS Coronavirus 2 by RT PCR NEGATIVE NEGATIVE Final    Comment: (NOTE) SARS-CoV-2 target nucleic acids are NOT DETECTED.  The SARS-CoV-2 RNA is generally detectable in upper respiratory specimens during the acute phase of infection. The lowest concentration of SARS-CoV-2 viral copies this assay can detect is 138 copies/mL. A negative result does not preclude SARS-Cov-2 infection and should not be used as the sole basis for treatment or other patient management decisions. A negative result may occur with  improper specimen collection/handling, submission of specimen other than nasopharyngeal swab, presence of viral mutation(s) within the areas targeted by this assay, and inadequate number of viral copies(<138 copies/mL). A negative result must be combined with clinical observations, patient history, and epidemiological information. The expected result is Negative.  Fact Sheet for Patients:   BloggerCourse.com  Fact Sheet for Healthcare Providers:  SeriousBroker.it  This test is no t yet approved or cleared by the [place] FDA and  has been authorized for detection and/or diagnosis of SARS-CoV-2 by FDA under an Emergency Use Authorization (EUA). This EUA will remain  in effect (meaning this test can be used) for the duration of the COVID-19 declaration under Section 564(b)(1) of the Act, 21 U.S.C.section 360bbb-3(b)(1), unless the authorization is terminated  or revoked sooner.       Influenza A by PCR NEGATIVE NEGATIVE Final   Influenza B by PCR NEGATIVE NEGATIVE Final    Comment: (NOTE) The Xpert Xpress SARS-CoV-2/FLU/RSV plus assay is intended as an aid in the diagnosis of influenza from Nasopharyngeal swab specimens and should not be used as a sole basis for treatment. Nasal washings and aspirates are unacceptable for Xpert Xpress SARS-CoV-2/FLU/RSV testing.  Fact Sheet for Patients: BloggerCourse.com  Fact Sheet for Healthcare Providers: SeriousBroker.it  This test is not yet approved or cleared by the [place] FDA and has been authorized for detection and/or diagnosis of SARS-CoV-2 by FDA under an Emergency Use Authorization (EUA). This EUA will remain in effect (meaning this test can be used) for the duration of the COVID-19 declaration under Section 564(b)(1) of the Act, 21 U.S.C. section 360bbb-3(b)(1), unless the authorization is terminated or revoked.  Performed at [facility], [address] [place], [place] [address]   Blood culture (routine x 2)     Status: None   Collection Time: 07/20/20  8:42 PM   Specimen: BLOOD  Result Value Ref Range Status   Specimen Description BLOOD SITE NOT SPECIFIED  Final   Special Requests   Final    BOTTLES DRAWN AEROBIC AND ANAEROBIC Blood Culture adequate volume   Culture   Final    NO GROWTH 5  DAYS Performed at [facility], [address], [place], [place] [address]    Report Status 07/25/2020 FINAL  Final  Blood culture (routine x 2)     Status: None   Collection Time: 07/20/20  9:13 PM   Specimen: BLOOD  Result Value Ref Range Status   Specimen Description BLOOD SITE NOT SPECIFIED  Final   Special Requests   Final    BOTTLES DRAWN AEROBIC AND ANAEROBIC Blood Culture adequate volume   Culture   Final    NO GROWTH 5 DAYS Performed at [facility], [address], [place], [place] [address]    Report Status 07/25/2020 FINAL  Final  Surgical pcr screen     Status: None   Collection Time: 07/21/20  4:50 PM   Specimen: Nasal Mucosa; Nasal Swab  Result Value Ref Range Status   MRSA, PCR NEGATIVE NEGATIVE Final   Staphylococcus aureus NEGATIVE NEGATIVE Final    Comment: (NOTE) The Xpert SA Assay (FDA approved for NASAL specimens in patients 53 years of age and older), is one component of a comprehensive surveillance program. It is not intended to diagnose infection nor to guide or monitor treatment. Performed at [facility], [address] [place], [place] [address]   Fungus Culture With Stain     Status: None (Preliminary result)   Collection Time: 07/21/20  9:49 PM   Specimen: Wound; Tissue  Result Value Ref Range Status   Fungus Stain Final report  Final    Comment: (NOTE) Performed At: [facility] [place] [address] [place], [place] [id number] Jolene SchimkeNagendra Sanjai MD [id number]:[phone]    Fungus (Mycology) Culture PENDING  Incomplete   Fungal Source TOE  Final    Comment: RIGHT GREAT Performed at [facility], [address] [place], [place] [address]   Aerobic/Anaerobic Culture w Gram Stain (surgical/deep wound)     Status: None   Collection Time: 07/21/20  9:49 PM   Specimen: Wound; Tissue  Result Value Ref Range Status   Specimen Description WOUND  Final   Special Requests RIGHT GREAT TOE  Final   Gram Stain   Final    MODERATE WBC  PRESENT, PREDOMINANTLY PMN ABUNDANT GRAM POSITIVE COCCI ABUNDANT GRAM NEGATIVE RODS RARE GRAM VARIABLE ROD RESULT CALLED TO, READ BACK BY AND VERIFIED WITH: DR Samuella CotaPRICE 07/21/20 2303 JDW    Culture   Final    FEW ESCHERICHIA COLI FEW STREPTOCOCCUS AGALACTIAE TESTING AGAINST S. AGALACTIAE NOT ROUTINELY PERFORMED DUE TO PREDICTABILITY OF AMP/PEN/VAN SUSCEPTIBILITY. NO ANAEROBES ISOLATED Performed at [facility], [address] [place], [place] [address]    Report Status 07/26/2020 FINAL  Final   Organism ID, Bacteria ESCHERICHIA COLI  Final      Susceptibility   Escherichia coli - MIC*    AMPICILLIN >=32 RESISTANT Resistant     CEFAZOLIN <=4 SENSITIVE Sensitive     CEFEPIME <=0.12 SENSITIVE Sensitive     CEFTAZIDIME <=1 SENSITIVE Sensitive     CEFTRIAXONE <=0.25 SENSITIVE Sensitive     CIPROFLOXACIN <=0.25 SENSITIVE Sensitive     GENTAMICIN <=1 SENSITIVE Sensitive     IMIPENEM <=0.25 SENSITIVE Sensitive     TRIMETH/SULFA >=320 RESISTANT Resistant     AMPICILLIN/SULBACTAM 8 SENSITIVE Sensitive     PIP/TAZO <=4 SENSITIVE Sensitive     * FEW ESCHERICHIA COLI  Fungus Culture Result     Status: None   Collection Time: 07/21/20  9:49 PM  Result Value Ref Range Status   Result 1 Comment  Final    Comment: (NOTE) KOH/Calcofluor preparation:  no fungus observed. Performed At: [facility] [place] [address] [place], [place] [id number] Jolene SchimkeNagendra Sanjai MD ZD:[id number]:[phone]   Aerobic/Anaerobic Culture w Gram Stain (surgical/deep wound)     Status: None (Preliminary result)   Collection Time: 07/23/20 11:54 AM   Specimen: PATH Digit amputation; Tissue  Result Value Ref Range Status   Specimen Description TISSUE  Final   Special Requests BONE FIRST METATARSAL OF RIGHT FOOT SPEC A  Final   Gram Stain   Final    NO WBC SEEN NO ORGANISMS SEEN Performed at [facility], [address] [place], [place] [address]    Culture   Final    RARE ESCHERICHIA COLI RARE GROUP B  STREP(S.AGALACTIAE)ISOLATED TESTING AGAINST S. AGALACTIAE NOT  ROUTINELY PERFORMED DUE TO PREDICTABILITY OF AMP/PEN/VAN SUSCEPTIBILITY. NO ANAEROBES ISOLATED; CULTURE IN PROGRESS FOR 5 DAYS    Report Status PENDING  Incomplete   Organism ID, Bacteria ESCHERICHIA COLI  Final      Susceptibility   Escherichia coli - MIC*    AMPICILLIN >=32 RESISTANT Resistant     CEFAZOLIN <=4 SENSITIVE Sensitive     CEFEPIME <=0.12 SENSITIVE Sensitive     CEFTAZIDIME <=1 SENSITIVE Sensitive     CEFTRIAXONE <=0.25 SENSITIVE Sensitive     CIPROFLOXACIN <=0.25 SENSITIVE Sensitive     GENTAMICIN <=1 SENSITIVE Sensitive     IMIPENEM <=0.25 SENSITIVE Sensitive     TRIMETH/SULFA >=320 RESISTANT Resistant     AMPICILLIN/SULBACTAM 4 SENSITIVE Sensitive     PIP/TAZO <=4 SENSITIVE Sensitive     * RARE ESCHERICHIA COLI  Acid Fast Smear (AFB)     Status: None   Collection Time: 07/23/20 11:54 AM   Specimen: PATH Digit amputation; Tissue  Result Value Ref Range Status   AFB Specimen Processing Concentration  Final   Acid Fast Smear Negative  Final    Comment: (NOTE) Performed At: [facility] [address] [place], [place] [id number] Jolene Schimke MD [id number]    Source (AFB) TISSUE  Final    Comment: Performed at [facility], [address] [place], [place] [address]         Radiology Studies: [place] EKG SITE RITE  Result Date: 07/26/2020 If Site Rite image not attached, placement could not be confirmed due to current cardiac rhythm.       Scheduled Meds: . aspirin EC  81 mg Oral Daily  . atorvastatin  20 mg Oral Daily  . carvedilol  12.5 mg Oral BID WC  . Chlorhexidine Gluconate Cloth  6 each Topical Daily  . clopidogrel  75 mg Oral Daily  . enoxaparin (LOVENOX) injection  70 mg Subcutaneous Q24H  . hydrALAZINE  25 mg Oral TID  . insulin aspart  0-15 Units Subcutaneous TID WC  . insulin aspart  0-5 Units Subcutaneous QHS  . [START ON 07/27/2020] losartan  100 mg Oral  Daily  . losartan  50 mg Oral NOW  . metroNIDAZOLE  500 mg Oral Q8H   Continuous Infusions: .  ceFAZolin (ANCEF) IV 2 g (07/26/20 1515)  . lactated ringers 10 mL/hr at 07/23/20 1800     LOS: 6 days    Time spent:    Erick Blinks, MD [facility]   If 7PM-7AM, please contact night-coverage [email or web address]  07/26/2020, 3:39 PM

## 2020-07-27 ENCOUNTER — Ambulatory Visit: Payer: Self-pay | Admitting: Cardiovascular Disease

## 2020-07-27 DIAGNOSIS — E118 Type 2 diabetes mellitus with unspecified complications: Secondary | ICD-10-CM

## 2020-07-27 DIAGNOSIS — I429 Cardiomyopathy, unspecified: Secondary | ICD-10-CM | POA: Diagnosis not present

## 2020-07-27 DIAGNOSIS — M869 Osteomyelitis, unspecified: Secondary | ICD-10-CM

## 2020-07-27 DIAGNOSIS — I5022 Chronic systolic (congestive) heart failure: Secondary | ICD-10-CM

## 2020-07-27 DIAGNOSIS — I739 Peripheral vascular disease, unspecified: Secondary | ICD-10-CM

## 2020-07-27 DIAGNOSIS — I96 Gangrene, not elsewhere classified: Secondary | ICD-10-CM | POA: Diagnosis not present

## 2020-07-27 LAB — CREATININE, SERUM
Creatinine, Ser: 1.17 mg/dL (ref 0.61–1.24)
GFR, Estimated: >60 mL/min (ref >60)

## 2020-07-27 LAB — GLUCOSE, CAPILLARY
Glucose-Capillary: 141 mg/dL — ABNORMAL HIGH (ref 70–99)
Glucose-Capillary: 206 mg/dL — ABNORMAL HIGH (ref 70–99)

## 2020-07-27 MED ORDER — HYDRALAZINE HCL 25 MG PO TABS: 25.0000 mg | ORAL_TABLET | Freq: Three times a day (TID) | ORAL | 1 refills | Status: DC

## 2020-07-27 MED ORDER — LOSARTAN POTASSIUM 50 MG PO TABS: 100.0000 mg | ORAL_TABLET | Freq: Every day | ORAL | 1 refills | Status: DC

## 2020-07-27 MED ORDER — CARVEDILOL 12.5 MG PO TABS: 12.5000 mg | ORAL_TABLET | Freq: Two times a day (BID) | ORAL | 1 refills | Status: DC

## 2020-07-27 MED ORDER — CEFAZOLIN IV (FOR PTA / DISCHARGE USE ONLY): 2.0000 g | Freq: Three times a day (TID) | INTRAVENOUS | 0 refills | Status: AC

## 2020-07-27 MED ORDER — OXYCODONE-ACETAMINOPHEN 5-325 MG PO TABS: 1.0000 | ORAL_TABLET | Freq: Four times a day (QID) | ORAL | 0 refills | Status: DC | PRN

## 2020-07-27 MED ORDER — HEPARIN SOD (PORK) LOCK FLUSH 100 UNIT/ML IV SOLN
250.0000 [IU] | INTRAVENOUS | Status: AC | PRN
  Administered 2020-07-27: 250 [IU]
  Filled 2020-07-27: qty 2.5

## 2020-07-27 MED ORDER — METRONIDAZOLE 500 MG PO TABS: 500.0000 mg | ORAL_TABLET | Freq: Three times a day (TID) | ORAL | 0 refills | Status: DC

## 2020-07-27 NOTE — Progress Notes (Signed)
Subjective: No new complaints   Antibiotics:  Anti-infectives (From admission, onward)   Start     Dose/Rate Route Frequency Ordered Stop   07/27/20 0000  ceFAZolin (ANCEF) IVPB        2 g Intravenous Every 8 hours 07/27/20 0910 09/03/20 2359   07/27/20 0000  metroNIDAZOLE (FLAGYL) 500 MG tablet        500 mg Oral Every 8 hours 07/27/20 0910     07/26/20 1400  ceFAZolin (ANCEF) IVPB 2g/100 mL premix        2 g 200 mL/hr over 30 Minutes Intravenous Every 8 hours 07/26/20 1033     07/26/20 1100  metroNIDAZOLE (FLAGYL) tablet 500 mg        500 mg Oral Every 8 hours 07/26/20 1008     07/23/20 1600  cefTRIAXone (ROCEPHIN) 2 g in sodium chloride 0.9 % 100 mL IVPB  Status:  Discontinued        2 g 200 mL/hr over 30 Minutes Intravenous Every 24 hours 07/23/20 1510 07/26/20 1018   07/23/20 1154  vancomycin (VANCOCIN) powder  Status:  Discontinued          As needed 07/23/20 1154 07/23/20 1233   07/22/20 1300  vancomycin (VANCOREADY) IVPB 1250 mg/250 mL  Status:  Discontinued        1,250 mg 166.7 mL/hr over 90 Minutes Intravenous Every 12 hours 07/22/20 1207 07/23/20 1510   07/22/20 1215  vancomycin (VANCOREADY) IVPB 1750 mg/350 mL  Status:  Discontinued        1,250 mg 125 mL/hr over 120 Minutes Intravenous Every 12 hours 07/22/20 1206 07/22/20 1207   07/21/20 2300  vancomycin (VANCOCIN) 1,750 mg in sodium chloride 0.9 % 500 mL IVPB  Status:  Discontinued        1,750 mg 250 mL/hr over 120 Minutes Intravenous Every 24 hours 07/20/20 2203 07/21/20 1433   07/21/20 2300  vancomycin (VANCOREADY) IVPB 1750 mg/350 mL  Status:  Discontinued        1,750 mg 175 mL/hr over 120 Minutes Intravenous Every 24 hours 07/21/20 1433 07/22/20 1206   07/21/20 2150  vancomycin (VANCOCIN) powder  Status:  Discontinued          As needed 07/21/20 2214 07/21/20 2216   07/21/20 0600  Ampicillin-Sulbactam (UNASYN) 3 g in sodium chloride 0.9 % 100 mL IVPB  Status:  Discontinued        3 g 200 mL/hr  over 30 Minutes Intravenous Every 8 hours 07/21/20 0105 07/23/20 1510   07/20/20 2145  vancomycin (VANCOREADY) IVPB 2000 mg/400 mL        2,000 mg 200 mL/hr over 120 Minutes Intravenous  Once 07/20/20 2142 07/21/20 0136   07/20/20 2100  piperacillin-tazobactam (ZOSYN) IVPB 3.375 g        3.375 g 100 mL/hr over 30 Minutes Intravenous  Once 07/20/20 2054 07/20/20 2211      Medications: Scheduled Meds: . aspirin EC  81 mg Oral Daily  . atorvastatin  20 mg Oral Daily  . carvedilol  12.5 mg Oral BID WC  . Chlorhexidine Gluconate Cloth  6 each Topical Daily  . clopidogrel  75 mg Oral Daily  . enoxaparin (LOVENOX) injection  70 mg Subcutaneous Q24H  . hydrALAZINE  25 mg Oral TID  . insulin aspart  0-15 Units Subcutaneous TID WC  . insulin aspart  0-5 Units Subcutaneous QHS  . losartan  100 mg Oral Daily  . metroNIDAZOLE  500 mg Oral Q8H   Continuous Infusions: .  ceFAZolin (ANCEF) IV 2 g (07/27/20 0519)  . lactated ringers 10 mL/hr at 07/23/20 1800   PRN Meds:.acetaminophen, furosemide, hydrALAZINE, hydrOXYzine, ondansetron **OR** ondansetron (ZOFRAN) IV, oxyCODONE-acetaminophen, sodium chloride flush    Objective: Weight change:   Intake/Output Summary (Last 24 hours) at 07/27/2020 1113 Last data filed at 07/27/2020 0400 Gross per 24 hour  Intake 340 ml  Output 950 ml  Net -610 ml   Blood pressure 122/69, pulse 66, temperature 98.5 F (36.9 C), temperature source Oral, resp. rate 16, height 5\' 9"  (1.753 m), weight (!) 155.7 kg, SpO2 94 %. Temp:  [98.3 F (36.8 C)-98.6 F (37 C)] 98.5 F (36.9 C) (04/19 0522) Pulse Rate:  [66-85] 66 (04/19 0522) Resp:  [16-20] 16 (04/19 0522) BP: (122-198)/(69-100) 122/69 (04/19 0522) SpO2:  [94 %] 94 % (04/19 0522)  Physical Exam: Physical Exam Constitutional:      Appearance: He is well-developed.  HENT:     Head: Normocephalic and atraumatic.  Eyes:     Conjunctiva/sclera: Conjunctivae normal.  Cardiovascular:     Rate and  Rhythm: Normal rate and regular rhythm.  Pulmonary:     Effort: Pulmonary effort is normal. No respiratory distress.     Breath sounds: No wheezing.  Abdominal:     General: There is no distension.     Palpations: Abdomen is soft.  Musculoskeletal:        General: Normal range of motion.     Cervical back: Normal range of motion and neck supple.  Skin:    General: Skin is warm and dry.     Findings: No erythema or rash.  Neurological:     General: No focal deficit present.     Mental Status: He is alert and oriented to person, place, and time.  Psychiatric:        Mood and Affect: Mood normal.        Behavior: Behavior normal.        Thought Content: Thought content normal.        Judgment: Judgment normal.     Foot with bandage  CBC:    BMET Recent Labs    07/25/20 0719 07/27/20 0358  NA 136  --   K 3.8  --   CL 101  --   CO2 27  --   GLUCOSE 138*  --   BUN 5*  --   CREATININE 1.06 1.17  CALCIUM 8.5*  --      Liver Panel  No results for input(s): PROT, ALBUMIN, AST, ALT, ALKPHOS, BILITOT, BILIDIR, IBILI in the last 72 hours.     Sedimentation Rate No results for input(s): ESRSEDRATE in the last 72 hours. C-Reactive Protein No results for input(s): CRP in the last 72 hours.  Micro Results: Recent Results (from the past 720 hour(s))  SARS CORONAVIRUS 2 (TAT 6-24 HRS) Nasopharyngeal Nasopharyngeal Swab     Status: None   Collection Time: 07/12/20  8:18 AM   Specimen: Nasopharyngeal Swab  Result Value Ref Range Status   SARS Coronavirus 2 NEGATIVE NEGATIVE Final    Comment: (NOTE) SARS-CoV-2 target nucleic acids are NOT DETECTED.  The SARS-CoV-2 RNA is generally detectable in upper and lower respiratory specimens during the acute phase of infection. Negative results do not preclude SARS-CoV-2 infection, do not rule out co-infections with other pathogens, and should not be used as the sole basis for treatment or other patient management  decisions. Negative results must  be combined with clinical observations, patient history, and epidemiological information. The expected result is Negative.  Fact Sheet for Patients: HairSlick.no  Fact Sheet for Healthcare Providers: quierodirigir.com  This test is not yet approved or cleared by the [place] FDA and  has been authorized for detection and/or diagnosis of SARS-CoV-2 by FDA under an Emergency Use Authorization (EUA). This EUA will remain  in effect (meaning this test can be used) for the duration of the COVID-19 declaration under Se ction 564(b)(1) of the Act, 21 U.S.C. section 360bbb-3(b)(1), unless the authorization is terminated or revoked sooner.  Performed at [facility], [address] [place], [place] [address]   SARS Coronavirus 2 by RT PCR (hospital order, performed in [facility] hospital lab) Nasopharyngeal Nasopharyngeal Swab     Status: None   Collection Time: 07/14/20  7:51 AM   Specimen: Nasopharyngeal Swab  Result Value Ref Range Status   SARS Coronavirus 2 NEGATIVE NEGATIVE Final    Comment: (NOTE) SARS-CoV-2 target nucleic acids are NOT DETECTED.  The SARS-CoV-2 RNA is generally detectable in upper and lower respiratory specimens during the acute phase of infection. The lowest concentration of SARS-CoV-2 viral copies this assay can detect is 250 copies / mL. A negative result does not preclude SARS-CoV-2 infection and should not be used as the sole basis for treatment or other patient management decisions.  A negative result may occur with improper specimen collection / handling, submission of specimen other than nasopharyngeal swab, presence of viral mutation(s) within the areas targeted by this assay, and inadequate number of viral copies (<250 copies / mL). A negative result must be combined with clinical observations, patient history, and epidemiological information.  Fact  Sheet for Patients:   BoilerBrush.com.cy  Fact Sheet for Healthcare Providers: https://pope.com/  This test is not yet approved or  cleared by the [place] FDA and has been authorized for detection and/or diagnosis of SARS-CoV-2 by FDA under an Emergency Use Authorization (EUA).  This EUA will remain in effect (meaning this test can be used) for the duration of the COVID-19 declaration under Section 564(b)(1) of the Act, 21 U.S.C. section 360bbb-3(b)(1), unless the authorization is terminated or revoked sooner.  Performed at [facility], [address], [place], [place] [address]   Resp Panel by RT-PCR (Flu A&B, Covid) Nasopharyngeal Swab     Status: None   Collection Time: 07/20/20  6:30 PM   Specimen: Nasopharyngeal Swab; Nasopharyngeal(NP) swabs in vial transport medium  Result Value Ref Range Status   SARS Coronavirus 2 by RT PCR NEGATIVE NEGATIVE Final    Comment: (NOTE) SARS-CoV-2 target nucleic acids are NOT DETECTED.  The SARS-CoV-2 RNA is generally detectable in upper respiratory specimens during the acute phase of infection. The lowest concentration of SARS-CoV-2 viral copies this assay can detect is 138 copies/mL. A negative result does not preclude SARS-Cov-2 infection and should not be used as the sole basis for treatment or other patient management decisions. A negative result may occur with  improper specimen collection/handling, submission of specimen other than nasopharyngeal swab, presence of viral mutation(s) within the areas targeted by this assay, and inadequate number of viral copies(<138 copies/mL). A negative result must be combined with clinical observations, patient history, and epidemiological information. The expected result is Negative.  Fact Sheet for Patients:  BloggerCourse.com  Fact Sheet for Healthcare Providers:   SeriousBroker.it  This test is no t yet approved or cleared by the [place] FDA and  has  been authorized for detection and/or diagnosis of SARS-CoV-2 by FDA under an Emergency Use Authorization (EUA). This EUA will remain  in effect (meaning this test can be used) for the duration of the COVID-19 declaration under Section 564(b)(1) of the Act, 21 U.S.C.section 360bbb-3(b)(1), unless the authorization is terminated  or revoked sooner.       Influenza A by PCR NEGATIVE NEGATIVE Final   Influenza B by PCR NEGATIVE NEGATIVE Final    Comment: (NOTE) The Xpert Xpress SARS-CoV-2/FLU/RSV plus assay is intended as an aid in the diagnosis of influenza from Nasopharyngeal swab specimens and should not be used as a sole basis for treatment. Nasal washings and aspirates are unacceptable for Xpert Xpress SARS-CoV-2/FLU/RSV testing.  Fact Sheet for Patients: BloggerCourse.com  Fact Sheet for Healthcare Providers: SeriousBroker.it  This test is not yet approved or cleared by the [place] FDA and has been authorized for detection and/or diagnosis of SARS-CoV-2 by FDA under an Emergency Use Authorization (EUA). This EUA will remain in effect (meaning this test can be used) for the duration of the COVID-19 declaration under Section 564(b)(1) of the Act, 21 U.S.C. section 360bbb-3(b)(1), unless the authorization is terminated or revoked.  Performed at [facility], [address] [place], [place] [address]   Blood culture (routine x 2)     Status: None   Collection Time: 07/20/20  8:42 PM   Specimen: BLOOD  Result Value Ref Range Status   Specimen Description BLOOD SITE NOT SPECIFIED  Final   Special Requests   Final    BOTTLES DRAWN AEROBIC AND ANAEROBIC Blood Culture adequate volume   Culture   Final    NO GROWTH 5 DAYS Performed at [facility], [address] [place], [place] [address]     Report Status 07/25/2020 FINAL  Final  Blood culture (routine x 2)     Status: None   Collection Time: 07/20/20  9:13 PM   Specimen: BLOOD  Result Value Ref Range Status   Specimen Description BLOOD SITE NOT SPECIFIED  Final   Special Requests   Final    BOTTLES DRAWN AEROBIC AND ANAEROBIC Blood Culture adequate volume   Culture   Final    NO GROWTH 5 DAYS Performed at [facility], [address], [place], [place] [address]    Report Status 07/25/2020 FINAL  Final  Surgical pcr screen     Status: None   Collection Time: 07/21/20  4:50 PM   Specimen: Nasal Mucosa; Nasal Swab  Result Value Ref Range Status   MRSA, PCR NEGATIVE NEGATIVE Final   Staphylococcus aureus NEGATIVE NEGATIVE Final    Comment: (NOTE) The Xpert SA Assay (FDA approved for NASAL specimens in patients 105 years of age and older), is one component of a comprehensive surveillance program. It is not intended to diagnose infection nor to guide or monitor treatment. Performed at [facility], [address], [place], [place] [address]   Fungus Culture With Stain     Status: None (Preliminary result)   Collection Time: 07/21/20  9:49 PM   Specimen: Wound; Tissue  Result Value Ref Range Status   Fungus Stain Final report  Final    Comment: (NOTE) Performed At: [facility] [address] [place], [place] [id number] Jolene Schimke MD [id number]    Fungus (Mycology) Culture PENDING  Incomplete   Fungal Source TOE  Final    Comment: RIGHT GREAT Performed at [facility], [address] [place], [place] [address]   Aerobic/Anaerobic Culture w Gram Stain (surgical/deep wound)     Status: None   Collection Time: 07/21/20  9:49 PM   Specimen: Wound; Tissue  Result Value Ref Range Status   Specimen Description WOUND  Final   Special Requests RIGHT GREAT TOE  Final   Gram Stain   Final    MODERATE WBC PRESENT, PREDOMINANTLY PMN ABUNDANT GRAM POSITIVE COCCI ABUNDANT GRAM NEGATIVE  RODS RARE GRAM VARIABLE ROD RESULT CALLED TO, READ BACK BY AND VERIFIED WITH: DR Samuella Cota 07/21/20 2303 JDW    Culture   Final    FEW ESCHERICHIA COLI FEW STREPTOCOCCUS AGALACTIAE TESTING AGAINST S. AGALACTIAE NOT ROUTINELY PERFORMED DUE TO PREDICTABILITY OF AMP/PEN/VAN SUSCEPTIBILITY. NO ANAEROBES ISOLATED Performed at [facility], [address] [place], [place] [address]    Report Status 07/26/2020 FINAL  Final   Organism ID, Bacteria ESCHERICHIA COLI  Final      Susceptibility   Escherichia coli - MIC*    AMPICILLIN >=32 RESISTANT Resistant     CEFAZOLIN <=4 SENSITIVE Sensitive     CEFEPIME <=0.12 SENSITIVE Sensitive     CEFTAZIDIME <=1 SENSITIVE Sensitive     CEFTRIAXONE <=0.25 SENSITIVE Sensitive     CIPROFLOXACIN <=0.25 SENSITIVE Sensitive     GENTAMICIN <=1 SENSITIVE Sensitive     IMIPENEM <=0.25 SENSITIVE Sensitive     TRIMETH/SULFA >=320 RESISTANT Resistant     AMPICILLIN/SULBACTAM 8 SENSITIVE Sensitive     PIP/TAZO <=4 SENSITIVE Sensitive     * FEW ESCHERICHIA COLI  Fungus Culture Result     Status: None   Collection Time: 07/21/20  9:49 PM  Result Value Ref Range Status   Result 1 Comment  Final    Comment: (NOTE) KOH/Calcofluor preparation:  no fungus observed. Performed At: [facility] [address] [place], [place] [id number] Jolene Schimke MD [id number]   Fungus Culture With Stain     Status: None (Preliminary result)   Collection Time: 07/23/20 11:54 AM   Specimen: PATH Digit amputation; Tissue  Result Value Ref Range Status   Fungus Stain Final report  Final    Comment: (NOTE) Performed At: [facility] [address] [place], [place] [id number] Jolene Schimke MD [id number]    Fungus (Mycology) Culture PENDING  Incomplete   Fungal Source TISSUE  Final    Comment: Performed at [facility], [address], [place], [place] [address]  Aerobic/Anaerobic Culture w Gram Stain (surgical/deep wound)     Status: None  (Preliminary result)   Collection Time: 07/23/20 11:54 AM   Specimen: PATH Digit amputation; Tissue  Result Value Ref Range Status   Specimen Description TISSUE  Final   Special Requests BONE FIRST METATARSAL OF RIGHT FOOT SPEC A  Final   Gram Stain   Final    NO WBC SEEN NO ORGANISMS SEEN Performed at [facility], [address] [place], [place] [address]    Culture   Final    RARE ESCHERICHIA COLI RARE GROUP B STREP(S.AGALACTIAE)ISOLATED TESTING AGAINST S. AGALACTIAE NOT ROUTINELY PERFORMED DUE TO PREDICTABILITY OF AMP/PEN/VAN SUSCEPTIBILITY. NO ANAEROBES ISOLATED; CULTURE IN PROGRESS FOR 5 DAYS    Report Status PENDING  Incomplete   Organism ID, Bacteria ESCHERICHIA COLI  Final      Susceptibility   Escherichia coli - MIC*    AMPICILLIN >=32 RESISTANT Resistant     CEFAZOLIN <=4 SENSITIVE Sensitive     CEFEPIME <=0.12 SENSITIVE Sensitive  CEFTAZIDIME <=1 SENSITIVE Sensitive     CEFTRIAXONE <=0.25 SENSITIVE Sensitive     CIPROFLOXACIN <=0.25 SENSITIVE Sensitive     GENTAMICIN <=1 SENSITIVE Sensitive     IMIPENEM <=0.25 SENSITIVE Sensitive     TRIMETH/SULFA >=320 RESISTANT Resistant     AMPICILLIN/SULBACTAM 4 SENSITIVE Sensitive     PIP/TAZO <=4 SENSITIVE Sensitive     * RARE ESCHERICHIA COLI  Acid Fast Smear (AFB)     Status: None   Collection Time: 07/23/20 11:54 AM   Specimen: PATH Digit amputation; Tissue  Result Value Ref Range Status   AFB Specimen Processing Concentration  Final   Acid Fast Smear Negative  Final    Comment: (NOTE) Performed At: [facility] [address] [place], [place] [id number] Jolene Schimke MD [id number]    Source (AFB) TISSUE  Final    Comment: Performed at [facility], [address] [place], [place] [address]  Fungus Culture Result     Status: None   Collection Time: 07/23/20 11:54 AM  Result Value Ref Range Status   Result 1 Comment  Final    Comment: (NOTE) KOH/Calcofluor preparation:  no fungus  observed. Performed At: [facility] [address] [place], [place] [id number] Jolene Schimke MD [id number]     Studies/Results: [place] EKG SITE RITE  Result Date: 07/26/2020 If [facility] image not attached, placement could not be confirmed due to current cardiac rhythm.     Assessment/Plan:  INTERVAL HISTORY: Patient has had PICC line placed   Principal Problem:   Gangrene of toe of right foot (HCC) Active Problems:   Diabetes mellitus type 2, uncontrolled, with complications (HCC)   OBESITY, MORBID   Essential hypertension   Cardiomyopathy (HCC)   Chronic systolic heart failure (HCC)   PVD (peripheral vascular disease) (HCC)    Austin Shaw is a 55 y.o. male with these mellitus peripheral vascular disease status post revascularization with gangrenous toe with osteomyelitis status post I&D of bursal collection and amputation of first toe then repeat trip to the OR for debridement and resection with cultures from both surgeries yielding group B streptococcus and E. coli that was fairly sensitive.  We have placed him on IV cefazolin along with oral metronidazole.  Patient has PICC line in place.  I have again informed him of his follow-up appointments was on 19 May at 1030 with me.  He will follow-up closely with podiatry   LOS: 7 days   Acey Lav 07/27/2020, 11:13 AM

## 2020-07-27 NOTE — Discharge Summary (Addendum)
Physician Discharge Summary  Austin Shaw ZOX:[id number] DOB: [birth year] DOA: 07/20/2020  PCP: Tonia Ghent, MD  Admit date: 07/20/2020 Discharge date: 07/27/2020  Admitted From: home Disposition:   home  Recommendations for Outpatient Follow-up:  1. Follow up with PCP in 1-2 weeks 2. Please obtain BMP/CBC in one week 3. Follow up with podiatry on 4/22 for dressing change 4. Follow up with infectious disease on 5/19 5. Continue on IV cefazolin and oral metronidazole until 5/26  Home Health:home health RN Equipment/Devices:PICC line, shower chair  Discharge Condition:stable CODE STATUS:full code Diet recommendation: heart healthy, carb modified  Brief/Interim Summary: 55 year old male with history of diabetes, peripheral vascular disease, morbid obesity, hypertension, admitted to the hospital with right great toe osteomyelitis/gangrene.  He has been started on intravenous antibiotics.  Podiatry following and plans on likely amputation.  He recently had revascularization done of his right lower extremity.  Seen by vascular surgery who did not feel that any further revascularization was indicated at this time.  Discharge Diagnoses:  Principal Problem:   Gangrene of toe of right foot ([place]) Active Problems:   Diabetes mellitus type 2, uncontrolled, with complications (HCC)   OBESITY, MORBID   Essential hypertension   Cardiomyopathy ([place])   Chronic systolic heart failure (HCC)   PVD (peripheral vascular disease) (H[facility])  Osteomyelitis and gangrene right great toe -Currently on intravenous antibiotics -Podiatry following and patient underwent amputation of his toe on 4/13 -He subsequently went back to the OR on 4/15 for delayed wound closure and partial metatarsal resection and sesamoidectomy -Seen by vascular surgery and it was not felt that he needed further revascularization at this point -Blood cultures have shown no growth -Leukocytosis trending down -Intraoperative  cultures show strep agalactiae and E coli -appreciate ID input -plan is for 6 weeks of ancef and metronidazole to be completed on 5/26 -will follow up with podiatry on Friday for dressing change -will follow up with ID in 4 weeks  Sepsis -present on admission -patient noted to have leukocytosis and tachycardia -source was osteomyelitis of right great toe -evidence of organ dysfunction was elevated lactic acid -blood cultures negative -he was treated with IV antibiotics -sepsis physiology resolved.  Acute kidney injury -Baseline creatinine of 1.1 -Admitted with creatinine of 1.6 -Resolved with IV fluids   Diabetes, type II, uncontrolled, with hyperglycemia -Holding home doses of Metformin and Trulicity -Currently on sliding scale insulin -Blood sugars currently stable -resume home meds on discharge  Peripheral vascular disease -Recently had Abdominal Aortic Angiogram with Bi-Iliofemoral Runoff,Right TP trunk and peroneal artery orbital atherectomy and balloon angioplastyAndRight popliteal artery drug-coated balloon angioplasty on 4/6 by Dr. Fletcher Anon -He has been on aspirin and Plavix -Seen by vascular surgery and it was not felt that further revascularization was indicated at this point  Hypertension -Continue on Coreg, losartan -Blood pressure were running high -coreg and losartan doses were adjusted -hydralazine also added  Obesity, class III -BMI 50.6 -Counseled on importance of diet and exercise  Discharge Instructions  Discharge Instructions    [facility] pharmacist to adjust dose for Vancomycin, Aminoglycosides and other anti-infective therapies as requested by physician.   Complete by: As directed    [facility] to provide Cath Flo 32m   Complete by: As directed    Administer for PICC line occlusion and as ordered by physician for other access device issues.   Anaphylaxis Kit: Provided to treat any anaphylactic reaction to the  medication being provided to the patient if First Dose or  when requested by physician   Complete by: As directed    Epinephrine 38m/ml vial / amp: Administer 0.335m(0.57m75msubcutaneously once for moderate to severe anaphylaxis, nurse to call physician and pharmacy when reaction occurs and call 911 if needed for immediate care   Diphenhydramine 13m94m IV vial: Administer 25-13mg10mIM PRN for first dose reaction, rash, itching, mild reaction, nurse to call physician and pharmacy when reaction occurs   Sodium Chloride 0.9% NS 500ml 16mAdminister if needed for hypovolemic blood pressure drop or as ordered by physician after call to physician with anaphylactic reaction   Change dressing on IV access line weekly and PRN   Complete by: As directed    Diet - low sodium heart healthy   Complete by: As directed    Flush IV access with Sodium Chloride 0.9% and Heparin 10 units/ml or 100 units/ml   Complete by: As directed    Home infusion instructions - [facility]   Complete by: As directed    Instructions: Flush IV access with Sodium Chloride 0.9% and Heparin 10units/ml or 100units/ml   Change dressing on IV access line: Weekly and PRN   Instructions Cath Flo 2mg: A15mnister for PICC Line occlusion and as ordered by physician for other access device   [facility] pharmacist to adjust dose for: Vancomycin, Aminoglycosides and other anti-infective therapies as requested by physician   Increase activity slowly   Complete by: As directed    Method of administration may be changed at the discretion of home infusion pharmacist based upon assessment of the patient and/or caregiver's ability to self-administer the medication ordered   Complete by: As directed    No wound care   Complete by: As directed      Allergies as of 07/27/2020      Reactions   Lipitor [atorvastatin]    intolerant      Medication List    STOP taking these medications   doxycycline 100 MG  tablet Commonly known as: VIBRA-TABS     TAKE these medications   aspirin EC 81 MG tablet Take 1 tablet (81 mg total) by mouth daily. Swallow whole.   atorvastatin 40 MG tablet Commonly known as: LIPITOR Take 0.5 tablets (20 mg total) by mouth daily.   carvedilol 12.5 MG tablet Commonly known as: COREG Take 1 tablet (12.5 mg total) by mouth 2 (two) times daily with a meal. What changed:   medication strength  how much to take   ceFAZolin  IVPB Commonly known as: ANCEF Inject 2 g into the vein every 8 (eight) hours. Indication:  Osteomyelitis First Dose: Yes Last Day of Therapy:  09/02/20 Labs - Once weekly:  CBC/D and BMP, Labs - Every other week:  ESR and CRP Method of administration: IV Push Method of administration may be changed at the discretion of home infusion pharmacist based upon assessment of the patient and/or caregiver's ability to self-administer the medication ordered.   clopidogrel 75 MG tablet Commonly known as: Plavix Take 1 tablet (75 mg total) by mouth daily.   furosemide 40 MG tablet Commonly known as: LASIX Take 1 tablet (40 mg total) by mouth daily as needed for fluid.   glucose blood test strip CHECK BLOOD SUGAR TWICE DAILY   hydrALAZINE 25 MG tablet Commonly known as: APRESOLINE Take 1 tablet (25 mg total) by mouth 3 (three) times daily.   losartan 50 MG tablet Commonly known as: COZAAR Take 2 tablets (100 mg total) by mouth daily. What  changed:   medication strength  how much to take   metFORMIN 500 MG tablet Commonly known as: GLUCOPHAGE Take 500-1,000 mg by mouth See admin instructions. Take 1000 mg in the morning and 500 mg in the evening   metroNIDAZOLE 500 MG tablet Commonly known as: FLAGYL Take 1 tablet (500 mg total) by mouth every 8 (eight) hours.   ONE TOUCH ULTRA SYSTEM KIT w/Device Kit Check blood sugar twice a day and as directed. Dx E11.8   oxyCODONE-acetaminophen 5-325 MG tablet Commonly known as:  PERCOCET/ROXICET Take 1-2 tablets by mouth every 6 (six) hours as needed for moderate pain.   Trulicity 3.55 HR/4.1UL Sopn Generic drug: Dulaglutide Inject 0.75 mg into the skin once a week. Every Friday in the evening            Durable Medical Equipment  (From admission, onward)         Start     Ordered   07/27/20 1142  For home use only DME Shower stool  Once        07/27/20 1141   07/27/20 1058  For home use only DME Other see comment  Once       Comments: Knee scooter  Question:  Length of Need  Answer:  6 Months   07/27/20 1057           Discharge Care Instructions  (From admission, onward)         Start     Ordered   07/27/20 0000  Change dressing on IV access line weekly and PRN  (Home infusion instructions - [facility] )        07/27/20 0910          Follow-up Information    Trula Slade, DPM Follow up on 07/30/2020.   Specialty: Podiatry Why: for dressing change Contact information: [address] [place] [address] [phone]        Tommy Medal, Lavell Islam, MD Follow up on 08/26/2020.   Specialty: Infectious Diseases Why: 10:30am Contact information: [address] [place] [address] [phone]        Tonia Ghent, MD. Schedule an appointment as soon as possible for a visit in 1 week(s).   Specialty: Family Medicine Contact information: [place] [address] [phone]              Allergies  Allergen Reactions  . Lipitor [Atorvastatin]     intolerant    Consultations:  Podiatry  Vascular surgery  Infectious disease   Procedures/Studies: MR FOOT RIGHT W WO CONTRAST  Result Date: 07/21/2020 CLINICAL DATA:  Open wound involving the great toe. EXAM: MRI OF THE RIGHT FOREFOOT WITHOUT AND WITH CONTRAST TECHNIQUE: Multiplanar, multisequence MR imaging of the right foot was performed before and after the administration of intravenous contrast.  CONTRAST:  57m GADAVIST GADOBUTROL 1 MMOL/ML IV SOLN COMPARISON:  Radiographs 07/20/2020 FINDINGS: There is a open wound noted along the medial aspect of the great toe which appears to go right down close to the cortex of the bone near the level of the interphalangeal joint. Associated surrounding soft tissue swelling/edema/fluid consistent with cellulitis. No discrete rim enhancing abscess is identified. There is also a second smaller open wound on the dorsal aspect of the toe. Abnormal T1 and T2 signal intensity in the distal phalanx consistent with osteomyelitis. There is also mild enhancement. Could not exclude septic arthritis at the interphalangeal joint. Diffuse myofasciitis without  findings for pyomyositis. IMPRESSION: 1. Open wounds along the medial and dorsal aspect of the great toe. Associated cellulitis and myofasciitis without findings for discrete drainable soft tissue abscess or pyomyositis. 2. MR findings consistent with osteomyelitis involving the distal phalanx of the great toe. Possible septic arthritis at the interphalangeal joint. Electronically Signed   By: Marijo Sanes M.D.   On: 07/21/2020 07:58   PERIPHERAL VASCULAR CATHETERIZATION  Result Date: 07/14/2020 1.  No significant aortoiliac disease. 2.  Right lower extremity: No significant SFA disease, borderline significant distal popliteal artery stenosis followed by an occluded TP trunk into the proximal portion of the peroneal artery.  Occluded anterior tibial artery at its origin with reconstitution in the mid calf.  Occluded posterior tibial artery at its origin with reconstitution in the distal calf. 3.  Successful orbital atherectomy and balloon angioplasty of the right TP trunk into the peroneal artery as well drug-coated balloon angioplasty of the distal popliteal artery. Recommendations: Dual antiplatelet therapy for few months. Aggressive treatment of risk factors. Suspect good improvement in blood flow with today's procedure  given excellent collaterals to the anterior and posterior tibial artery distally.  However, if healing is not optimal, we might need to consider retrograde revascularization of the anterior tibial artery.  DG Foot 2 Views Right  Result Date: 07/23/2020 CLINICAL DATA:  Status post right foot surgery EXAM: RIGHT FOOT - 2 VIEW COMPARISON:  07/21/2020 FINDINGS: Interval partial amputation of the distal half of the first metatarsal with postsurgical changes in the overlying soft tissues. Otherwise no acute fracture or dislocation. No aggressive osseous lesion. Peripheral vascular atherosclerotic disease. IMPRESSION: 1. Interval partial amputation of the distal half of the first metatarsal with postsurgical changes in the overlying soft tissues. Electronically Signed   By: Kathreen Devoid   On: 07/23/2020 15:42   DG Foot 2 Views Right  Result Date: 07/21/2020 CLINICAL DATA:  Postop. EXAM: RIGHT FOOT - 2 VIEW COMPARISON:  Preoperative imaging. FINDINGS: Interval resection of the great toe. Overlying dressing in the operative bed. Remainder the foot is unchanged. There are vascular calcifications. IMPRESSION: Interval resection of the great toe. Electronically Signed   By: Keith Rake M.D.   On: 07/21/2020 22:36   DG Foot Complete Right  Result Date: 07/20/2020 CLINICAL DATA:  Right foot ulceration, pain EXAM: RIGHT FOOT COMPLETE - 3+ VIEW COMPARISON:  06/29/2020 FINDINGS: Frontal, oblique, and lateral views of the right foot are obtained. Soft tissue ulceration medial aspect first digit has progressed since prior study, with subcutaneous gas identified. Erosive changes are seen at the base of the first distal phalanx compatible with osteomyelitis. There are no acute displaced fractures. Diffuse vascular calcifications are noted. IMPRESSION: 1. Progressive soft tissue ulceration of the first digit, with osteomyelitis of the first distal phalanx. Electronically Signed   By: Randa Ngo M.D.   On: 07/20/2020  21:25   DG Foot Complete Right  Result Date: 06/29/2020 Please see detailed radiograph report in office note.  VAS [place] LOWER EXT ART SEG MULTI (SEGMENTALS & LE RAYNAUDS)  Result Date: 07/08/2020 LOWER EXTREMITY DOPPLER STUDY Indications: Right great toe ulceration without trauma. Noticed 3 weeks ago.Does              not believe its getting worse. Denied any claudication-like              symptoms and works long hours on his feet, both standing and  walking. High Risk Factors: Hypertension, hyperlipidemia, no history of smoking. Other Factors: Poorly controlled diabetes. Absent pedal pulses bilaterally.  Comparison Study: None on record Performing Technologist: Pilar Jarvis RDMS, RVT, RDCS  Examination Guidelines: A complete evaluation includes at minimum, Doppler waveform signals and systolic blood pressure reading at the level of bilateral brachial, anterior tibial, and posterior tibial arteries, when vessel segments are accessible. Bilateral testing is considered an integral part of a complete examination. Photoelectric Plethysmograph (PPG) waveforms and toe systolic pressure readings are included as required and additional duplex testing as needed. Limited examinations for reoccurring indications may be performed as noted.  ABI Findings: +---------+------------------+-----+----------+--------+ Right    Rt Pressure (mmHg)IndexWaveform  Comment  +---------+------------------+-----+----------+--------+ Brachial 162                    triphasic          +---------+------------------+-----+----------+--------+ Popliteal                       triphasic          +---------+------------------+-----+----------+--------+ ATA      100               0.62 monophasic         +---------+------------------+-----+----------+--------+ PTA      87                0.54 monophasic         +---------+------------------+-----+----------+--------+ PERO     97                0.60  monophasic         +---------+------------------+-----+----------+--------+ Great Toe                       Absent             +---------+------------------+-----+----------+--------+ +---------+------------------+-----+----------+-------+ Left     Lt Pressure (mmHg)IndexWaveform  Comment +---------+------------------+-----+----------+-------+ Brachial 159                    triphasic         +---------+------------------+-----+----------+-------+ Popliteal                       triphasic         +---------+------------------+-----+----------+-------+ ATA      141               0.87 triphasic         +---------+------------------+-----+----------+-------+ PTA      93                0.57 monophasic        +---------+------------------+-----+----------+-------+ PERO     130               0.80 triphasic         +---------+------------------+-----+----------+-------+ Ellen Henri               0.69 Normal            +---------+------------------+-----+----------+-------+ +-------+-----------+-----------+------------+------------+ ABI/TBIToday's ABIToday's TBIPrevious ABIPrevious TBI +-------+-----------+-----------+------------+------------+ Right  0.62                                           +-------+-----------+-----------+------------+------------+ Left   0.87       0.69                                +-------+-----------+-----------+------------+------------+  TOES Findings: +----------+---------------+--------+-------+ Right ToesPressure (mmHg)WaveformComment +----------+---------------+--------+-------+ 1st Digit                Absent          +----------+---------------+--------+-------+ 2nd Digit                Normal          +----------+---------------+--------+-------+ 3rd Digit                Normal          +----------+---------------+--------+-------+ 4th Digit                Abnormal         +----------+---------------+--------+-------+ 5th Digit                Abnormal        +----------+---------------+--------+-------+  +---------+---------------+--------+-------+ Left ToesPressure (mmHg)WaveformComment +---------+---------------+--------+-------+ 1st Digit               Normal          +---------+---------------+--------+-------+ 2nd Digit               Normal          +---------+---------------+--------+-------+ 3rd Digit               Normal          +---------+---------------+--------+-------+ 4th Digit               Normal          +---------+---------------+--------+-------+ 5th Digit               Normal          +---------+---------------+--------+-------+   Dr Fletcher Anon saw patient immediately folowing this exam  Summary: Right: Resting right ankle-brachial index indicates moderate right lower extremity arterial disease. The right toe-brachial index is abnormal. Left: Resting left ankle-brachial index indicates mild left lower extremity arterial disease. The left toe-brachial index is normal.  *See table(s) above for measurements and observations.  Vascular consult recommended. Electronically signed by Jenkins Rouge MD on 07/08/2020 at 3:25:44 PM.    Final    VAS [place] LOWER EXTREMITY ARTERIAL DUPLEX  Result Date: 07/21/2020 LOWER EXTREMITY ARTERIAL DUPLEX STUDY Indications: Ulceration, peripheral artery disease, and Evaluate patency of TP              trunk/peroneal intervention.  Vascular Interventions: 07-14-2020 Right TP trunk and peroneal atherectomy and                         balloon angioplasty. Right poplital balloon angioplasty. Current ABI:            07-08-2020 ABI was RT 0.62 LT 0.87 Limitations: Comparison Study: 07-08-2020 Prior lower extremity arterial bilateral study                   showed monophasic waveforms in the right TP trunk, ATA, and                   peroneal. Performing Technologist: Darlin Coco RDMS,RVT  Examination Guidelines: A  complete evaluation includes B-mode imaging, spectral Doppler, color Doppler, and power Doppler as needed of all accessible portions of each vessel. Bilateral testing is considered an integral part of a complete examination. Limited examinations for reoccurring indications may be performed as noted.  +-----------+--------+-----+---------------+----------+--------+ RIGHT      PSV cm/sRatioStenosis       Waveform  Comments +-----------+--------+-----+---------------+----------+--------+ CFA Prox   144  triphasic          +-----------+--------+-----+---------------+----------+--------+ CFA Mid    130                         triphasic          +-----------+--------+-----+---------------+----------+--------+ CFA Distal 119                         triphasic          +-----------+--------+-----+---------------+----------+--------+ DFA        77                          biphasic           +-----------+--------+-----+---------------+----------+--------+ SFA Prox   152                         triphasic          +-----------+--------+-----+---------------+----------+--------+ SFA Mid    144                         triphasic          +-----------+--------+-----+---------------+----------+--------+ SFA Distal 114                         triphasic          +-----------+--------+-----+---------------+----------+--------+ POP Prox   166                         triphasic          +-----------+--------+-----+---------------+----------+--------+ POP Mid    61                          monophasic         +-----------+--------+-----+---------------+----------+--------+ POP Distal 84                          monophasic         +-----------+--------+-----+---------------+----------+--------+ TP Trunk   354          50-74% stenosis                   +-----------+--------+-----+---------------+----------+--------+ ATA Prox   40                           monophasic         +-----------+--------+-----+---------------+----------+--------+ ATA Mid    45                          monophasic         +-----------+--------+-----+---------------+----------+--------+ ATA Distal 63                          monophasic         +-----------+--------+-----+---------------+----------+--------+ PTA Prox   147                                            +-----------+--------+-----+---------------+----------+--------+ PTA Distal 140  monophasic         +-----------+--------+-----+---------------+----------+--------+ PERO Prox  123                         monophasic         +-----------+--------+-----+---------------+----------+--------+ PERO Mid   144                         monophasic         +-----------+--------+-----+---------------+----------+--------+ PERO Distal116                         monophasic         +-----------+--------+-----+---------------+----------+--------+ DP         78                          monophasic         +-----------+--------+-----+---------------+----------+--------+   Summary: Right: 30-49% stenosis noted in the popliteal artery. 50-74% stenosis noted in the TP trunk. Monophasic waveforms in the DPA, PTA, and Pero A.  See table(s) above for measurements and observations. Electronically signed by Monica Martinez MD on 07/21/2020 at 5:42:49 PM.    Final    VAS [place] LOWER EXTREMITY ARTERIAL DUPLEX  Result Date: 07/08/2020 LOWER EXTREMITY ARTERIAL DUPLEX STUDY Indications: Ulceration right great toe. Noticed 3 weeks ago. No claudication              while being on his feet at work for hours. High Risk Factors: Hypertension, hyperlipidemia. Other Factors: Poorly controlled diabetes and absent pedal pulses bilaterally.  Current ABI: 0.62 right, 0.87 left Comparison Study: None                    See arterial Doppler exam of the same date Performing  Technologist: Pilar Jarvis RDMS, RVT, RDCS  Examination Guidelines: A complete evaluation includes B-mode imaging, spectral Doppler, color Doppler, and power Doppler as needed of all accessible portions of each vessel. Bilateral testing is considered an integral part of a complete examination. Limited examinations for reoccurring indications may be performed as noted.  +----------+--------+-----+--------+----------+--------+ RIGHT     PSV cm/sRatioStenosisWaveform  Comments +----------+--------+-----+--------+----------+--------+ CFA Mid   103                  triphasic          +----------+--------+-----+--------+----------+--------+ DFA       112                  triphasic          +----------+--------+-----+--------+----------+--------+ SFA Prox  105                  triphasic          +----------+--------+-----+--------+----------+--------+ SFA Mid   81                   triphasic          +----------+--------+-----+--------+----------+--------+ SFA Distal61                   triphasic          +----------+--------+-----+--------+----------+--------+ POP Prox  55                   triphasic          +----------+--------+-----+--------+----------+--------+ POP Mid   101  triphasic          +----------+--------+-----+--------+----------+--------+ POP Distal31                   triphasic          +----------+--------+-----+--------+----------+--------+ TP Trunk  19                   monophasic         +----------+--------+-----+--------+----------+--------+ ATA Mid   65                   monophasic         +----------+--------+-----+--------+----------+--------+ PTA Mid   32                                      +----------+--------+-----+--------+----------+--------+ PERO Mid  47                   monophasic         +----------+--------+-----+--------+----------+--------+ Multiple heterogeneous areas in the  right groin that appear to be vascularized.  +----------+--------+-----+--------+---------+-------------------+ LEFT      PSV cm/sRatioStenosisWaveform Comments            +----------+--------+-----+--------+---------+-------------------+ CFA Mid   95                   triphasic                    +----------+--------+-----+--------+---------+-------------------+ DFA       75                   triphasic                    +----------+--------+-----+--------+---------+-------------------+ SFA Prox  98                   triphasic                    +----------+--------+-----+--------+---------+-------------------+ SFA Mid   93                   triphasic                    +----------+--------+-----+--------+---------+-------------------+ SFA Distal75                   biphasic                     +----------+--------+-----+--------+---------+-------------------+ POP Prox  107                  biphasic                     +----------+--------+-----+--------+---------+-------------------+ POP Mid   48                   biphasic                     +----------+--------+-----+--------+---------+-------------------+ POP Distal41                   biphasic                     +----------+--------+-----+--------+---------+-------------------+ TP Trunk  46                   biphasic                     +----------+--------+-----+--------+---------+-------------------+  ATA Mid   53                   biphasic                     +----------+--------+-----+--------+---------+-------------------+ PTA Mid   46                   biphasic                     +----------+--------+-----+--------+---------+-------------------+ PERO Mid                                Unable to visualize +----------+--------+-----+--------+---------+-------------------+  Summary: Right: Monophasic waveforms in the TP trunk, ATA and peroneal artery. Left: Near  normal examination.  See table(s) above for measurements and observations. Vascular consult recommended. Electronically signed by Jenkins Rouge MD on 07/08/2020 at 3:44:08 PM.    Final    [place] EKG SITE RITE  Result Date: 07/26/2020 If Site Rite image not attached, placement could not be confirmed due to current cardiac rhythm.      Subjective: Pain is controlled, no complaints  Discharge Exam: Vitals:   07/26/20 1512 07/26/20 1803 07/26/20 2051 07/27/20 0522  BP: (!) 178/90 (!) 172/87 (!) 175/86 122/69  Pulse:  85 73 66  Resp:   17 16  Temp:   98.3 F (36.8 C) 98.5 F (36.9 C)  TempSrc:    Oral  SpO2:   94% 94%  Weight:      Height:        General: Pt is alert, awake, not in acute distress Cardiovascular: RRR, S1/S2 +, no rubs, no gallops Respiratory: CTA bilaterally, no wheezing, no rhonchi Abdominal: Soft, NT, ND, bowel sounds + Extremities: right foot is wrapped in dressings,  no cyanosis    The results of significant diagnostics from this hospitalization (including imaging, microbiology, ancillary and laboratory) are listed below for reference.     Microbiology: Recent Results (from the past 240 hour(s))  Resp Panel by RT-PCR (Flu A&B, Covid) Nasopharyngeal Swab     Status: None   Collection Time: 07/20/20  6:30 PM   Specimen: Nasopharyngeal Swab; Nasopharyngeal(NP) swabs in vial transport medium  Result Value Ref Range Status   SARS Coronavirus 2 by RT PCR NEGATIVE NEGATIVE Final    Comment: (NOTE) SARS-CoV-2 target nucleic acids are NOT DETECTED.  The SARS-CoV-2 RNA is generally detectable in upper respiratory specimens during the acute phase of infection. The lowest concentration of SARS-CoV-2 viral copies this assay can detect is 138 copies/mL. A negative result does not preclude SARS-Cov-2 infection and should not be used as the sole basis for treatment or other patient management decisions. A negative result may occur with  improper specimen  collection/handling, submission of specimen other than nasopharyngeal swab, presence of viral mutation(s) within the areas targeted by this assay, and inadequate number of viral copies(<138 copies/mL). A negative result must be combined with clinical observations, patient history, and epidemiological information. The expected result is Negative.  Fact Sheet for Patients:  EntrepreneurPulse.com.au  Fact Sheet for Healthcare Providers:  IncredibleEmployment.be  This test is no t yet approved or cleared by the [place] FDA and  has been authorized for detection and/or diagnosis of SARS-CoV-2 by FDA under an Emergency Use Authorization (EUA). This EUA will remain  in effect (meaning this test can be used) for the duration of the COVID-19  declaration under Section 564(b)(1) of the Act, 21 U.S.C.section 360bbb-3(b)(1), unless the authorization is terminated  or revoked sooner.       Influenza A by PCR NEGATIVE NEGATIVE Final   Influenza B by PCR NEGATIVE NEGATIVE Final    Comment: (NOTE) The Xpert Xpress SARS-CoV-2/FLU/RSV plus assay is intended as an aid in the diagnosis of influenza from Nasopharyngeal swab specimens and should not be used as a sole basis for treatment. Nasal washings and aspirates are unacceptable for Xpert Xpress SARS-CoV-2/FLU/RSV testing.  Fact Sheet for Patients: EntrepreneurPulse.com.au  Fact Sheet for Healthcare Providers: IncredibleEmployment.be  This test is not yet approved or cleared by the [place] FDA and has been authorized for detection and/or diagnosis of SARS-CoV-2 by FDA under an Emergency Use Authorization (EUA). This EUA will remain in effect (meaning this test can be used) for the duration of the COVID-19 declaration under Section 564(b)(1) of the Act, 21 U.S.C. section 360bbb-3(b)(1), unless the authorization is terminated or revoked.  Performed at [facility], [place] [address], [place], [place] [address]   Blood culture (routine x 2)     Status: None   Collection Time: 07/20/20  8:42 PM   Specimen: BLOOD  Result Value Ref Range Status   Specimen Description BLOOD SITE NOT SPECIFIED  Final   Special Requests   Final    BOTTLES DRAWN AEROBIC AND ANAEROBIC Blood Culture adequate volume   Culture   Final    NO GROWTH 5 DAYS Performed at [facility], [address] [place], [place] [address]    Report Status 07/25/2020 FINAL  Final  Blood culture (routine x 2)     Status: None   Collection Time: 07/20/20  9:13 PM   Specimen: BLOOD  Result Value Ref Range Status   Specimen Description BLOOD SITE NOT SPECIFIED  Final   Special Requests   Final    BOTTLES DRAWN AEROBIC AND ANAEROBIC Blood Culture adequate volume   Culture   Final    NO GROWTH 5 DAYS Performed at [facility], [place] [address], [place], [place] [address]    Report Status 07/25/2020 FINAL  Final  Surgical pcr screen     Status: None   Collection Time: 07/21/20  4:50 PM   Specimen: Nasal Mucosa; Nasal Swab  Result Value Ref Range Status   MRSA, PCR NEGATIVE NEGATIVE Final   Staphylococcus aureus NEGATIVE NEGATIVE Final    Comment: (NOTE) The Xpert SA Assay (FDA approved for NASAL specimens in patients 28 years of age and older), is one component of a comprehensive surveillance program. It is not intended to diagnose infection nor to guide or monitor treatment. Performed at [facility], [place] [address] [place], [place] [address]   Fungus Culture With Stain     Status: None (Preliminary result)   Collection Time: 07/21/20  9:49 PM   Specimen: Wound; Tissue  Result Value Ref Range Status   Fungus Stain Final report  Final    Comment: (NOTE) Performed At: [facility] [place], [place] [id number] Rush Farmer MD JK:[id number]    Fungus (Mycology) Culture PENDING  Incomplete   Fungal Source TOE  Final    Comment:  RIGHT GREAT Performed at [facility], [place] [address], [place], [place] [address]   Aerobic/Anaerobic Culture w Gram Stain (surgical/deep wound)     Status: None   Collection Time: 07/21/20  9:49 PM   Specimen: Wound; Tissue  Result Value Ref Range Status   Specimen Description WOUND  Final   Special Requests RIGHT GREAT TOE  Final   Gram Stain   Final    MODERATE WBC PRESENT, PREDOMINANTLY PMN ABUNDANT GRAM POSITIVE COCCI ABUNDANT GRAM NEGATIVE RODS RARE GRAM VARIABLE ROD RESULT CALLED TO, READ BACK BY AND VERIFIED WITH: DR March Rummage 07/21/20 2303 JDW    Culture   Final    FEW ESCHERICHIA COLI FEW STREPTOCOCCUS AGALACTIAE TESTING AGAINST S. AGALACTIAE NOT ROUTINELY PERFORMED DUE TO PREDICTABILITY OF AMP/PEN/VAN SUSCEPTIBILITY. NO ANAEROBES ISOLATED Performed at [facility], [place] [address], [place], [place] [address]    Report Status 07/26/2020 FINAL  Final   Organism ID, Bacteria ESCHERICHIA COLI  Final      Susceptibility   Escherichia coli - MIC*    AMPICILLIN >=32 RESISTANT Resistant     CEFAZOLIN <=4 SENSITIVE Sensitive     CEFEPIME <=0.12 SENSITIVE Sensitive     CEFTAZIDIME <=1 SENSITIVE Sensitive     CEFTRIAXONE <=0.25 SENSITIVE Sensitive     CIPROFLOXACIN <=0.25 SENSITIVE Sensitive     GENTAMICIN <=1 SENSITIVE Sensitive     IMIPENEM <=0.25 SENSITIVE Sensitive     TRIMETH/SULFA >=320 RESISTANT Resistant     AMPICILLIN/SULBACTAM 8 SENSITIVE Sensitive     PIP/TAZO <=4 SENSITIVE Sensitive     * FEW ESCHERICHIA COLI  Fungus Culture Result     Status: None   Collection Time: 07/21/20  9:49 PM  Result Value Ref Range Status   Result 1 Comment  Final    Comment: (NOTE) KOH/Calcofluor preparation:  no fungus observed. Performed At: [facility] [place], [place] [id number] Rush Farmer MD [id number]   Fungus Culture With Stain     Status: None (Preliminary result)   Collection Time: 07/23/20 11:54 AM   Specimen: PATH Digit  amputation; Tissue  Result Value Ref Range Status   Fungus Stain Final report  Final    Comment: (NOTE) Performed At: [facility] [place], [place] [id number] Rush Farmer MD LK:[id number]    Fungus (Mycology) Culture PENDING  Incomplete   Fungal Source TISSUE  Final    Comment: Performed at [facility], [place] [address], [place], [place] [address]  Aerobic/Anaerobic Culture w Gram Stain (surgical/deep wound)     Status: None (Preliminary result)   Collection Time: 07/23/20 11:54 AM   Specimen: PATH Digit amputation; Tissue  Result Value Ref Range Status   Specimen Description TISSUE  Final   Special Requests BONE FIRST METATARSAL OF RIGHT FOOT SPEC A  Final   Gram Stain   Final    NO WBC SEEN NO ORGANISMS SEEN Performed at [facility], [address] [place], [place] [address]    Culture   Final    RARE ESCHERICHIA COLI RARE GROUP B STREP(S.AGALACTIAE)ISOLATED TESTING AGAINST S. AGALACTIAE NOT ROUTINELY PERFORMED DUE TO PREDICTABILITY OF AMP/PEN/VAN SUSCEPTIBILITY. NO ANAEROBES ISOLATED; CULTURE IN PROGRESS FOR 5 DAYS    Report Status PENDING  Incomplete   Organism ID, Bacteria ESCHERICHIA COLI  Final      Susceptibility   Escherichia coli - MIC*    AMPICILLIN >=32 RESISTANT Resistant     CEFAZOLIN <=4 SENSITIVE Sensitive     CEFEPIME <=0.12 SENSITIVE Sensitive     CEFTAZIDIME <=1 SENSITIVE Sensitive     CEFTRIAXONE <=0.25 SENSITIVE Sensitive     CIPROFLOXACIN <=0.25 SENSITIVE Sensitive     GENTAMICIN <=1 SENSITIVE Sensitive     IMIPENEM <=0.25  SENSITIVE Sensitive     TRIMETH/SULFA >=320 RESISTANT Resistant     AMPICILLIN/SULBACTAM 4 SENSITIVE Sensitive     PIP/TAZO <=4 SENSITIVE Sensitive     * RARE ESCHERICHIA COLI  Acid Fast Smear (AFB)     Status: None   Collection Time: 07/23/20 11:54 AM   Specimen: PATH Digit amputation; Tissue  Result Value Ref Range Status   AFB Specimen Processing Concentration  Final   Acid Fast Smear  Negative  Final    Comment: (NOTE) Performed At: [facility] [place], [place] [id number] Rush Farmer MD SE:[id number]    Source (AFB) TISSUE  Final    Comment: Performed at [facility], [place] [address], [place], [place] [address]  Fungus Culture Result     Status: None   Collection Time: 07/23/20 11:54 AM  Result Value Ref Range Status   Result 1 Comment  Final    Comment: (NOTE) KOH/Calcofluor preparation:  no fungus observed. Performed At: [facility] [place], [place] [id number] Rush Farmer MD EV:[id number]      Labs: BNP (last 3 results) No results for input(s): BNP in the last 8760 hours. Basic Metabolic Panel: Recent Labs  Lab 07/20/20 1839 07/21/20 0114 07/22/20 0306 07/24/20 0026 07/25/20 0719 07/27/20 0358  NA 133* 134* 132* 135 136  --   K 4.0 4.1 4.4 4.6 3.8  --   CL 96* 96* 97* 98 101  --   CO2 _0 --   GLUCOSE 180* 172* 184* 192* 138*  --   BUN 24* 23* 12 9 5*  --   CREATININE 1.63* 1.45* 1.13 1.21 1.06 1.17  CALCIUM 9.1 8.8* 8.3* 8.3* 8.5*  --    Liver Function Tests: Recent Labs  Lab 07/21/20 0114 07/22/20 0306  AST 20 19  ALT 16 17  ALKPHOS 70 62  BILITOT 1.0 0.7  PROT 7.6 6.9  ALBUMIN 2.4* 2.1*   No results for input(s): LIPASE, AMYLASE in the last 168 hours. No results for input(s): AMMONIA in the last 168 hours. CBC: Recent Labs  Lab 07/20/20 1839 07/21/20 0114 07/22/20 0644 07/23/20 0024 07/24/20 0026 07/25/20 0719  WBC 23.7* 19.9* 15.9* 15.2* 14.1* 10.8*  NEUTROABS 19.2*  --   --   --   --   --   HGB 11.7* 11.1* 11.1* 10.2* 9.8* 9.5*  HCT 36.8* 35.3* 35.5* 32.2* 32.0* 30.4*  MCV 89.5 91.0 89.6 89.9 91.7 90.2  PLT 373 370 350 347 354 365   Cardiac Enzymes: No results for input(s): CKTOTAL, CKMB, CKMBINDEX, TROPONINI in the last 168 hours. BNP: Invalid input(s): POCBNP CBG: Recent Labs  Lab 07/26/20 0807 07/26/20 1159 07/26/20 1700 07/26/20 2047  07/27/20 0824  GLUCAP 140* 150* 152* 168* 141*   D-Dimer No results for input(s): DDIMER in the last 72 hours. Hgb A1c No results for input(s): HGBA1C in the last 72 hours. Lipid Profile No results for input(s): CHOL, HDL, LDLCALC, TRIG, CHOLHDL, LDLDIRECT in the last 72 hours. Thyroid function studies No results for input(s): TSH, T4TOTAL, T3FREE, THYROIDAB in the last 72 hours.  Invalid input(s): FREET3 Anemia work up No results for input(s): VITAMINB12, FOLATE, FERRITIN, TIBC, IRON, RETICCTPCT in the last 72 hours. Urinalysis    Component Value Date/Time   COLORURINE STRAW (A) 05/11/2018 1844   APPEARANCEUR CLEAR 05/11/2018 1844   LABSPEC 1.013 05/11/2018 1844   PHURINE 5.0 05/11/2018 1844   GLUCOSEU >=500 (A) 05/11/2018 1844  HGBUR LARGE (A) 05/11/2018 1844   BILIRUBINUR NEGATIVE 05/11/2018 1844   KETONESUR NEGATIVE 05/11/2018 1844   PROTEINUR 30 (A) 05/11/2018 1844   NITRITE NEGATIVE 05/11/2018 1844   LEUKOCYTESUR NEGATIVE 05/11/2018 1844   Sepsis Labs Invalid input(s): PROCALCITONIN,  WBC,  LACTICIDVEN Microbiology Recent Results (from the past 240 hour(s))  Resp Panel by RT-PCR (Flu A&B, Covid) Nasopharyngeal Swab     Status: None   Collection Time: 07/20/20  6:30 PM   Specimen: Nasopharyngeal Swab; Nasopharyngeal(NP) swabs in vial transport medium  Result Value Ref Range Status   SARS Coronavirus 2 by RT PCR NEGATIVE NEGATIVE Final    Comment: (NOTE) SARS-CoV-2 target nucleic acids are NOT DETECTED.  The SARS-CoV-2 RNA is generally detectable in upper respiratory specimens during the acute phase of infection. The lowest concentration of SARS-CoV-2 viral copies this assay can detect is 138 copies/mL. A negative result does not preclude SARS-Cov-2 infection and should not be used as the sole basis for treatment or other patient management decisions. A negative result may occur with  improper specimen collection/handling, submission of specimen other than  nasopharyngeal swab, presence of viral mutation(s) within the areas targeted by this assay, and inadequate number of viral copies(<138 copies/mL). A negative result must be combined with clinical observations, patient history, and epidemiological information. The expected result is Negative.  Fact Sheet for Patients:  EntrepreneurPulse.com.au  Fact Sheet for Healthcare Providers:  IncredibleEmployment.be  This test is no t yet approved or cleared by the [place] FDA and  has been authorized for detection and/or diagnosis of SARS-CoV-2 by FDA under an Emergency Use Authorization (EUA). This EUA will remain  in effect (meaning this test can be used) for the duration of the COVID-19 declaration under Section 564(b)(1) of the Act, 21 U.S.C.section 360bbb-3(b)(1), unless the authorization is terminated  or revoked sooner.       Influenza A by PCR NEGATIVE NEGATIVE Final   Influenza B by PCR NEGATIVE NEGATIVE Final    Comment: (NOTE) The Xpert Xpress SARS-CoV-2/FLU/RSV plus assay is intended as an aid in the diagnosis of influenza from Nasopharyngeal swab specimens and should not be used as a sole basis for treatment. Nasal washings and aspirates are unacceptable for Xpert Xpress SARS-CoV-2/FLU/RSV testing.  Fact Sheet for Patients: EntrepreneurPulse.com.au  Fact Sheet for Healthcare Providers: IncredibleEmployment.be  This test is not yet approved or cleared by the [place] FDA and has been authorized for detection and/or diagnosis of SARS-CoV-2 by FDA under an Emergency Use Authorization (EUA). This EUA will remain in effect (meaning this test can be used) for the duration of the COVID-19 declaration under Section 564(b)(1) of the Act, 21 U.S.C. section 360bbb-3(b)(1), unless the authorization is terminated or revoked.  Performed at [facility], [place] [address], [place], [place] [address]    Blood culture (routine x 2)     Status: None   Collection Time: 07/20/20  8:42 PM   Specimen: BLOOD  Result Value Ref Range Status   Specimen Description BLOOD SITE NOT SPECIFIED  Final   Special Requests   Final    BOTTLES DRAWN AEROBIC AND ANAEROBIC Blood Culture adequate volume   Culture   Final    NO GROWTH 5 DAYS Performed at [facility], [address] [place], [place] [address]    Report Status 07/25/2020 FINAL  Final  Blood culture (routine x 2)     Status: None   Collection Time: 07/20/20  9:13 PM   Specimen: BLOOD  Result Value Ref Range Status   Specimen Description BLOOD SITE NOT SPECIFIED  Final   Special Requests   Final    BOTTLES DRAWN AEROBIC AND ANAEROBIC Blood Culture adequate volume   Culture   Final    NO GROWTH 5 DAYS Performed at [facility], [address], [place], [place] [address]    Report Status 07/25/2020 FINAL  Final  Surgical pcr screen     Status: None   Collection Time: 07/21/20  4:50 PM   Specimen: Nasal Mucosa; Nasal Swab  Result Value Ref Range Status   MRSA, PCR NEGATIVE NEGATIVE Final   Staphylococcus aureus NEGATIVE NEGATIVE Final    Comment: (NOTE) The Xpert SA Assay (FDA approved for NASAL specimens in patients 90 years of age and older), is one component of a comprehensive surveillance program. It is not intended to diagnose infection nor to guide or monitor treatment. Performed at [facility], [place] [address] [place], [place] [address]   Fungus Culture With Stain     Status: None (Preliminary result)   Collection Time: 07/21/20  9:49 PM   Specimen: Wound; Tissue  Result Value Ref Range Status   Fungus Stain Final report  Final    Comment: (NOTE) Performed At: [facility] [place], [place] [id number] Rush Farmer MD [id number]    Fungus (Mycology) Culture PENDING  Incomplete   Fungal Source TOE  Final    Comment: RIGHT GREAT Performed at [facility], [place]  [address], [place], [place] [address]   Aerobic/Anaerobic Culture w Gram Stain (surgical/deep wound)     Status: None   Collection Time: 07/21/20  9:49 PM   Specimen: Wound; Tissue  Result Value Ref Range Status   Specimen Description WOUND  Final   Special Requests RIGHT GREAT TOE  Final   Gram Stain   Final    MODERATE WBC PRESENT, PREDOMINANTLY PMN ABUNDANT GRAM POSITIVE COCCI ABUNDANT GRAM NEGATIVE RODS RARE GRAM VARIABLE ROD RESULT CALLED TO, READ BACK BY AND VERIFIED WITH: DR March Rummage 07/21/20 2303 JDW    Culture   Final    FEW ESCHERICHIA COLI FEW STREPTOCOCCUS AGALACTIAE TESTING AGAINST S. AGALACTIAE NOT ROUTINELY PERFORMED DUE TO PREDICTABILITY OF AMP/PEN/VAN SUSCEPTIBILITY. NO ANAEROBES ISOLATED Performed at [facility], [place] [address] [place], [place] [address]    Report Status 07/26/2020 FINAL  Final   Organism ID, Bacteria ESCHERICHIA COLI  Final      Susceptibility   Escherichia coli - MIC*    AMPICILLIN >=32 RESISTANT Resistant     CEFAZOLIN <=4 SENSITIVE Sensitive     CEFEPIME <=0.12 SENSITIVE Sensitive     CEFTAZIDIME <=1 SENSITIVE Sensitive     CEFTRIAXONE <=0.25 SENSITIVE Sensitive     CIPROFLOXACIN <=0.25 SENSITIVE Sensitive     GENTAMICIN <=1 SENSITIVE Sensitive     IMIPENEM <=0.25 SENSITIVE Sensitive     TRIMETH/SULFA >=320 RESISTANT Resistant     AMPICILLIN/SULBACTAM 8 SENSITIVE Sensitive     PIP/TAZO <=4 SENSITIVE Sensitive     * FEW ESCHERICHIA COLI  Fungus Culture Result     Status: None   Collection Time: 07/21/20  9:49 PM  Result Value Ref Range Status   Result 1 Comment  Final    Comment: (NOTE) KOH/Calcofluor preparation:  no fungus observed. Performed At: [facility] [place], [place] [id number] Rush Farmer MD [id number]   Fungus Culture With Stain     Status: None (Preliminary result)  Collection Time: 07/23/20 11:54 AM   Specimen: PATH Digit amputation; Tissue  Result Value Ref Range Status   Fungus Stain  Final report  Final    Comment: (NOTE) Performed At: [facility] [place], [place] [id number] Rush Farmer MD [id number]    Fungus (Mycology) Culture PENDING  Incomplete   Fungal Source TISSUE  Final    Comment: Performed at [facility], [place] [address], [place], [place] [address]  Aerobic/Anaerobic Culture w Gram Stain (surgical/deep wound)     Status: None (Preliminary result)   Collection Time: 07/23/20 11:54 AM   Specimen: PATH Digit amputation; Tissue  Result Value Ref Range Status   Specimen Description TISSUE  Final   Special Requests BONE FIRST METATARSAL OF RIGHT FOOT SPEC A  Final   Gram Stain   Final    NO WBC SEEN NO ORGANISMS SEEN Performed at [facility], [address], [place], [place] [address]    Culture   Final    RARE ESCHERICHIA COLI RARE GROUP B STREP(S.AGALACTIAE)ISOLATED TESTING AGAINST S. AGALACTIAE NOT ROUTINELY PERFORMED DUE TO PREDICTABILITY OF AMP/PEN/VAN SUSCEPTIBILITY. NO ANAEROBES ISOLATED; CULTURE IN PROGRESS FOR 5 DAYS    Report Status PENDING  Incomplete   Organism ID, Bacteria ESCHERICHIA COLI  Final      Susceptibility   Escherichia coli - MIC*    AMPICILLIN >=32 RESISTANT Resistant     CEFAZOLIN <=4 SENSITIVE Sensitive     CEFEPIME <=0.12 SENSITIVE Sensitive     CEFTAZIDIME <=1 SENSITIVE Sensitive     CEFTRIAXONE <=0.25 SENSITIVE Sensitive     CIPROFLOXACIN <=0.25 SENSITIVE Sensitive     GENTAMICIN <=1 SENSITIVE Sensitive     IMIPENEM <=0.25 SENSITIVE Sensitive     TRIMETH/SULFA >=320 RESISTANT Resistant     AMPICILLIN/SULBACTAM 4 SENSITIVE Sensitive     PIP/TAZO <=4 SENSITIVE Sensitive     * RARE ESCHERICHIA COLI  Acid Fast Smear (AFB)     Status: None   Collection Time: 07/23/20 11:54 AM   Specimen: PATH Digit amputation; Tissue  Result Value Ref Range Status   AFB Specimen Processing Concentration  Final   Acid Fast Smear Negative  Final    Comment: (NOTE) Performed At: [facility] [place], [place] [id number] Rush Farmer MD XT:[id number]    Source (AFB) TISSUE  Final    Comment: Performed at [facility], [place] [address], [place], [place] [address]  Fungus Culture Result     Status: None   Collection Time: 07/23/20 11:54 AM  Result Value Ref Range Status   Result 1 Comment  Final    Comment: (NOTE) KOH/Calcofluor preparation:  no fungus observed. Performed At: [facility] [place], [place] [id number] Rush Farmer MD [id number]:[id number]      Time coordinating discharge: 30mns  SIGNED:   JKathie Dike MD  [facility] 07/27/2020, 11:49 AM   If 7PM-7AM, please contact night-coverage [email or web address]

## 2020-07-27 NOTE — Progress Notes (Signed)
Discharge instructions given to patient, PICC and home health instructions provided to wife by home health, patient verbalizes understanding. Patient discharged

## 2020-07-27 NOTE — Evaluation (Signed)
Physical Therapy Evaluation Patient Details Name: Austin Shaw MRN: [id number] DOB: 26-Nov-1965 Today's Date: 07/27/2020   History of Present Illness  55 y.o. M admitted for s/p delayed wound closure right foot wound with partial metatarsal resection 1st metatarsal and sesamoidectomy. PMH includes diabetes, peripheral vascular disease, morbid obesity, hypertension, admitted to the hospital with right great toe osteomyelitis/gangrene. S/P great toe amputation.  Clinical Impression  Patient received in bed, just worked with OT prior to my arrival. Patient reports MD would like for him to get knee scooter to use at work. Patient is independent with bed mobility and transfers. Practiced with knee scooter in room and out to hallway. He is generally safe with this. Patient does not require PT follow up at this time and should be discharging home today.      Follow Up Recommendations No PT follow up    Equipment Recommendations  Other (comment) (knee scooter)    Recommendations for Other Services       Precautions / Restrictions Precautions Precautions: Fall Restrictions Weight Bearing Restrictions: No RLE Weight Bearing: Weight bearing as tolerated      Mobility  Bed Mobility Overal bed mobility: Independent             General bed mobility comments: HOB slightly elevated and used bed railings.    Transfers Overall transfer level: Independent Equipment used: None             General transfer comment: Pt able to transfer from bed to bathroom to toilet with no assist. Balance in tact, no safety concerns.  Ambulation/Gait   Gait Distance (Feet): 20 Feet Assistive device: None Gait Pattern/deviations: Step-through pattern;Decreased stance time - right Gait velocity: decr   General Gait Details: generally safe with mobility, used knee scooter in room and out to hallway to practice. No concerns with this.  Stairs            Wheelchair Mobility    Modified  Rankin (Stroke Patients Only)       Balance Overall balance assessment: Independent                                           Pertinent Vitals/Pain Pain Assessment: 0-10 Pain Score: 6  Pain Location: R foot Pain Descriptors / Indicators: Constant;Aching Pain Intervention(s): Monitored during session    Home Living Family/patient expects to be discharged to:: Private residence Living Arrangements: Spouse/significant other Available Help at Discharge: Family;Available PRN/intermittently Type of Home: House Home Access: Level entry     Home Layout: One level Home Equipment: None      Prior Function Level of Independence: Independent               Hand Dominance   Dominant Hand: Right    Extremity/Trunk Assessment   Upper Extremity Assessment Upper Extremity Assessment: Defer to OT evaluation    Lower Extremity Assessment Lower Extremity Assessment: RLE deficits/detail RLE Deficits / Details: pain in right foot, otherwise WNL    Cervical / Trunk Assessment Cervical / Trunk Assessment: Normal  Communication   Communication: No difficulties  Cognition Arousal/Alertness: Awake/alert Behavior During Therapy: WFL for tasks assessed/performed Overall Cognitive Status: Within Functional Limits for tasks assessed  General Comments General comments (skin integrity, edema, etc.): VSS on RA, Pt reported R foot feeling like it is swelling further.    Exercises     Assessment/Plan    PT Assessment Patent does not need any further PT services  PT Problem List         PT Treatment Interventions      PT Goals (Current goals can be found in the Care Plan section)  Acute Rehab PT Goals Patient Stated Goal: To go back to work PT Goal Formulation: With patient Time For Goal Achievement: 08/03/20 Potential to Achieve Goals: Good    Frequency     Barriers to discharge         Co-evaluation               AM-PAC PT "6 Clicks" Mobility  Outcome Measure Help needed turning from your back to your side while in a flat bed without using bedrails?: None Help needed moving from lying on your back to sitting on the side of a flat bed without using bedrails?: None Help needed moving to and from a bed to a chair (including a wheelchair)?: None Help needed standing up from a chair using your arms (e.g., wheelchair or bedside chair)?: None Help needed to walk in hospital room?: None Help needed climbing 3-5 steps with a railing? : None 6 Click Score: 24    End of Session   Activity Tolerance: Patient tolerated treatment well Patient left: with family/visitor present Nurse Communication: Mobility status PT Visit Diagnosis: Pain;Difficulty in walking, not elsewhere classified (R26.2) Pain - Right/Left: Right    Time: 1030-1041 PT Time Calculation (min) (ACUTE ONLY): 11 min   Charges:   PT Evaluation $PT Eval Low Complexity: 1 Low          Enaya Howze, PT, GCS 07/27/20,11:03 AM

## 2020-07-27 NOTE — TOC Transition Note (Signed)
Transition of Care [facility]) - CM/SW Discharge Note   Patient Details  Name: Austin Shaw MRN: [id number] Date of Birth: 1965-09-25  [facility]) CM/SW Contact:  Bess Kinds, RN Phone Number: [phone] 07/27/2020, 10:36 AM   Clinical Narrative:     Follow up for transition of care from previous CM. Patient to transition home today. Verified arrangements with [facility] for IV antibiotics and [facility] for infusion RN. Pam with [facility] to do teaching at bedside between 11 and 12. No further TOC needs identified at this time.   Final next level of care: Home w Home Health Services Barriers to Discharge: No Barriers Identified   Patient Goals and CMS Choice     Choice offered to / list presented to : Patient  Discharge Placement                       Discharge Plan and Services   Discharge Planning Services: CM Consult            DME Arranged: Other see comment (IV infusion therapy/ [facility]) DME Agency: Other - Comment ([facility]) Date DME Agency Contacted: 07/26/20 Time DME Agency Contacted: [phone] Representative spoke with at DME Agency: Pam HH Arranged: IV Antibiotics HH Agency: [facility] Date HH Agency Contacted: 07/27/20 Time HH Agency Contacted: 1035 Representative spoke with at [facility] Agency: Pam  Social Determinants of Health (SDOH) Interventions     Readmission Risk Interventions No flowsheet data found.

## 2020-07-27 NOTE — Evaluation (Signed)
Occupational Therapy Evaluation Patient Details Name: Austin Shaw MRN: [id number] DOB: September 24, 1965 Today's Date: 07/27/2020    History of Present Illness 55 y.o. M admitted for s/p delayed wound closure right foot wound with partial metatarsal resection 1st metatarsal and sesamoidectomy. PMH includes diabetes, peripheral vascular disease, morbid obesity, hypertension, admitted to the hospital with right great toe osteomyelitis/gangrene.   Clinical Impression   Pt admitted to the ED for the procedure and concerns listed above. PTA pt reported being independent in all ADL's and IADL's. Pt works 40+ hours a week and drives. At the time of the evaluation, pt demonstrated continued independence with all self care ADL's. OT recommending shower chair to assist with bathing while continueing wound care management of keeping his RLE dry. Pt does not need further OT services and will be discharged from acute OT.     Follow Up Recommendations  No OT follow up    Equipment Recommendations  Tub/shower seat    Recommendations for Other Services       Precautions / Restrictions Precautions Precautions: Fall Restrictions Weight Bearing Restrictions: No RLE Weight Bearing: Weight bearing as tolerated      Mobility Bed Mobility Overal bed mobility: Modified Independent             General bed mobility comments: HOB slightly elevated and used bed railings.    Transfers Overall transfer level: Independent Equipment used: None             General transfer comment: Pt able to transfer from bed to bathroom to toilet with no assist. Balance in tact, no safety concerns.    Balance Overall balance assessment: Independent                                         ADL either performed or assessed with clinical judgement   ADL Overall ADL's : Modified independent;At baseline                                       General ADL Comments: Pt functional  mobility is limited until wound on R foot is healed. PT training pt on knee scooter, and OT educated on techniques to make bathing and dressing easier, as well as wound management of keeping his foot elevated when he isn't ambulating and keeping the wound area clean and dry.     Vision Baseline Vision/History: No visual deficits Patient Visual Report: No change from baseline Vision Assessment?: No apparent visual deficits     Perception Perception Perception Tested?: No   Praxis Praxis Praxis tested?: Not tested    Pertinent Vitals/Pain Pain Assessment: 0-10 Pain Score: 6  Pain Location: R foot Pain Descriptors / Indicators: Constant;Aching Pain Intervention(s): Monitored during session     Hand Dominance Right   Extremity/Trunk Assessment Upper Extremity Assessment Upper Extremity Assessment: Overall WFL for tasks assessed   Lower Extremity Assessment Lower Extremity Assessment: Defer to PT evaluation   Cervical / Trunk Assessment Cervical / Trunk Assessment: Normal   Communication Communication Communication: No difficulties   Cognition Arousal/Alertness: Awake/alert Behavior During Therapy: WFL for tasks assessed/performed Overall Cognitive Status: Within Functional Limits for tasks assessed  General Comments  VSS on RA, Pt reported R foot feeling like it is swelling further.    Exercises     Shoulder Instructions      Home Living Family/patient expects to be discharged to:: Private residence Living Arrangements: Spouse/significant other Available Help at Discharge: Family;Available PRN/intermittently Type of Home: House Home Access: Level entry     Home Layout: One level     Bathroom Shower/Tub: Producer, television/film/video: Standard Bathroom Accessibility: Yes How Accessible: Accessible via walker Home Equipment: None          Prior Functioning/Environment Level of Independence:  Independent                 OT Problem List:        OT Treatment/Interventions:      OT Goals(Current goals can be found in the care plan section) Acute Rehab OT Goals Patient Stated Goal: To go back to work OT Goal Formulation: With patient/family  OT Frequency:     Barriers to D/C:            Co-evaluation              AM-PAC OT "6 Clicks" Daily Activity     Outcome Measure Help from another person eating meals?: None Help from another person taking care of personal grooming?: None Help from another person toileting, which includes using toliet, bedpan, or urinal?: None Help from another person bathing (including washing, rinsing, drying)?: None Help from another person to put on and taking off regular upper body clothing?: None Help from another person to put on and taking off regular lower body clothing?: None 6 Click Score: 24   End of Session Nurse Communication: Mobility status  Activity Tolerance: Patient tolerated treatment well Patient left: in bed;with call bell/phone within reach;Other (comment) (PT in room)  OT Visit Diagnosis: Muscle weakness (generalized) (M62.81);Other abnormalities of gait and mobility (R26.89)                Time: 3009-2330 OT Time Calculation (min): 21 min Charges:  OT General Charges $OT Visit: 1 Visit OT Evaluation $OT Eval Low Complexity: 1 Low  Austin Shaw H., OTR/L Acute Rehabilitation  Austin Shaw Austin Shaw 07/27/2020, 10:52 AM

## 2020-07-28 ENCOUNTER — Telehealth: Payer: Self-pay

## 2020-07-28 LAB — AEROBIC/ANAEROBIC CULTURE W GRAM STAIN (SURGICAL/DEEP WOUND): Gram Stain: NONE SEEN

## 2020-07-28 MED ORDER — FREESTYLE LIBRE 14 DAY READER DEVI: 1 refills | Status: DC

## 2020-07-28 MED ORDER — FREESTYLE LIBRE 14 DAY SENSOR MISC: 12 refills | Status: DC

## 2020-07-28 NOTE — Telephone Encounter (Signed)
Erxs sent to pharmacy for freestyle [place] device and sensors.

## 2020-07-28 NOTE — Telephone Encounter (Signed)
-----   Message from Joaquim Nam, MD sent at 07/21/2020 10:19 PM EDT ----- Can you try to send this in later on?  Can you try next week?  He is currently in the hospital.  Thanks. ----- Message ----- From: Wilburn Cornelia, CMA Sent: 07/20/2020   2:04 PM EDT To: Joaquim Nam, MD  It looks like his insurance should cover this but its really unsure until we send it in. It said it may have some restrictions and was unknown. ----- Message ----- From: Joaquim Nam, MD Sent: 07/18/2020  10:49 PM EDT To: Wilburn Cornelia, CMA  Will patient qualify for freestyle libre CGM?  Please check on this for me.

## 2020-07-29 ENCOUNTER — Other Ambulatory Visit: Payer: Self-pay

## 2020-07-29 ENCOUNTER — Ambulatory Visit (INDEPENDENT_AMBULATORY_CARE_PROVIDER_SITE_OTHER): Payer: BC Managed Care – PPO | Admitting: Podiatry

## 2020-07-29 ENCOUNTER — Encounter: Payer: Self-pay | Admitting: Podiatry

## 2020-07-29 ENCOUNTER — Ambulatory Visit: Payer: BC Managed Care – PPO | Admitting: Nurse Practitioner

## 2020-07-29 DIAGNOSIS — L97513 Non-pressure chronic ulcer of other part of right foot with necrosis of muscle: Secondary | ICD-10-CM

## 2020-07-29 DIAGNOSIS — I96 Gangrene, not elsewhere classified: Secondary | ICD-10-CM

## 2020-07-29 NOTE — Progress Notes (Signed)
Subjective:   Patient ID: Austin Shaw, male   DOB: 55 y.o.   MRN: [id number]   HPI Patient presents with caregiver stating that he started to bleed after having had surgery with Dr. Samuella Cota last Wednesday and they were just worried and concerned and wanted to be checked   ROS      Objective:  Physical Exam  Neurovascular status appears to be intact negative Denna Haggard' sign was noted incision is doing well overall with the midportion of the incision having a small amount of bleeding but no dehiscence no indications of infective process.  It is localized to about 8 mm in length 5 mm in width and does have fresh blood with no other type of drainage noted     Assessment:  Appears to be a small area of active bleeding with patient on both aspirin and Plavix     Plan:  We are going to stop the aspirin and continue Plavix and I applied sterile dressing putting amount of small amount of Iodosorb after clotting the blood.  The bleeding completely stopped and it should take care of this problem and patient will see Dr. Samuella Cota next week with strict instructions that if there should be any changes he is to let us know immediately

## 2020-07-30 ENCOUNTER — Encounter: Payer: BC Managed Care – PPO | Admitting: Podiatry

## 2020-07-31 NOTE — Progress Notes (Signed)
Received email today that was sent yesterday concerning patient's need for bariatric knee scooter and tub bench. TOC had been notified by PT of need for knee scooter, but was not advised of bariatric status.  Reached out to [employer] who provided knee scooter on Tuesday to request knee scooter be swapped. Advised of DME order placed for shower stool; however, TOC was not advised of shower stool need at the time. [employer] to follow up, likely on Monday, to get this gentleman what he needs.

## 2020-08-02 ENCOUNTER — Encounter: Payer: Self-pay | Admitting: Podiatry

## 2020-08-02 NOTE — Progress Notes (Signed)
Received email today from Austin Shaw with [facility] concerning follow up about knee scooter and shower stool. Spoke with wife, Austin Shaw, on the phone. She was told by [employer] that bariatric knee scooters were not available. Also discussed that shower stool order was not communicated and, therefore, not requested for delivery, but would follow up with [employer] as well.   Spoke with hospital liaison for [employer] concerning knee scooter that had been delivered last Tuesday. Advised that knee scooter would not support patient's weight. [employer] to follow up with patient and wife.   Discussed order placed last week for shower stool. [employer] to follow up.   Multiple phone calls to area providers to inquire about bariatric knee scooters. No local providers found. Noted scooters available on [employer] sites. Spoke with wife to advise of inability to find local provider for bariatric knee scooter.   Received follow up call from [facility] [phone] in response to voicemail. They have a standard knee scooter that goes up to 400 pounds, however, seat is smaller and may not be comfortable. Spoke with Austin Shaw on the phone again to advise of what [employer] had said. Contact information provided. Austin Shaw to follow up. Austin Shaw expressed appreciation for efforts to assist with getting her husband with what he needs.

## 2020-08-03 ENCOUNTER — Other Ambulatory Visit: Payer: Self-pay

## 2020-08-03 ENCOUNTER — Ambulatory Visit (INDEPENDENT_AMBULATORY_CARE_PROVIDER_SITE_OTHER): Payer: BC Managed Care – PPO | Admitting: Podiatry

## 2020-08-03 DIAGNOSIS — L97513 Non-pressure chronic ulcer of other part of right foot with necrosis of muscle: Secondary | ICD-10-CM

## 2020-08-03 DIAGNOSIS — I96 Gangrene, not elsewhere classified: Secondary | ICD-10-CM

## 2020-08-03 DIAGNOSIS — Z9889 Other specified postprocedural states: Secondary | ICD-10-CM

## 2020-08-03 MED ORDER — OXYCODONE-ACETAMINOPHEN 5-325 MG PO TABS: 1.0000 | ORAL_TABLET | Freq: Four times a day (QID) | ORAL | 0 refills | Status: DC | PRN

## 2020-08-03 NOTE — Progress Notes (Signed)
  Subjective:  Patient ID: Austin Shaw, male    DOB: 01/14/1966,  MRN: [id number]  Chief Complaint  Patient presents with  . Post-op Problem    Ulcer of right foot. Pt has no concerns.    DOS: 07/21/20 Procedure: Right foot 1st toe amputation  DOS: 07/23/20 Procedure: Right foot delayed wound closure with partial metatarsal resection  55 y.o. male presents with the above complaint. History confirmed with patient. Doing well did have some bleeding issues but denies other problems.  Objective:  Physical Exam: no tenderness at the surgical site, local edema noted and calf supple, nontender. Incision: no significant erythema, slow healing, slight clear drainage present    Assessment:   1. Gangrene (HCC)   2. Ulcer of great toe, right, with necrosis of muscle (HCC)   3. Post-operative state     Plan:  Patient was evaluated and treated and all questions answered.  Post-operative State -Dressing applied consisting of sterile gauze, kerlix and ACE bandage -WBAT in Surgical shoe -Pain medication refilled  Return in about 1 week (around 08/10/2020).

## 2020-08-04 ENCOUNTER — Ambulatory Visit: Payer: BC Managed Care – PPO | Admitting: Podiatry

## 2020-08-10 ENCOUNTER — Other Ambulatory Visit: Payer: Self-pay

## 2020-08-10 ENCOUNTER — Ambulatory Visit (INDEPENDENT_AMBULATORY_CARE_PROVIDER_SITE_OTHER): Payer: BC Managed Care – PPO | Admitting: Podiatry

## 2020-08-10 DIAGNOSIS — Z9889 Other specified postprocedural states: Secondary | ICD-10-CM

## 2020-08-10 DIAGNOSIS — L97513 Non-pressure chronic ulcer of other part of right foot with necrosis of muscle: Secondary | ICD-10-CM

## 2020-08-10 DIAGNOSIS — I96 Gangrene, not elsewhere classified: Secondary | ICD-10-CM

## 2020-08-10 LAB — FUNGUS CULTURE WITH STAIN

## 2020-08-10 LAB — FUNGAL ORGANISM REFLEX

## 2020-08-10 LAB — FUNGUS CULTURE RESULT

## 2020-08-10 MED ORDER — OXYCODONE-ACETAMINOPHEN 5-325 MG PO TABS: 1.0000 | ORAL_TABLET | Freq: Four times a day (QID) | ORAL | 0 refills | Status: DC | PRN

## 2020-08-10 NOTE — Progress Notes (Signed)
  Subjective:  Patient ID: Austin Shaw, male    DOB: [birth year],  MRN: [id number]  Chief Complaint  Patient presents with  . Routine Post Op    Denies any signs of infection. Reports darkness in skin. Manageable Pain levels.    DOS: 07/21/20 Procedure: Right foot 1st toe amputation  DOS: 07/23/20 Procedure: Right foot delayed wound closure with partial metatarsal resection  55 y.o. male presents with the above complaint. History confirmed with patient.   Objective:  Physical Exam: no tenderness at the surgical site, local edema noted and calf supple, nontender. Incision: no significant erythema, slow healing, slight clear drainage present. No full dehiscence but centrally some areas of fibrosis.    Assessment:   1. Gangrene (HCC)   2. Ulcer of great toe, right, with necrosis of muscle (HCC)   3. Post-operative state     Plan:  Patient was evaluated and treated and all questions answered.  Post-operative State -Wound overall healing well with only small areas of delayed healing -Dressing applied consisting of sterile gauze, kerlix and ACE bandage -WBAT in Surgical shoe -Pain medication refilled  Return in about 1 week (around 08/17/2020) for Post-Op (No XRs).

## 2020-08-13 NOTE — Telephone Encounter (Signed)
Error

## 2020-08-17 ENCOUNTER — Other Ambulatory Visit: Payer: Self-pay

## 2020-08-17 ENCOUNTER — Ambulatory Visit (INDEPENDENT_AMBULATORY_CARE_PROVIDER_SITE_OTHER): Payer: BC Managed Care – PPO | Admitting: Podiatry

## 2020-08-17 DIAGNOSIS — L97513 Non-pressure chronic ulcer of other part of right foot with necrosis of muscle: Secondary | ICD-10-CM

## 2020-08-17 DIAGNOSIS — Z9889 Other specified postprocedural states: Secondary | ICD-10-CM

## 2020-08-17 DIAGNOSIS — I96 Gangrene, not elsewhere classified: Secondary | ICD-10-CM

## 2020-08-17 NOTE — Progress Notes (Signed)
  Subjective:  Patient ID: Leyton Magoon, male    DOB: Jan 14, 1966,  MRN: [id number]  Chief Complaint  Patient presents with  . Routine Post Op      POV NO XRAYS 1wk fu surgical site draining, status post right great toe amputation   DOS: 07/21/20 Procedure: Right foot 1st toe amputation  DOS: 07/23/20 Procedure: Right foot delayed wound closure with partial metatarsal resection  55 y.o. male presents with the above complaint. History confirmed with patient.   Objective:  Physical Exam: no tenderness at the surgical site, local edema noted and calf supple, nontender. Incision: no significant erythema, slow healing, slight clear drainage present. No full dehiscence but central delayed area measuring 2x1 Assessment:   1. Ulcer of great toe, right, with necrosis of muscle (HCC)   2. Gangrene (HCC)   3. Post-operative state     Plan:  Patient was evaluated and treated and all questions answered.  Post-operative State -Partial staple removal today -Dressing applied consisting of prisma, sterile gauze, kerlix and ACE bandage -WBAT in Surgical shoe  -F/u in 2 weeks for further staple and suture removal -Medihoney and dressing daily for optimal moisture balance.  Return in about 2 weeks (around 08/31/2020) for Post-Op (No XRs).

## 2020-08-18 ENCOUNTER — Encounter: Payer: Self-pay | Admitting: Podiatry

## 2020-08-18 ENCOUNTER — Other Ambulatory Visit: Payer: Self-pay | Admitting: Family Medicine

## 2020-08-18 NOTE — Telephone Encounter (Addendum)
Refill request Last  Office visit 07/16/20 FYI These were discontinued at discharge 07/27/20

## 2020-08-19 NOTE — Telephone Encounter (Signed)
Dosages of both were increased as inpatient.  I updated the rx's.  See new strengths on the rxs.  Needs f/u and labs scheduled here when possible next week.  Thanks.

## 2020-08-23 ENCOUNTER — Telehealth: Payer: Self-pay | Admitting: *Deleted

## 2020-08-23 NOTE — Telephone Encounter (Signed)
Patient is requesting a refill of pain medicine(oxycodone-ace 5-325 mg).being denied by pharmacy.Please advise

## 2020-08-24 ENCOUNTER — Telehealth: Payer: Self-pay

## 2020-08-24 LAB — FUNGAL ORGANISM REFLEX

## 2020-08-24 LAB — FUNGUS CULTURE WITH STAIN

## 2020-08-24 LAB — FUNGUS CULTURE RESULT

## 2020-08-24 MED ORDER — OXYCODONE-ACETAMINOPHEN 5-325 MG PO TABS: 1.0000 | ORAL_TABLET | Freq: Four times a day (QID) | ORAL | 0 refills | Status: DC | PRN

## 2020-08-24 NOTE — Telephone Encounter (Signed)
PA for Oxycodone-Acetaminophen initiated on 08/24/20 @ 11:57am. Waiting for approval or denial from [employer]. Will take up to 24 hours for decision.  CoverMyMeds Key: Maurine Simmering

## 2020-08-24 NOTE — Telephone Encounter (Signed)
Patient notified new rxs sent and appt scheduled for 09/02/20 at 4pm.

## 2020-08-25 ENCOUNTER — Telehealth: Payer: Self-pay | Admitting: *Deleted

## 2020-08-25 NOTE — Telephone Encounter (Signed)
Returned call to patient no answer and could not leave vmessage. Will try call again later.

## 2020-08-26 ENCOUNTER — Encounter: Payer: Self-pay | Admitting: Infectious Disease

## 2020-08-26 ENCOUNTER — Other Ambulatory Visit: Payer: Self-pay

## 2020-08-26 ENCOUNTER — Ambulatory Visit (INDEPENDENT_AMBULATORY_CARE_PROVIDER_SITE_OTHER): Payer: BC Managed Care – PPO | Admitting: Infectious Disease

## 2020-08-26 ENCOUNTER — Telehealth: Payer: Self-pay

## 2020-08-26 VITALS — BP 191/105 | HR 80 | Temp 98.3°F | Wt 342.0 lb

## 2020-08-26 DIAGNOSIS — M86179 Other acute osteomyelitis, unspecified ankle and foot: Secondary | ICD-10-CM | POA: Diagnosis not present

## 2020-08-26 DIAGNOSIS — I739 Peripheral vascular disease, unspecified: Secondary | ICD-10-CM | POA: Diagnosis not present

## 2020-08-26 DIAGNOSIS — IMO0002 Reserved for concepts with insufficient information to code with codable children: Secondary | ICD-10-CM

## 2020-08-26 DIAGNOSIS — I429 Cardiomyopathy, unspecified: Secondary | ICD-10-CM | POA: Diagnosis not present

## 2020-08-26 DIAGNOSIS — E118 Type 2 diabetes mellitus with unspecified complications: Secondary | ICD-10-CM

## 2020-08-26 DIAGNOSIS — I96 Gangrene, not elsewhere classified: Secondary | ICD-10-CM | POA: Diagnosis not present

## 2020-08-26 DIAGNOSIS — E1165 Type 2 diabetes mellitus with hyperglycemia: Secondary | ICD-10-CM

## 2020-08-26 HISTORY — DX: Other acute osteomyelitis, unspecified ankle and foot: M86.179

## 2020-08-26 MED ORDER — AMOXICILLIN-POT CLAVULANATE 875-125 MG PO TABS: 1.0000 | ORAL_TABLET | Freq: Two times a day (BID) | ORAL | 1 refills | Status: DC

## 2020-08-26 NOTE — Progress Notes (Signed)
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   MRN: [id number]  HPI   55 y.o. male with these mellitus peripheral vascular disease status post revascularization with gangrenous toe with osteomyelitis status post I&D of bursal collection and amputation of first toe then repeat trip to the OR for debridement and resection with cultures from both surgeries yielding group B streptococcus and E. coli that was fairly sensitive.  We  placed him on IV cefazolin along with oral metronidazole.  He has been taking both medications religiously including the metronidazole which she tries to take as quickly as possible to avoid the unpleasant taste gives and ones mouth.  He has been seen by podiatry in follow-up and staples have been removed and the wound seems to be doing relatively well.  He and his wife had quite a bit of questions about how we would know when this infection was resolved.  I went over the things that we typically follow including pain which has improved in this case inflammatory markers which after review of his records from home health has not improved with his sedimentation rate having gone from 75 up to 100 on read this week.  We also certainly go by exam and how the wound appears to be doing.  Overall I am encouraged by the appearance of the wound but I am certainly discouraged by the route this inflammatory markers are going at present.  He does have follow-up next week with podiatry and I have scheduled him to come and see me again afterwards I am also sending in Augmentin for him to start taking once he completely pleats his parenteral antibiotics.  Certainly the obvious anxiety is if there is residual bone infection and whether he needs proximal more proximal amputation.  At present I am not in a rush to go get an MRI of his foot but certainly if his  inflammatory markers fail to go down with protracted IV followed by oral antibiotics then I would definitely get 1.  Past Medical History:  Diagnosis Date  . Allergy   . Diabetes mellitus    type 2  . Hyperlipidemia   . Hypertension   . Morbid obesity (HCC)    BMI 50  . Sleep apnea     Past Surgical History:  Procedure Laterality Date  . ABDOMINAL AORTOGRAM W/LOWER EXTREMITY N/A 07/14/2020   Procedure: ABDOMINAL AORTOGRAM W/LOWER EXTREMITY;  Surgeon: Wellington Hampshire, MD;  Location: [facility];  Service: Cardiovascular;  Laterality: N/A;  . AMPUTATION Right 07/21/2020   Procedure: AMPUTATION RIGHT GREAT TOE;  Surgeon: Evelina Bucy, DPM;  Location: [facility];  Service: Podiatry;  Laterality: Right;  . IRRIGATION AND DEBRIDEMENT FOOT Right 07/21/2020   Procedure: IRRIGATION AND DEBRIDEMENT RIGHT GREAT TOE;  Surgeon: Evelina Bucy, DPM;  Location: [facility];  Service: Podiatry;  Laterality: Right;  . METATARSAL HEAD EXCISION Right 07/23/2020   Procedure: METATARSAL HEAD EXCISION;  Surgeon: Evelina Bucy, DPM;  Location: [facility];  Service: Podiatry;  Laterality: Right;  . TONSILLECTOMY    . WOUND DEBRIDEMENT Right 07/23/2020   Procedure: DEBRIDEMENT WOUND WITH DELAYED WOUND CLOSURE;  Surgeon: Evelina Bucy, DPM;  Location: [facility];  Service: Podiatry;  Laterality: Right;    Family History  Problem Relation Age of Onset  . Diabetes Father   . Diabetes Other  fam hx 1st degree relative  . Congestive Heart Failure Other   . Lupus Daughter   . Sjogren's syndrome Daughter   . Colon cancer Neg Hx   . Prostate cancer Neg Hx   . Esophageal cancer Neg Hx   . Rectal cancer Neg Hx       Social History   Socioeconomic History  . Marital status: Married    Spouse name: Not on file  . Number of children: Not on file  . Years of education: Not on file  . Highest education level: Not on file  Occupational History  . Occupation: Games developer: [employer]  Tobacco  Use  . Smoking status: Never Smoker  . Smokeless tobacco: Never Used  Vaping Use  . Vaping Use: Never used  Substance and Sexual Activity  . Alcohol use: No    Alcohol/week: 0.0 standard drinks  . Drug use: No  . Sexual activity: Not on file  Other Topics Concern  . Not on file  Social History Narrative   Working at [employer].    4 kids   Married, [place]   Social Determinants of Health   Financial Resource Strain: Not on [employer] Insecurity: Not on file  Transportation Needs: Not on file  Physical Activity: Not on file  Stress: Not on file  Social Connections: Not on file    Allergies  Allergen Reactions  . Lipitor [Atorvastatin]     intolerant     Current Outpatient Medications:  .  aspirin EC 81 MG tablet, Take 1 tablet (81 mg total) by mouth daily. Swallow whole., Disp: , Rfl:  .  atorvastatin (LIPITOR) 40 MG tablet, Take 0.5 tablets (20 mg total) by mouth daily., Disp: , Rfl:  .  Blood Glucose Monitoring Suppl (ONE TOUCH ULTRA SYSTEM KIT) w/Device KIT, Check blood sugar twice a day and as directed. Dx E11.8, Disp: 1 each, Rfl: 0 .  carvedilol (COREG) 12.5 MG tablet, Take 1 tablet (12.5 mg total) by mouth 2 (two) times daily with a meal., Disp: 60 tablet, Rfl: 1 .  carvedilol (COREG) 12.5 MG tablet, Take 1 tablet (12.5 mg total) by mouth 2 (two) times daily with a meal., Disp: 180 tablet, Rfl: 0 .  ceFAZolin (ANCEF) IVPB, Inject 2 g into the vein every 8 (eight) hours. Indication:  Osteomyelitis First Dose: Yes Last Day of Therapy:  09/02/20 Labs - Once weekly:  CBC/D and BMP, Labs - Every other week:  ESR and CRP Method of administration: IV Push Method of administration may be changed at the discretion of home infusion pharmacist based upon assessment of the patient and/or caregiver's ability to self-administer the medication ordered., Disp: 114 Units, Rfl: 0 .  clopidogrel (PLAVIX) 75 MG tablet, Take 1 tablet (75 mg total) by mouth daily., Disp: 30  tablet, Rfl: 6 .  Continuous Blood Gluc Receiver (FREESTYLE LIBRE 14 DAY READER) DEVI, Use to check blood sugar daily. Dx : E11.9, Disp: 1 each, Rfl: 1 .  Continuous Blood Gluc Sensor (FREESTYLE LIBRE 14 DAY SENSOR) MISC, Use to check blood sugars daily. Dx: E11.9, Disp: 1 each, Rfl: 12 .  Dulaglutide (TRULICITY) 6.71 IW/5.8KD SOPN, Inject 0.75 mg into the skin once a week. Every Friday in the evening, Disp: , Rfl:  .  furosemide (LASIX) 40 MG tablet, Take 1 tablet (40 mg total) by mouth daily as needed for fluid., Disp: , Rfl:  .  glucose blood test strip, CHECK  BLOOD SUGAR TWICE DAILY, Disp: 100 each, Rfl: 3 .  hydrALAZINE (APRESOLINE) 25 MG tablet, Take 1 tablet (25 mg total) by mouth 3 (three) times daily., Disp: 90 tablet, Rfl: 1 .  losartan (COZAAR) 100 MG tablet, Take 1 tablet (100 mg total) by mouth daily., Disp: 90 tablet, Rfl: 0 .  losartan (COZAAR) 50 MG tablet, Take 2 tablets (100 mg total) by mouth daily., Disp: 30 tablet, Rfl: 1 .  metFORMIN (GLUCOPHAGE) 500 MG tablet, Take 500-1,000 mg by mouth See admin instructions. Take 1000 mg in the morning and 500 mg in the evening, Disp: , Rfl:  .  metroNIDAZOLE (FLAGYL) 500 MG tablet, Take 1 tablet (500 mg total) by mouth every 8 (eight) hours., Disp: 135 tablet, Rfl: 0 .  oxyCODONE-acetaminophen (PERCOCET/ROXICET) 5-325 MG tablet, Take 1-2 tablets by mouth every 6 (six) hours as needed for moderate pain., Disp: 30 tablet, Rfl: 0  Review of Systems  Constitutional: Negative for activity change, appetite change, chills, diaphoresis, fatigue, fever and unexpected weight change.  HENT: Negative for congestion, rhinorrhea, sinus pressure, sneezing, sore throat and trouble swallowing.   Eyes: Negative for photophobia and visual disturbance.  Respiratory: Negative for cough, chest tightness, shortness of breath, wheezing and stridor.   Cardiovascular: Negative for chest pain, palpitations and leg swelling.  Gastrointestinal: Negative for abdominal  distention, abdominal pain, anal bleeding, blood in stool, constipation, diarrhea, nausea and vomiting.  Genitourinary: Negative for difficulty urinating, dysuria, flank pain and hematuria.  Musculoskeletal: Negative for arthralgias, back pain, gait problem, joint swelling and myalgias.  Skin: Positive for wound. Negative for color change, pallor and rash.  Neurological: Negative for dizziness, tremors, weakness and light-headedness.  Hematological: Negative for adenopathy. Does not bruise/bleed easily.  Psychiatric/Behavioral: Negative for agitation, behavioral problems, confusion, decreased concentration, dysphoric mood and sleep disturbance.       Objective:   Physical Exam Constitutional:      Appearance: He is well-developed.  HENT:     Head: Normocephalic and atraumatic.  Eyes:     Conjunctiva/sclera: Conjunctivae normal.  Cardiovascular:     Rate and Rhythm: Normal rate and regular rhythm.  Pulmonary:     Effort: Pulmonary effort is normal. No respiratory distress.     Breath sounds: No wheezing.  Abdominal:     General: There is no distension.     Palpations: Abdomen is soft.  Musculoskeletal:        General: No tenderness. Normal range of motion.     Cervical back: Normal range of motion and neck supple.  Skin:    General: Skin is warm and dry.     Coloration: Skin is not pale.     Findings: No erythema or rash.  Neurological:     General: No focal deficit present.     Mental Status: He is alert and oriented to person, place, and time.  Psychiatric:        Mood and Affect: Mood normal.        Behavior: Behavior normal.        Thought Content: Thought content normal.        Judgment: Judgment normal.   He looks the other way while we look at his wound        Assessment & Plan:  Gangrenous toe with osteomyelitis status post amputation of the toe:  Streptococcus group B was isolated along with E. coli was isolated that was ampicillin resistant but amp  sulbactam sensitive.  I will have him finish his cefazolin  along with oral metronidazole and then switch him to oral Augmentin  I did note that the MIC for amp sulbactam was 4 on 1 isolate of E. coli from the 15th but was 8 on 1 from the 13th.  Will confer with ID pharmacy regarding this antibiotic choice I felt like twice daily would be easier and Augmentin would also cover anaerobes and be a good agent for his group B streptococcus  Follow-up with podiatry and track his inflammatory markers  Low threshold to repeat imaging and I would do an MRI to be aggressive   DM: Optimizing his A1c is critical is unfortunate that he has developed neuropathy and peripheral vascular disease from this unfortunate difficult to treat endocrinological problem  I spent greater than 40 minutes with the patient including greater than 50% of time in face to face counsel of the patient reading his radiographs, reviewing his laboratory data and microbiological data and in coordination of his care with home health.

## 2020-08-26 NOTE — Telephone Encounter (Signed)
Per MD called [facility] with orders to pull picc after last dose next week. Spoke with Eunice Blase who will relay orders to nursing.  Juanita Laster, RMA

## 2020-08-26 NOTE — Telephone Encounter (Signed)
Returned call to patient and gave information concerning medication per Dr Morrison Old that the authorization went thru 1 day ago and was able to pick up medicine.

## 2020-08-31 ENCOUNTER — Other Ambulatory Visit: Payer: Self-pay

## 2020-08-31 ENCOUNTER — Ambulatory Visit (INDEPENDENT_AMBULATORY_CARE_PROVIDER_SITE_OTHER): Payer: BC Managed Care – PPO | Admitting: Podiatry

## 2020-08-31 ENCOUNTER — Ambulatory Visit (INDEPENDENT_AMBULATORY_CARE_PROVIDER_SITE_OTHER): Payer: BC Managed Care – PPO

## 2020-08-31 DIAGNOSIS — L97513 Non-pressure chronic ulcer of other part of right foot with necrosis of muscle: Secondary | ICD-10-CM

## 2020-08-31 NOTE — Progress Notes (Signed)
  Subjective:  Patient ID: Austin Shaw, male    DOB: 1965/12/22,  MRN: [id number]  Chief Complaint  Patient presents with  . Routine Post Op    POV 1wk fu surgical site bleeding.  Denies any f/c/n/v. Has questions about IV abx. Yellow/brown discharge.    DOS: 07/21/20 Procedure: Right foot 1st toe amputation  DOS: 07/23/20 Procedure: Right foot delayed wound closure with partial metatarsal resection  55 y.o. male presents with the above complaint. History confirmed with patient.   Objective:  Physical Exam: no tenderness at the surgical site, local edema noted and calf supple, nontender. Incision: no significant erythema, slow healing, slight clear drainage present. No full dehiscence but central delayed area measuring 2x1  Radiographs: compared to priors, no definite erosion noted today. Assessment:   1. Ulcer of great toe, right, with necrosis of muscle (HCC)     Plan:  Patient was evaluated and treated and all questions answered.  Post-operative State -Wound with area of delayed healing -Dressed with saline WTD today. -Should wound remain slow to heal plan for wound debridement and possible skin graft substitute application. -Medihoney and dressing daily for optimal moisture balance.  Return in about 1 week (around 09/07/2020) for Wound Care.

## 2020-09-02 ENCOUNTER — Other Ambulatory Visit: Payer: Self-pay

## 2020-09-02 ENCOUNTER — Ambulatory Visit (INDEPENDENT_AMBULATORY_CARE_PROVIDER_SITE_OTHER): Payer: BC Managed Care – PPO | Admitting: Family Medicine

## 2020-09-02 ENCOUNTER — Encounter: Payer: Self-pay | Admitting: Family Medicine

## 2020-09-02 VITALS — BP 126/80 | HR 90 | Temp 98.1°F | Ht 69.0 in | Wt 342.0 lb

## 2020-09-02 DIAGNOSIS — E118 Type 2 diabetes mellitus with unspecified complications: Secondary | ICD-10-CM

## 2020-09-02 DIAGNOSIS — E1165 Type 2 diabetes mellitus with hyperglycemia: Secondary | ICD-10-CM | POA: Diagnosis not present

## 2020-09-02 DIAGNOSIS — IMO0002 Reserved for concepts with insufficient information to code with codable children: Secondary | ICD-10-CM

## 2020-09-02 LAB — POCT GLUCOSE (DEVICE FOR HOME USE): POC Glucose: 116 mg/dl — AB (ref 70–99)

## 2020-09-02 NOTE — Patient Instructions (Signed)
Plan on recheck in about 1 months, labs at the visit.  Update me as needed in the meantime.  Take care.  Glad to see you.

## 2020-09-02 NOTE — Progress Notes (Signed)
This visit occurred during the SARS-CoV-2 public health emergency.  Safety protocols were in place, including screening questions prior to the visit, additional usage of staff PPE, and extensive cleaning of exam room while observing appropriate contact time as indicated for disinfecting solutions.  History of diabetes unfortunately requiring toe amputation about 1 month ago. He is getting labs done via home health, most recent done 08/30/20 per ID clinic orders.   Still on cefazoline, not yet on augmentin.  Taking 1 percocet BID.  No ADE on med.  He has PICC R arm, to be out tomorrow.    Sugar d/w pt.  Usually ~170.  No low sugars.  Sugar 95 at OV on his meter.  He had tuna prior to the OV.  Recheck on our meter: 116, so I suspect his home readings to be reasonably accurate.  Wife has been helping him dress his wound.  He has been using a postop shoe in the meantime.  No fevers.  No purulent discharge.  Discussed phantom sensation after amputation.  He has noticed some of that, but not pain.  Meds, vitals, and allergies reviewed.   ROS: Per HPI unless specifically indicated in ROS section   nad ncat rrr ctab abd soft, not ttp Ext w/o edema.  Right foot status post first toe amputation with 4 x 1 cm resection site without erythema or discharge, still with sutures intact.  Skin well perfused.  33 minutes were devoted to patient care in this encounter (this includes time spent reviewing the patient's file/history, interviewing and examining the patient, counseling/reviewing plan with patient).

## 2020-09-07 ENCOUNTER — Other Ambulatory Visit: Payer: Self-pay

## 2020-09-07 ENCOUNTER — Ambulatory Visit (INDEPENDENT_AMBULATORY_CARE_PROVIDER_SITE_OTHER): Payer: BC Managed Care – PPO | Admitting: Podiatry

## 2020-09-07 ENCOUNTER — Ambulatory Visit: Payer: Self-pay | Admitting: Podiatry

## 2020-09-07 DIAGNOSIS — L97513 Non-pressure chronic ulcer of other part of right foot with necrosis of muscle: Secondary | ICD-10-CM

## 2020-09-07 LAB — ACID FAST CULTURE WITH REFLEXED SENSITIVITIES (MYCOBACTERIA): Acid Fast Culture: NEGATIVE

## 2020-09-07 NOTE — Progress Notes (Signed)
  Subjective:  Patient ID: Austin Shaw, male    DOB: 11/01/1965,  MRN: [id number]  No chief complaint on file.  DOS: 07/21/20 Procedure: Right foot 1st toe amputation  DOS: 07/23/20 Procedure: Right foot delayed wound closure with partial metatarsal resection  55 y.o. male presents with the above complaint. History confirmed with patient. Thinks the wound is doing about the same with an area that is not fully healing.  Objective:  Physical Exam: no tenderness at the surgical site, local edema noted and calf supple, nontender. Incision: no significant erythema, slow healing, slight clear drainage present. No full dehiscence but central delayed area measuring 2x1  Radiographs: compared to priors, no definite erosion noted today. Assessment:   1. Ulcer of great toe, right, with necrosis of muscle (HCC)    Plan:  Patient was evaluated and treated and all questions answered.  Post-operative State -Wound with area of delayed healing -Dressed with medihoney and DSD.  -Plan for debridement given continued slow healing. -Patient has failed all conservative therapy and wishes to proceed with surgical intervention. All risks, benefits, and alternatives discussed with patient. No guarantees given. Consent reviewed and signed by patient. -Planned procedures: right foot wound debridement and irrigation, possible application of skin graft substitute, possible wound closure, possible  -ASA 3 - Patient with moderate systemic disease with functional limitations; Risk factors: DM, obesity -Post-op anticoagulation: chemoprophylaxis not indicated  No follow-ups on file.  

## 2020-09-07 NOTE — H&P (View-Only) (Signed)
  Subjective:  Patient ID: Austin Shaw, male    DOB: [birth year],  MRN: [id number]  No chief complaint on file.  DOS: 07/21/20 Procedure: Right foot 1st toe amputation  DOS: 07/23/20 Procedure: Right foot delayed wound closure with partial metatarsal resection  55 y.o. male presents with the above complaint. History confirmed with patient. Thinks the wound is doing about the same with an area that is not fully healing.  Objective:  Physical Exam: no tenderness at the surgical site, local edema noted and calf supple, nontender. Incision: no significant erythema, slow healing, slight clear drainage present. No full dehiscence but central delayed area measuring 2x1  Radiographs: compared to priors, no definite erosion noted today. Assessment:   1. Ulcer of great toe, right, with necrosis of muscle (HCC)    Plan:  Patient was evaluated and treated and all questions answered.  Post-operative State -Wound with area of delayed healing -Dressed with medihoney and DSD.  -Plan for debridement given continued slow healing. -Patient has failed all conservative therapy and wishes to proceed with surgical intervention. All risks, benefits, and alternatives discussed with patient. No guarantees given. Consent reviewed and signed by patient. -Planned procedures: right foot wound debridement and irrigation, possible application of skin graft substitute, possible wound closure, possible  -ASA 3 - Patient with moderate systemic disease with functional limitations; Risk factors: DM, obesity -Post-op anticoagulation: chemoprophylaxis not indicated  No follow-ups on file.

## 2020-09-08 NOTE — Assessment & Plan Note (Signed)
Status post right first toe amputation.  Reasonable to continue antibiotics as planned for now.  Requesting lab results from ID clinic that were recently done via home health.  No fevers.  Still okay for outpatient follow-up.  Wound redressed.  Continue topical wound care as he has been doing.  Discussed diet and maintaining glucose control at home.  We can recheck his A1c episodically.  See after visit summary.  He agrees with plan.

## 2020-09-09 ENCOUNTER — Telehealth: Payer: Self-pay | Admitting: Urology

## 2020-09-09 NOTE — Telephone Encounter (Signed)
DOS - 09/17/20  RIGHT FOOT WOUND DEBRIDEMENT POSS SKIN GRAFT POSS PARTIAL WOUND CLOSURE CPT CODES - 11043 AND 82956  [employer] EFFECTIVE DATE - 04/10/20  PLAN DEDUCTIBLE -  $1,250.00 W/ $0.00 REMAINING OUT OF POCKET -  $4,890.00 W/ $0.00 REMIANING COINSURANCE -20% COPAY - $0.00   NO PRIOR AUTH REQUIRED

## 2020-09-11 ENCOUNTER — Other Ambulatory Visit: Payer: Self-pay | Admitting: Family Medicine

## 2020-09-13 ENCOUNTER — Telehealth: Payer: Self-pay | Admitting: Family Medicine

## 2020-09-13 NOTE — Telephone Encounter (Signed)
Pt spouse came into office to drop off form from [facility]. Pt is having surgery on 09/17/20 and need surgery clearance to do so. Placed in provider folder

## 2020-09-13 NOTE — Telephone Encounter (Signed)
Patient was here on 09/02/20 for OV; form placed in basket to fill out.

## 2020-09-14 NOTE — Telephone Encounter (Signed)
Form done.  Please send with last OV note.  Thanks.

## 2020-09-14 NOTE — Telephone Encounter (Signed)
Forms have been faxed and patient notified.

## 2020-09-16 ENCOUNTER — Encounter (HOSPITAL_COMMUNITY): Payer: Self-pay | Admitting: Podiatry

## 2020-09-16 NOTE — Anesthesia Preprocedure Evaluation (Addendum)
Anesthesia Evaluation  Patient identified by MRN, date of birth, ID band Patient awake    Reviewed: Allergy & Precautions, NPO status , Patient's Chart, lab work & pertinent test results, reviewed documented beta blocker date and time   Airway Mallampati: II  TM Distance: >3 FB Neck ROM: Full    Dental  (+) Missing, Chipped, Dental Advisory Given,    Pulmonary neg sleep apnea (hx OSA, resolved w/ tonsillectomy per pt 20 years ago),    Pulmonary exam normal breath sounds clear to auscultation       Cardiovascular hypertension (189/95 in preop, pt states he is normally 140s SBP), Pt. on medications and Pt. on home beta blockers + Peripheral Vascular Disease  Normal cardiovascular exam Rhythm:Regular Rate:Normal  Echo 2016: - Left ventricle: Inferobasal hypokinesis. The cavity size was  mildly dilated. Wall thickness was increased in a pattern of  moderate LVH. Systolic function was normal. The estimated  ejection fraction was in the range of 50% to 55%. Wall motion was  normal; there were no regional wall motion abnormalities. Doppler  parameters are consistent with abnormal left ventricular  relaxation (grade 1 diastolic dysfunction).  - Atrial septum: No defect or patent foramen ovale was identified.    Neuro/Psych negative neurological ROS  negative psych ROS   GI/Hepatic negative GI ROS, Neg liver ROS,   Endo/Other  diabetes, Poorly Controlled, Type 2, Oral Hypoglycemic AgentsMorbid obesityBMI 50 Last a1c 12 FS this AM 147  Renal/GU negative Renal ROS  negative genitourinary   Musculoskeletal Ulcer R foot   Abdominal (+) + obese,   Peds  Hematology negative hematology ROS (+)   Anesthesia Other Findings plavix- took yesterday   Reproductive/Obstetrics negative OB ROS                            Anesthesia Physical Anesthesia Plan  ASA: 3  Anesthesia Plan: MAC   Post-op  Pain Management:    Induction:   PONV Risk Score and Plan: 2 and Treatment may vary due to age or medical condition, Propofol infusion and TIVA  Airway Management Planned: Natural Airway and Mask  Additional Equipment: None  Intra-op Plan:   Post-operative Plan:   Informed Consent: I have reviewed the patients History and Physical, chart, labs and discussed the procedure including the risks, benefits and alternatives for the proposed anesthesia with the patient or authorized representative who has indicated his/her understanding and acceptance.     Dental advisory given  Plan Discussed with: CRNA  Anesthesia Plan Comments:       Anesthesia Quick Evaluation

## 2020-09-16 NOTE — Progress Notes (Signed)
PCP - Crawford Givens Cardiologist - Muhammad A. Arida  PPM/ICD - denies Device Orders -  Rep Notified -   Chest x-ray - 05/13/18 EKG - 06/10/20 Stress Test - none ECHO - 11/22/14 Cardiac Cath - none  Sleep Study - no CPAP - no  Fasting Blood Sugar -  Checks Blood Sugar - has Dexcom  Blood Thinner Instructions:Instructed patient to call surgeon for instructions regarding  Aspirin and Plavix  ERAS Protcol - clear liquids until 0400. PRE-SURGERY Ensure or G2- n/a  COVID TEST- n/a   Anesthesia review: no   All instructions explained to the patient, with a verbal understanding of the material.

## 2020-09-17 ENCOUNTER — Ambulatory Visit: Payer: BC Managed Care – PPO | Admitting: Infectious Disease

## 2020-09-17 ENCOUNTER — Encounter (HOSPITAL_COMMUNITY): Payer: Self-pay | Admitting: Podiatry

## 2020-09-17 ENCOUNTER — Encounter (HOSPITAL_COMMUNITY)
Admission: RE | Disposition: A | Discharge status: Home / Self Care | Payer: Self-pay | Source: Home / Self Care | Attending: Podiatry

## 2020-09-17 ENCOUNTER — Ambulatory Visit (HOSPITAL_COMMUNITY)
Admission: RE | Admit: 2020-09-17 | Discharge: 2020-09-17 | Disposition: A | Discharge status: Home / Self Care | Payer: BC Managed Care – PPO | Attending: Podiatry | Admitting: Podiatry

## 2020-09-17 ENCOUNTER — Ambulatory Visit (HOSPITAL_COMMUNITY): Payer: BC Managed Care – PPO | Admitting: Anesthesiology

## 2020-09-17 ENCOUNTER — Other Ambulatory Visit: Payer: Self-pay

## 2020-09-17 DIAGNOSIS — E11621 Type 2 diabetes mellitus with foot ulcer: Secondary | ICD-10-CM | POA: Diagnosis not present

## 2020-09-17 DIAGNOSIS — Z6841 Body Mass Index (BMI) 40.0 and over, adult: Secondary | ICD-10-CM | POA: Diagnosis not present

## 2020-09-17 DIAGNOSIS — Z7984 Long term (current) use of oral hypoglycemic drugs: Secondary | ICD-10-CM | POA: Insufficient documentation

## 2020-09-17 DIAGNOSIS — L97513 Non-pressure chronic ulcer of other part of right foot with necrosis of muscle: Secondary | ICD-10-CM

## 2020-09-17 HISTORY — PX: WOUND DEBRIDEMENT: SHX247

## 2020-09-17 LAB — BASIC METABOLIC PANEL
Anion gap: 9 (ref 5–15)
BUN: 17 mg/dL (ref 6–20)
CO2: 27 mmol/L (ref 22–32)
Calcium: 9.2 mg/dL (ref 8.9–10.3)
Chloride: 101 mmol/L (ref 98–111)
Creatinine, Ser: 1.1 mg/dL (ref 0.61–1.24)
GFR, Estimated: >60 mL/min (ref >60)
Glucose, Bld: 147 mg/dL — ABNORMAL HIGH (ref 70–99)
Potassium: 4.5 mmol/L (ref 3.5–5.1)
Sodium: 137 mmol/L (ref 135–145)

## 2020-09-17 LAB — CBC
HCT: 40 % (ref 39.0–52.0)
Hemoglobin: 12.2 g/dL — ABNORMAL LOW (ref 13.0–17.0)
MCH: 28.5 pg (ref 26.0–34.0)
MCHC: 30.5 g/dL (ref 30.0–36.0)
MCV: 93.5 fL (ref 80.0–100.0)
Platelets: 272 10*3/uL (ref 150–400)
RBC: 4.28 MIL/uL (ref 4.22–5.81)
RDW: 15.2 % (ref 11.5–15.5)
WBC: 7.7 10*3/uL (ref 4.0–10.5)
nRBC: 0 % (ref 0.0–0.2)

## 2020-09-17 LAB — GLUCOSE, CAPILLARY
Glucose-Capillary: 147 mg/dL — ABNORMAL HIGH (ref 70–99)
Glucose-Capillary: 131 mg/dL — ABNORMAL HIGH (ref 70–99)

## 2020-09-17 SURGERY — DEBRIDEMENT, WOUND
Anesthesia: General | Site: Foot | Laterality: Right

## 2020-09-17 MED ORDER — MIDAZOLAM HCL 2 MG/2ML IJ SOLN
INTRAMUSCULAR | Status: DC | PRN
  Administered 2020-09-17: 2 mg via INTRAVENOUS

## 2020-09-17 MED ORDER — ACETAMINOPHEN 500 MG PO TABS
ORAL_TABLET | ORAL | Status: AC
  Administered 2020-09-17: 1000 mg via ORAL
  Filled 2020-09-17: qty 2

## 2020-09-17 MED ORDER — GLYCOPYRROLATE PF 0.2 MG/ML IJ SOSY
PREFILLED_SYRINGE | INTRAMUSCULAR | Status: AC
  Filled 2020-09-17: qty 1

## 2020-09-17 MED ORDER — HYDROMORPHONE HCL 1 MG/ML IJ SOLN: 0.2500 mg | INTRAMUSCULAR | Status: DC | PRN

## 2020-09-17 MED ORDER — CHLORHEXIDINE GLUCONATE 0.12 % MT SOLN
OROMUCOSAL | Status: AC
  Administered 2020-09-17: 15 mL via OROMUCOSAL
  Filled 2020-09-17: qty 15

## 2020-09-17 MED ORDER — SODIUM CHLORIDE 0.9 % IR SOLN
Status: DC | PRN
  Administered 2020-09-17: 1000 mL

## 2020-09-17 MED ORDER — BUPIVACAINE HCL (PF) 0.5 % IJ SOLN
INTRAMUSCULAR | Status: AC
  Filled 2020-09-17: qty 30

## 2020-09-17 MED ORDER — PROPOFOL 10 MG/ML IV BOLUS
INTRAVENOUS | Status: AC
  Filled 2020-09-17: qty 20

## 2020-09-17 MED ORDER — OXYCODONE HCL 5 MG PO TABS
5.0000 mg | ORAL_TABLET | Freq: Once | ORAL | Status: DC | PRN
Start: 2020-09-17 — End: 2020-09-17

## 2020-09-17 MED ORDER — VANCOMYCIN HCL 1000 MG IV SOLR
INTRAVENOUS | Status: DC | PRN
  Administered 2020-09-17: 500 mg

## 2020-09-17 MED ORDER — LACTATED RINGERS IV SOLN: INTRAVENOUS | Status: DC

## 2020-09-17 MED ORDER — ONDANSETRON HCL 4 MG/2ML IJ SOLN
INTRAMUSCULAR | Status: AC
  Filled 2020-09-17: qty 2

## 2020-09-17 MED ORDER — VANCOMYCIN HCL 500 MG IV SOLR
INTRAVENOUS | Status: AC
  Filled 2020-09-17: qty 500

## 2020-09-17 MED ORDER — PROPOFOL 500 MG/50ML IV EMUL
INTRAVENOUS | Status: DC | PRN
  Administered 2020-09-17: 100 ug/kg/min via INTRAVENOUS

## 2020-09-17 MED ORDER — ONDANSETRON HCL 4 MG/2ML IJ SOLN
INTRAMUSCULAR | Status: DC | PRN
  Administered 2020-09-17: 4 mg via INTRAVENOUS

## 2020-09-17 MED ORDER — GLYCOPYRROLATE 0.2 MG/ML IJ SOLN
INTRAMUSCULAR | Status: DC | PRN
  Administered 2020-09-17 (×2): .1 mg via INTRAVENOUS

## 2020-09-17 MED ORDER — VANCOMYCIN HCL 1000 MG IV SOLR
INTRAVENOUS | Status: AC
  Filled 2020-09-17: qty 1000

## 2020-09-17 MED ORDER — DEXAMETHASONE SODIUM PHOSPHATE 10 MG/ML IJ SOLN
INTRAMUSCULAR | Status: DC | PRN
  Administered 2020-09-17: 5 mg via INTRAVENOUS

## 2020-09-17 MED ORDER — BUPIVACAINE HCL (PF) 0.5 % IJ SOLN
INTRAMUSCULAR | Status: DC | PRN
  Administered 2020-09-17: 10 mL

## 2020-09-17 MED ORDER — LACTATED RINGERS IV SOLN: INTRAVENOUS | Status: DC | PRN

## 2020-09-17 MED ORDER — FENTANYL CITRATE (PF) 250 MCG/5ML IJ SOLN
INTRAMUSCULAR | Status: AC
  Filled 2020-09-17: qty 5

## 2020-09-17 MED ORDER — CEFAZOLIN IN SODIUM CHLORIDE 3-0.9 GM/100ML-% IV SOLN
3.0000 g | INTRAVENOUS | Status: AC
  Administered 2020-09-17: 3 g via INTRAVENOUS

## 2020-09-17 MED ORDER — ORAL CARE MOUTH RINSE: 15.0000 mL | Freq: Once | OROMUCOSAL | Status: AC

## 2020-09-17 MED ORDER — DEXAMETHASONE SODIUM PHOSPHATE 10 MG/ML IJ SOLN
INTRAMUSCULAR | Status: AC
  Filled 2020-09-17: qty 1

## 2020-09-17 MED ORDER — ACETAMINOPHEN 500 MG PO TABS: 1000.0000 mg | ORAL_TABLET | Freq: Once | ORAL | Status: AC

## 2020-09-17 MED ORDER — CHLORHEXIDINE GLUCONATE 0.12 % MT SOLN: 15.0000 mL | Freq: Once | OROMUCOSAL | Status: AC

## 2020-09-17 MED ORDER — MIDAZOLAM HCL 2 MG/2ML IJ SOLN
INTRAMUSCULAR | Status: AC
  Filled 2020-09-17: qty 2

## 2020-09-17 MED ORDER — OXYCODONE-ACETAMINOPHEN 5-325 MG PO TABS: 1.0000 | ORAL_TABLET | ORAL | 0 refills | Status: DC | PRN

## 2020-09-17 MED ORDER — OXYCODONE HCL 5 MG/5ML PO SOLN: 5.0000 mg | Freq: Once | ORAL | Status: DC | PRN

## 2020-09-17 MED ORDER — CARVEDILOL 12.5 MG PO TABS
12.5000 mg | ORAL_TABLET | ORAL | Status: AC
  Administered 2020-09-17: 12.5 mg via ORAL
  Filled 2020-09-17: qty 1

## 2020-09-17 MED ORDER — FENTANYL CITRATE (PF) 250 MCG/5ML IJ SOLN
INTRAMUSCULAR | Status: DC | PRN
  Administered 2020-09-17 (×2): 25 ug via INTRAVENOUS

## 2020-09-17 MED ORDER — CEFAZOLIN IN SODIUM CHLORIDE 3-0.9 GM/100ML-% IV SOLN
INTRAVENOUS | Status: AC
  Filled 2020-09-17: qty 100

## 2020-09-17 MED ORDER — ONDANSETRON HCL 4 MG/2ML IJ SOLN: 4.0000 mg | Freq: Once | INTRAMUSCULAR | Status: DC | PRN

## 2020-09-17 SURGICAL SUPPLY — 43 items
BNDG ELASTIC 4X5.8 VLCR STR LF (GAUZE/BANDAGES/DRESSINGS) ×2 IMPLANT
BNDG ESMARK 4X9 LF (GAUZE/BANDAGES/DRESSINGS) IMPLANT
BNDG GAUZE ELAST 4 BULKY (GAUZE/BANDAGES/DRESSINGS) ×2 IMPLANT
CHLORAPREP W/TINT 26 (MISCELLANEOUS) ×2 IMPLANT
COVER SURGICAL LIGHT HANDLE (MISCELLANEOUS) ×2 IMPLANT
COVER WAND RF STERILE (DRAPES) ×2 IMPLANT
CUFF TOURN SGL QUICK 18X4 (TOURNIQUET CUFF) IMPLANT
DRAPE U-SHAPE 47X51 STRL (DRAPES) ×2 IMPLANT
DRSG TELFA 3X8 NADH (GAUZE/BANDAGES/DRESSINGS) ×2 IMPLANT
ELECT CAUTERY BLADE 6.4 (BLADE) ×2 IMPLANT
ELECT REM PT RETURN 9FT ADLT (ELECTROSURGICAL) ×2
ELECTRODE REM PT RTRN 9FT ADLT (ELECTROSURGICAL) ×1 IMPLANT
GAUZE SPONGE 4X4 12PLY STRL (GAUZE/BANDAGES/DRESSINGS) ×2 IMPLANT
GLOVE BIO SURGEON STRL SZ7.5 (GLOVE) ×2 IMPLANT
GLOVE SRG 8 PF TXTR STRL LF DI (GLOVE) ×1 IMPLANT
GLOVE SURG UNDER POLY LF SZ8 (GLOVE) ×1
GOWN STRL REUS W/ TWL LRG LVL3 (GOWN DISPOSABLE) ×1 IMPLANT
GOWN STRL REUS W/ TWL XL LVL3 (GOWN DISPOSABLE) ×1 IMPLANT
GOWN STRL REUS W/TWL LRG LVL3 (GOWN DISPOSABLE) ×1
GOWN STRL REUS W/TWL XL LVL3 (GOWN DISPOSABLE) ×1
KIT BASIN OR (CUSTOM PROCEDURE TRAY) ×2 IMPLANT
KIT TURNOVER KIT B (KITS) ×2 IMPLANT
MANIFOLD NEPTUNE II (INSTRUMENTS) ×2 IMPLANT
MATRIX WOUND MESHED 2X2 (Tissue) ×1 IMPLANT
MICROMATRIX 1000MG (Tissue) ×2 IMPLANT
NEEDLE BIOPSY JAMSHIDI 8X6 (NEEDLE) IMPLANT
NEEDLE HYPO 25GX1X1/2 BEV (NEEDLE) ×2 IMPLANT
NS IRRIG 1000ML POUR BTL (IV SOLUTION) ×2 IMPLANT
PACK ORTHO EXTREMITY (CUSTOM PROCEDURE TRAY) ×2 IMPLANT
PAD ARMBOARD 7.5X6 YLW CONV (MISCELLANEOUS) ×4 IMPLANT
PROBE DEBRIDE SONICVAC MISONIX (TIP) ×2 IMPLANT
SET CYSTO W/LG BORE CLAMP LF (SET/KITS/TRAYS/PACK) ×2 IMPLANT
SOL PREP POV-IOD 4OZ 10% (MISCELLANEOUS) ×4 IMPLANT
SOLUTION PARTIC MCRMTRX 1000MG (Tissue) ×1 IMPLANT
STAPLER VISISTAT 35W (STAPLE) ×2 IMPLANT
SUT ETHILON 2 0 FS 18 (SUTURE) ×2 IMPLANT
SYR CONTROL 10ML LL (SYRINGE) IMPLANT
TOWEL GREEN STERILE (TOWEL DISPOSABLE) ×2 IMPLANT
TOWEL GREEN STERILE FF (TOWEL DISPOSABLE) ×2 IMPLANT
TUBE CONNECTING 12X1/4 (SUCTIONS) ×2 IMPLANT
TUBE IRRIGATION SET MISONIX (TUBING) ×2 IMPLANT
WOUND MATRIX MESHED 2X2 (Tissue) ×1 IMPLANT
YANKAUER SUCT BULB TIP NO VENT (SUCTIONS) ×2 IMPLANT

## 2020-09-17 NOTE — Op Note (Signed)
Patient Name: Austin Shaw DOB: 1966-03-17  MRN: [id number]   Date of Service: 09/17/20    Surgeon: Dr. Hardie Pulley, DPM Assistants: None Pre-operative Diagnosis: Diabetic ulcer right foot Post-operative Diagnosis: Same Procedures:             1) Debridment and irrigation of right foot wound  2) Partial wound closure  3) Application of skin graft substitute Pathology/Specimens: * No specimens in log * Anesthesia: MAC Hemostasis: Anatomic Estimated Blood Loss: 5 ml Materials:  Implant Name Type Inv. Item Serial No. Manufacturer Lot No. LRB No. Used Action  MICROMATRIX 1000MG - [id number] Tissue MICROMATRIX 1000MG [id number] Right 1 Implanted  WOUND MATRIX MESHED 2X2 - [id number] Tissue WOUND MATRIX MESHED 2X2  [employer] [id number] Right 1 Implanted    Medications: 10 ml marcaine 0.5% plain, 1g vancomycin powder, Complications: None  Indications for Procedure:  This is a 55 y.o. male with a chronic non-healing right foot wound. He presents today for debridement to enhance healing.   Procedure in Detail: Patient was identified in pre-operative holding area. Formal consent was signed and the right lower extremity was marked. Patient was brought back to the operating room and placed on the operating room table in the supine position. Anesthesia was induced.   The extremity was prepped and draped in the usual sterile fashion. Timeout was taken to confirm patient name, laterality, and procedure prior to incision. Attention was then directed to the right foot where a wound measuring 5*2*0.5 was encountered.  The wound was sharply excisionally debrided with a 15 blade, followed by a misonix ultrasonic debrider. Debridement was performed to bleeding, viable wound base. The wound was debrided to the level of the fascial tissue. Following debridement the wound measured 5*2.5*1.  Micromatrix powder mixed with vancomycin powder and a little bit of saline to make a puddy  was applied into the wound. The wound edges were brought more together with 2-0 nylon mattress sutures. Integra bilayer graft was split in half and applied longitudinally along the incision and stapled into placed. Surgilube was applied to the graft.  The foot was then dressed with telfa, 4x4, kerlix, and ACE bandage. Patient tolerated the procedure well.   Disposition: Following a period of post-operative monitoring, patient will be transferred back home.

## 2020-09-17 NOTE — Transfer of Care (Signed)
Immediate Anesthesia Transfer of Care Note  Patient: Austin Shaw  Procedure(s) Performed: DEBRIDEMENT OF RIGHT FOOT WOUND, PARTIAL WOUND CLOSURE, APPLICATION OF SKIN GRAFT SUBSTITUTE (Right: Foot)  Patient Location: PACU  Anesthesia Type:MAC  Level of Consciousness: awake, alert , oriented and patient cooperative  Airway & Oxygen Therapy: Patient Spontanous Breathing and Patient connected to nasal cannula oxygen  Post-op Assessment: Report given to RN, Post -op Vital signs reviewed and stable and Patient moving all extremities X 4  Post vital signs: Reviewed and stable  Last Vitals:  Vitals Value Taken Time  BP 117/58 09/17/20 0759  Temp    Pulse 78 09/17/20 0759  Resp 19 09/17/20 0759  SpO2 97 % 09/17/20 0759  Vitals shown include unvalidated device data.  Last Pain:  Vitals:   09/17/20 0656  TempSrc:   PainSc: 1       Patients Stated Pain Goal: 5 (91/44/45 8483)  Complications: No notable events documented.

## 2020-09-17 NOTE — Anesthesia Postprocedure Evaluation (Signed)
Anesthesia Post Note  Patient: Teague Newbern  Procedure(s) Performed: DEBRIDEMENT OF RIGHT FOOT WOUND, PARTIAL WOUND CLOSURE, APPLICATION OF SKIN GRAFT SUBSTITUTE (Right: Foot)     Patient location during evaluation: PACU Anesthesia Type: MAC Level of consciousness: awake and alert, oriented and patient cooperative Pain management: pain level controlled Vital Signs Assessment: post-procedure vital signs reviewed and stable Respiratory status: spontaneous breathing, nonlabored ventilation and respiratory function stable Cardiovascular status: blood pressure returned to baseline and stable Postop Assessment: no apparent nausea or vomiting Anesthetic complications: no   No notable events documented.  Last Vitals:  Vitals:   09/17/20 0815 09/17/20 0830  BP: 122/69 133/70  Pulse: 75 73  Resp: 20 19  Temp:  (!) 36.2 C  SpO2: 96% 98%    Last Pain:  Vitals:   09/17/20 0759  TempSrc:   PainSc: 0-No pain                 Pervis Hocking

## 2020-09-17 NOTE — Anesthesia Procedure Notes (Signed)
Procedure Name: MAC Date/Time: 09/17/2020 7:33 AM Performed by: Kathryne Hitch, CRNA Pre-anesthesia Checklist: Patient identified, Emergency Drugs available, Suction available and Patient being monitored Patient Re-evaluated:Patient Re-evaluated prior to induction Oxygen Delivery Method: Simple face mask Preoxygenation: Pre-oxygenation with 100% oxygen Induction Type: IV induction Placement Confirmation: positive ETCO2 Dental Injury: Teeth and Oropharynx as per pre-operative assessment

## 2020-09-17 NOTE — Interval H&P Note (Signed)
History and Physical Interval Note:  09/17/2020 7:05 AM  Austin Shaw  has presented today for surgery, with the diagnosis of Ulcer right foot necrosis muscle.  The various methods of treatment have been discussed with the patient and family. After consideration of risks, benefits and other options for treatment, the patient has consented to  Procedure(s): DEBRIDEMENT WOUND, POSSIBLE SKIN GRAFT SUBSTITUTE, POSSIBLE BONE BIOPSY (Right) as a surgical intervention.  The patient's history has been reviewed, patient examined, no change in status, stable for surgery.  I have reviewed the patient's chart and labs.  Questions were answered to the patient's satisfaction.     Park Liter

## 2020-09-17 NOTE — Addendum Note (Signed)
Addendum  created 09/17/20 1212 by Lannie Fields, DO   Clinical Note Signed

## 2020-09-20 ENCOUNTER — Telehealth (INDEPENDENT_AMBULATORY_CARE_PROVIDER_SITE_OTHER): Payer: BC Managed Care – PPO | Admitting: Infectious Disease

## 2020-09-20 ENCOUNTER — Encounter (HOSPITAL_COMMUNITY): Payer: Self-pay | Admitting: Podiatry

## 2020-09-20 ENCOUNTER — Other Ambulatory Visit: Payer: Self-pay

## 2020-09-20 VITALS — Wt 340.0 lb

## 2020-09-20 DIAGNOSIS — I739 Peripheral vascular disease, unspecified: Secondary | ICD-10-CM

## 2020-09-20 DIAGNOSIS — E118 Type 2 diabetes mellitus with unspecified complications: Secondary | ICD-10-CM

## 2020-09-20 DIAGNOSIS — E1165 Type 2 diabetes mellitus with hyperglycemia: Secondary | ICD-10-CM

## 2020-09-20 DIAGNOSIS — M86179 Other acute osteomyelitis, unspecified ankle and foot: Secondary | ICD-10-CM | POA: Diagnosis not present

## 2020-09-20 DIAGNOSIS — A498 Other bacterial infections of unspecified site: Secondary | ICD-10-CM

## 2020-09-20 DIAGNOSIS — A491 Streptococcal infection, unspecified site: Secondary | ICD-10-CM | POA: Diagnosis not present

## 2020-09-20 HISTORY — DX: Streptococcal infection, unspecified site: A49.1

## 2020-09-20 HISTORY — DX: Other bacterial infections of unspecified site: A49.8

## 2020-09-20 MED ORDER — AMOXICILLIN-POT CLAVULANATE 875-125 MG PO TABS: 1.0000 | ORAL_TABLET | Freq: Two times a day (BID) | ORAL | 1 refills | Status: DC

## 2020-09-20 NOTE — Progress Notes (Signed)
Virtual Visit via Video Note  I connected with Austin Shaw on 09/20/20 at  4:15 PM EDT by a video enabled telemedicine application and verified that I am speaking with the correct person using two identifiers.  Location: Patient: Home Provider: [facility]   I discussed the limitations of evaluation and management by telemedicine and the availability of in person appointments. The patient expressed understanding and agreed to proceed.  History of Present Illness:   55 y.o. male with these mellitus peripheral vascular disease status post revascularization with gangrenous toe with osteomyelitis status post I&D of bursal collection and amputation of first toe then repeat trip to the OR for debridement and resection with cultures from both surgeries yielding group B streptococcus and E. coli that was fairly sensitive.  We  placed him on IV cefazolin along with oral metronidazole.   He had been taking both medications religiously including the metronidazole which she tries to take as quickly as possible to avoid the unpleasant taste gives and ones mouth.   I saw him last on May 19 that was encouraged by the appearance of his wound but discouraged that his inflammatory markers had increased.  We switched him from IV ceftriaxone and oral metronidazole to Augmentin.  Has been taking that since then.  Unfortunately his wound required further debridement and he underwent debridement irrigation of his right foot wound on the third June by Dr. March Rummage but with placement of skin graft over the wound apparently there have been concerned that there will be need for bone biopsy but this was not performed.  Has continued on Augmentin since then.  His postoperative pain is improving.  He has follow-up appoint with Dr. March Rummage this Friday.  Past Medical History:  Diagnosis Date   Acute osteomyelitis of toe ([place]) 08/26/2020   Allergy    Diabetes mellitus    type 2   Hyperlipidemia    Hypertension    Morbid  obesity ([place])    BMI 50   Sleep apnea    pt states he no longer has sleep apnea since tonsillectomy    Past Surgical History:  Procedure Laterality Date   ABDOMINAL AORTOGRAM W/LOWER EXTREMITY N/A 07/14/2020   Procedure: ABDOMINAL AORTOGRAM W/LOWER EXTREMITY;  Surgeon: Wellington Hampshire, MD;  Location: [facility];  Service: Cardiovascular;  Laterality: N/A;   AMPUTATION Right 07/21/2020   Procedure: AMPUTATION RIGHT GREAT TOE;  Surgeon: Evelina Bucy, DPM;  Location: [facility];  Service: Podiatry;  Laterality: Right;   IRRIGATION AND DEBRIDEMENT FOOT Right 07/21/2020   Procedure: IRRIGATION AND DEBRIDEMENT RIGHT GREAT TOE;  Surgeon: Evelina Bucy, DPM;  Location: [facility];  Service: Podiatry;  Laterality: Right;   METATARSAL HEAD EXCISION Right 07/23/2020   Procedure: METATARSAL HEAD EXCISION;  Surgeon: Evelina Bucy, DPM;  Location: [facility];  Service: Podiatry;  Laterality: Right;   TONSILLECTOMY     WOUND DEBRIDEMENT Right 07/23/2020   Procedure: DEBRIDEMENT WOUND WITH DELAYED WOUND CLOSURE;  Surgeon: Evelina Bucy, DPM;  Location: [facility];  Service: Podiatry;  Laterality: Right;   WOUND DEBRIDEMENT Right 09/17/2020   Procedure: DEBRIDEMENT OF RIGHT FOOT WOUND, PARTIAL WOUND CLOSURE, APPLICATION OF SKIN GRAFT SUBSTITUTE;  Surgeon: Evelina Bucy, DPM;  Location: [facility];  Service: Podiatry;  Laterality: Right;    Family History  Problem Relation Age of Onset   Diabetes Father    Diabetes Other        fam hx 1st degree relative   Congestive Heart Failure  Other    Lupus Daughter    Sjogren's syndrome Daughter    Colon cancer Neg Hx    Prostate cancer Neg Hx    Esophageal cancer Neg Hx    Rectal cancer Neg Hx       Social History   Socioeconomic History   Marital status: Married    Spouse name: Not on file   Number of children: Not on file   Years of education: Not on file   Highest education level: Not on file  Occupational History   Occupation: Account Programme researcher, broadcasting/film/video: [employer]  Tobacco Use   Smoking status: Never   Smokeless tobacco: Never  Vaping Use   Vaping Use: Never used  Substance and Sexual Activity   Alcohol use: No    Alcohol/week: 0.0 standard drinks   Drug use: No   Sexual activity: Not on file  Other Topics Concern   Not on file  Social History Narrative   Working at [employer].    4 kids   Married, 1998   From [place]   Social Determinants of Health   Financial Resource Strain: Not on [employer] Insecurity: Not on file  Transportation Needs: Not on file  Physical Activity: Not on file  Stress: Not on file  Social Connections: Not on file    Allergies  Allergen Reactions   Lipitor [Atorvastatin]     intolerant     Current Outpatient Medications:    aspirin EC 81 MG tablet, Take 1 tablet (81 mg total) by mouth daily. Swallow whole., Disp: , Rfl:    atorvastatin (LIPITOR) 40 MG tablet, Take 0.5 tablets (20 mg total) by mouth daily., Disp: , Rfl:    Blood Glucose Monitoring Suppl (ONE TOUCH ULTRA SYSTEM KIT) w/Device KIT, Check blood sugar twice a day and as directed. Dx E11.8, Disp: 1 each, Rfl: 0   carvedilol (COREG) 12.5 MG tablet, Take 1 tablet (12.5 mg total) by mouth 2 (two) times daily with a meal., Disp: 180 tablet, Rfl: 0   clopidogrel (PLAVIX) 75 MG tablet, Take 1 tablet (75 mg total) by mouth daily., Disp: 30 tablet, Rfl: 6   Continuous Blood Gluc Receiver (FREESTYLE LIBRE 14 DAY READER) DEVI, Use to check blood sugar daily. Dx : E11.9, Disp: 1 each, Rfl: 1   Continuous Blood Gluc Sensor (FREESTYLE LIBRE 14 DAY SENSOR) MISC, Use to check blood sugars daily. Dx: E11.9, Disp: 1 each, Rfl: 12   Dulaglutide (TRULICITY) 0.26 VZ/8.5YI SOPN, Inject 0.75 mg into the skin once a week. Every Friday in the evening (Patient taking differently: Inject 0.75 mg into the skin once a week. Every Wednesday in the evening), Disp: , Rfl:    furosemide (LASIX) 40 MG tablet, TAKE 1/2 TABLET BY MOUTH DAILY AS NEEDED FOR FLUID OR  EDEMA, Disp: 45 tablet, Rfl: 1   glucose blood test strip, CHECK BLOOD SUGAR TWICE DAILY, Disp: 100 each, Rfl: 3   hydrALAZINE (APRESOLINE) 25 MG tablet, Take 1 tablet (25 mg total) by mouth 3 (three) times daily., Disp: 90 tablet, Rfl: 1   losartan (COZAAR) 100 MG tablet, Take 1 tablet (100 mg total) by mouth daily. (Patient taking differently: Take 100 mg by mouth daily. Sometimes takes 50 mg in the morning and 50 mg at bedtime), Disp: 90 tablet, Rfl: 0   metFORMIN (GLUCOPHAGE) 500 MG tablet, Take 500-1,000 mg by mouth See admin instructions. Take 1000 mg in the morning and 500 mg in the evening, Disp: ,  Rfl:    oxyCODONE-acetaminophen (PERCOCET) 5-325 MG tablet, Take 1 tablet by mouth every 4 (four) hours as needed for severe pain., Disp: 20 tablet, Rfl: 0   oxyCODONE-acetaminophen (PERCOCET/ROXICET) 5-325 MG tablet, Take 1-2 tablets by mouth every 6 (six) hours as needed for moderate pain., Disp: 30 tablet, Rfl: 0   tobramycin (TOBREX) 0.3 % ophthalmic solution, Place 1 drop into both eyes See admin instructions. After eye injection, Disp: , Rfl:    amoxicillin-clavulanate (AUGMENTIN) 875-125 MG tablet, Take 1 tablet by mouth 2 (two) times daily., Disp: 60 tablet, Rfl: 1    Observations/Objective:  Altan appears comfortable and in no acute distress over the video feed  Assessment and Plan:  Diabetic foot infection with osteomyelitis status post debridement again on June 10  Continue him on Augmentin I have sent it another month supply with refills.  He will be followed closely by Dr. March Rummage and I have scheduled to see me when I return to [facility] and I will see him in the second week of July.   Follow Up Instructions:    I discussed the assessment and treatment plan with the patient. The patient was provided an opportunity to ask questions and all were answered. The patient agreed with the plan and demonstrated an understanding of the instructions.   The patient was advised to  call back or seek an in-person evaluation if the symptoms worsen or if the condition fails to improve as anticipated.  I spent more than 30 minutes with the patient including greater than 50% of time in face to face counseling of the patient personally reviewing radiographs, along with pertinent laboratory microbiological data review of medical records and in coordination of his care.    Alcide Evener, MD

## 2020-09-22 ENCOUNTER — Telehealth: Payer: Self-pay | Admitting: Podiatry

## 2020-09-22 ENCOUNTER — Encounter: Payer: Self-pay | Admitting: Podiatry

## 2020-09-22 NOTE — Telephone Encounter (Signed)
Pt wanted to know if he should clean or sanitize the wound, does not want to see another provider. He will wait until Tuesday.

## 2020-09-24 ENCOUNTER — Encounter: Payer: BC Managed Care – PPO | Admitting: Podiatry

## 2020-09-27 ENCOUNTER — Other Ambulatory Visit: Payer: Self-pay | Admitting: Family Medicine

## 2020-09-27 NOTE — Telephone Encounter (Signed)
Will address tomorrow

## 2020-09-28 ENCOUNTER — Ambulatory Visit (INDEPENDENT_AMBULATORY_CARE_PROVIDER_SITE_OTHER): Payer: BC Managed Care – PPO | Admitting: Podiatry

## 2020-09-28 ENCOUNTER — Ambulatory Visit: Payer: BC Managed Care – PPO

## 2020-09-28 ENCOUNTER — Other Ambulatory Visit: Payer: Self-pay

## 2020-09-28 DIAGNOSIS — L97513 Non-pressure chronic ulcer of other part of right foot with necrosis of muscle: Secondary | ICD-10-CM

## 2020-09-28 MED ORDER — CARVEDILOL 12.5 MG PO TABS: 12.5000 mg | ORAL_TABLET | Freq: Two times a day (BID) | ORAL | 0 refills | Status: DC

## 2020-09-29 ENCOUNTER — Encounter: Payer: Self-pay | Admitting: Family Medicine

## 2020-09-29 MED ORDER — HYDRALAZINE HCL 25 MG PO TABS: 25.0000 mg | ORAL_TABLET | Freq: Three times a day (TID) | ORAL | 2 refills | Status: DC

## 2020-10-04 ENCOUNTER — Other Ambulatory Visit: Payer: Self-pay

## 2020-10-04 ENCOUNTER — Ambulatory Visit: Payer: BC Managed Care – PPO | Admitting: Family Medicine

## 2020-10-04 ENCOUNTER — Encounter: Payer: Self-pay | Admitting: Family Medicine

## 2020-10-04 VITALS — BP 142/90 | HR 77 | Temp 97.1°F | Ht 69.0 in | Wt 333.0 lb

## 2020-10-04 DIAGNOSIS — IMO0002 Reserved for concepts with insufficient information to code with codable children: Secondary | ICD-10-CM

## 2020-10-04 DIAGNOSIS — I5022 Chronic systolic (congestive) heart failure: Secondary | ICD-10-CM

## 2020-10-04 DIAGNOSIS — I429 Cardiomyopathy, unspecified: Secondary | ICD-10-CM

## 2020-10-04 DIAGNOSIS — E118 Type 2 diabetes mellitus with unspecified complications: Secondary | ICD-10-CM | POA: Diagnosis not present

## 2020-10-04 DIAGNOSIS — E1165 Type 2 diabetes mellitus with hyperglycemia: Secondary | ICD-10-CM

## 2020-10-04 DIAGNOSIS — G720 Drug-induced myopathy: Secondary | ICD-10-CM | POA: Diagnosis not present

## 2020-10-04 LAB — POCT GLYCOSYLATED HEMOGLOBIN (HGB A1C): Hemoglobin A1C: 7 % — AB (ref 4.0–5.6)

## 2020-10-04 NOTE — Progress Notes (Signed)
This visit occurred during the SARS-CoV-2 public health emergency.  Safety protocols were in place, including screening questions prior to the visit, additional usage of staff PPE, and extensive cleaning of exam room while observing appropriate contact time as indicated for disinfecting solutions.  Diabetes:  Using medications without difficulties: yes Hypoglycemic episodes:no Hyperglycemic episodes:no Feet problems: see exam below Blood Sugars averaging:usually 115-140 eye exam within last year: yes On trulicity 0.75mg  and metformin 1500mg  a day.    Didn't tolerate lipitor at all, even with lower dose.  Discussed statin history and also talked about cardiology referral.  He wanted to go to [place].  Referral placed.  Intentional weight loss noted.  D/w pt.  Status post right first toe amputation.  Limiting weight bearing on the R foot.   Still on abx with ID f/u clinic pending.  Meds, vitals, and allergies reviewed.  ROS: Per HPI unless specifically indicated in ROS section   GEN: nad, alert and oriented HEENT: ncat NECK: supple w/o LA CV: rrr. PULM: ctab, no inc wob ABD: soft, +bs EXT: trace BLE edema SKIN: no acute rash Right foot status post first toe amputation with healing amputation site noted.  No spreading erythema.  Staples still intact at the wound edge.  He has some mild swelling in the foot but no streaking erythema.  Wound redressed with nonstick bandages and gauze.

## 2020-10-04 NOTE — Patient Instructions (Signed)
We'll call about seeing cardiology.  Take care.  Glad to see you.  Don't change your meds for now.  If you have lower or higher sugars then let me know (<100 or above 200).  Plan on recheck in 3 months.  Labs at the visit.

## 2020-10-06 DIAGNOSIS — G720 Drug-induced myopathy: Secondary | ICD-10-CM | POA: Insufficient documentation

## 2020-10-06 NOTE — Assessment & Plan Note (Signed)
Refer back to cardiology.

## 2020-10-06 NOTE — Assessment & Plan Note (Signed)
A1c improved, discussed with patient about options.  No change in meds at this point..  If lower or higher sugars then let me know (<100 or above 200). Plan on recheck in 3 months.  Labs at the visit.  He is going to follow-up podiatry and ID clinic about his foot wound in the meantime. Continue on trulicity 0.75mg  and metformin 1500mg  a day.

## 2020-10-06 NOTE — Assessment & Plan Note (Signed)
Statin intolerant.  Refer back to cardiology.

## 2020-10-08 ENCOUNTER — Encounter: Payer: BC Managed Care – PPO | Admitting: Podiatry

## 2020-10-08 ENCOUNTER — Ambulatory Visit (INDEPENDENT_AMBULATORY_CARE_PROVIDER_SITE_OTHER): Payer: BC Managed Care – PPO | Admitting: Podiatry

## 2020-10-08 ENCOUNTER — Ambulatory Visit: Payer: Self-pay | Admitting: Podiatry

## 2020-10-08 ENCOUNTER — Telehealth: Payer: Self-pay | Admitting: Family Medicine

## 2020-10-08 ENCOUNTER — Other Ambulatory Visit: Payer: Self-pay

## 2020-10-08 DIAGNOSIS — L97513 Non-pressure chronic ulcer of other part of right foot with necrosis of muscle: Secondary | ICD-10-CM

## 2020-10-08 DIAGNOSIS — I739 Peripheral vascular disease, unspecified: Secondary | ICD-10-CM

## 2020-10-08 NOTE — H&P (View-Only) (Signed)
  Subjective:  Patient ID: Austin Shaw, male    DOB: 10/27/1965,  MRN: [id number]  Chief Complaint  Patient presents with   Routine Post Op    POV#2 DOS 6.10.2022 RT FOOT WOUND DEBRIDEMENT, SKIN GRAF SUBSTITUTE, POSS PARTIAL WOUND CLOSURE, BONE BIOPSY IF INDICATED. Denies fever/chills/nausea/vomiting. No new concerns. "Is the graft torn?"    DOS: 07/21/20 Procedure: Right foot 1st toe amputation  DOS: 07/23/20 Procedure: Right foot delayed wound closure with partial metatarsal resection  55 y.o. male presents with the above complaint. History confirmed with patient.    Objective:  Physical Exam: no tenderness at the surgical site, local edema noted and calf supple, nontender. Incision: right foot wound improved measuring approx 2x0.5 distally, 1.5x0.5 proximally. No warmth, erythema, SOI.   Assessment:   1. Ulcer of great toe, right, with necrosis of muscle (HCC)   2. PVD (peripheral vascular disease) (HCC)     Plan:  Patient was evaluated and treated and all questions answered.  Post-operative State -Graft and staples adhering the graft to the wound removed today.  Sutures left intact -Wound appears to be well improved.  I do think that he would benefit from repeat debridement with application skin graft substitute. -Patient has failed all conservative therapy and wishes to proceed with surgical intervention. All risks, benefits, and alternatives discussed with patient. No guarantees given. Consent reviewed and signed by patient. -Planned procedures: right foot wound debridement and irrigation, application of skin graft substitute.    No follow-ups on file.  

## 2020-10-08 NOTE — Progress Notes (Signed)
  Subjective:  Patient ID: Austin Shaw, male    DOB: 1965-08-09,  MRN: [id number]  No chief complaint on file.  DOS: 07/21/20 Procedure: Right foot 1st toe amputation  DOS: 07/23/20 Procedure: Right foot delayed wound closure with partial metatarsal resection  55 y.o. male presents with the above complaint. History confirmed with patient.    Objective:  Physical Exam: no tenderness at the surgical site, local edema noted and calf supple, nontender. Incision: graft well adhered, wound unstageable. No warmth, erythema, SOI.   Assessment:   1. Ulcer of great toe, right, with necrosis of muscle (HCC)    Plan:  Patient was evaluated and treated and all questions answered.  Post-operative State -Wound well appearing with intact graft. -Dressed with adaptic, 4x4, kerlix, ACE bandage. -F/u in 1 week for recheck.  No follow-ups on file.

## 2020-10-08 NOTE — Telephone Encounter (Signed)
Forms have been placed in your inbox

## 2020-10-08 NOTE — Progress Notes (Signed)
  Subjective:  Patient ID: Austin Shaw, male    DOB: 02-26-1966,  MRN: [id number]  Chief Complaint  Patient presents with   Routine Post Op    POV#2 DOS 6.10.2022 RT FOOT WOUND DEBRIDEMENT, SKIN GRAF SUBSTITUTE, POSS PARTIAL WOUND CLOSURE, BONE BIOPSY IF INDICATED. Denies fever/chills/nausea/vomiting. No new concerns. "Is the graft torn?"    DOS: 07/21/20 Procedure: Right foot 1st toe amputation  DOS: 07/23/20 Procedure: Right foot delayed wound closure with partial metatarsal resection  55 y.o. male presents with the above complaint. History confirmed with patient.    Objective:  Physical Exam: no tenderness at the surgical site, local edema noted and calf supple, nontender. Incision: right foot wound improved measuring approx 2x0.5 distally, 1.5x0.5 proximally. No warmth, erythema, SOI.   Assessment:   1. Ulcer of great toe, right, with necrosis of muscle (HCC)   2. PVD (peripheral vascular disease) (HCC)     Plan:  Patient was evaluated and treated and all questions answered.  Post-operative State -Graft and staples adhering the graft to the wound removed today.  Sutures left intact -Wound appears to be well improved.  I do think that he would benefit from repeat debridement with application skin graft substitute. -Patient has failed all conservative therapy and wishes to proceed with surgical intervention. All risks, benefits, and alternatives discussed with patient. No guarantees given. Consent reviewed and signed by patient. -Planned procedures: right foot wound debridement and irrigation, application of skin graft substitute.    No follow-ups on file.

## 2020-10-08 NOTE — Telephone Encounter (Signed)
Austin Shaw wife dropped off a surgery clearance form to filled out.

## 2020-10-11 NOTE — Telephone Encounter (Signed)
I'll work on the hard copy.  Thanks.  

## 2020-10-14 ENCOUNTER — Other Ambulatory Visit: Payer: Self-pay

## 2020-10-14 ENCOUNTER — Encounter (HOSPITAL_COMMUNITY): Payer: Self-pay | Admitting: Podiatry

## 2020-10-14 NOTE — Progress Notes (Signed)
PCP - Dr. Para March Cardiologist - denies EKG - 07/08/20 Chest x-ray -  ECHO - 2016 Cardiac Cath -  CPAP - no  Fasting Blood Sugar:  120-150 Checks Blood Sugar:  1x/day (pt usually has CBG monitor, but recently fell off so he is checking manually)   Blood Thinner Instructions: Follow your surgeon's instructions on when to stop Plavix.  If no instructions were given by your surgeon then you will need to call the office to get those instructions.    Aspirin Instructions:   ERAS Protcol - yes  COVID TEST- n/a  Anesthesia review: n/a  -------------  SDW INSTRUCTIONS:  Your procedure is scheduled on Friday 7/8. Please report to [facility] Main Entrance "A" at 05:30 A.M., and check in at the Admitting office. Call this number if you have problems the morning of surgery: [phone]   Remember: Do not eat after midnight the night before your surgery  You may drink clear liquids until 04:15 AM the morning of your surgery.   Clear liquids allowed are: Water, Non-Citrus Juices (without pulp), Carbonated Beverages, Clear Tea, Black Coffee Only, and Gatorade   Medications to take morning of surgery with a sip of water include: amoxicillin-clavulanate (AUGMENTIN) carvedilol (COREG) hydrALAZINE (APRESOLINE)  oxyCODONE-acetaminophen (PERCOCET) if needed   Follow your surgeon's instructions on when to stop clopidogrel (PLAVIX).  If no instructions were given by your surgeon then you will need to call the office to get those instructions.    As of today, STOP taking any Aspirin (unless otherwise instructed by your surgeon), Aleve, Naproxen, Ibuprofen, Motrin, Advil, Goody's, BC's, all herbal medications, fish oil, and all vitamins.   ** PLEASE check your blood sugar the morning of your surgery when you wake up and every 2 hours until you get to the Short Stay unit.  If your blood sugar is less than 70 mg/dL, you will need to treat for low blood sugar: Do not take insulin. Treat a low  blood sugar (less than 70 mg/dL) with  cup of clear juice (cranberry or apple), 4 glucose tablets, OR glucose gel. Recheck blood sugar in 15 minutes after treatment (to make sure it is greater than 70 mg/dL). If your blood sugar is not greater than 70 mg/dL on recheck, call [phone] for further instructions.   metFORMIN (GLUCOPHAGE) - no oral diabetic medication day of surgery   The Morning of Surgery Do not wear jewelry, make-up or nail polish. Do not wear lotions, powders, colognes, or deodorant Men may shave face and neck. Do not bring valuables to the hospital. [facility] is not responsible for any belongings or valuables.  If you are a smoker, DO NOT Smoke 24 hours prior to surgery If you wear a CPAP at night please bring your mask the morning of surgery  Remember that you must have someone to transport you home after your surgery, and remain with you for 24 hours if you are discharged the same day.  Please bring cases for contacts, glasses, hearing aids, dentures or bridgework because it cannot be worn into surgery.   Patients discharged the day of surgery will not be allowed to drive home.   Please shower the NIGHT BEFORE/MORNING OF SURGERY (use antibacterial soap like DIAL soap if possible). Wear comfortable clothes the morning of surgery. Oral Hygiene is also important to reduce your risk of infection.  Remember - BRUSH YOUR TEETH THE MORNING OF SURGERY WITH YOUR REGULAR TOOTHPASTE  Patient denies shortness of breath, fever, cough and chest  pain.

## 2020-10-14 NOTE — Anesthesia Preprocedure Evaluation (Addendum)
Anesthesia Evaluation  Patient identified by MRN, date of birth, ID band Patient awake    Reviewed: Allergy & Precautions, NPO status , Patient's Chart, lab work & pertinent test results, reviewed documented beta blocker date and time   Airway Mallampati: III  TM Distance: >3 FB Neck ROM: Full    Dental no notable dental hx. (+) Teeth Intact, Dental Advisory Given   Pulmonary sleep apnea ,  No longer on CPAP after tonsillectomy 20 years ago   Pulmonary exam normal breath sounds clear to auscultation       Cardiovascular hypertension, Pt. on medications + Peripheral Vascular Disease  Normal cardiovascular exam Rhythm:Regular Rate:Normal     Neuro/Psych  Neuromuscular disease negative psych ROS   GI/Hepatic negative GI ROS, Neg liver ROS,   Endo/Other  diabetes, Poorly Controlled, Type 2, Oral Hypoglycemic AgentsMorbid obesityHb A1c 7.0  Renal/GU negative Renal ROS  negative genitourinary   Musculoskeletal Open wound right foot   Abdominal (+) + obese,   Peds  Hematology Plavix therapy - Last dose   Anesthesia Other Findings   Reproductive/Obstetrics                            Anesthesia Physical Anesthesia Plan  ASA: 3  Anesthesia Plan: MAC   Post-op Pain Management:    Induction: Intravenous  PONV Risk Score and Plan: 2 and Treatment may vary due to age or medical condition, Propofol infusion and Ondansetron  Airway Management Planned: Natural Airway and Simple Face Mask  Additional Equipment:   Intra-op Plan:   Post-operative Plan:   Informed Consent: I have reviewed the patients History and Physical, chart, labs and discussed the procedure including the risks, benefits and alternatives for the proposed anesthesia with the patient or authorized representative who has indicated his/her understanding and acceptance.     Dental advisory given  Plan Discussed with: CRNA and  Anesthesiologist  Anesthesia Plan Comments:        Anesthesia Quick Evaluation

## 2020-10-15 ENCOUNTER — Ambulatory Visit (HOSPITAL_COMMUNITY)
Admission: RE | Admit: 2020-10-15 | Discharge: 2020-10-15 | Disposition: A | Discharge status: Home / Self Care | Payer: BC Managed Care – PPO | Attending: Podiatry | Admitting: Podiatry

## 2020-10-15 ENCOUNTER — Encounter (HOSPITAL_COMMUNITY)
Admission: RE | Disposition: A | Discharge status: Home / Self Care | Payer: Self-pay | Source: Home / Self Care | Attending: Podiatry

## 2020-10-15 ENCOUNTER — Encounter (HOSPITAL_COMMUNITY): Payer: Self-pay | Admitting: Podiatry

## 2020-10-15 ENCOUNTER — Ambulatory Visit (HOSPITAL_COMMUNITY): Payer: BC Managed Care – PPO | Admitting: Anesthesiology

## 2020-10-15 DIAGNOSIS — Z89411 Acquired absence of right great toe: Secondary | ICD-10-CM | POA: Insufficient documentation

## 2020-10-15 DIAGNOSIS — L97513 Non-pressure chronic ulcer of other part of right foot with necrosis of muscle: Secondary | ICD-10-CM | POA: Diagnosis not present

## 2020-10-15 DIAGNOSIS — E1151 Type 2 diabetes mellitus with diabetic peripheral angiopathy without gangrene: Secondary | ICD-10-CM | POA: Insufficient documentation

## 2020-10-15 DIAGNOSIS — L97519 Non-pressure chronic ulcer of other part of right foot with unspecified severity: Secondary | ICD-10-CM | POA: Diagnosis not present

## 2020-10-15 DIAGNOSIS — E11621 Type 2 diabetes mellitus with foot ulcer: Secondary | ICD-10-CM | POA: Insufficient documentation

## 2020-10-15 HISTORY — PX: WOUND DEBRIDEMENT: SHX247

## 2020-10-15 LAB — GLUCOSE, CAPILLARY
Glucose-Capillary: 146 mg/dL — ABNORMAL HIGH (ref 70–99)
Glucose-Capillary: 142 mg/dL — ABNORMAL HIGH (ref 70–99)

## 2020-10-15 SURGERY — DEBRIDEMENT, WOUND
Anesthesia: Monitor Anesthesia Care | Site: Foot | Laterality: Right

## 2020-10-15 MED ORDER — ONDANSETRON HCL 4 MG/2ML IJ SOLN
INTRAMUSCULAR | Status: DC | PRN
  Administered 2020-10-15: 4 mg via INTRAVENOUS

## 2020-10-15 MED ORDER — PROPOFOL 10 MG/ML IV BOLUS
INTRAVENOUS | Status: AC
  Filled 2020-10-15: qty 20

## 2020-10-15 MED ORDER — VANCOMYCIN HCL 1000 MG IV SOLR
INTRAVENOUS | Status: DC | PRN
  Administered 2020-10-15: 1000 mg via TOPICAL

## 2020-10-15 MED ORDER — CEFAZOLIN IN SODIUM CHLORIDE 3-0.9 GM/100ML-% IV SOLN: 3.0000 g | INTRAVENOUS | Status: DC

## 2020-10-15 MED ORDER — LACTATED RINGERS IV SOLN: INTRAVENOUS | Status: DC

## 2020-10-15 MED ORDER — PROPOFOL 500 MG/50ML IV EMUL
INTRAVENOUS | Status: DC | PRN
  Administered 2020-10-15: 50 ug/kg/min via INTRAVENOUS

## 2020-10-15 MED ORDER — MIDAZOLAM HCL 2 MG/2ML IJ SOLN
INTRAMUSCULAR | Status: AC
  Filled 2020-10-15: qty 2

## 2020-10-15 MED ORDER — DEXMEDETOMIDINE (PRECEDEX) IN NS 20 MCG/5ML (4 MCG/ML) IV SYRINGE
PREFILLED_SYRINGE | INTRAVENOUS | Status: DC | PRN
  Administered 2020-10-15 (×2): 20 ug via INTRAVENOUS

## 2020-10-15 MED ORDER — VANCOMYCIN HCL 1000 MG IV SOLR
INTRAVENOUS | Status: AC
  Filled 2020-10-15: qty 1000

## 2020-10-15 MED ORDER — PROPOFOL 1000 MG/100ML IV EMUL
INTRAVENOUS | Status: AC
  Filled 2020-10-15: qty 100

## 2020-10-15 MED ORDER — ONDANSETRON HCL 4 MG/2ML IJ SOLN
INTRAMUSCULAR | Status: AC
  Filled 2020-10-15: qty 2

## 2020-10-15 MED ORDER — OXYCODONE-ACETAMINOPHEN 5-325 MG PO TABS: 1.0000 | ORAL_TABLET | ORAL | 0 refills | Status: DC | PRN

## 2020-10-15 MED ORDER — LIDOCAINE 2% (20 MG/ML) 5 ML SYRINGE
INTRAMUSCULAR | Status: AC
  Filled 2020-10-15: qty 5

## 2020-10-15 MED ORDER — LIDOCAINE HCL (CARDIAC) PF 100 MG/5ML IV SOSY
PREFILLED_SYRINGE | INTRAVENOUS | Status: DC | PRN
  Administered 2020-10-15: 100 mg via INTRAVENOUS

## 2020-10-15 MED ORDER — ORAL CARE MOUTH RINSE: 15.0000 mL | Freq: Once | OROMUCOSAL | Status: AC

## 2020-10-15 MED ORDER — CEPHALEXIN 500 MG PO CAPS: ORAL_CAPSULE | ORAL | 0 refills | Status: DC

## 2020-10-15 MED ORDER — MIDAZOLAM HCL 5 MG/5ML IJ SOLN
INTRAMUSCULAR | Status: DC | PRN
  Administered 2020-10-15: 2 mg via INTRAVENOUS

## 2020-10-15 MED ORDER — CHLORHEXIDINE GLUCONATE 0.12 % MT SOLN
OROMUCOSAL | Status: AC
  Filled 2020-10-15: qty 15

## 2020-10-15 MED ORDER — CEFAZOLIN SODIUM-DEXTROSE 2-4 GM/100ML-% IV SOLN
INTRAVENOUS | Status: AC
  Filled 2020-10-15: qty 100

## 2020-10-15 MED ORDER — BUPIVACAINE HCL 0.5 % IJ SOLN
INTRAMUSCULAR | Status: AC
  Filled 2020-10-15: qty 1

## 2020-10-15 MED ORDER — FENTANYL CITRATE (PF) 250 MCG/5ML IJ SOLN
INTRAMUSCULAR | Status: AC
  Filled 2020-10-15: qty 5

## 2020-10-15 MED ORDER — DEXMEDETOMIDINE (PRECEDEX) IN NS 20 MCG/5ML (4 MCG/ML) IV SYRINGE
PREFILLED_SYRINGE | INTRAVENOUS | Status: AC
  Filled 2020-10-15: qty 10

## 2020-10-15 MED ORDER — PHENYLEPHRINE HCL-NACL 10-0.9 MG/250ML-% IV SOLN
INTRAVENOUS | Status: AC
  Filled 2020-10-15: qty 500

## 2020-10-15 MED ORDER — CHLORHEXIDINE GLUCONATE 0.12 % MT SOLN
15.0000 mL | Freq: Once | OROMUCOSAL | Status: AC
  Administered 2020-10-15: 15 mL via OROMUCOSAL

## 2020-10-15 MED ORDER — BUPIVACAINE HCL (PF) 0.5 % IJ SOLN
INTRAMUSCULAR | Status: DC | PRN
  Administered 2020-10-15: 10 mL

## 2020-10-15 SURGICAL SUPPLY — 40 items
BAG COUNTER SPONGE SURGICOUNT (BAG) ×2 IMPLANT
BNDG ELASTIC 4X5.8 VLCR STR LF (GAUZE/BANDAGES/DRESSINGS) IMPLANT
BNDG ESMARK 4X9 LF (GAUZE/BANDAGES/DRESSINGS) IMPLANT
BNDG GAUZE ELAST 4 BULKY (GAUZE/BANDAGES/DRESSINGS) ×4 IMPLANT
CHLORAPREP W/TINT 26 (MISCELLANEOUS) ×2 IMPLANT
COVER SURGICAL LIGHT HANDLE (MISCELLANEOUS) ×2 IMPLANT
CUFF TOURN SGL QUICK 18X4 (TOURNIQUET CUFF) IMPLANT
CUFF TOURN SGL QUICK 34 (TOURNIQUET CUFF)
CUFF TRNQT CYL 34X4.125X (TOURNIQUET CUFF) IMPLANT
DRAPE U-SHAPE 47X51 STRL (DRAPES) ×2 IMPLANT
DRSG MEPITEL 4X7.2 (GAUZE/BANDAGES/DRESSINGS) ×2 IMPLANT
ELECT CAUTERY BLADE 6.4 (BLADE) ×2 IMPLANT
ELECT REM PT RETURN 9FT ADLT (ELECTROSURGICAL) ×2
ELECTRODE REM PT RTRN 9FT ADLT (ELECTROSURGICAL) ×1 IMPLANT
GAUZE SPONGE 4X4 12PLY STRL (GAUZE/BANDAGES/DRESSINGS) ×6 IMPLANT
GLOVE SRG 8 PF TXTR STRL LF DI (GLOVE) ×1 IMPLANT
GLOVE SURG ENC MOIS LTX SZ7.5 (GLOVE) ×2 IMPLANT
GLOVE SURG UNDER POLY LF SZ8 (GLOVE) ×1
GOWN STRL REUS W/ TWL LRG LVL3 (GOWN DISPOSABLE) ×1 IMPLANT
GOWN STRL REUS W/ TWL XL LVL3 (GOWN DISPOSABLE) ×1 IMPLANT
GOWN STRL REUS W/TWL LRG LVL3 (GOWN DISPOSABLE) ×1
GOWN STRL REUS W/TWL XL LVL3 (GOWN DISPOSABLE) ×1
KIT BASIN OR (CUSTOM PROCEDURE TRAY) ×2 IMPLANT
KIT TURNOVER KIT B (KITS) ×2 IMPLANT
MANIFOLD NEPTUNE II (INSTRUMENTS) IMPLANT
MEMBRANE AMNIO 2.5X2.5 AFFNTY (Tissue) ×4 IMPLANT
NEEDLE BIOPSY JAMSHIDI 8X6 (NEEDLE) IMPLANT
NEEDLE HYPO 25GX1X1/2 BEV (NEEDLE) ×2 IMPLANT
NS IRRIG 1000ML POUR BTL (IV SOLUTION) ×2 IMPLANT
PACK ORTHO EXTREMITY (CUSTOM PROCEDURE TRAY) ×2 IMPLANT
PAD ARMBOARD 7.5X6 YLW CONV (MISCELLANEOUS) IMPLANT
PROBE DEBRIDE SONICVAC MISONIX (TIP) ×2 IMPLANT
SET CYSTO W/LG BORE CLAMP LF (SET/KITS/TRAYS/PACK) IMPLANT
SOL PREP POV-IOD 4OZ 10% (MISCELLANEOUS) ×4 IMPLANT
SUT ETHILON 3 0 PS 1 (SUTURE) ×2 IMPLANT
SYR CONTROL 10ML LL (SYRINGE) ×2 IMPLANT
TOWEL GREEN STERILE (TOWEL DISPOSABLE) ×2 IMPLANT
TOWEL GREEN STERILE FF (TOWEL DISPOSABLE) ×2 IMPLANT
TUBE CONNECTING 12X1/4 (SUCTIONS) ×2 IMPLANT
YANKAUER SUCT BULB TIP NO VENT (SUCTIONS) ×2 IMPLANT

## 2020-10-15 NOTE — Progress Notes (Signed)
Orthopedic Tech Progress Note Patient Details:  Austin Shaw 1965-05-02 [id number]  Ortho Devices Type of Ortho Device: Postop shoe/boot Ortho Device/Splint Location: RLE Ortho Device/Splint Interventions: Ordered      Bella Kennedy A Alok Minshall 10/15/2020, 8:33 AM

## 2020-10-15 NOTE — Anesthesia Postprocedure Evaluation (Signed)
Anesthesia Post Note  Patient: Austin Shaw  Procedure(s) Performed: DEBRIDEMENT WOUND with Application of skin graft substitute (Right: Foot)     Patient location during evaluation: PACU Anesthesia Type: MAC Level of consciousness: awake and alert and oriented Pain management: pain level controlled Vital Signs Assessment: post-procedure vital signs reviewed and stable Respiratory status: spontaneous breathing, nonlabored ventilation and respiratory function stable Cardiovascular status: stable and blood pressure returned to baseline Postop Assessment: no apparent nausea or vomiting Anesthetic complications: no   No notable events documented.  Last Vitals:  Vitals:   10/15/20 0809 10/15/20 0824  BP: 135/82 (!) 155/86  Pulse: 64 62  Resp: 18 13  Temp:  36.7 C  SpO2: 92% 97%    Last Pain:  Vitals:   10/15/20 0824  TempSrc:   PainSc: 0-No pain                 Johnathan Heskett A.

## 2020-10-15 NOTE — Op Note (Signed)
Patient Name: Austin Shaw DOB: 03/05/1966  MRN: [id number]   Date of Service: 10/15/20   Surgeon: Dr. Hardie Pulley, DPM Assistants: None Pre-operative Diagnosis: ulcer right foot Post-operative Diagnosis: same Procedures:             1) Debridement and irrigation of right foot - to fascial tissue. Pathology/Specimens: * No specimens in log * Anesthesia: MAC/local Hemostasis: Anatomic Estimated Blood Loss: 52m Materials:  Implant Name Type Inv. Item Serial No. Manufacturer Lot No. LRB No. Used Action  AFFINITY 2.5X2.5 CM ORGANOGENESIS AF-1250 Tissue  [id number] [id number] Right 1 Implanted  AFFINITY 2.5X2.5CM AF-1250 Tissue  [id number] INC [id number] 1 Implanted   Medications: 1g vancomycin topical. Complications: None  Indications for Procedure:  This is a 55y.o. male with a right foot wound. He presents for debridement and skin graft substitute to promote healing. All risks, benefits, and alternatives were discussed.   Procedure in Detail: Patient was identified in pre-operative holding area. Formal consent was signed and the right lower extremity was marked. Patient was brought back to the operating room and placed on the operating room table in the supine position. Anesthesia was induced.   The extremity was prepped and draped in the usual sterile fashion. Timeout was taken to confirm patient name, laterality, and procedure prior to incision. Attention was then directed to the right foot where a wound measuring 4x1 was encountered.  The wound was sharply excisionally debrided with a 15 blade, followed by a misonix ultrasonic debrider. Debridement was performed to bleeding, viable wound base. The wound was debrided to the level of the fascial tissue. Following debridement the wound measured 4x1.5.   Affinity grafts x2 were applied to the wound. The wound edges were then sutured to re-approximate the edges. Vancomycin powder was applied topically.  The wound was covered with mepitel. The foot was then dressed with 4x4, kerlix, and ACE bandage. Patient tolerated the procedure well.   Disposition: Following a period of post-operative monitoring, patient will be transferred back home.

## 2020-10-15 NOTE — Interval H&P Note (Signed)
History and Physical Interval Note:  10/15/2020 6:57 AM  Austin Shaw  has presented today for surgery, with the diagnosis of Ulcer right foot.  The various methods of treatment have been discussed with the patient and family. After consideration of risks, benefits and other options for treatment, the patient has consented to  Procedure(s): DEBRIDEMENT WOUND with Application of skin graft substitute (Right) as a surgical intervention.  The patient's history has been reviewed, patient examined, no change in status, stable for surgery.  I have reviewed the patient's chart and labs.  Questions were answered to the patient's satisfaction.     Park Liter

## 2020-10-15 NOTE — Transfer of Care (Signed)
Immediate Anesthesia Transfer of Care Note  Patient: Austin Shaw  Procedure(s) Performed: DEBRIDEMENT WOUND with Application of skin graft substitute (Right: Foot)  Patient Location: PACU  Anesthesia Type:MAC  Level of Consciousness: awake and drowsy  Airway & Oxygen Therapy: Patient Spontanous Breathing and Patient connected to face mask oxygen  Post-op Assessment: Report given to RN and Post -op Vital signs reviewed and stable  Post vital signs: Reviewed and stable  Last Vitals:  Vitals Value Taken Time  BP 132/77 10/15/20 0754  Temp    Pulse 67 10/15/20 0755  Resp 24 10/15/20 0755  SpO2 91 % 10/15/20 0755  Vitals shown include unvalidated device data.  Last Pain:  Vitals:   10/15/20 0544  TempSrc: Oral         Complications: No notable events documented.

## 2020-10-18 ENCOUNTER — Encounter (HOSPITAL_COMMUNITY): Payer: Self-pay | Admitting: Podiatry

## 2020-10-20 ENCOUNTER — Encounter (HOSPITAL_COMMUNITY): Payer: Self-pay | Admitting: Podiatry

## 2020-10-21 ENCOUNTER — Ambulatory Visit (INDEPENDENT_AMBULATORY_CARE_PROVIDER_SITE_OTHER): Payer: BC Managed Care – PPO | Admitting: Infectious Disease

## 2020-10-21 ENCOUNTER — Other Ambulatory Visit: Payer: Self-pay

## 2020-10-21 ENCOUNTER — Encounter: Payer: Self-pay | Admitting: Infectious Disease

## 2020-10-21 VITALS — BP 188/98 | HR 79 | Temp 98.2°F | Wt 333.0 lb

## 2020-10-21 DIAGNOSIS — I739 Peripheral vascular disease, unspecified: Secondary | ICD-10-CM

## 2020-10-21 DIAGNOSIS — A498 Other bacterial infections of unspecified site: Secondary | ICD-10-CM | POA: Diagnosis not present

## 2020-10-21 DIAGNOSIS — E118 Type 2 diabetes mellitus with unspecified complications: Secondary | ICD-10-CM

## 2020-10-21 DIAGNOSIS — A491 Streptococcal infection, unspecified site: Secondary | ICD-10-CM | POA: Diagnosis not present

## 2020-10-21 DIAGNOSIS — M86179 Other acute osteomyelitis, unspecified ankle and foot: Secondary | ICD-10-CM | POA: Diagnosis not present

## 2020-10-21 NOTE — Progress Notes (Signed)
Subjective:    Patient ID: Austin Shaw, male    DOB: 11-Nov-1965, 55 y.o.   MRN: [id number]  HPI  55 y.o. male with these mellitus peripheral vascular disease status post revascularization with gangrenous toe with osteomyelitis status post I&D of bursal collection and amputation of first toe then repeat trip to the OR for debridement and resection with cultures from both surgeries yielding group B streptococcus and E. coli that was fairly sensitive.  We  placed him on IV cefazolin along with oral metronidazole.   He had been taking both medications religiously including the metronidazole which she tries to take as quickly as possible to avoid the unpleasant taste gives and ones mouth.   I saw him last on May 19 that was encouraged by the appearance of his wound but discouraged that his inflammatory markers had increased.   We switched him from IV ceftriaxone and oral metronidazole to Augmentin.  Has been taking that since then.  Unfortunately his wound required further debridement and he underwent debridement irrigation of his right foot wound on the third June by Dr. March Rummage but with placement of skin graft over the wound apparently there have been concerned that there will be need for bone biopsy but this was not performed.  Consent he had apparent failure of the graft and need for further debridement and placement of a new skin graft.  He otherwise feels much better occasionally with some phantom limb pain.  He is going to see Dr. March Rummage again tomorrow and apparently is going to potentially have another graft placed.  He would like to remain on the Augmentin until he has resolution of his wound.    Past Medical History:  Diagnosis Date   Acute osteomyelitis of toe ([place]) 08/26/2020   Allergy    Diabetes mellitus    type 2   E coli infection 09/20/2020   Group B streptococcal infection 09/20/2020   Hyperlipidemia    Hypertension    Morbid obesity (HCC)    BMI 50   Sleep  apnea    pt states he no longer has sleep apnea since tonsillectomy    Past Surgical History:  Procedure Laterality Date   ABDOMINAL AORTOGRAM W/LOWER EXTREMITY N/A 07/14/2020   Procedure: ABDOMINAL AORTOGRAM W/LOWER EXTREMITY;  Surgeon: Wellington Hampshire, MD;  Location: [place] [facility];  Service: Cardiovascular;  Laterality: N/A;   AMPUTATION Right 07/21/2020   Procedure: AMPUTATION RIGHT GREAT TOE;  Surgeon: Evelina Bucy, DPM;  Location: [place];  Service: Podiatry;  Laterality: Right;   IRRIGATION AND DEBRIDEMENT FOOT Right 07/21/2020   Procedure: IRRIGATION AND DEBRIDEMENT RIGHT GREAT TOE;  Surgeon: Evelina Bucy, DPM;  Location: [place];  Service: Podiatry;  Laterality: Right;   METATARSAL HEAD EXCISION Right 07/23/2020   Procedure: METATARSAL HEAD EXCISION;  Surgeon: Evelina Bucy, DPM;  Location: [place];  Service: Podiatry;  Laterality: Right;   TONSILLECTOMY     WOUND DEBRIDEMENT Right 07/23/2020   Procedure: DEBRIDEMENT WOUND WITH DELAYED WOUND CLOSURE;  Surgeon: Evelina Bucy, DPM;  Location: [place];  Service: Podiatry;  Laterality: Right;   WOUND DEBRIDEMENT Right 09/17/2020   Procedure: DEBRIDEMENT OF RIGHT FOOT WOUND, PARTIAL WOUND CLOSURE, APPLICATION OF SKIN GRAFT SUBSTITUTE;  Surgeon: Evelina Bucy, DPM;  Location: [place];  Service: Podiatry;  Laterality: Right;   WOUND DEBRIDEMENT Right 10/15/2020   Procedure: DEBRIDEMENT WOUND with Application of skin graft substitute;  Surgeon: Evelina Bucy, DPM;  Location: [place];  Service: Podiatry;  Laterality: Right;    Family History  Problem Relation Age of Onset   Diabetes Father    Diabetes Other        fam hx 1st degree relative   Congestive Heart Failure Other    Lupus Daughter    Sjogren's syndrome Daughter    Colon cancer Neg Hx    Prostate cancer Neg Hx    Esophageal cancer Neg Hx    Rectal cancer Neg Hx       Social History   Socioeconomic History   Marital status: Married    Spouse name: Not on file    Number of children: Not on file   Years of education: Not on file   Highest education level: Not on file  Occupational History   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    4 kids   Married, 1998   From [place]   Social Determinants of Radio broadcast assistant Strain: Not on [employer] Insecurity: Not on file  Transportation Needs: Not on file  Physical Activity: Not on file  Stress: Not on file  Social Connections: Not on file    Allergies  Allergen Reactions   Lipitor [Atorvastatin] Other (See Comments)    Lightheaded & dizzy     Current Outpatient Medications:    amoxicillin-clavulanate (AUGMENTIN) 875-125 MG tablet, Take 1 tablet by mouth 2 (two) times daily., Disp: 60 tablet, Rfl: 1   Blood Glucose Monitoring Suppl (ONE TOUCH ULTRA SYSTEM KIT) w/Device KIT, Check blood sugar twice a day and as directed. Dx E11.8, Disp: 1 each, Rfl: 0   carvedilol (COREG) 12.5 MG tablet, Take 1 tablet (12.5 mg total) by mouth 2 (two) times daily with a meal., Disp: 180 tablet, Rfl: 0   clopidogrel (PLAVIX) 75 MG tablet, Take 1 tablet (75 mg total) by mouth daily. (Patient taking differently: Take 75 mg by mouth in the morning.), Disp: 30 tablet, Rfl: 6   Continuous Blood Gluc Receiver (FREESTYLE LIBRE 14 DAY READER) DEVI, Use to check blood sugar daily. Dx : E11.9, Disp: 1 each, Rfl: 1   Continuous Blood Gluc Sensor (FREESTYLE LIBRE 14 DAY SENSOR) MISC, Use to check blood sugars daily. Dx: E11.9, Disp: 1 each, Rfl: 12   Dulaglutide (TRULICITY) 0.07 MA/2.6JF SOPN, Inject 0.75 mg into the skin once a week. Every Friday in the evening (Patient taking  differently: Inject 0.75 mg into the skin every Wednesday.), Disp: , Rfl:    furosemide (LASIX) 40 MG tablet, TAKE 1/2 TABLET BY MOUTH DAILY AS NEEDED FOR FLUID OR EDEMA (Patient taking differently: Take 40 mg by mouth daily as needed (fluid retention.).), Disp: 45 tablet, Rfl: 1   glucose blood test strip, CHECK BLOOD SUGAR TWICE DAILY, Disp: 100 each, Rfl: 3   hydrALAZINE (APRESOLINE) 25 MG tablet, Take 1 tablet (25 mg total) by mouth 3 (three) times daily., Disp: 90 tablet, Rfl: 2   losartan (COZAAR) 100 MG tablet, Take 1 tablet (100 mg total) by mouth daily. (Patient taking differently: Take 100 mg by mouth in the morning.),  Disp: 90 tablet, Rfl: 0   metFORMIN (GLUCOPHAGE) 500 MG tablet, Take 500-1,000 mg by mouth See admin instructions. Take 2 tablets (1000 mg) by mouth in the morning and take 1 tablet (500 mg) by mouth in the evening, Disp: , Rfl:    oxyCODONE-acetaminophen (PERCOCET) 5-325 MG tablet, Take 1 tablet by mouth every 4 (four) hours as needed for severe pain., Disp: 20 tablet, Rfl: 0   tobramycin (TOBREX) 0.3 % ophthalmic solution, Place 1 drop into both eyes See admin instructions. Instill 1 drop both eyes 3 times daily the day before eye injection every 6-8 weeks, Disp: , Rfl:    cephALEXin (KEFLEX) 500 MG capsule, Take 1 tablet by mouth today, then one tomorrow AM and one in PM (Patient not taking: Reported on 10/21/2020), Disp: 3 capsule, Rfl: 0   Review of Systems  Constitutional:  Negative for chills and fever.  HENT:  Negative for congestion and sore throat.   Eyes:  Negative for photophobia.  Respiratory:  Negative for cough, shortness of breath and wheezing.   Cardiovascular:  Negative for chest pain, palpitations and leg swelling.  Gastrointestinal:  Negative for abdominal pain, blood in stool, constipation, diarrhea, nausea and vomiting.  Genitourinary:  Negative for dysuria, flank pain and hematuria.  Musculoskeletal:  Negative for back pain and myalgias.  Skin:   Negative for rash.  Neurological:  Negative for dizziness, weakness and headaches.  Hematological:  Does not bruise/bleed easily.  Psychiatric/Behavioral:  Negative for agitation, behavioral problems, confusion, decreased concentration, dysphoric mood, hallucinations, self-injury and suicidal ideas. The patient is not nervous/anxious and is not hyperactive.       Objective:   Physical Exam Constitutional:      General: He is not in acute distress.    Appearance: Normal appearance. He is well-developed. He is not ill-appearing or diaphoretic.  HENT:     Head: Normocephalic and atraumatic.     Right Ear: Hearing and external ear normal.     Left Ear: Hearing and external ear normal.     Nose: No nasal deformity or rhinorrhea.  Eyes:     General: No scleral icterus.    Conjunctiva/sclera: Conjunctivae normal.     Right eye: Right conjunctiva is not injected.     Left eye: Left conjunctiva is not injected.     Pupils: Pupils are equal, round, and reactive to light.  Neck:     Vascular: No JVD.  Cardiovascular:     Rate and Rhythm: Normal rate and regular rhythm.     Heart sounds: S1 normal and S2 normal.  Pulmonary:     Effort: No respiratory distress.     Breath sounds: No wheezing.  Abdominal:     General: There is no distension.     Palpations: Abdomen is soft.  Musculoskeletal:        General: Normal range of motion.     Right shoulder: Normal.     Left shoulder: Normal.     Cervical back: Normal range of motion and neck supple.     Right hip: Normal.     Left hip: Normal.     Right knee: Normal.     Left knee: Normal.  Lymphadenopathy:     Head:     Right side of head: No submandibular, preauricular or posterior auricular adenopathy.     Left side of head: No submandibular, preauricular or posterior auricular adenopathy.     Cervical: No cervical adenopathy.     Right cervical: No  superficial or deep cervical adenopathy.    Left cervical: No superficial or deep  cervical adenopathy.  Skin:    General: Skin is warm and dry.     Coloration: Skin is not pale.     Findings: No abrasion, bruising, ecchymosis, erythema, lesion or rash.     Nails: There is no clubbing.  Neurological:     General: No focal deficit present.     Mental Status: He is alert and oriented to person, place, and time.     Sensory: No sensory deficit.     Coordination: Coordination normal.     Gait: Gait normal.  Psychiatric:        Attention and Perception: He is attentive.        Mood and Affect: Mood normal.        Speech: Speech normal.        Behavior: Behavior normal. Behavior is cooperative.        Thought Content: Thought content normal.        Judgment: Judgment normal.    Right foot still dressed and bandaged today        Assessment & Plan:   Diabetic foot infection with osteomyelitis status post debridement in June further debridement July with skin graft placement  I am comfortable continue Augmentin I will plan on seeing him in August.  In the interim we will continue to follow very closely with Dr. March Rummage.  I spent more than 30 minutes with the patient including face to face counseling of the patient personally reviewing radiographs, along with pertinent laboratory microbiological, data review of medical records before and during the visit and in coordination of his care.

## 2020-10-22 ENCOUNTER — Ambulatory Visit (INDEPENDENT_AMBULATORY_CARE_PROVIDER_SITE_OTHER): Payer: BC Managed Care – PPO | Admitting: Podiatry

## 2020-10-22 DIAGNOSIS — L97413 Non-pressure chronic ulcer of right heel and midfoot with necrosis of muscle: Secondary | ICD-10-CM

## 2020-10-22 DIAGNOSIS — E11621 Type 2 diabetes mellitus with foot ulcer: Secondary | ICD-10-CM | POA: Diagnosis not present

## 2020-10-22 LAB — CBC WITH DIFFERENTIAL/PLATELET
Absolute Monocytes: 614 cells/uL (ref 200–950)
Basophils Absolute: 83 cells/uL (ref 0–200)
Basophils Relative: 1 %
Eosinophils Absolute: 382 cells/uL (ref 15–500)
Eosinophils Relative: 4.6 %
HCT: 39.4 % (ref 38.5–50.0)
Hemoglobin: 12.6 g/dL — ABNORMAL LOW (ref 13.2–17.1)
Lymphs Abs: 2623 cells/uL (ref 850–3900)
MCH: 28.8 pg (ref 27.0–33.0)
MCHC: 32 g/dL (ref 32.0–36.0)
MCV: 90 fL (ref 80.0–100.0)
MPV: 11.3 fL (ref 7.5–12.5)
Monocytes Relative: 7.4 %
Neutro Abs: 4598 cells/uL (ref 1500–7800)
Neutrophils Relative %: 55.4 %
Platelets: 266 10*3/uL (ref 140–400)
RBC: 4.38 10*6/uL (ref 4.20–5.80)
RDW: 14 % (ref 11.0–15.0)
Total Lymphocyte: 31.6 %
WBC: 8.3 10*3/uL (ref 3.8–10.8)

## 2020-10-22 LAB — SEDIMENTATION RATE: Sed Rate: 82 mm/h — ABNORMAL HIGH (ref 0–20)

## 2020-10-22 LAB — BASIC METABOLIC PANEL WITH GFR
BUN: 22 mg/dL (ref 7–25)
CO2: 28 mmol/L (ref 20–32)
Calcium: 9.2 mg/dL (ref 8.6–10.3)
Chloride: 103 mmol/L (ref 98–110)
Creat: 1.22 mg/dL (ref 0.70–1.30)
Glucose, Bld: 143 mg/dL — ABNORMAL HIGH (ref 65–99)
Potassium: 4.5 mmol/L (ref 3.5–5.3)
Sodium: 139 mmol/L (ref 135–146)
eGFR: 71 mL/min/{1.73_m2} (ref >=60)

## 2020-10-22 LAB — C-REACTIVE PROTEIN: CRP: 15.5 mg/L — ABNORMAL HIGH (ref <8.0)

## 2020-10-22 NOTE — Progress Notes (Signed)
  Subjective:  Patient ID: Austin Shaw, male    DOB: 26-Jan-1966,  MRN: [id number]  No chief complaint on file.  DOS: 07/21/20 Procedure: Right foot 1st toe amputation  DOS: 07/23/20 Procedure: Right foot delayed wound closure with partial metatarsal resection  DOS: 10/15/20 Procedure: Right foot debridement and irrigation, application of skin graft substitute  55 y.o. male presents with the above complaint. History confirmed with patient. Here for post-op check states he did not change dressing, as directed. Denies post-op concerns.  Objective:  Physical Exam: no tenderness at the surgical site, local edema noted and calf supple, nontender. Incision: right foot wound improved measuring approx 1*0.5 distally, 1.5*1.0 proximally. No warmth, erythema, SOI.   Assessment:   1. Diabetic ulcer of right midfoot associated with type 2 diabetes mellitus, with necrosis of muscle (HCC)    Plan:  Patient was evaluated and treated and all questions answered.  Ulcer right foot -Wound cleansed and minimally debrided. -Puraply XT graft 4.91x4.91 lot [id number] exp 08/02/21. Secondary dressing of mepitel, ABD, cast padding, Coban. 50% of graft was wasted.   No follow-ups on file.

## 2020-10-27 ENCOUNTER — Encounter: Payer: Self-pay | Admitting: Family Medicine

## 2020-10-27 ENCOUNTER — Encounter: Payer: Self-pay | Admitting: Podiatry

## 2020-10-29 ENCOUNTER — Ambulatory Visit (INDEPENDENT_AMBULATORY_CARE_PROVIDER_SITE_OTHER): Payer: BC Managed Care – PPO | Admitting: Podiatry

## 2020-10-29 ENCOUNTER — Other Ambulatory Visit: Payer: Self-pay

## 2020-10-29 DIAGNOSIS — L97413 Non-pressure chronic ulcer of right heel and midfoot with necrosis of muscle: Secondary | ICD-10-CM

## 2020-10-29 DIAGNOSIS — E11621 Type 2 diabetes mellitus with foot ulcer: Secondary | ICD-10-CM | POA: Diagnosis not present

## 2020-10-29 DIAGNOSIS — M86179 Other acute osteomyelitis, unspecified ankle and foot: Secondary | ICD-10-CM

## 2020-10-29 MED ORDER — OXYCODONE-ACETAMINOPHEN 5-325 MG PO TABS: 1.0000 | ORAL_TABLET | ORAL | 0 refills | Status: DC | PRN

## 2020-10-29 MED ORDER — AMOXICILLIN-POT CLAVULANATE 875-125 MG PO TABS: 1.0000 | ORAL_TABLET | Freq: Two times a day (BID) | ORAL | 1 refills | Status: DC

## 2020-10-29 NOTE — Progress Notes (Signed)
  Subjective:  Patient ID: Austin Shaw, male    DOB: 1965-11-16,  MRN: [id number]  Chief Complaint  Patient presents with   Follow-up    POV #2 DOS 10/15/2020 RT FOOOT WOUND DEBRIDEMENT W/APPLICATION OF SKIN GRAFT SUSTITUTE   DOS: 07/21/20 Procedure: Right foot 1st toe amputation  DOS: 07/23/20 Procedure: Right foot delayed wound closure with partial metatarsal resection  DOS: 10/15/20 Procedure: Right foot debridement and irrigation, application of skin graft substitute  55 y.o. male presents with the above complaint. History confirmed with patient.   Objective:  Physical Exam: no tenderness at the surgical site, local edema noted and calf supple, nontender. Incision: right foot wound improved measuring approx 2*0.5, 1.5x0.5 with skin bridge proximally. No warmth, erythema, SOI. Assessment:   1. Diabetic ulcer of right midfoot associated with type 2 diabetes mellitus, with necrosis of muscle (HCC)    Plan:  Patient was evaluated and treated and all questions answered.  Ulcer right foot -Wound appears slightly smaller -Wound cleansed and debrided to bleeding wound base -Puraply XT lot [id number] Exp 08/02/21. Secondary dressing of ABD, 4x4, kerlix, and Coban -Leave intact 1 week.  No follow-ups on file.

## 2020-11-05 ENCOUNTER — Ambulatory Visit (INDEPENDENT_AMBULATORY_CARE_PROVIDER_SITE_OTHER): Payer: BC Managed Care – PPO | Admitting: Podiatry

## 2020-11-05 ENCOUNTER — Other Ambulatory Visit: Payer: Self-pay

## 2020-11-05 ENCOUNTER — Encounter: Payer: BC Managed Care – PPO | Admitting: Podiatry

## 2020-11-05 DIAGNOSIS — L97413 Non-pressure chronic ulcer of right heel and midfoot with necrosis of muscle: Secondary | ICD-10-CM

## 2020-11-05 DIAGNOSIS — E11621 Type 2 diabetes mellitus with foot ulcer: Secondary | ICD-10-CM

## 2020-11-05 NOTE — Progress Notes (Signed)
  Subjective:  Patient ID: Austin Shaw, male    DOB: Nov 30, 1965,  MRN: [id number]  Chief Complaint  Patient presents with   Routine Post Op    POV#3 Graft application   DOS: 07/21/20 Procedure: Right foot 1st toe amputation  DOS: 07/23/20 Procedure: Right foot delayed wound closure with partial metatarsal resection  DOS: 10/15/20 Procedure: Right foot debridement and irrigation, application of skin graft substitute  55 y.o. male presents with the above complaint. History confirmed with patient.   Objective:  Physical Exam: no tenderness at the surgical site, local edema noted and calf supple, nontender. Incision: right foot wound improved measuring approx 2*0.5, 1.5*0.5 proximal to distal with skin bridge proximally. No warmth, erythema, SOI. Assessment:   1. Diabetic ulcer of right midfoot associated with type 2 diabetes mellitus, with necrosis of muscle (HCC)    Plan:  Patient was evaluated and treated and all questions answered.  Ulcer right foot -Wound similar in size to last visit. -Wound cleansed and debrided -Skin graft substitute applied to promote healing -Dressing applied consisting of sterile gauze, kerlix, ABD PAD, and ACE bandage  Procedure: Application Skin Graft Substitute Rationale: Wound in need of advanced wound therapy to accelerate healing Pre-Debridement Wound Measurements: 2*0.5, 1.5*0.5  Post-Debridement Wound Measurements: same as pre-debridement. Type of Debridement: Selective Tissue Removed: Devitalized soft-tissue Instrumentation: 3 mm dermal curette Skin substitute: Puraply AM    Lot #: [id number]    Expiration: 01/11/23 Hydration: graft hydrated with saline Secondary Dressing: mepitel Disposition: Patient tolerated procedure well. Patient to return in 1 week for follow-up.   No follow-ups on file.

## 2020-11-09 ENCOUNTER — Telehealth: Payer: Self-pay | Admitting: Family Medicine

## 2020-11-09 ENCOUNTER — Encounter: Payer: Self-pay | Admitting: Podiatry

## 2020-11-09 ENCOUNTER — Telehealth: Payer: Self-pay | Admitting: Urology

## 2020-11-09 NOTE — Telephone Encounter (Signed)
DOS - 11/19/20  WOUND DEBRIDEMENT RIGHT WITH APPLICATION OF SKIN GRAFT --- [phone].   BCBS EFFECTIVE DATE - 04/10/20   PLAN DEDUCTIBLE - $1,250.00 W/ $0.00 REMAINING OUT OF POCKET - $4,890.00 W/ $0.00 REMAINING COINSURANCE - 20% COPAY - $0.00   NO PRIOR AUTH REQUIRED

## 2020-11-09 NOTE — Telephone Encounter (Signed)
Pt wife came into office to drop off Surgery Clearance form. Placed in provider box.

## 2020-11-10 NOTE — Telephone Encounter (Signed)
Form has been placed in your inbox.

## 2020-11-10 NOTE — Telephone Encounter (Signed)
I am out of the office.  I will work on the hardcopy when possible.  Thanks.

## 2020-11-12 ENCOUNTER — Other Ambulatory Visit: Payer: Self-pay

## 2020-11-12 ENCOUNTER — Ambulatory Visit (INDEPENDENT_AMBULATORY_CARE_PROVIDER_SITE_OTHER): Payer: BC Managed Care – PPO | Admitting: Podiatry

## 2020-11-12 DIAGNOSIS — L97413 Non-pressure chronic ulcer of right heel and midfoot with necrosis of muscle: Secondary | ICD-10-CM | POA: Diagnosis not present

## 2020-11-12 DIAGNOSIS — E11621 Type 2 diabetes mellitus with foot ulcer: Secondary | ICD-10-CM

## 2020-11-12 NOTE — Progress Notes (Signed)
  Subjective:  Patient ID: Austin Shaw, male    DOB: 09/09/1965,  MRN: [id number]  Chief Complaint  Patient presents with   Routine Post Op    POV #4 DOS 10/15/2020 RT FOOOT WOUND DEBRIDEMENT W/APPLICATION OF SKIN GRAFT SUSTITUTE   DOS: 07/21/20 Procedure: Right foot 1st toe amputation  DOS: 07/23/20 Procedure: Right foot delayed wound closure with partial metatarsal resection  DOS: 10/15/20 Procedure: Right foot debridement and irrigation, application of skin graft substitute  55 y.o. male presents with the above complaint. History confirmed with patient. Thinks the wound is doing ok but has odor to his foot.  Objective:  Physical Exam: no tenderness at the surgical site, local edema noted and calf supple, nontender. Incision: right foot wound improved measuring1*0.6, 0.7x2 proximal to distal with skin bridge proximally. No warmth, erythema, SOI. Assessment:   1. Diabetic ulcer of right midfoot associated with type 2 diabetes mellitus, with necrosis of muscle (HCC)    Plan:  Patient was evaluated and treated and all questions answered.  Ulcer right foot -Wound improving -Repeat application of skin substitute today -Pending debridement, repair, and possible skin substitute next week.  Procedure: Application Skin Graft Substitute Rationale: Wound in need of advanced wound therapy to accelerate healing Pre-Debridement Wound Measurements: 1*0.6, 0.7*2 with skin bridge  Post-Debridement Wound Measurements: same as pre-debridement. Skin substitute: Puraply AM    Lot #: [id number]    Expiration: 01/26/23 Hydration: graft hydrated with saline Secondary Dressing: mepitel, sterile gauze, kerlix, ABD PAD, and coban Disposition: Patient tolerated procedure well. Patient to return in 1 week for follow-up.  No follow-ups on file.  

## 2020-11-12 NOTE — H&P (View-Only) (Signed)
  Subjective:  Patient ID: Austin Shaw, male    DOB: [birth year],  MRN: [id number]  Chief Complaint  Patient presents with   Routine Post Op    POV #4 DOS 10/15/2020 RT FOOOT WOUND DEBRIDEMENT W/APPLICATION OF SKIN GRAFT SUSTITUTE   DOS: 07/21/20 Procedure: Right foot 1st toe amputation  DOS: 07/23/20 Procedure: Right foot delayed wound closure with partial metatarsal resection  DOS: 10/15/20 Procedure: Right foot debridement and irrigation, application of skin graft substitute  55 y.o. male presents with the above complaint. History confirmed with patient. Thinks the wound is doing ok but has odor to his foot.  Objective:  Physical Exam: no tenderness at the surgical site, local edema noted and calf supple, nontender. Incision: right foot wound improved measuring1*0.6, 0.7x2 proximal to distal with skin bridge proximally. No warmth, erythema, SOI. Assessment:   1. Diabetic ulcer of right midfoot associated with type 2 diabetes mellitus, with necrosis of muscle (HCC)    Plan:  Patient was evaluated and treated and all questions answered.  Ulcer right foot -Wound improving -Repeat application of skin substitute today -Pending debridement, repair, and possible skin substitute next week.  Procedure: Application Skin Graft Substitute Rationale: Wound in need of advanced wound therapy to accelerate healing Pre-Debridement Wound Measurements: 1*0.6, 0.7*2 with skin bridge  Post-Debridement Wound Measurements: same as pre-debridement. Skin substitute: Puraply AM    Lot #: [id number]    Expiration: 01/26/23 Hydration: graft hydrated with saline Secondary Dressing: mepitel, sterile gauze, kerlix, ABD PAD, and coban Disposition: Patient tolerated procedure well. Patient to return in 1 week for follow-up.  No follow-ups on file.

## 2020-11-18 ENCOUNTER — Other Ambulatory Visit: Payer: Self-pay

## 2020-11-18 ENCOUNTER — Encounter (HOSPITAL_COMMUNITY): Payer: Self-pay | Admitting: Podiatry

## 2020-11-18 NOTE — Progress Notes (Signed)
Anesthesia Chart Review: Austin Shaw  Case: [id number] Date/Time: 11/19/20 0659   Procedures:      DEBRIDEMENT WOUND (Right)     GRAFT APPLICATION (Right)   Anesthesia type: Choice   Pre-op diagnosis: DIABETIC ULCER OF RIGHT FOOT   Location: [facility] OR ROOM 09 / [place] [facility]   Surgeons: Evelina Bucy, DPM       DISCUSSION: Patient is a 55 year old male scheduled for the above procedure. Diabetic foot infection with right great toe osteomyelitis, s/p right great toe amputation 07/21/20 (cultures + E. Coli and Streptococcus agalactiae). This was followed by debridement with delayed wound closure on 07/23/20, I&D right foot wound with partial closure and application of skin graft substitute on 09/17/20, and I&D of right foot to fascial tissue 10/15/20. ID Dr. Tommy Medal has been following with initial infection being treated with cefazolin and metronidazole, and later switched to IV ceftriaxone and oral Augmentin. At 11/12/20 visit with Dr. Faith Rogue, right foot ulcer wound improving, repeat application of skin substitute done, plan for debridement, repair, and possible skin substitute next week, scheduled for 11/19/20.    He has an evaluation with exam by ID Dr. Tommy Medal within the past 30 days, see 10/21/20 note that notes potential need for another graft. PCP Dr. Damita Dunnings also completed a form on 11/14/20 indicating he would not delay podiatry treatment.   Other history includes never smoker, DM2, HLD (statin intolerant, drug-induced myopathy), HTN, CHF (systolic CHF 10/3426 EF 76-81%, diffuse LV hypokinesis-->50-55%, normal wall motion 11/2014), PAD (s/p orbital atherectomy balloon angioplasty of the right TP trunk and peroneal artery & angioplasty distal right popliteal artery 07/14/20; right great toe amputation 07/21/20), OSA (not using CPAP since tonsillectomy), morbid obesity.    Patient is on Plavix since 07/14/20 right TP trunk, peroneal and popliteal artery interventions. Perioperative instructions per surgeons--this will  be the 5th procedure since then.  Anesthesia team to evaluate on the day of surgery.    VS: Recorded WT 10/21/20: 151 KG.  BP Readings from Last 3 Encounters:  10/21/20 (!) 188/98  10/15/20 (!) 155/86  10/04/20 (!) 142/90   Pulse Readings from Last 3 Encounters:  10/21/20 79  10/15/20 62  10/04/20 77     PROVIDERS: Tonia Ghent, MD is PCP, established since 03/2014. Last visit 10/06/20 for DM and HLD follow-up. A1c improved to 7% (from 12.1% 07/08/20). He did not tolerate statin therapy, so discussed getting his re-established with cardiology due to HLD with statin intolerance and known CHF history.  Kathlyn Sacramento, MD is [facility] cardiologist. He had previous cardiology evaluation in 2016-2017 by Truitt Merle, NP/[place], Marcello Moores, MD for new LV dysfunction, unspecified etiology. Initially EF responded quickly to medical therapy, but then had issues with non-compliance after his breathing and tachycardia improved. Last visit 11/12/15 He does remain on Coreg, hydralazine, Lasix as needed, and losartan.Alcide Evener, MD is ID   LABS: For labs as indicated on the day of surgery. He does have results from 10/21/20 which include: Lab Results  Component Value Date   WBC 8.3 10/21/2020   HGB 12.6 (L) 10/21/2020   HCT 39.4 10/21/2020   PLT 266 10/21/2020   GLUCOSE 143 (H) 10/21/2020   ALT 17 07/22/2020   AST 19 07/22/2020   NA 139 10/21/2020   K 4.5 10/21/2020   CL 103 10/21/2020   CREATININE 1.22 10/21/2020   BUN 22 10/21/2020   CO2 28 10/21/2020   HGBA1C 7.0 (A)  10/04/2020    EKG: 07/08/20 ([facility]): Normal sinus rhythm Moderate voltage criteria for LVH, may be normal variant   CV: RLE Arterial Duplex 07/21/20: Summary:  Right: 30-49% stenosis noted in the popliteal artery. 50-74% stenosis  noted in the TP trunk. Monophasic waveforms in the DPA, PTA, and Pero A.    Abd aortogram with LE runoff 07/14/20: 1.  No significant aortoiliac disease. 2.  Right  lower extremity: No significant SFA disease, borderline significant distal popliteal artery stenosis followed by an occluded TP trunk into the proximal portion of the peroneal artery.  Occluded anterior tibial artery at its origin with reconstitution in the mid calf.  Occluded posterior tibial artery at its origin with reconstitution in the distal calf. 3.  Successful orbital atherectomy and balloon angioplasty of the right TP trunk into the peroneal artery as well drug-coated balloon angioplasty of the distal popliteal artery.   Recommendations: Dual antiplatelet therapy for few months. Aggressive treatment of risk factors. Suspect good improvement in blood flow with today's procedure given excellent collaterals to the anterior and posterior tibial artery distally.  However, if healing is not optimal, we might need to consider retrograde revascularization of the anterior tibial artery.   Echo 11/12/14: Study Conclusions  - Left ventricle: Inferobasal hypokinesis. The cavity size was    mildly dilated. Wall thickness was increased in a pattern of    moderate LVH. Systolic function was normal. The estimated    ejection fraction was in the range of 50% to 55%. Wall motion was    normal; there were no regional wall motion abnormalities. Doppler    parameters are consistent with abnormal left ventricular    relaxation (grade 1 diastolic dysfunction).  - Atrial septum: No defect or patent foramen ovale was identified.   Past Medical History:  Diagnosis Date   Acute osteomyelitis of toe ([place]) 08/26/2020   Allergy    Diabetes mellitus    type 2   E coli infection 09/20/2020   Group B streptococcal infection 09/20/2020   Hyperlipidemia    Hypertension    Morbid obesity (HCC)    BMI 50   Sleep apnea    pt states he no longer has sleep apnea since tonsillectomy    Past Surgical History:  Procedure Laterality Date   ABDOMINAL AORTOGRAM W/LOWER EXTREMITY N/A 07/14/2020   Procedure: ABDOMINAL  AORTOGRAM W/LOWER EXTREMITY;  Surgeon: Wellington Hampshire, MD;  Location: [place] [facility];  Service: Cardiovascular;  Laterality: N/A;   AMPUTATION Right 07/21/2020   Procedure: AMPUTATION RIGHT GREAT TOE;  Surgeon: Evelina Bucy, DPM;  Location: [place];  Service: Podiatry;  Laterality: Right;   IRRIGATION AND DEBRIDEMENT FOOT Right 07/21/2020   Procedure: IRRIGATION AND DEBRIDEMENT RIGHT GREAT TOE;  Surgeon: Evelina Bucy, DPM;  Location: [place];  Service: Podiatry;  Laterality: Right;   METATARSAL HEAD EXCISION Right 07/23/2020   Procedure: METATARSAL HEAD EXCISION;  Surgeon: Evelina Bucy, DPM;  Location: [place];  Service: Podiatry;  Laterality: Right;   TONSILLECTOMY     WOUND DEBRIDEMENT Right 07/23/2020   Procedure: DEBRIDEMENT WOUND WITH DELAYED WOUND CLOSURE;  Surgeon: Evelina Bucy, DPM;  Location: [place];  Service: Podiatry;  Laterality: Right;   WOUND DEBRIDEMENT Right 09/17/2020   Procedure: DEBRIDEMENT OF RIGHT FOOT WOUND, PARTIAL WOUND CLOSURE, APPLICATION OF SKIN GRAFT SUBSTITUTE;  Surgeon: Evelina Bucy, DPM;  Location: [place];  Service: Podiatry;  Laterality: Right;   WOUND DEBRIDEMENT Right 10/15/2020   Procedure: DEBRIDEMENT WOUND  with Application of skin graft substitute;  Surgeon: Evelina Bucy, DPM;  Location: [place];  Service: Podiatry;  Laterality: Right;    MEDICATIONS: No current facility-administered medications for this encounter.    amoxicillin-clavulanate (AUGMENTIN) 875-125 MG tablet   carvedilol (COREG) 12.5 MG tablet   clopidogrel (PLAVIX) 75 MG tablet   Dulaglutide (TRULICITY) 2.48 GN/0.0BB SOPN   furosemide (LASIX) 40 MG tablet   hydrALAZINE (APRESOLINE) 25 MG tablet   losartan (COZAAR) 100 MG tablet   metFORMIN (GLUCOPHAGE) 500 MG tablet   oxyCODONE-acetaminophen (PERCOCET) 5-325 MG tablet   tobramycin (TOBREX) 0.3 % ophthalmic solution   Blood Glucose Monitoring Suppl (ONE TOUCH ULTRA SYSTEM KIT) w/Device KIT   cephALEXin (KEFLEX) 500 MG  capsule   Continuous Blood Gluc Receiver (FREESTYLE LIBRE 14 DAY READER) DEVI   Continuous Blood Gluc Sensor (FREESTYLE LIBRE 14 DAY SENSOR) MISC   glucose blood test strip    Myra Gianotti, PA-C Surgical Short Stay/Anesthesiology [facility] Phone [phone] [facility] Phone [phone] 11/18/2020 2:33 PM

## 2020-11-18 NOTE — Progress Notes (Signed)
Austin Shaw asked me to speak to his wife regarding medications and history and give her instructions.  Austin Shaw is the designated party to speak to.     Austin Shaw has not had complaints of chest pain or shortness of breath. Austin Shaw denies having any s/s of Covid in her household.  Austin Shaw denies any known exposure to Covid.           Austin Shaw has type II diabetes, Austin Shaw has a Free Style glucose reader. Austin Shaw reports that CBGs run 90- 132.  I gave I instructions Austin Shaw for Austin Shaw to hold Metformin in am.I instructed Austin Shaw to check CBG after awaking and every 2 hours until arrival  to the hospital.  I Instructed Austin Shaw that if CBG is less than 70 to take 4 Glucose Tablets or 1 tube of Glucose Gel or 1/2 cup of a clear juice. Recheck CBG in 15 minutes if CBG is not over 70 call, pre- op desk at [phone] for further instructions.

## 2020-11-18 NOTE — Anesthesia Preprocedure Evaluation (Addendum)
Anesthesia Evaluation  Patient identified by MRN, date of birth, ID band Patient awake    Reviewed: Allergy & Precautions, NPO status , Patient's Chart, lab work & pertinent test results, reviewed documented beta blocker date and time   Airway Mallampati: III  TM Distance: >3 FB Neck ROM: Full    Dental  (+) Dental Advisory Given, Teeth Intact   Pulmonary sleep apnea (denies since having tonsillectomy) ,    Pulmonary exam normal breath sounds clear to auscultation       Cardiovascular hypertension (poorly controlled HTN- 200 SBP in preop), Pt. on medications and Pt. on home beta blockers + Peripheral Vascular Disease (plavix) and +CHF (grade 1 diastolic dysfunction)  Normal cardiovascular exam Rhythm:Regular Rate:Normal  Echo 2016: - Left ventricle: Inferobasal hypokinesis. The cavity size was  mildly dilated. Wall thickness was increased in a pattern of  moderate LVH. Systolic function was normal. The estimated  ejection fraction was in the range of 50% to 55%. Wall motion was  normal; there were no regional wall motion abnormalities. Doppler  parameters are consistent with abnormal left ventricular  relaxation (grade 1 diastolic dysfunction).  - Atrial septum: No defect or patent foramen ovale was identified.    Neuro/Psych negative neurological ROS  negative psych ROS   GI/Hepatic negative GI ROS, Neg liver ROS,   Endo/Other  diabetes, Well Controlled, Type 2, Oral Hypoglycemic AgentsMorbid obesityBMI 48 a1c 7.0  Renal/GU negative Renal ROS  negative genitourinary   Musculoskeletal Diabetic ulcer R foot   Abdominal (+) + obese,   Peds negative pediatric ROS (+)  Hematology negative hematology ROS (+)   Anesthesia Other Findings   Reproductive/Obstetrics negative OB ROS                          Anesthesia Physical Anesthesia Plan  ASA: 3  Anesthesia Plan: MAC   Post-op  Pain Management:    Induction:   PONV Risk Score and Plan: 2 and Propofol infusion and TIVA  Airway Management Planned: Natural Airway and Simple Face Mask  Additional Equipment: None  Intra-op Plan:   Post-operative Plan:   Informed Consent: I have reviewed the patients History and Physical, chart, labs and discussed the procedure including the risks, benefits and alternatives for the proposed anesthesia with the patient or authorized representative who has indicated his/her understanding and acceptance.       Plan Discussed with: CRNA  Anesthesia Plan Comments: (Tolerated MAC last mont with similar surgery, DPM to do nerve block in room)       Anesthesia Quick Evaluation

## 2020-11-19 ENCOUNTER — Ambulatory Visit (HOSPITAL_COMMUNITY)
Admission: RE | Admit: 2020-11-19 | Discharge: 2020-11-19 | Disposition: A | Discharge status: Home / Self Care | Payer: BC Managed Care – PPO | Attending: Podiatry | Admitting: Podiatry

## 2020-11-19 ENCOUNTER — Ambulatory Visit (HOSPITAL_COMMUNITY): Payer: BC Managed Care – PPO | Admitting: Vascular Surgery

## 2020-11-19 ENCOUNTER — Telehealth: Payer: Self-pay | Admitting: Podiatry

## 2020-11-19 ENCOUNTER — Encounter (HOSPITAL_COMMUNITY): Payer: Self-pay | Admitting: Podiatry

## 2020-11-19 ENCOUNTER — Other Ambulatory Visit: Payer: Self-pay

## 2020-11-19 ENCOUNTER — Encounter (HOSPITAL_COMMUNITY)
Admission: RE | Disposition: A | Discharge status: Home / Self Care | Payer: Self-pay | Source: Home / Self Care | Attending: Podiatry

## 2020-11-19 DIAGNOSIS — I110 Hypertensive heart disease with heart failure: Secondary | ICD-10-CM | POA: Insufficient documentation

## 2020-11-19 DIAGNOSIS — Z9862 Peripheral vascular angioplasty status: Secondary | ICD-10-CM | POA: Diagnosis not present

## 2020-11-19 DIAGNOSIS — I5020 Unspecified systolic (congestive) heart failure: Secondary | ICD-10-CM | POA: Diagnosis not present

## 2020-11-19 DIAGNOSIS — Z6841 Body Mass Index (BMI) 40.0 and over, adult: Secondary | ICD-10-CM | POA: Diagnosis not present

## 2020-11-19 DIAGNOSIS — Z89411 Acquired absence of right great toe: Secondary | ICD-10-CM | POA: Insufficient documentation

## 2020-11-19 DIAGNOSIS — Z79891 Long term (current) use of opiate analgesic: Secondary | ICD-10-CM | POA: Insufficient documentation

## 2020-11-19 DIAGNOSIS — E1151 Type 2 diabetes mellitus with diabetic peripheral angiopathy without gangrene: Secondary | ICD-10-CM | POA: Insufficient documentation

## 2020-11-19 DIAGNOSIS — E785 Hyperlipidemia, unspecified: Secondary | ICD-10-CM | POA: Diagnosis not present

## 2020-11-19 DIAGNOSIS — E11621 Type 2 diabetes mellitus with foot ulcer: Secondary | ICD-10-CM

## 2020-11-19 DIAGNOSIS — L97519 Non-pressure chronic ulcer of other part of right foot with unspecified severity: Secondary | ICD-10-CM

## 2020-11-19 DIAGNOSIS — Z7984 Long term (current) use of oral hypoglycemic drugs: Secondary | ICD-10-CM | POA: Diagnosis not present

## 2020-11-19 HISTORY — PX: WOUND DEBRIDEMENT: SHX247

## 2020-11-19 HISTORY — PX: GRAFT APPLICATION: SHX6696

## 2020-11-19 HISTORY — DX: Peripheral vascular disease, unspecified: I73.9

## 2020-11-19 HISTORY — DX: Heart failure, unspecified: I50.9

## 2020-11-19 LAB — GLUCOSE, CAPILLARY
Glucose-Capillary: 125 mg/dL — ABNORMAL HIGH (ref 70–99)
Glucose-Capillary: 121 mg/dL — ABNORMAL HIGH (ref 70–99)

## 2020-11-19 SURGERY — DEBRIDEMENT, WOUND
Anesthesia: Choice | Laterality: Right

## 2020-11-19 MED ORDER — CEFAZOLIN SODIUM-DEXTROSE 2-4 GM/100ML-% IV SOLN
2.0000 g | INTRAVENOUS | Status: AC
  Administered 2020-11-19: 3 g via INTRAVENOUS
  Filled 2020-11-19: qty 100

## 2020-11-19 MED ORDER — CHLORHEXIDINE GLUCONATE 0.12 % MT SOLN
15.0000 mL | Freq: Once | OROMUCOSAL | Status: AC
  Administered 2020-11-19: 15 mL via OROMUCOSAL
  Filled 2020-11-19: qty 15

## 2020-11-19 MED ORDER — PROPOFOL 10 MG/ML IV BOLUS
INTRAVENOUS | Status: AC
  Filled 2020-11-19: qty 20

## 2020-11-19 MED ORDER — FENTANYL CITRATE (PF) 250 MCG/5ML IJ SOLN
INTRAMUSCULAR | Status: AC
  Filled 2020-11-19: qty 5

## 2020-11-19 MED ORDER — BUPIVACAINE HCL (PF) 0.5 % IJ SOLN
INTRAMUSCULAR | Status: DC | PRN
  Administered 2020-11-19: 10 mL

## 2020-11-19 MED ORDER — VANCOMYCIN HCL 1000 MG IV SOLR
INTRAVENOUS | Status: AC
  Filled 2020-11-19: qty 1000

## 2020-11-19 MED ORDER — CEPHALEXIN 500 MG PO CAPS: ORAL_CAPSULE | ORAL | 0 refills | Status: DC

## 2020-11-19 MED ORDER — SODIUM CHLORIDE 0.9 % IR SOLN
Status: DC | PRN
  Administered 2020-11-19: 1000 mL

## 2020-11-19 MED ORDER — OXYCODONE-ACETAMINOPHEN 5-325 MG PO TABS: 1.0000 | ORAL_TABLET | ORAL | 0 refills | Status: DC | PRN

## 2020-11-19 MED ORDER — LACTATED RINGERS IV SOLN: INTRAVENOUS | Status: DC

## 2020-11-19 MED ORDER — MIDAZOLAM HCL 2 MG/2ML IJ SOLN
INTRAMUSCULAR | Status: DC | PRN
  Administered 2020-11-19: 2 mg via INTRAVENOUS

## 2020-11-19 MED ORDER — CEFAZOLIN SODIUM 1 G IJ SOLR
INTRAMUSCULAR | Status: AC
  Filled 2020-11-19: qty 10

## 2020-11-19 MED ORDER — 0.9 % SODIUM CHLORIDE (POUR BTL) OPTIME
TOPICAL | Status: DC | PRN
  Administered 2020-11-19: 1000 mL

## 2020-11-19 MED ORDER — MIDAZOLAM HCL 2 MG/2ML IJ SOLN
INTRAMUSCULAR | Status: AC
  Filled 2020-11-19: qty 2

## 2020-11-19 MED ORDER — ORAL CARE MOUTH RINSE: 15.0000 mL | Freq: Once | OROMUCOSAL | Status: AC

## 2020-11-19 MED ORDER — ACETAMINOPHEN 500 MG PO TABS: 1000.0000 mg | ORAL_TABLET | Freq: Once | ORAL | Status: DC

## 2020-11-19 MED ORDER — BUPIVACAINE HCL (PF) 0.5 % IJ SOLN
INTRAMUSCULAR | Status: AC
  Filled 2020-11-19: qty 30

## 2020-11-19 MED ORDER — DEXMEDETOMIDINE (PRECEDEX) IN NS 20 MCG/5ML (4 MCG/ML) IV SYRINGE
PREFILLED_SYRINGE | INTRAVENOUS | Status: DC | PRN
  Administered 2020-11-19 (×2): 8 ug via INTRAVENOUS

## 2020-11-19 SURGICAL SUPPLY — 42 items
BAG COUNTER SPONGE SURGICOUNT (BAG) ×2 IMPLANT
BNDG ELASTIC 4X5.8 VLCR STR LF (GAUZE/BANDAGES/DRESSINGS) ×2 IMPLANT
BNDG ESMARK 4X9 LF (GAUZE/BANDAGES/DRESSINGS) IMPLANT
BNDG GAUZE ELAST 4 BULKY (GAUZE/BANDAGES/DRESSINGS) ×2 IMPLANT
CHLORAPREP W/TINT 26 (MISCELLANEOUS) ×2 IMPLANT
COVER SURGICAL LIGHT HANDLE (MISCELLANEOUS) ×2 IMPLANT
CUFF TOURN SGL QUICK 18X4 (TOURNIQUET CUFF) IMPLANT
CUFF TOURN SGL QUICK 34 (TOURNIQUET CUFF)
CUFF TRNQT CYL 34X4.125X (TOURNIQUET CUFF) IMPLANT
DRAPE U-SHAPE 47X51 STRL (DRAPES) ×2 IMPLANT
DRSG MEPITEL 4X7.2 (GAUZE/BANDAGES/DRESSINGS) ×2 IMPLANT
ELECT CAUTERY BLADE 6.4 (BLADE) ×2 IMPLANT
ELECT REM PT RETURN 9FT ADLT (ELECTROSURGICAL) ×2
ELECTRODE REM PT RTRN 9FT ADLT (ELECTROSURGICAL) ×1 IMPLANT
GAUZE SPONGE 4X4 12PLY STRL (GAUZE/BANDAGES/DRESSINGS) ×2 IMPLANT
GLOVE SRG 8 PF TXTR STRL LF DI (GLOVE) ×1 IMPLANT
GLOVE SURG ENC MOIS LTX SZ7.5 (GLOVE) ×2 IMPLANT
GLOVE SURG UNDER POLY LF SZ8 (GLOVE) ×1
GOWN STRL REUS W/ TWL LRG LVL3 (GOWN DISPOSABLE) ×1 IMPLANT
GOWN STRL REUS W/ TWL XL LVL3 (GOWN DISPOSABLE) ×1 IMPLANT
GOWN STRL REUS W/TWL LRG LVL3 (GOWN DISPOSABLE) ×1
GOWN STRL REUS W/TWL XL LVL3 (GOWN DISPOSABLE) ×1
KIT BASIN OR (CUSTOM PROCEDURE TRAY) ×2 IMPLANT
KIT TURNOVER KIT B (KITS) ×2 IMPLANT
MANIFOLD NEPTUNE II (INSTRUMENTS) ×2 IMPLANT
MATRIX PURAPLY AM COLLAGEN 5X5 (Tissue) ×1 IMPLANT
NEEDLE BIOPSY JAMSHIDI 8X6 (NEEDLE) IMPLANT
NEEDLE HYPO 25GX1X1/2 BEV (NEEDLE) IMPLANT
NS IRRIG 1000ML POUR BTL (IV SOLUTION) ×2 IMPLANT
PACK ORTHO EXTREMITY (CUSTOM PROCEDURE TRAY) ×2 IMPLANT
PAD ARMBOARD 7.5X6 YLW CONV (MISCELLANEOUS) ×4 IMPLANT
PROBE DEBRIDE SONICVAC MISONIX (TIP) IMPLANT
PURAPLY AM COLLAGEN 5X5 25SQ (Tissue) ×2 IMPLANT
SET CYSTO W/LG BORE CLAMP LF (SET/KITS/TRAYS/PACK) ×2 IMPLANT
SOL PREP POV-IOD 4OZ 10% (MISCELLANEOUS) ×4 IMPLANT
STAPLER VISISTAT 35W (STAPLE) ×2 IMPLANT
SUT ETHILON 2 0 FS 18 (SUTURE) ×2 IMPLANT
SYR CONTROL 10ML LL (SYRINGE) IMPLANT
TOWEL GREEN STERILE (TOWEL DISPOSABLE) ×2 IMPLANT
TOWEL GREEN STERILE FF (TOWEL DISPOSABLE) ×2 IMPLANT
TUBE CONNECTING 12X1/4 (SUCTIONS) ×2 IMPLANT
YANKAUER SUCT BULB TIP NO VENT (SUCTIONS) ×2 IMPLANT

## 2020-11-19 NOTE — Telephone Encounter (Signed)
Pt's wife called stating the pharmacy never received and Rx for the antibiotic. Please advise.

## 2020-11-19 NOTE — Anesthesia Procedure Notes (Signed)
Procedure Name: MAC Date/Time: 11/19/2020 7:24 AM Performed by: Reece Agar, CRNA Pre-anesthesia Checklist: Patient identified, Emergency Drugs available, Suction available, Patient being monitored and Timeout performed Patient Re-evaluated:Patient Re-evaluated prior to induction Oxygen Delivery Method: Simple face mask

## 2020-11-19 NOTE — Op Note (Signed)
Patient Name: Austin Shaw DOB: 1965/11/04  MRN: [id number]   Date of Service: 11/19/20 Surgeon: Dr. Hardie Pulley, DPM Assistants: None Pre-operative Diagnosis: ulcer right foot Post-operative Diagnosis: same Procedures:             1) Debridement of right foot wound / wound bed prep  2) Application of skin graft substitute Pathology/Specimens: * No specimens in log * Anesthesia: Mac/local Hemostasis: Anatomic Estimated Blood Loss: 5 ml Materials:  Implant Name Type Inv. Item Serial No. Manufacturer Lot No. LRB No. Used Action  PURAPLY-AM COLLAGEN 5X5 25SQ - SNA Tissue PURAPLY-AM COLLAGEN 5X5 25SQ NA [employer] [id number] Right 1 Implanted    Medications: 10 ml marcaine 3.4% plain Complications: None  Indications for Procedure:  This is a 55 y.o. male with a chronic right foot wound who presents for debridement and skin graft substitute to promote helaing.   Procedure in Detail: Patient was identified in pre-operative holding area. Formal consent was signed and the right lower extremity was marked. Patient was brought back to the operating room and placed on the operating room table in the supine position. Anesthesia was induced.   The extremity was prepped and draped in the usual sterile fashion. Timeout was taken to confirm patient name, laterality, and procedure prior to incision. Attention was then directed to the right foot where a wound measuring 2.5x1 was encountered.  The wound was sharply excisionally debrided with a 15 blade, followed by a misonix ultrasonic debrider. Debridement was performed to bleeding, viable wound base. The wound was debrided to the level of the fascial tissue. Following debridement the wound measured 3x1.  Puraply graft was placed into the wound - it was not hydrated with saline due to the healthy bleeding of the tissues which allowed the graft to saturate. The wound was then closed with 2-0 nylon.  The foot was then dressed with  mepitel 4x4, kerlix, and ACE bandage. Patient tolerated the procedure well.   Disposition: Following a period of post-operative monitoring, patient will be transferred back home.

## 2020-11-19 NOTE — Progress Notes (Signed)
BP elevated this morning: 193/80 when patient arrived in short stay and continue to be elevated - 210/89 Pulse 77. Patient had this morning Coreg 12.5 mg and Hydralazine 25 mg. Dr. Salvadore Farber was notified. Will continue to monitor.

## 2020-11-19 NOTE — Telephone Encounter (Signed)
Called patient's wife for post-op update 

## 2020-11-19 NOTE — Transfer of Care (Signed)
Immediate Anesthesia Transfer of Care Note  Patient: Austin Shaw  Procedure(s) Performed: DEBRIDEMENT WOUND (Right) GRAFT APPLICATION (Right)  Patient Location: PACU  Anesthesia Type:MAC  Level of Consciousness: awake  Airway & Oxygen Therapy: Patient Spontanous Breathing and Patient connected to face mask oxygen  Post-op Assessment: Report given to RN and Post -op Vital signs reviewed and stable  Post vital signs: Reviewed and stable  Last Vitals:  Vitals Value Taken Time  BP 190/93 11/19/20 0812  Temp    Pulse 61 11/19/20 0811  Resp 19 11/19/20 0811  SpO2 100 % 11/19/20 0811  Vitals shown include unvalidated device data.  Last Pain:  Vitals:   11/19/20 0618  TempSrc:   PainSc: 0-No pain         Complications: No notable events documented.

## 2020-11-19 NOTE — Addendum Note (Signed)
Addended by: Ventura Sellers on: 11/19/2020 11:13 AM   Modules accepted: Orders

## 2020-11-19 NOTE — Interval H&P Note (Signed)
History and Physical Interval Note:  11/19/2020 7:11 AM  Austin Shaw  has presented today for surgery, with the diagnosis of DIABETIC ULCER OF RIGHT FOOT.  The various methods of treatment have been discussed with the patient and family. After consideration of risks, benefits and other options for treatment, the patient has consented to  Procedure(s): DEBRIDEMENT WOUND (Right) GRAFT APPLICATION (Right) as a surgical intervention.  The patient's history has been reviewed, patient examined, no change in status, stable for surgery.  I have reviewed the patient's chart and labs.  Questions were answered to the patient's satisfaction.     [place]

## 2020-11-19 NOTE — Discharge Instructions (Signed)
  After Surgery Instructions   1) If you are recuperating from surgery anywhere other than home, please be sure to leave us the number where you can be reached.  2) Go directly home and rest.  3) Keep the operated foot(feet) elevated six inches above the hip when sitting or lying down. This will help control swelling and pain.  4) Support the elevated foot and leg with pillows. DO NOT PLACE PILLOWS UNDER THE KNEE.  5) DO NOT REMOVE or get your bandages WET, unless you were given different instructions by your doctor to do so. This increases the risk of infection.  6) Wear your surgical shoe or surgical boot at all times when you are up on your feet.  7) A limited amount of pain and swelling may occur. The skin may take on a bruised appearance. DO NOT BE ALARMED, THIS IS NORMAL.  8) For slight pain and swelling, apply an ice pack directly over the bandages for 15 minutes only out of each hour of the day. Continue until seen in the office for your first post op visit. DO NOT APPLY ANY FORM OF HEAT TO THE AREA.  9) Have prescriptions filled immediately and take as directed.  10) Drink lots of liquids, water and juice to stay hydrated.  11) CALL IMMEDIATELY IF:  *Bleeding continues until the following day of surgery  *Pain increases and/or does not respond to medication  *Bandages or cast appears to tight  *If your bandage gets wet  *Trip, fall or stump your surgical foot  *If your temperature goes above 101  *If you have ANY questions at all  12) You are expected to be weightbearing after your surgery.   If you need to reach the nurse for any reason, please call: [place]: [phone] [place]: [phone] [place]: [phone]  

## 2020-11-19 NOTE — Anesthesia Postprocedure Evaluation (Signed)
Anesthesia Post Note  Patient: Paige D Biagini  Procedure(s) Performed: DEBRIDEMENT WOUND (Right) GRAFT APPLICATION (Right)     Patient location during evaluation: PACU Anesthesia Type: MAC Level of consciousness: awake and alert Pain management: pain level controlled Vital Signs Assessment: post-procedure vital signs reviewed and stable Respiratory status: spontaneous breathing, nonlabored ventilation and respiratory function stable Cardiovascular status: blood pressure returned to baseline and stable Postop Assessment: no apparent nausea or vomiting Anesthetic complications: no   No notable events documented.  Last Vitals:  Vitals:   11/19/20 0812 11/19/20 0833  BP: (!) 190/93 (!) 162/73  Pulse: 60 61  Resp: 16 19  Temp: 36.7 C 36.7 C  SpO2: 100% 97%    Last Pain:  Vitals:   11/19/20 0812  TempSrc:   PainSc: 0-No pain                 Pervis Hocking

## 2020-11-22 ENCOUNTER — Encounter (HOSPITAL_COMMUNITY): Payer: Self-pay | Admitting: Podiatry

## 2020-11-24 ENCOUNTER — Other Ambulatory Visit: Payer: Self-pay

## 2020-11-24 ENCOUNTER — Telehealth: Payer: Self-pay

## 2020-11-24 ENCOUNTER — Other Ambulatory Visit (HOSPITAL_COMMUNITY): Payer: Self-pay

## 2020-11-24 ENCOUNTER — Encounter: Payer: Self-pay | Admitting: Infectious Disease

## 2020-11-24 ENCOUNTER — Ambulatory Visit (INDEPENDENT_AMBULATORY_CARE_PROVIDER_SITE_OTHER): Payer: BC Managed Care – PPO | Admitting: Infectious Disease

## 2020-11-24 VITALS — BP 170/90 | HR 69 | Wt 344.0 lb

## 2020-11-24 DIAGNOSIS — M86179 Other acute osteomyelitis, unspecified ankle and foot: Secondary | ICD-10-CM

## 2020-11-24 DIAGNOSIS — I5022 Chronic systolic (congestive) heart failure: Secondary | ICD-10-CM

## 2020-11-24 DIAGNOSIS — A498 Other bacterial infections of unspecified site: Secondary | ICD-10-CM

## 2020-11-24 DIAGNOSIS — A491 Streptococcal infection, unspecified site: Secondary | ICD-10-CM | POA: Diagnosis not present

## 2020-11-24 DIAGNOSIS — E118 Type 2 diabetes mellitus with unspecified complications: Secondary | ICD-10-CM

## 2020-11-24 NOTE — Telephone Encounter (Signed)
[facility] Patient Product/process development scientist completed.    The patient is insured through [employer].   [employer] Altes will need a PA.   We will continue to follow to see if copay assistance is needed.  Clearance Coots, CPhT Specialty Pharmacy Patient [facility] Phone: [phone] Fax:  [phone]

## 2020-11-24 NOTE — Progress Notes (Signed)
Subjective:  Chief complaint follow-up for diabetic foot ulcer  Patient ID: Austin Shaw, male    DOB: [birth year], 55 y.o.   MRN: [id number]  HPI  55 y.o. male with these mellitus peripheral vascular disease status post revascularization with gangrenous toe with osteomyelitis status post I&D of bursal collection and amputation of first toe then repeat trip to the OR for debridement and resection with cultures from both surgeries yielding group B streptococcus and E. coli that was fairly sensitive.  We  placed him on IV cefazolin along with oral metronidazole.   He had been taking both medications religiously including the metronidazole which she tries to take as quickly as possible to avoid the unpleasant taste gives and ones mouth.   I saw him last on May 19 that was encouraged by the appearance of his wound but discouraged that his inflammatory markers had increased.   We switched him from IV ceftriaxone and oral metronidazole to Augmentin.  Has been taking that since then.  Unfortunately his wound required further debridement and he underwent debridement irrigation of his right foot wound on the third June by Dr. March Rummage but with placement of skin graft over the wound apparently there have been concerned that there will be need for bone biopsy but this was not performed.  Then he had apparent failure of the graft and need for further debridement and placement of a new skin graft.  He otherwise feltmuch better occasionally with some phantom limb pain.   I last saw him he again has seen Dr. March Rummage to come to the operating room and debrided there is right foot wound and wound bed prep and then placed another skin graft substitute.  Inflammatory markers were still quite high when I saw him last his A1c is come down to 7.   Past Medical History:  Diagnosis Date   Acute osteomyelitis of toe ([place]) 08/26/2020   Allergy    CHF (congestive heart failure) ([place])    Diabetes mellitus     type 2   E coli infection 09/20/2020   Group B streptococcal infection 09/20/2020   Hyperlipidemia    Hypertension    Morbid obesity (HCC)    BMI 50   Peripheral vascular disease (HCC)    Sleep apnea    pt states he no longer has sleep apnea since tonsillectomy    Past Surgical History:  Procedure Laterality Date   ABDOMINAL AORTOGRAM W/LOWER EXTREMITY N/A 07/14/2020   Procedure: ABDOMINAL AORTOGRAM W/LOWER EXTREMITY;  Surgeon: Wellington Hampshire, MD;  Location: [place] [facility];  Service: Cardiovascular;  Laterality: N/A;   AMPUTATION Right 07/21/2020   Procedure: AMPUTATION RIGHT GREAT TOE;  Surgeon: Evelina Bucy, DPM;  Location: [place];  Service: Podiatry;  Laterality: Right;   GRAFT APPLICATION Right 9/52/8413   Procedure: GRAFT APPLICATION;  Surgeon: Evelina Bucy, DPM;  Location: [place];  Service: Podiatry;  Laterality: Right;   IRRIGATION AND DEBRIDEMENT FOOT Right 07/21/2020   Procedure: IRRIGATION AND DEBRIDEMENT RIGHT GREAT TOE;  Surgeon: Evelina Bucy, DPM;  Location: [place];  Service: Podiatry;  Laterality: Right;   METATARSAL HEAD EXCISION Right 07/23/2020   Procedure: METATARSAL HEAD EXCISION;  Surgeon: Evelina Bucy, DPM;  Location: [place];  Service: Podiatry;  Laterality: Right;   TONSILLECTOMY     WOUND DEBRIDEMENT Right 07/23/2020   Procedure: DEBRIDEMENT WOUND WITH DELAYED WOUND CLOSURE;  Surgeon: Evelina Bucy, DPM;  Location: [place];  Service: Podiatry;  Laterality: Right;  WOUND DEBRIDEMENT Right 09/17/2020   Procedure: DEBRIDEMENT OF RIGHT FOOT WOUND, PARTIAL WOUND CLOSURE, APPLICATION OF SKIN GRAFT SUBSTITUTE;  Surgeon: Evelina Bucy, DPM;  Location: [place];  Service: Podiatry;  Laterality: Right;   WOUND DEBRIDEMENT Right 10/15/2020   Procedure: DEBRIDEMENT WOUND with Application of skin graft substitute;  Surgeon: Evelina Bucy, DPM;  Location: [place];  Service: Podiatry;  Laterality: Right;   WOUND DEBRIDEMENT Right 11/19/2020   Procedure: DEBRIDEMENT  WOUND;  Surgeon: Evelina Bucy, DPM;  Location: [place];  Service: Podiatry;  Laterality: Right;    Family History  Problem Relation Age of Onset   Diabetes Father    Diabetes Other        fam hx 1st degree relative   Congestive Heart Failure Other    Lupus Daughter    Sjogren's syndrome Daughter    Colon cancer Neg Hx    Prostate cancer Neg Hx    Esophageal cancer Neg Hx    Rectal cancer Neg Hx       Social History   Socioeconomic History   Marital status: Married    Spouse name: Not on file   Number of children: Not on file   Years of education: Not on file   Highest education level: Not on file  Occupational History   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    4 kids   Married, 1998   From [place]   Social Determinants of Radio broadcast assistant Strain: Not on [employer] Insecurity: Not on file  Transportation Needs: Not on file  Physical Activity: Not on file  Stress: Not on file  Social Connections: Not on file    Allergies  Allergen Reactions   Lipitor [Atorvastatin] Other (See Comments)    Lightheaded & dizzy     Current Outpatient Medications:    amoxicillin-clavulanate (AUGMENTIN) 875-125 MG tablet, Take 1 tablet by mouth 2 (two) times daily., Disp: 60 tablet, Rfl: 1   Blood Glucose Monitoring Suppl (ONE TOUCH ULTRA SYSTEM KIT) w/Device KIT, Check blood sugar twice a day and as directed. Dx E11.8, Disp: 1 each, Rfl: 0   carvedilol (COREG) 12.5 MG tablet, Take 1 tablet (12.5 mg total) by mouth 2 (two) times daily with a meal., Disp: 180 tablet, Rfl: 0   cephALEXin (KEFLEX) 500 MG capsule, Take 1 tablet by mouth  today, then one tomorrow AM and one in PM (Patient not taking: Reported on 11/24/2020), Disp: 3 capsule, Rfl: 0   clopidogrel (PLAVIX) 75 MG tablet, Take 1 tablet (75 mg total) by mouth daily. (Patient taking differently: Take 75 mg by mouth in the morning.), Disp: 30 tablet, Rfl: 6   Continuous Blood Gluc Receiver (FREESTYLE LIBRE 14 DAY READER) DEVI, Use to check blood sugar daily. Dx : E11.9, Disp: 1 each, Rfl: 1   Continuous Blood Gluc Sensor (FREESTYLE LIBRE 14 DAY SENSOR) MISC, Use to check blood sugars daily. Dx: E11.9, Disp: 1 each, Rfl: 12   Dulaglutide (TRULICITY) 6.57 XU/3.8BF SOPN, Inject 0.75 mg into the skin once a week. Every  Friday in the evening (Patient taking differently: Inject 0.75 mg into the skin every Wednesday.), Disp: , Rfl:    furosemide (LASIX) 40 MG tablet, TAKE 1/2 TABLET BY MOUTH DAILY AS NEEDED FOR FLUID OR EDEMA (Patient taking differently: Take 40 mg by mouth daily as needed (fluid retention.).), Disp: 45 tablet, Rfl: 1   glucose blood test strip, CHECK BLOOD SUGAR TWICE DAILY, Disp: 100 each, Rfl: 3   hydrALAZINE (APRESOLINE) 25 MG tablet, Take 1 tablet (25 mg total) by mouth 3 (three) times daily., Disp: 90 tablet, Rfl: 2   losartan (COZAAR) 100 MG tablet, Take 1 tablet (100 mg total) by mouth daily. (Patient taking differently: Take 100 mg by mouth in the morning.), Disp: 90 tablet, Rfl: 0   metFORMIN (GLUCOPHAGE) 500 MG tablet, Take 500-1,000 mg by mouth See admin instructions. Take 2 tablets (1000 mg) by mouth in the morning and take 1 tablet (500 mg) by mouth in the evening, Disp: , Rfl:    oxyCODONE-acetaminophen (PERCOCET) 5-325 MG tablet, Take 1 tablet by mouth every 4 (four) hours as needed for severe pain., Disp: 20 tablet, Rfl: 0   tobramycin (TOBREX) 0.3 % ophthalmic solution, Place 1 drop into both eyes See admin instructions. Instill 1 drop both eyes 3 times daily the day before eye injection every 6-8 weeks, Disp: , Rfl:    Review of Systems   Constitutional:  Negative for activity change, appetite change, chills, diaphoresis, fatigue, fever and unexpected weight change.  HENT:  Negative for congestion, rhinorrhea, sinus pressure, sneezing, sore throat and trouble swallowing.   Eyes:  Negative for photophobia and visual disturbance.  Respiratory:  Negative for cough, chest tightness, shortness of breath, wheezing and stridor.   Cardiovascular:  Negative for chest pain, palpitations and leg swelling.  Gastrointestinal:  Negative for abdominal distention, abdominal pain, anal bleeding, blood in stool, constipation, diarrhea, nausea and vomiting.  Genitourinary:  Negative for difficulty urinating, dysuria, flank pain and hematuria.  Musculoskeletal:  Negative for arthralgias, back pain, gait problem, joint swelling and myalgias.  Skin:  Negative for color change, pallor, rash and wound.  Neurological:  Negative for dizziness, tremors, weakness and light-headedness.  Hematological:  Negative for adenopathy. Does not bruise/bleed easily.  Psychiatric/Behavioral:  Negative for agitation, behavioral problems, confusion, decreased concentration, dysphoric mood and sleep disturbance.       Objective:   Physical Exam Constitutional:      Appearance: He is well-developed.  HENT:     Head: Normocephalic and atraumatic.  Eyes:     Conjunctiva/sclera: Conjunctivae normal.  Cardiovascular:     Rate and Rhythm: Normal rate and regular rhythm.  Pulmonary:     Effort: Pulmonary effort is normal. No respiratory distress.     Breath sounds: No wheezing.  Abdominal:     General: There is no distension.     Palpations: Abdomen is soft.  Musculoskeletal:        General: No tenderness. Normal range of motion.     Cervical back: Normal range of motion and neck supple.  Skin:    General: Skin is warm and dry.     Coloration: Skin is not pale.     Findings: No erythema or rash.  Neurological:     General: No focal deficit present.     Mental  Status: He is alert and oriented to person, place, and time.  Psychiatric:        Mood and Affect: Mood normal.        Behavior:  Behavior normal.        Thought Content: Thought content normal.        Judgment: Judgment normal.   Foot and bandaged again today.       Assessment & Plan:   Diabetic foot infection with osteomyelitis status post debridement in June again and now in July and August with placement of graft  Would like to have him on Augmentin until the wound can heal.  I am apprehensive of his inflammatory markers being persistently elevated we will recheck them today as well as a CBC and BMP. I am comfortable continue Augmentin I will plan on seeing him in August.  Will continue to follow closely with Dr. March Rummage  CHF: He has wife wonder whether heart failure could affect the inflammatory markers but this would not be the incasing and unless it was due to an autoimmune or malignant condition.    DM: glycemic control and control of other comorbidities are crucial   I spent 40mnutes with the patient including face to face counseling of the patient and his wife regarding the nature of these diabetic foot infections and multidisciplinary care for them, reviewing hospital records and clinic records from Dr. PMarch Rummagealong with review of medical records before and during the visit and in coordination of his care.

## 2020-11-26 ENCOUNTER — Other Ambulatory Visit: Payer: Self-pay

## 2020-11-26 ENCOUNTER — Ambulatory Visit (INDEPENDENT_AMBULATORY_CARE_PROVIDER_SITE_OTHER): Payer: BC Managed Care – PPO | Admitting: Podiatry

## 2020-11-26 ENCOUNTER — Encounter: Payer: BC Managed Care – PPO | Admitting: Podiatry

## 2020-11-26 DIAGNOSIS — L97513 Non-pressure chronic ulcer of other part of right foot with necrosis of muscle: Secondary | ICD-10-CM

## 2020-11-26 DIAGNOSIS — E11621 Type 2 diabetes mellitus with foot ulcer: Secondary | ICD-10-CM | POA: Diagnosis not present

## 2020-11-26 DIAGNOSIS — L97413 Non-pressure chronic ulcer of right heel and midfoot with necrosis of muscle: Secondary | ICD-10-CM

## 2020-11-26 LAB — BASIC METABOLIC PANEL WITH GFR
BUN: 25 mg/dL (ref 7–25)
CO2: 29 mmol/L (ref 20–32)
Calcium: 8.9 mg/dL (ref 8.6–10.3)
Chloride: 102 mmol/L (ref 98–110)
Creat: 1.22 mg/dL (ref 0.70–1.30)
Glucose, Bld: 175 mg/dL — ABNORMAL HIGH (ref 65–99)
Potassium: 4.7 mmol/L (ref 3.5–5.3)
Sodium: 138 mmol/L (ref 135–146)
eGFR: 71 mL/min/{1.73_m2} (ref >=60)

## 2020-11-26 LAB — CBC WITH DIFFERENTIAL/PLATELET
Absolute Monocytes: 476 cells/uL (ref 200–950)
Basophils Absolute: 62 cells/uL (ref 0–200)
Basophils Relative: 0.9 %
Eosinophils Absolute: 469 cells/uL (ref 15–500)
Eosinophils Relative: 6.8 %
HCT: 40.3 % (ref 38.5–50.0)
Hemoglobin: 12.9 g/dL — ABNORMAL LOW (ref 13.2–17.1)
Lymphs Abs: 2429 cells/uL (ref 850–3900)
MCH: 28.7 pg (ref 27.0–33.0)
MCHC: 32 g/dL (ref 32.0–36.0)
MCV: 89.8 fL (ref 80.0–100.0)
MPV: 11.6 fL (ref 7.5–12.5)
Monocytes Relative: 6.9 %
Neutro Abs: 3464 cells/uL (ref 1500–7800)
Neutrophils Relative %: 50.2 %
Platelets: 235 10*3/uL (ref 140–400)
RBC: 4.49 10*6/uL (ref 4.20–5.80)
RDW: 13 % (ref 11.0–15.0)
Total Lymphocyte: 35.2 %
WBC: 6.9 10*3/uL (ref 3.8–10.8)

## 2020-11-26 LAB — SEDIMENTATION RATE: Sed Rate: 65 mm/h — ABNORMAL HIGH (ref 0–20)

## 2020-11-26 LAB — C-REACTIVE PROTEIN: CRP: 12.9 mg/L — ABNORMAL HIGH (ref <8.0)

## 2020-11-26 MED ORDER — SILVER SULFADIAZINE 1 % EX CREA: TOPICAL_CREAM | CUTANEOUS | 0 refills | Status: DC

## 2020-11-26 NOTE — Progress Notes (Signed)
  Subjective:  Patient ID: Austin Shaw, male    DOB: 11-04-1965,  MRN: [id number]  Chief Complaint  Patient presents with   Wound Check    POV#1 DOS 8.12.2022 RT FOOT WOUND DEBRIDEMENT W/ APPLICATION OF SKIN GRAFT SUBSTITUTE. Pt states no questions or concerns today.   DOS: 07/21/20 Procedure: Right foot 1st toe amputation  DOS: 07/23/20 Procedure: Right foot delayed wound closure with partial metatarsal resection  DOS: 10/15/20 Procedure: Right foot debridement and irrigation, application of skin graft substitute  DOS: 11/19/20 Procedure: Right foot debridement and irrigation, application of skin graft substitute  55 y.o. male presents with the above complaint. History confirmed with patient.   Objective:  Physical Exam: no tenderness at the surgical site, local edema noted and calf supple, nontender. Incision: right foot wound with coapted skin edges. No warmth, erythema, SOI. Assessment:   1. Diabetic ulcer of right midfoot associated with type 2 diabetes mellitus, with necrosis of muscle (HCC)   2. Ulcer of great toe, right, with necrosis of muscle (HCC)     Plan:  Patient was evaluated and treated and all questions answered.  Ulcer right foot -Wound with skin sutures intact. Hold off graft today. Applied silvadene and DSD. Continue dressing with silvadene every few days. Rx sent. F/u in a little over a week, will consider repeat graft at that time.  Return in about 11 days (around 12/07/2020).

## 2020-12-10 ENCOUNTER — Encounter: Payer: BC Managed Care – PPO | Admitting: Podiatry

## 2020-12-10 ENCOUNTER — Other Ambulatory Visit: Payer: Self-pay

## 2020-12-10 ENCOUNTER — Ambulatory Visit (INDEPENDENT_AMBULATORY_CARE_PROVIDER_SITE_OTHER): Payer: BC Managed Care – PPO | Admitting: Podiatry

## 2020-12-10 DIAGNOSIS — E11621 Type 2 diabetes mellitus with foot ulcer: Secondary | ICD-10-CM

## 2020-12-10 DIAGNOSIS — L97413 Non-pressure chronic ulcer of right heel and midfoot with necrosis of muscle: Secondary | ICD-10-CM

## 2020-12-10 NOTE — Progress Notes (Signed)
  Subjective:  Patient ID: Austin Shaw, male    DOB: 21-Apr-1965,  MRN: [id number]  No chief complaint on file.  DOS: 07/21/20 Procedure: Right foot 1st toe amputation  DOS: 07/23/20 Procedure: Right foot delayed wound closure with partial metatarsal resection  DOS: 10/15/20 Procedure: Right foot debridement and irrigation, application of skin graft substitute  DOS: 11/19/20 Procedure: Right foot debridement and irrigation, application of skin graft substitute  55 y.o. male presents with the above complaint. History confirmed with patient. Thinks the wound is doing ok, denies other issues.  Objective:  Physical Exam: no tenderness at the surgical site, local edema noted and calf supple, nontender. Incision: right foot wound with coapted skin edges. No warmth, erythema, SOI. Residual wound distally measuring 0.3x0.4 Assessment:   1. Diabetic ulcer of right midfoot associated with type 2 diabetes mellitus, with necrosis of muscle (HCC)      Plan:  Patient was evaluated and treated and all questions answered.  Ulcer right foot -Wound continues to improve, albeit slowly. Sutures left intact today, staples removed. Minimally debrided, dressed with silvadene and band-aid. Ok to do so daily. F/u in 2 weeks for recheck, may benefit from repeat debridement and graft application.  Return in about 2 weeks (around 12/24/2020) for Wound Care.

## 2020-12-13 ENCOUNTER — Other Ambulatory Visit: Payer: Self-pay | Admitting: Family Medicine

## 2020-12-20 ENCOUNTER — Other Ambulatory Visit: Payer: Self-pay | Admitting: Family Medicine

## 2020-12-21 NOTE — Telephone Encounter (Signed)
Not from pharmacy stating they can not get Libre 14 but have libre 2. Ok to switch?

## 2020-12-22 NOTE — Telephone Encounter (Signed)
Okay with me if okay with patient.  Please notify him and then send if he is in agreement.  Thanks.

## 2020-12-24 ENCOUNTER — Ambulatory Visit (INDEPENDENT_AMBULATORY_CARE_PROVIDER_SITE_OTHER): Payer: BC Managed Care – PPO | Admitting: Podiatry

## 2020-12-24 ENCOUNTER — Other Ambulatory Visit: Payer: Self-pay

## 2020-12-24 DIAGNOSIS — E11621 Type 2 diabetes mellitus with foot ulcer: Secondary | ICD-10-CM

## 2020-12-24 DIAGNOSIS — L97513 Non-pressure chronic ulcer of other part of right foot with necrosis of muscle: Secondary | ICD-10-CM | POA: Diagnosis not present

## 2020-12-24 DIAGNOSIS — L97413 Non-pressure chronic ulcer of right heel and midfoot with necrosis of muscle: Secondary | ICD-10-CM | POA: Diagnosis not present

## 2020-12-24 NOTE — Progress Notes (Signed)
  Subjective:  Patient ID: Austin Shaw, male    DOB: 1965/11/09,  MRN: [id number]  Chief Complaint  Patient presents with   Routine Post Op    POV#3 Denies fever/chills/nausea/vomiting. No new concerns.    DOS: 07/21/20 Procedure: Right foot 1st toe amputation  DOS: 07/23/20 Procedure: Right foot delayed wound closure with partial metatarsal resection  DOS: 10/15/20 Procedure: Right foot debridement and irrigation, application of skin graft substitute  DOS: 11/19/20 Procedure: Right foot debridement and irrigation, application of skin graft substitute  55 y.o. male presents with the above complaint. History confirmed with patient. Denies issues thinks the wound is doing better.  Objective:  Physical Exam: no tenderness at the surgical site, local edema noted and calf supple, nontender. Incision: right foot wound with coapted skin edges. No warmth, erythema, SOI. Residual wound with epithelializing base less than 0.5cm in diameter Assessment:   1. Diabetic ulcer of right midfoot associated with type 2 diabetes mellitus, with necrosis of muscle (HCC)   2. Ulcer of great toe, right, with necrosis of muscle (HCC)     Plan:  Patient was evaluated and treated and all questions answered.  Ulcer right foot -Continues to improve. The wound is starting to epithelialize. Will hold off graft applications given progress. Continue silvadene and band-aid. Ok to get the wound wet and cleanse with dove soap, pat dry and gently dress  Return in about 2 weeks (around 01/07/2021) for Wound Care.

## 2020-12-25 ENCOUNTER — Other Ambulatory Visit: Payer: Self-pay | Admitting: Family Medicine

## 2020-12-31 ENCOUNTER — Other Ambulatory Visit: Payer: Self-pay | Admitting: Cardiovascular Disease

## 2021-01-04 ENCOUNTER — Ambulatory Visit: Payer: BC Managed Care – PPO | Admitting: Family Medicine

## 2021-01-04 ENCOUNTER — Other Ambulatory Visit: Payer: Self-pay

## 2021-01-04 ENCOUNTER — Encounter: Payer: Self-pay | Admitting: Family Medicine

## 2021-01-04 VITALS — BP 130/82 | HR 87 | Temp 97.7°F | Ht 69.0 in | Wt 340.0 lb

## 2021-01-04 DIAGNOSIS — E118 Type 2 diabetes mellitus with unspecified complications: Secondary | ICD-10-CM

## 2021-01-04 DIAGNOSIS — E1165 Type 2 diabetes mellitus with hyperglycemia: Secondary | ICD-10-CM | POA: Diagnosis not present

## 2021-01-04 DIAGNOSIS — IMO0002 Reserved for concepts with insufficient information to code with codable children: Secondary | ICD-10-CM

## 2021-01-04 LAB — POCT GLYCOSYLATED HEMOGLOBIN (HGB A1C): Hemoglobin A1C: 7.6 % — AB (ref 4.0–5.6)

## 2021-01-04 MED ORDER — METFORMIN HCL 500 MG PO TABS: ORAL_TABLET | ORAL | Status: DC

## 2021-01-04 MED ORDER — FUROSEMIDE 40 MG PO TABS: 40.0000 mg | ORAL_TABLET | Freq: Every day | ORAL | Status: DC | PRN

## 2021-01-04 NOTE — Progress Notes (Signed)
This visit occurred during the SARS-CoV-2 public health emergency.  Safety protocols were in place, including screening questions prior to the visit, additional usage of staff PPE, and extensive cleaning of exam room while observing appropriate contact time as indicated for disinfecting solutions.  Diabetes:  Using medications without difficulties: yes Hypoglycemic episodes: only if prolonged fasting.   Hyperglycemic episodes: no Feet problems: see exam below.   Blood Sugars averaging:~130s eye exam within last year:yes, with f/u pending.   A1c done at OV.  He'll get a flu shot at the pharmacy.   Status post amputation with right foot gradually healing.  Still on abx.  Taking lasix about once a week or every other week.    Meds, vitals, and allergies reviewed.  ROS: Per HPI unless specifically indicated in ROS section   GEN: nad, alert and oriented HEENT: ncat NECK: supple w/o LA CV: rrr. PULM: ctab, no inc wob ABD: soft, +bs EXT: no edema SKIN: Well-perfused.  R foot with 1st ray amputation clearly improved w/o erythema and healing scab at the incision site.  Area redressed with nonstick pad and Coban dressing. 32 minutes were devoted to patient care in this encounter (this includes time spent reviewing the patient's file/history, interviewing and examining the patient, counseling/reviewing plan with patient).

## 2021-01-04 NOTE — Patient Instructions (Addendum)
Check with the ID clinic about follow up.  Keep your eye clinic appointments.  I would get a flu shot each fall.   Plan on recheck labs at a visit in about 3 months.  Remind me about getting all of the labs done on an arm blood draw and not a fingerstick.  Take care.  Glad to see you.

## 2021-01-05 NOTE — Assessment & Plan Note (Signed)
A1c done at OV, discussed.  He'll get a flu shot at the pharmacy.  Status post amputation with right foot gradually healing.  Still on abx.  Taking lasix about once a week or every other week.  Swelling controlled in the meantime. See exam above.,  Fortunately his foot appears to be healing. Continue antibiotics in the meantime. Continue Trulicity and metformin.  We can recheck his lipids at next office visit along with A1c.

## 2021-01-07 ENCOUNTER — Ambulatory Visit: Payer: BC Managed Care – PPO | Admitting: Infectious Disease

## 2021-01-07 ENCOUNTER — Other Ambulatory Visit: Payer: Self-pay

## 2021-01-07 ENCOUNTER — Ambulatory Visit (INDEPENDENT_AMBULATORY_CARE_PROVIDER_SITE_OTHER): Payer: BC Managed Care – PPO | Admitting: Podiatry

## 2021-01-07 ENCOUNTER — Telehealth: Payer: Self-pay

## 2021-01-07 DIAGNOSIS — E11621 Type 2 diabetes mellitus with foot ulcer: Secondary | ICD-10-CM

## 2021-01-07 DIAGNOSIS — L97513 Non-pressure chronic ulcer of other part of right foot with necrosis of muscle: Secondary | ICD-10-CM | POA: Diagnosis not present

## 2021-01-07 DIAGNOSIS — L97413 Non-pressure chronic ulcer of right heel and midfoot with necrosis of muscle: Secondary | ICD-10-CM

## 2021-01-07 NOTE — Telephone Encounter (Signed)
Pts wife called to confirm appt and there was not one scheduled. The pt needs to be seen for wound care but there is nothing available until the end of October, is there any way to get this pt in sooner or is it ok to see another provider? Please advise.

## 2021-01-07 NOTE — Telephone Encounter (Signed)
Appt scheduled for today.

## 2021-01-10 NOTE — Telephone Encounter (Signed)
Patient was seen and evaluated 

## 2021-01-12 ENCOUNTER — Encounter: Payer: Self-pay | Admitting: Family Medicine

## 2021-01-14 ENCOUNTER — Other Ambulatory Visit: Payer: Self-pay

## 2021-01-14 ENCOUNTER — Ambulatory Visit (INDEPENDENT_AMBULATORY_CARE_PROVIDER_SITE_OTHER): Payer: BC Managed Care – PPO

## 2021-01-14 ENCOUNTER — Ambulatory Visit (INDEPENDENT_AMBULATORY_CARE_PROVIDER_SITE_OTHER): Payer: BC Managed Care – PPO | Admitting: Infectious Disease

## 2021-01-14 VITALS — BP 158/83 | HR 91 | Temp 97.3°F | Resp 16 | Ht 70.0 in | Wt 348.0 lb

## 2021-01-14 DIAGNOSIS — M86179 Other acute osteomyelitis, unspecified ankle and foot: Secondary | ICD-10-CM | POA: Diagnosis not present

## 2021-01-14 DIAGNOSIS — Z23 Encounter for immunization: Secondary | ICD-10-CM | POA: Diagnosis not present

## 2021-01-14 DIAGNOSIS — I739 Peripheral vascular disease, unspecified: Secondary | ICD-10-CM | POA: Diagnosis not present

## 2021-01-14 DIAGNOSIS — A491 Streptococcal infection, unspecified site: Secondary | ICD-10-CM

## 2021-01-14 DIAGNOSIS — Z7185 Encounter for immunization safety counseling: Secondary | ICD-10-CM

## 2021-01-14 DIAGNOSIS — A498 Other bacterial infections of unspecified site: Secondary | ICD-10-CM | POA: Diagnosis not present

## 2021-01-14 MED ORDER — AMOXICILLIN-POT CLAVULANATE 875-125 MG PO TABS: 1.0000 | ORAL_TABLET | Freq: Two times a day (BID) | ORAL | 1 refills | Status: DC

## 2021-01-14 NOTE — Progress Notes (Signed)
  Subjective:  Patient ID: Austin Shaw, male    DOB: [birth year],  MRN: [id number]  Chief Complaint  Patient presents with   Diabetic Ulcer    Follow up right foot ulcer and amputation. Pt states he has been doing well. Per caregiver wound is healing well.     DOS: 07/21/20 Procedure: Right foot 1st toe amputation  DOS: 07/23/20 Procedure: Right foot delayed wound closure with partial metatarsal resection  DOS: 10/15/20 Procedure: Right foot debridement and irrigation, application of skin graft substitute  DOS: 11/19/20 Procedure: Right foot debridement and irrigation, application of skin graft substitute  55 y.o. male presents with the above complaint. History confirmed with patient. Thinks the wound is doing well, denies new issues.  Objective:  Physical Exam: no tenderness at the surgical site, local edema noted and calf supple, nontender. Incision: right foot wound with coapted skin edges. No warmth, erythema, SOI. Residual wound with epithelializing base less than 0.3cm more linear Assessment:   1. Diabetic ulcer of right midfoot associated with type 2 diabetes mellitus, with necrosis of muscle (HCC)   2. Ulcer of great toe, right, with necrosis of muscle (HCC)    Plan:  Patient was evaluated and treated and all questions answered.  Ulcer right foot -Almost fully healed. I do not think he needs further graft application. This should heal uneventfully. Advised to apply silvadene and dressing daily.  He is free to wear normal shoe gear.  He is allowed to shower. Will re-eval in 3 weeks.  Return in about 3 weeks (around 01/28/2021) for Wound Care.

## 2021-01-14 NOTE — Progress Notes (Signed)
Subjective:    Chief complaint follow-up for diabetic foot infection    Patient ID: Austin Shaw, male    DOB: 1965-07-05, 55 y.o.   MRN: [id number]  HPI  55 y.o. male with these mellitus peripheral vascular disease status post revascularization with gangrenous toe with osteomyelitis status post I&D of bursal collection and amputation of first toe then repeat trip to the OR for debridement and resection with cultures from both surgeries yielding group B streptococcus and E. coli that was fairly sensitive.  We  placed him on IV cefazolin along with oral metronidazole.   He had been taking both medications religiously including the metronidazole which she tries to take as quickly as possible to avoid the unpleasant taste gives and ones mouth.   I saw him last on May 19 that was encouraged by the appearance of his wound but discouraged that his inflammatory markers had increased.   We switched him from IV ceftriaxone and oral metronidazole to Augmentin.  Has been taking that since then.  Unfortunately his wound required further debridement and he underwent debridement irrigation of his right foot wound on the third June by Dr. March Rummage but with placement of skin graft over the wound apparently there have been concerned that there will be need for bone biopsy but this was not performed.  Then he had apparent failure of the graft and need for further debridement and placement of a new skin graft.  The last saw him he has had several debridements but his wound is healing up quite nicely with just 1 residual open area.  He remains on Augmentin and is following with podiatry closely.     Past Medical History:  Diagnosis Date   Acute osteomyelitis of toe ([place]) 08/26/2020   Allergy    CHF (congestive heart failure) ([place])    Diabetes mellitus    type 2   E coli infection 09/20/2020   Group B streptococcal infection 09/20/2020   Hyperlipidemia    Hypertension    Morbid obesity  (HCC)    BMI 50   Peripheral vascular disease (HCC)    Sleep apnea    pt states he no longer has sleep apnea since tonsillectomy    Past Surgical History:  Procedure Laterality Date   ABDOMINAL AORTOGRAM W/LOWER EXTREMITY N/A 07/14/2020   Procedure: ABDOMINAL AORTOGRAM W/LOWER EXTREMITY;  Surgeon: Wellington Hampshire, MD;  Location: [facility];  Service: Cardiovascular;  Laterality: N/A;   AMPUTATION Right 07/21/2020   Procedure: AMPUTATION RIGHT GREAT TOE;  Surgeon: Evelina Bucy, DPM;  Location: [facility];  Service: Podiatry;  Laterality: Right;   GRAFT APPLICATION Right 2/68/3419   Procedure: GRAFT APPLICATION;  Surgeon: Evelina Bucy, DPM;  Location: [facility];  Service: Podiatry;  Laterality: Right;   IRRIGATION AND DEBRIDEMENT FOOT Right 07/21/2020   Procedure: IRRIGATION AND DEBRIDEMENT RIGHT GREAT TOE;  Surgeon: Evelina Bucy, DPM;  Location: [facility];  Service: Podiatry;  Laterality: Right;   METATARSAL HEAD EXCISION Right 07/23/2020   Procedure: METATARSAL HEAD EXCISION;  Surgeon: Evelina Bucy, DPM;  Location: [facility];  Service: Podiatry;  Laterality: Right;   TONSILLECTOMY     WOUND DEBRIDEMENT Right 07/23/2020   Procedure: DEBRIDEMENT WOUND WITH DELAYED WOUND CLOSURE;  Surgeon: Evelina Bucy, DPM;  Location: [facility];  Service: Podiatry;  Laterality: Right;   WOUND DEBRIDEMENT Right 09/17/2020   Procedure: DEBRIDEMENT OF RIGHT FOOT WOUND, PARTIAL WOUND CLOSURE, APPLICATION OF SKIN GRAFT SUBSTITUTE;  Surgeon: March Rummage,  Christian Mate, DPM;  Location: [facility];  Service: Podiatry;  Laterality: Right;   WOUND DEBRIDEMENT Right 10/15/2020   Procedure: DEBRIDEMENT WOUND with Application of skin graft substitute;  Surgeon: Evelina Bucy, DPM;  Location: [facility];  Service: Podiatry;  Laterality: Right;   WOUND DEBRIDEMENT Right 11/19/2020   Procedure: DEBRIDEMENT WOUND;  Surgeon: Evelina Bucy, DPM;  Location: [facility];  Service: Podiatry;  Laterality: Right;    Family History  Problem Relation  Age of Onset   Diabetes Father    Diabetes Other        fam hx 1st degree relative   Congestive Heart Failure Other    Lupus Daughter    Sjogren's syndrome Daughter    Colon cancer Neg Hx    Prostate cancer Neg Hx    Esophageal cancer Neg Hx    Rectal cancer Neg Hx       Social History   Socioeconomic History   Marital status: Married    Spouse name: Not on file   Number of children: Not on file   Years of education: Not on file   Highest education level: Not on file  Occupational History   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    4 kids   Married, 1998   From [place]   Social Determinants of Radio broadcast assistant Strain: Not on [employer] Insecurity: Not on file  Transportation Needs: Not on file  Physical Activity: Not on file  Stress: Not on file  Social Connections: Not on file    Allergies  Allergen Reactions   Lipitor [Atorvastatin] Other (See Comments)    Lightheaded & dizzy     Current Outpatient Medications:    amoxicillin-clavulanate (AUGMENTIN) 875-125 MG tablet, Take 1 tablet by mouth 2 (two) times daily., Disp: 60 tablet, Rfl: 1   Blood Glucose Monitoring Suppl (ONE TOUCH ULTRA SYSTEM KIT) w/Device KIT, Check blood sugar twice a day and as directed. Dx E11.8, Disp: 1 each, Rfl: 0   carvedilol (COREG) 12.5 MG tablet, Take 1 tablet (12.5 mg total) by mouth 2 (two) times daily with a meal., Disp: 180 tablet, Rfl: 0   clopidogrel (PLAVIX) 75 MG tablet, Take 1 tablet (75 mg total) by mouth daily., Disp: 30 tablet, Rfl: 6   Continuous Blood Gluc Sensor (FREESTYLE LIBRE 14 DAY SENSOR) MISC, Use to check blood sugars  daily. Dx: E11.9, Disp: 1 each, Rfl: 12   Continuous Blood Gluc Sensor (FREESTYLE LIBRE 2 SENSOR) MISC, Please specify directions, refills and quantity, Disp: 2 each, Rfl: 2   Dulaglutide (TRULICITY) 3.01 SW/1.0XN SOPN, Inject 0.75 mg into the skin once a week. Every Friday in the evening, Disp: , Rfl:    furosemide (LASIX) 40 MG tablet, Take 1 tablet (40 mg total) by mouth daily as needed (fluid retention.)., Disp: , Rfl:    glucose blood test strip, CHECK BLOOD SUGAR TWICE DAILY, Disp: 100 each, Rfl: 3   hydrALAZINE (APRESOLINE) 25 MG tablet, TAKE 1 TABLET BY MOUTH THREE TIMES A DAY, Disp: 270 tablet, Rfl: 1  losartan (COZAAR) 100 MG tablet, TAKE 1 TABLET BY MOUTH EVERY DAY, Disp: 90 tablet, Rfl: 0   metFORMIN (GLUCOPHAGE) 500 MG tablet, Take 2 tablets (1000 mg) by mouth in the morning and take 1 tablet (500 mg) by mouth in the evening, Disp: , Rfl:    silver sulfADIAZINE (SILVADENE) 1 % cream, Apply pea-sized amount to wound daily., Disp: 50 g, Rfl: 0   tobramycin (TOBREX) 0.3 % ophthalmic solution, Place 1 drop into both eyes See admin instructions. Instill 1 drop both eyes 3 times daily the day before eye injection every 6-8 weeks, Disp: , Rfl:    oxyCODONE-acetaminophen (PERCOCET) 5-325 MG tablet, Take 1 tablet by mouth every 4 (four) hours as needed for severe pain. (Patient not taking: Reported on 01/14/2021), Disp: 20 tablet, Rfl: 0   Review of Systems  Constitutional:  Negative for activity change, appetite change, chills, diaphoresis, fatigue, fever and unexpected weight change.  HENT:  Negative for congestion, rhinorrhea, sinus pressure, sneezing, sore throat and trouble swallowing.   Eyes:  Negative for photophobia and visual disturbance.  Respiratory:  Negative for cough, chest tightness, shortness of breath, wheezing and stridor.   Cardiovascular:  Negative for chest pain, palpitations and leg swelling.  Gastrointestinal:  Negative for abdominal distention, abdominal pain, anal  bleeding, blood in stool, constipation, diarrhea, nausea and vomiting.  Genitourinary:  Negative for difficulty urinating, dysuria, flank pain and hematuria.  Musculoskeletal:  Negative for arthralgias, back pain, gait problem, joint swelling and myalgias.  Skin:  Positive for wound. Negative for color change, pallor and rash.  Neurological:  Negative for dizziness, tremors, weakness and light-headedness.  Hematological:  Negative for adenopathy. Does not bruise/bleed easily.  Psychiatric/Behavioral:  Negative for agitation, behavioral problems, confusion, decreased concentration, dysphoric mood and sleep disturbance.       Objective:   Physical Exam Constitutional:      Appearance: He is well-developed. He is obese.  HENT:     Head: Normocephalic and atraumatic.  Eyes:     Conjunctiva/sclera: Conjunctivae normal.  Cardiovascular:     Rate and Rhythm: Normal rate and regular rhythm.  Pulmonary:     Effort: Pulmonary effort is normal. No respiratory distress.     Breath sounds: No wheezing.  Abdominal:     General: There is no distension.     Palpations: Abdomen is soft.  Musculoskeletal:        General: No tenderness.     Cervical back: Normal range of motion and neck supple.  Skin:    General: Skin is warm and dry.     Coloration: Skin is not pale.     Findings: No erythema or rash.  Neurological:     General: No focal deficit present.     Mental Status: He is alert and oriented to person, place, and time.  Psychiatric:        Mood and Affect: Mood normal.        Behavior: Behavior normal.        Thought Content: Thought content normal.        Judgment: Judgment normal.   Ulcer today January 14, 2021:         Assessment & Plan:   [place] foot infection with polymicrobial osteomyelitis status post debridement in June and several times since then:  Status post ceftriaxone and metronidazole followed by Augmentin  We will continue Augmentin at present check repeat  sed rate CRP which both were trending down when last checked  Continue to follow  closely with podiatry  Congestive heart failure: Management of this is obviously critical to his perfusion  Diabetes mellitus improving glycemic control   Vaccine counseling I recommended that he get the updated booster for COVID-19 along with influenza vaccine and he decided to get the former.

## 2021-01-15 LAB — COMPLETE METABOLIC PANEL WITH GFR
AG Ratio: 1.2 (calc) (ref 1.0–2.5)
ALT: 18 U/L (ref 9–46)
AST: 21 U/L (ref 10–35)
Albumin: 3.6 g/dL (ref 3.6–5.1)
Alkaline phosphatase (APISO): 61 U/L (ref 35–144)
BUN: 25 mg/dL (ref 7–25)
CO2: 27 mmol/L (ref 20–32)
Calcium: 9.2 mg/dL (ref 8.6–10.3)
Chloride: 102 mmol/L (ref 98–110)
Creat: 1.28 mg/dL (ref 0.70–1.30)
Globulin: 2.9 g/dL (calc) (ref 1.9–3.7)
Glucose, Bld: 199 mg/dL — ABNORMAL HIGH (ref 65–99)
Potassium: 4.3 mmol/L (ref 3.5–5.3)
Sodium: 137 mmol/L (ref 135–146)
Total Bilirubin: 0.5 mg/dL (ref 0.2–1.2)
Total Protein: 6.5 g/dL (ref 6.1–8.1)
eGFR: 67 mL/min/{1.73_m2} (ref >=60)

## 2021-01-15 LAB — CBC WITH DIFFERENTIAL/PLATELET
Absolute Monocytes: 561 cells/uL (ref 200–950)
Basophils Absolute: 71 cells/uL (ref 0–200)
Basophils Relative: 0.9 %
Eosinophils Absolute: 387 cells/uL (ref 15–500)
Eosinophils Relative: 4.9 %
HCT: 39.1 % (ref 38.5–50.0)
Hemoglobin: 12.7 g/dL — ABNORMAL LOW (ref 13.2–17.1)
Lymphs Abs: 2173 cells/uL (ref 850–3900)
MCH: 28.7 pg (ref 27.0–33.0)
MCHC: 32.5 g/dL (ref 32.0–36.0)
MCV: 88.3 fL (ref 80.0–100.0)
MPV: 11.8 fL (ref 7.5–12.5)
Monocytes Relative: 7.1 %
Neutro Abs: 4708 cells/uL (ref 1500–7800)
Neutrophils Relative %: 59.6 %
Platelets: 228 10*3/uL (ref 140–400)
RBC: 4.43 10*6/uL (ref 4.20–5.80)
RDW: 13.1 % (ref 11.0–15.0)
Total Lymphocyte: 27.5 %
WBC: 7.9 10*3/uL (ref 3.8–10.8)

## 2021-01-15 LAB — C-REACTIVE PROTEIN: CRP: 10.1 mg/L — ABNORMAL HIGH (ref <8.0)

## 2021-01-15 LAB — SEDIMENTATION RATE: Sed Rate: 33 mm/h — ABNORMAL HIGH (ref 0–20)

## 2021-01-20 ENCOUNTER — Other Ambulatory Visit: Payer: Self-pay | Admitting: Cardiovascular Disease

## 2021-01-20 NOTE — Telephone Encounter (Signed)
No ans, mailbox full

## 2021-01-20 NOTE — Telephone Encounter (Signed)
Please contact pt for future appointment.  ?Pt overdue for 3 wk f/u. ? ?

## 2021-01-21 NOTE — Telephone Encounter (Signed)
Attempted to schedule.  Patient at work and asks that we reach out to wife.   Lmov for wife Inetta Fermo ok to schedule with her.  PAD patient so Dr. Kirke Corin only .

## 2021-01-21 NOTE — Telephone Encounter (Signed)
Patient is schedule to see Dr. Clifton James. 02/07/21 @ 2 PM

## 2021-01-21 NOTE — Telephone Encounter (Signed)
ATTEMPTED TO SCHEDULE

## 2021-01-25 ENCOUNTER — Other Ambulatory Visit: Payer: Self-pay

## 2021-01-25 ENCOUNTER — Ambulatory Visit (INDEPENDENT_AMBULATORY_CARE_PROVIDER_SITE_OTHER): Payer: BC Managed Care – PPO | Admitting: Podiatry

## 2021-01-25 DIAGNOSIS — E11621 Type 2 diabetes mellitus with foot ulcer: Secondary | ICD-10-CM

## 2021-01-25 DIAGNOSIS — L97513 Non-pressure chronic ulcer of other part of right foot with necrosis of muscle: Secondary | ICD-10-CM | POA: Diagnosis not present

## 2021-01-25 DIAGNOSIS — L97413 Non-pressure chronic ulcer of right heel and midfoot with necrosis of muscle: Secondary | ICD-10-CM

## 2021-01-25 DIAGNOSIS — I739 Peripheral vascular disease, unspecified: Secondary | ICD-10-CM

## 2021-01-31 NOTE — Telephone Encounter (Signed)
Pt wife picked up form also made a copy for scan

## 2021-02-01 NOTE — Progress Notes (Signed)
  Subjective:  Patient ID: MARTYN TIMME, male    DOB: 12-Jun-1965,  MRN: [id number]  Chief Complaint  Patient presents with   wound care     Wound care - 2nd toe numbness , no pain , patient states foot feel stiff    DOS: 07/21/20 Procedure: Right foot 1st toe amputation  DOS: 07/23/20 Procedure: Right foot delayed wound closure with partial metatarsal resection  DOS: 10/15/20 Procedure: Right foot debridement and irrigation, application of skin graft substitute  DOS: 11/19/20 Procedure: Right foot debridement and irrigation, application of skin graft substitute  55 y.o. male presents with the above complaint. History confirmed with patient. Main wound is doing well but the 2nd toe has a blister at the end he is concerned about. Objective:  Physical Exam: no tenderness at the surgical site, local edema noted and calf supple, nontender. Incision: right foot wound with coapted skin edges. No warmth, erythema, SOI. Residual wound with epithelializing base. Superficial distal 2nd toe ulceration 0.2cm without warmth, erythema SOI. Assessment:   1. Diabetic ulcer of right midfoot associated with type 2 diabetes mellitus, with necrosis of muscle ([place])   2. Ulcer of great toe, right, with necrosis of muscle ([place])   3. PVD (peripheral vascular disease) ([place])     Plan:  Patient was evaluated and treated and all questions answered.  Ulcer right foot -Well appearing 1st met wound. Superficial 2nd toe wound. Will revert to surgical shoe. This wound was dressed with abx ointment and band-aid. No signs of infection, no abx warranted at this time.  No follow-ups on file.

## 2021-02-07 ENCOUNTER — Other Ambulatory Visit: Payer: Self-pay

## 2021-02-07 ENCOUNTER — Encounter: Payer: Self-pay | Admitting: Cardiovascular Disease

## 2021-02-07 ENCOUNTER — Ambulatory Visit: Payer: BC Managed Care – PPO | Admitting: Cardiovascular Disease

## 2021-02-07 VITALS — BP 148/80 | HR 77 | Ht 70.0 in | Wt 341.8 lb

## 2021-02-07 DIAGNOSIS — I739 Peripheral vascular disease, unspecified: Secondary | ICD-10-CM | POA: Diagnosis not present

## 2021-02-07 DIAGNOSIS — I428 Other cardiomyopathies: Secondary | ICD-10-CM | POA: Diagnosis not present

## 2021-02-07 DIAGNOSIS — E7800 Pure hypercholesterolemia, unspecified: Secondary | ICD-10-CM | POA: Diagnosis not present

## 2021-02-07 DIAGNOSIS — I5042 Chronic combined systolic (congestive) and diastolic (congestive) heart failure: Secondary | ICD-10-CM

## 2021-02-07 MED ORDER — ROSUVASTATIN CALCIUM 10 MG PO TABS: 10.0000 mg | ORAL_TABLET | Freq: Every day | ORAL | 3 refills | Status: DC

## 2021-02-07 NOTE — Patient Instructions (Addendum)
Medication Instructions:  Your physician has recommended you make the following change in your medication:  1.) rosuvastatin (Crestor) 10 mg - one tablet daily  *If you need a refill on your cardiac medications before your next appointment, please call your pharmacy*   Lab Work: 1.) Today: BMET, Lipids, Liver 2.) Please return in 12 weeks for Lipids and Liver function  If you have labs (blood work) drawn today and your tests are completely normal, you will receive your results only by: MyChart Message (if you have MyChart) OR A paper copy in the mail If you have any lab test that is abnormal or we need to change your treatment, we will call you to review the results.   Testing/Procedures: Your physician has requested that you have an echocardiogram. Echocardiography is a painless test that uses sound waves to create images of your heart. It provides your doctor with information about the size and shape of your heart and how well your heart's chambers and valves are working. This procedure takes approximately one hour. There are no restrictions for this procedure.   Follow-Up: At [facility], you and your health needs are our priority.  As part of our continuing mission to provide you with exceptional heart care, we have created designated Provider Care Teams.  These Care Teams include your primary Cardiologist (physician) and Advanced Practice Providers (APPs -  Physician Assistants and Nurse Practitioners) who all work together to provide you with the care you need, when you need it.   Your next appointment:   4-6 week(s)  The format for your next appointment:   In Person  Provider:   You may see Dr. Verne Carrow or one of the following Advanced Practice Providers on your designated Care Team:   Ronie Spies, PA-C Jacolyn Reedy, PA-C  Tereso Newcomer, PA-C  Gillian Shields, NP  Other Instructions

## 2021-02-07 NOTE — Progress Notes (Signed)
Chief Complaint  Patient presents with   New Patient (Initial Visit)    Hyperlipidemia     History of Present Illness: 55 yo male with history of diabetes mellitus, HTN, hyperlipidemia, morbid obesity, PAD, osteomyelitis and sleep apnea who is here today as a new consult, referred by Dr. Damita Dunnings, to re-establish cardiology care. He had been followed in our office in the past by Dr. Mare Ferrari and then Truitt Merle, NP. Echo in May 2016 with LVEF=20-25% but follow up echo in August 2016 with LVEF=50-55%. He has been statin intolerant. Last LDL 147 in January 2021. He has been on Coreg and Losartan. He has PAD with recent right great toe amputation in April 2022. Dr. Fletcher Anon follows his PAD. 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   He tells me today that he is feeling well overall. He has some LE edema but has not been taking his Lasix every day. No dyspnea or chest pain.   Primary Care Physician: Tonia Ghent, MD  Past Medical History:  Diagnosis Date   Acute osteomyelitis of toe ([place]) 08/26/2020   Allergy    CHF (congestive heart failure) ([place])    Diabetes mellitus    type 2   E coli infection 09/20/2020   Group B streptococcal infection 09/20/2020   Hyperlipidemia    Hypertension    Morbid obesity ([place])    BMI 50   Peripheral vascular disease ([place])    Sleep apnea    pt states he no longer has sleep apnea since tonsillectomy    Past Surgical History:  Procedure Laterality Date   ABDOMINAL AORTOGRAM W/LOWER EXTREMITY N/A 07/14/2020   Procedure: ABDOMINAL AORTOGRAM W/LOWER EXTREMITY;  Surgeon: Wellington Hampshire, MD;  Location: [facility];  Service: Cardiovascular;  Laterality: N/A;   AMPUTATION Right 07/21/2020   Procedure: AMPUTATION RIGHT GREAT TOE;  Surgeon: Evelina Bucy, DPM;  Location: [facility];  Service: Podiatry;  Laterality: Right;   GRAFT APPLICATION Right 6/50/3546   Procedure: GRAFT APPLICATION;  Surgeon: Evelina Bucy, DPM;   Location: [facility];  Service: Podiatry;  Laterality: Right;   IRRIGATION AND DEBRIDEMENT FOOT Right 07/21/2020   Procedure: IRRIGATION AND DEBRIDEMENT RIGHT GREAT TOE;  Surgeon: Evelina Bucy, DPM;  Location: [facility];  Service: Podiatry;  Laterality: Right;   METATARSAL HEAD EXCISION Right 07/23/2020   Procedure: METATARSAL HEAD EXCISION;  Surgeon: Evelina Bucy, DPM;  Location: [facility];  Service: Podiatry;  Laterality: Right;   TONSILLECTOMY     WOUND DEBRIDEMENT Right 07/23/2020   Procedure: DEBRIDEMENT WOUND WITH DELAYED WOUND CLOSURE;  Surgeon: Evelina Bucy, DPM;  Location: [facility];  Service: Podiatry;  Laterality: Right;   WOUND DEBRIDEMENT Right 09/17/2020   Procedure: DEBRIDEMENT OF RIGHT FOOT WOUND, PARTIAL WOUND CLOSURE, APPLICATION OF SKIN GRAFT SUBSTITUTE;  Surgeon: Evelina Bucy, DPM;  Location: [facility];  Service: Podiatry;  Laterality: Right;   WOUND DEBRIDEMENT Right 10/15/2020   Procedure: DEBRIDEMENT WOUND with Application of skin graft substitute;  Surgeon: Evelina Bucy, DPM;  Location: [facility];  Service: Podiatry;  Laterality: Right;   WOUND DEBRIDEMENT Right 11/19/2020   Procedure: DEBRIDEMENT WOUND;  Surgeon: Evelina Bucy, DPM;  Location: [facility];  Service: Podiatry;  Laterality: Right;    Current Outpatient Medications  Medication Sig Dispense Refill   amoxicillin-clavulanate (AUGMENTIN) 875-125 MG tablet Take 1 tablet by mouth 2 (two) times daily. 60 tablet 1   Blood Glucose Monitoring Suppl (  ONE TOUCH ULTRA SYSTEM KIT) w/Device KIT Check blood sugar twice a day and as directed. Dx E11.8 1 each 0   carvedilol (COREG) 12.5 MG tablet Take 1 tablet (12.5 mg total) by mouth 2 (two) times daily with a meal. 180 tablet 0   clopidogrel (PLAVIX) 75 MG tablet Must keep upcoming appt for continuation of refills 90 tablet 0   Continuous Blood Gluc Sensor (FREESTYLE LIBRE 14 DAY SENSOR) MISC Use to check blood sugars daily. Dx: E11.9 1 each 12   Continuous Blood Gluc Sensor (FREESTYLE  LIBRE 2 SENSOR) MISC Please specify directions, refills and quantity 2 each 2   Dulaglutide (TRULICITY) 3.53 IR/4.4RX SOPN Inject 0.75 mg into the skin once a week. Every Friday in the evening     furosemide (LASIX) 40 MG tablet Take 1 tablet (40 mg total) by mouth daily as needed (fluid retention.).     glucose blood test strip CHECK BLOOD SUGAR TWICE DAILY 100 each 3   hydrALAZINE (APRESOLINE) 25 MG tablet TAKE 1 TABLET BY MOUTH THREE TIMES A DAY 270 tablet 1   losartan (COZAAR) 100 MG tablet TAKE 1 TABLET BY MOUTH EVERY DAY 90 tablet 0   metFORMIN (GLUCOPHAGE) 500 MG tablet Take 2 tablets (1000 mg) by mouth in the morning and take 1 tablet (500 mg) by mouth in the evening     oxyCODONE-acetaminophen (PERCOCET) 5-325 MG tablet Take 1 tablet by mouth every 4 (four) hours as needed for severe pain. 20 tablet 0   rosuvastatin (CRESTOR) 10 MG tablet Take 1 tablet (10 mg total) by mouth daily. 90 tablet 3   silver sulfADIAZINE (SILVADENE) 1 % cream Apply pea-sized amount to wound daily. 50 g 0   tobramycin (TOBREX) 0.3 % ophthalmic solution Place 1 drop into both eyes See admin instructions. Instill 1 drop both eyes 3 times daily the day before eye injection every 6-8 weeks     No current facility-administered medications for this visit.    Allergies  Allergen Reactions   Lipitor [Atorvastatin] Other (See Comments)    Lightheaded & dizzy    Social History   Socioeconomic History   Marital status: Married    Spouse name: Not on file   Number of children: Not on file   Years of education: Not on file   Highest education level: Not on file  Occupational History   Occupation: Passenger transport manager    Employer: [employer]  Tobacco Use   Smoking status: Never   Smokeless tobacco: Never  Vaping Use   Vaping Use: Never used  Substance and Sexual Activity   Alcohol use: No    Alcohol/week: 0.0 standard drinks   Drug use: No   Sexual activity: Not on file  Other Topics Concern    Not on file  Social History Narrative   Working at [employer].    4 kids   Married, [place]   Social Determinants of Health   Financial Resource Strain: Not on [employer] Insecurity: Not on file  Transportation Needs: Not on file  Physical Activity: Not on file  Stress: Not on file  Social Connections: Not on file  Intimate Partner Violence: Not on file    Family History  Problem Relation Age of Onset   Diabetes Father    Diabetes Other        fam hx 1st degree relative   Congestive Heart Failure Other    Lupus Daughter    Sjogren's syndrome  Daughter    Colon cancer Neg Hx    Prostate cancer Neg Hx    Esophageal cancer Neg Hx    Rectal cancer Neg Hx     Review of Systems:  As stated in the HPI and otherwise negative.   BP (!) 148/80   Pulse 77   Ht 5' 10"  (1.778 m)   Wt (!) 341 lb 12.8 oz (155 kg)   SpO2 98%   BMI 49.04 kg/m   Physical Examination: General: Well developed, well nourished, NAD  HEENT: OP clear, mucus membranes moist  SKIN: warm, dry. No rashes. Neuro: No focal deficits  Musculoskeletal: Muscle strength 5/5 all ext  Psychiatric: Mood and affect normal  Neck: No JVD, no carotid bruits, no thyromegaly, no lymphadenopathy.  Lungs:Clear bilaterally, no wheezes, rhonci, crackles Cardiovascular: Regular rate and rhythm. No murmurs, gallops or rubs. Abdomen:Soft. Bowel sounds present. Non-tender.  Extremities: 1+ bilateral lower extremity edema.   EKG:  EKG is not ordered today. The ekg ordered today demonstrates   Recent Labs: 01/14/2021: ALT 18; BUN 25; Creat 1.28; Hemoglobin 12.7; Platelets 228; Potassium 4.3; Sodium 137   Lipid Panel    Component Value Date/Time   CHOL 231 (H) 05/06/2019 0751   TRIG 186.0 (H) 05/06/2019 0751   HDL 46.80 05/06/2019 0751   CHOLHDL 5 05/06/2019 0751   VLDL 37.2 05/06/2019 0751   LDLCALC 147 (H) 05/06/2019 0751   LDLDIRECT 155.4 01/19/2011 0908     Wt Readings from Last 3 Encounters:   02/07/21 (!) 341 lb 12.8 oz (155 kg)  01/14/21 (!) 348 lb (157.9 kg)  01/04/21 (!) 340 lb (154.2 kg)       Assessment and Plan:   1. 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 He has been taking Coreg, Losartan and Hydralazine and doing well. Will repeat echo now to assess LV function. Continue Coreg, Losartan and Hydralazine. He will need to be switched to [facility] at his follow up visit. He has some LE edema. Will have him take Lasix 40 mg daily every day. BMET today.   2. Hyperlipidemia: He has not tolerated Lipitor. Will try Crestor 10 mg daily. Lipids and LFTs today and again in 12 weeks. If he does not tolerate Crestor will need consideration for a PCSK9 inh.   3. HMC:NOBSJGGE by Dr. Fletcher Anon. His toe has healed well.   Current medicines are reviewed at length with the patient today.  The patient does not have concerns regarding medicines.  The following changes have been made:  no change  Labs/ tests ordered today include:   Orders Placed This Encounter  Procedures   Basic metabolic panel   Hepatic function panel   Lipid panel   Lipid panel   Hepatic function panel   EKG 12-Lead   ECHOCARDIOGRAM COMPLETE      Disposition:   F/U with me or office APP in 4-6 weeks.    Signed, Lauree Chandler, MD 02/07/2021 2:51 PM    [facility] [address], [place], [place]  [address] Phone: [phone]; Fax: [phone]

## 2021-02-08 LAB — BASIC METABOLIC PANEL
BUN/Creatinine Ratio: 14 (ref 9–20)
BUN: 17 mg/dL (ref 6–24)
CO2: 26 mmol/L (ref 20–29)
Calcium: 9.4 mg/dL (ref 8.7–10.2)
Chloride: 101 mmol/L (ref 96–106)
Creatinine, Ser: 1.2 mg/dL (ref 0.76–1.27)
Glucose: 111 mg/dL — ABNORMAL HIGH (ref 70–99)
Potassium: 4.7 mmol/L (ref 3.5–5.2)
Sodium: 140 mmol/L (ref 134–144)
eGFR: 72 mL/min/{1.73_m2} (ref >59)

## 2021-02-08 LAB — HEPATIC FUNCTION PANEL
ALT: 25 IU/L (ref 0–44)
AST: 29 IU/L (ref 0–40)
Albumin: 4.1 g/dL (ref 3.8–4.9)
Alkaline Phosphatase: 70 IU/L (ref 44–121)
Bilirubin Total: 0.3 mg/dL (ref 0.0–1.2)
Bilirubin, Direct: <0.1 mg/dL (ref 0.00–0.40)
Total Protein: 7.2 g/dL (ref 6.0–8.5)

## 2021-02-08 LAB — LIPID PANEL
Chol/HDL Ratio: 4.5 ratio (ref 0.0–5.0)
Cholesterol, Total: 231 mg/dL — ABNORMAL HIGH (ref 100–199)
HDL: 51 mg/dL (ref >39)
LDL Chol Calc (NIH): 156 mg/dL — ABNORMAL HIGH (ref 0–99)
Triglycerides: 133 mg/dL (ref 0–149)
VLDL Cholesterol Cal: 24 mg/dL (ref 5–40)

## 2021-02-11 ENCOUNTER — Other Ambulatory Visit: Payer: Self-pay

## 2021-02-11 ENCOUNTER — Ambulatory Visit (INDEPENDENT_AMBULATORY_CARE_PROVIDER_SITE_OTHER): Payer: BC Managed Care – PPO | Admitting: Podiatry

## 2021-02-11 DIAGNOSIS — L97413 Non-pressure chronic ulcer of right heel and midfoot with necrosis of muscle: Secondary | ICD-10-CM | POA: Diagnosis not present

## 2021-02-11 DIAGNOSIS — E11621 Type 2 diabetes mellitus with foot ulcer: Secondary | ICD-10-CM | POA: Diagnosis not present

## 2021-02-11 DIAGNOSIS — L97513 Non-pressure chronic ulcer of other part of right foot with necrosis of muscle: Secondary | ICD-10-CM

## 2021-02-11 NOTE — Progress Notes (Signed)
  Subjective:  Patient ID: Austin Shaw, male    DOB: 1966/02/24,  MRN: [id number]  No chief complaint on file.   DOS: 07/21/20 Procedure: Right foot 1st toe amputation  DOS: 07/23/20 Procedure: Right foot delayed wound closure with partial metatarsal resection  DOS: 10/15/20 Procedure: Right foot debridement and irrigation, application of skin graft substitute  DOS: 11/19/20 Procedure: Right foot debridement and irrigation, application of skin graft substitute  55 y.o. male presents with the above complaint. History confirmed with patient. States the toe is doing better denies new issues. Objective:  Physical Exam: no tenderness at the surgical site, local edema noted and calf supple, nontender. Incision: right foot wound with coapted skin edges. No warmth, erythema, SOI. Residual wound with epithelializing base. Superficial distal 2nd toe ulceration 0.2cm without warmth, erythema SOI, wound base mildly fibrotic Assessment:   1. Diabetic ulcer of right midfoot associated with type 2 diabetes mellitus, with necrosis of muscle ([place])   2. Ulcer of great toe, right, with necrosis of muscle ([place])     Plan:  Patient was evaluated and treated and all questions answered.  Ulcer right foot -Well appearing 1st met wound. Superficial 2nd toe wound. Both minimally debrided today, the 2nd toe had good viable bleeding post-debridement. Continue surgical shoe. This wound was dressed with abx ointment and band-aid. No signs of infection, no abx warranted at this time. Casted for toe filler to prevent further ulceration right foot.  No follow-ups on file.

## 2021-02-13 ENCOUNTER — Other Ambulatory Visit: Payer: Self-pay

## 2021-02-14 MED ORDER — CLOPIDOGREL BISULFATE 75 MG PO TABS: ORAL_TABLET | ORAL | 0 refills | Status: DC

## 2021-02-14 NOTE — Telephone Encounter (Signed)
Please contact pt for future appointment. Pt overdue for 3 wk f/u. Needing Refills.

## 2021-02-14 NOTE — Telephone Encounter (Signed)
Patient seen at [address] office

## 2021-02-16 MED ORDER — CLOPIDOGREL BISULFATE 75 MG PO TABS: 75.0000 mg | ORAL_TABLET | Freq: Every day | ORAL | 0 refills | Status: DC

## 2021-02-16 NOTE — Addendum Note (Signed)
Addended by: Margaret Pyle D on: 02/16/2021 11:22 AM   Modules accepted: Orders

## 2021-02-17 ENCOUNTER — Encounter: Payer: Self-pay | Admitting: Podiatry

## 2021-02-25 ENCOUNTER — Encounter: Payer: Self-pay | Admitting: Infectious Disease

## 2021-02-25 ENCOUNTER — Other Ambulatory Visit: Payer: Self-pay

## 2021-02-25 ENCOUNTER — Ambulatory Visit (INDEPENDENT_AMBULATORY_CARE_PROVIDER_SITE_OTHER): Payer: BC Managed Care – PPO | Admitting: Infectious Disease

## 2021-02-25 ENCOUNTER — Ambulatory Visit (INDEPENDENT_AMBULATORY_CARE_PROVIDER_SITE_OTHER): Payer: BC Managed Care – PPO | Admitting: Podiatry

## 2021-02-25 VITALS — BP 209/105 | HR 84 | Temp 98.3°F | Wt 351.0 lb

## 2021-02-25 DIAGNOSIS — L97513 Non-pressure chronic ulcer of other part of right foot with necrosis of muscle: Secondary | ICD-10-CM | POA: Diagnosis not present

## 2021-02-25 DIAGNOSIS — I5022 Chronic systolic (congestive) heart failure: Secondary | ICD-10-CM

## 2021-02-25 DIAGNOSIS — Z89411 Acquired absence of right great toe: Secondary | ICD-10-CM

## 2021-02-25 DIAGNOSIS — A491 Streptococcal infection, unspecified site: Secondary | ICD-10-CM | POA: Diagnosis not present

## 2021-02-25 DIAGNOSIS — M86179 Other acute osteomyelitis, unspecified ankle and foot: Secondary | ICD-10-CM | POA: Diagnosis not present

## 2021-02-25 DIAGNOSIS — L97413 Non-pressure chronic ulcer of right heel and midfoot with necrosis of muscle: Secondary | ICD-10-CM | POA: Diagnosis not present

## 2021-02-25 DIAGNOSIS — E11621 Type 2 diabetes mellitus with foot ulcer: Secondary | ICD-10-CM | POA: Diagnosis not present

## 2021-02-25 DIAGNOSIS — I96 Gangrene, not elsewhere classified: Secondary | ICD-10-CM | POA: Diagnosis not present

## 2021-02-25 DIAGNOSIS — A498 Other bacterial infections of unspecified site: Secondary | ICD-10-CM | POA: Diagnosis not present

## 2021-02-25 DIAGNOSIS — Z23 Encounter for immunization: Secondary | ICD-10-CM | POA: Diagnosis not present

## 2021-02-25 DIAGNOSIS — I10 Essential (primary) hypertension: Secondary | ICD-10-CM | POA: Insufficient documentation

## 2021-02-25 NOTE — Progress Notes (Signed)
Subjective:    Chief complaint follow-up for diabetic foot infection    Patient ID: Austin Shaw, male    DOB: 04/19/1965, 54 y.o.   MRN: [id number]  HPI  55 y.o. male with these mellitus peripheral vascular disease status post revascularization with gangrenous toe with osteomyelitis status post I&D of bursal collection and amputation of first toe then repeat trip to the OR for debridement and resection with cultures from both surgeries yielding group B streptococcus and E. coli that was fairly sensitive.  We  placed him on IV cefazolin along with oral metronidazole.   He had been taking both medications religiously including the metronidazole which she tries to take as quickly as possible to avoid the unpleasant taste gives and ones mouth.   I saw him last on May 19 that was encouraged by the appearance of his wound but discouraged that his inflammatory markers had increased.   We switched him from IV ceftriaxone and oral metronidazole to Augmentin.  Has been taking that since then.  Unfortunately his wound required further debridement and he underwent debridement irrigation of his right foot wound on the third June by Dr. March Rummage but with placement of skin graft over the wound apparently there have been concerned that there will be need for bone biopsy but this was not performed.  Then he had apparent failure of the graft and need for further debridement and placement of a new skin graft.  The last saw him he has had several debridements but his wound is healing up quite nicely with just 1 residual open area. He continues to remain on Augmentin and followed closely by podiatry.       Past Medical History:  Diagnosis Date   Acute osteomyelitis of toe ([facility]) 08/26/2020   Allergy    CHF (congestive heart failure) ([place])    Diabetes mellitus    type 2   E coli infection 09/20/2020   Group B streptococcal infection 09/20/2020   Hyperlipidemia    Hypertension    Morbid  obesity ([facility])    BMI 50   Peripheral vascular disease ([facility])    Sleep apnea    pt states he no longer has sleep apnea since tonsillectomy    Past Surgical History:  Procedure Laterality Date   ABDOMINAL AORTOGRAM W/LOWER EXTREMITY N/A 07/14/2020   Procedure: ABDOMINAL AORTOGRAM W/LOWER EXTREMITY;  Surgeon: Wellington Hampshire, MD;  Location: [facility];  Service: Cardiovascular;  Laterality: N/A;   AMPUTATION Right 07/21/2020   Procedure: AMPUTATION RIGHT GREAT TOE;  Surgeon: Evelina Bucy, DPM;  Location: [facility];  Service: Podiatry;  Laterality: Right;   GRAFT APPLICATION Right 06/22/9456   Procedure: GRAFT APPLICATION;  Surgeon: Evelina Bucy, DPM;  Location: [facility];  Service: Podiatry;  Laterality: Right;   IRRIGATION AND DEBRIDEMENT FOOT Right 07/21/2020   Procedure: IRRIGATION AND DEBRIDEMENT RIGHT GREAT TOE;  Surgeon: Evelina Bucy, DPM;  Location: [facility];  Service: Podiatry;  Laterality: Right;   METATARSAL HEAD EXCISION Right 07/23/2020   Procedure: METATARSAL HEAD EXCISION;  Surgeon: Evelina Bucy, DPM;  Location: [facility];  Service: Podiatry;  Laterality: Right;   TONSILLECTOMY     WOUND DEBRIDEMENT Right 07/23/2020   Procedure: DEBRIDEMENT WOUND WITH DELAYED WOUND CLOSURE;  Surgeon: Evelina Bucy, DPM;  Location: [facility];  Service: Podiatry;  Laterality: Right;   WOUND DEBRIDEMENT Right 09/17/2020   Procedure: DEBRIDEMENT OF RIGHT FOOT WOUND, PARTIAL WOUND CLOSURE, APPLICATION OF SKIN GRAFT SUBSTITUTE;  Surgeon: Evelina Bucy, DPM;  Location: [facility];  Service: Podiatry;  Laterality: Right;   WOUND DEBRIDEMENT Right 10/15/2020   Procedure: DEBRIDEMENT WOUND with Application of skin graft substitute;  Surgeon: Evelina Bucy, DPM;  Location: [facility];  Service: Podiatry;  Laterality: Right;   WOUND DEBRIDEMENT Right 11/19/2020   Procedure: DEBRIDEMENT WOUND;  Surgeon: Evelina Bucy, DPM;  Location: [facility];  Service: Podiatry;  Laterality: Right;    Family History  Problem  Relation Age of Onset   Diabetes Father    Diabetes Other        fam hx 1st degree relative   Congestive Heart Failure Other    Lupus Daughter    Sjogren's syndrome Daughter    Colon cancer Neg Hx    Prostate cancer Neg Hx    Esophageal cancer Neg Hx    Rectal cancer Neg Hx       Social History   Socioeconomic History   Marital status: Married    Spouse name: Not on file   Number of children: Not on file   Years of education: Not on file   Highest education level: Not on file  Occupational History   Occupation: Passenger transport manager    Employer: [employer]  Tobacco Use   Smoking status: Never   Smokeless tobacco: Never  Vaping Use   Vaping Use: Never used  Substance and Sexual Activity   Alcohol use: No    Alcohol/week: 0.0 standard drinks   Drug use: No   Sexual activity: Not on file  Other Topics Concern   Not on file  Social History Narrative   Working at [employer].    4 kids   Married, [place]   Social Determinants of Radio broadcast assistant Strain: Not on [employer] Insecurity: Not on file  Transportation Needs: Not on file  Physical Activity: Not on file  Stress: Not on file  Social Connections: Not on file    Allergies  Allergen Reactions   Lipitor [Atorvastatin] Other (See Comments)    Lightheaded & dizzy     Current Outpatient Medications:    amoxicillin-clavulanate (AUGMENTIN) 875-125 MG tablet, Take 1 tablet by mouth 2 (two) times daily., Disp: 60 tablet, Rfl: 1   Blood Glucose Monitoring Suppl (ONE TOUCH ULTRA SYSTEM KIT) w/Device KIT, Check blood sugar twice a day and as directed. Dx E11.8, Disp: 1 each, Rfl: 0   carvedilol (COREG) 12.5 MG tablet, Take 1 tablet (12.5 mg total) by mouth 2 (two) times daily with a meal., Disp: 180 tablet, Rfl: 0   clopidogrel (PLAVIX) 75 MG tablet, Take 1 tablet (75 mg total) by mouth daily. Must keep upcoming appt for continuation of refills, Disp: 90 tablet, Rfl: 0   Continuous  Blood Gluc Sensor (FREESTYLE LIBRE 14 DAY SENSOR) MISC, Use to check blood sugars daily. Dx: E11.9, Disp: 1 each, Rfl: 12   Continuous Blood Gluc Sensor (FREESTYLE LIBRE 2 SENSOR) MISC, Please specify directions, refills and quantity, Disp: 2 each, Rfl: 2   Dulaglutide (TRULICITY) 4.88 QB/1.6XI SOPN, Inject 0.75 mg into the skin once a week. Every Friday in the evening, Disp: , Rfl:    furosemide (LASIX) 40 MG tablet, Take 1 tablet (40 mg total) by mouth daily as needed (fluid retention.)., Disp: , Rfl:    glucose blood test strip, CHECK BLOOD SUGAR TWICE DAILY, Disp: 100 each, Rfl: 3   hydrALAZINE (APRESOLINE) 25 MG tablet, TAKE 1 TABLET  BY MOUTH THREE TIMES A DAY, Disp: 270 tablet, Rfl: 1   losartan (COZAAR) 100 MG tablet, TAKE 1 TABLET BY MOUTH EVERY DAY, Disp: 90 tablet, Rfl: 0   metFORMIN (GLUCOPHAGE) 500 MG tablet, Take 2 tablets (1000 mg) by mouth in the morning and take 1 tablet (500 mg) by mouth in the evening, Disp: , Rfl:    oxyCODONE-acetaminophen (PERCOCET) 5-325 MG tablet, Take 1 tablet by mouth every 4 (four) hours as needed for severe pain., Disp: 20 tablet, Rfl: 0   rosuvastatin (CRESTOR) 10 MG tablet, Take 1 tablet (10 mg total) by mouth daily., Disp: 90 tablet, Rfl: 3   silver sulfADIAZINE (SILVADENE) 1 % cream, Apply pea-sized amount to wound daily., Disp: 50 g, Rfl: 0   tobramycin (TOBREX) 0.3 % ophthalmic solution, Place 1 drop into both eyes See admin instructions. Instill 1 drop both eyes 3 times daily the day before eye injection every 6-8 weeks, Disp: , Rfl:    Review of Systems  Constitutional:  Negative for activity change, appetite change, chills, diaphoresis, fatigue, fever and unexpected weight change.  HENT:  Negative for congestion, rhinorrhea, sinus pressure, sneezing, sore throat and trouble swallowing.   Eyes:  Negative for photophobia and visual disturbance.  Respiratory:  Negative for cough, chest tightness, shortness of breath, wheezing and stridor.    Cardiovascular:  Negative for chest pain, palpitations and leg swelling.  Gastrointestinal:  Negative for abdominal distention, abdominal pain, anal bleeding, blood in stool, constipation, diarrhea, nausea and vomiting.  Genitourinary:  Negative for difficulty urinating, dysuria, flank pain and hematuria.  Musculoskeletal:  Negative for arthralgias, back pain, gait problem, joint swelling and myalgias.  Skin:  Negative for color change, pallor, rash and wound.  Neurological:  Negative for dizziness, tremors, weakness and light-headedness.  Hematological:  Negative for adenopathy. Does not bruise/bleed easily.  Psychiatric/Behavioral:  Negative for agitation, behavioral problems, confusion, decreased concentration, dysphoric mood and sleep disturbance.       Objective:   Physical Exam Constitutional:      Appearance: He is well-developed.  HENT:     Head: Normocephalic and atraumatic.  Eyes:     Conjunctiva/sclera: Conjunctivae normal.  Cardiovascular:     Rate and Rhythm: Normal rate and regular rhythm.  Pulmonary:     Effort: Pulmonary effort is normal. No respiratory distress.     Breath sounds: No wheezing.  Abdominal:     General: There is no distension.     Palpations: Abdomen is soft.  Musculoskeletal:        General: No tenderness. Normal range of motion.     Cervical back: Normal range of motion and neck supple.  Skin:    General: Skin is warm and dry.     Coloration: Skin is not pale.     Findings: No erythema or rash.  Neurological:     General: No focal deficit present.     Mental Status: He is alert and oriented to person, place, and time.  Psychiatric:        Mood and Affect: Mood normal.        Behavior: Behavior normal.        Thought Content: Thought content normal.        Judgment: Judgment normal.   Ulcer January 14, 2021:     02/25/2021:       Assessment & Plan:    Diabetic foot ulcer with polymicrobial osteomyelitis status post debridement  in June and several times since then.  He has  recently received ceftriaxone and metronidazole followed by Augmentin  We will check sed rate CRP BMP and CBC with differential today.  If they are encouraging I will stop his Augmentin and see him back in follow-up in 2 months time.  CHF: Management this is critical as well diabetes mellitus continue to follow with primary care and optimize glycemic control  Congestive heart failure: Management of this is obviously critical to his perfusion  Diabetes mellitus improving glycemic control  Hypertension: very poorly controlled today in 753'Y systolic, 051T but asymptomatic  Vaccine counseling: he was recommended to receive influenza vaccine and received this today.   Vaccine counseling I recommended that he get the updated booster for COVID-19 along with influenza vaccine and he decided to get the former.

## 2021-02-26 LAB — CBC WITH DIFFERENTIAL/PLATELET
Absolute Monocytes: 475 cells/uL (ref 200–950)
Basophils Absolute: 59 cells/uL (ref 0–200)
Basophils Relative: 0.9 %
Eosinophils Absolute: 325 cells/uL (ref 15–500)
Eosinophils Relative: 5 %
HCT: 39.8 % (ref 38.5–50.0)
Hemoglobin: 12.7 g/dL — ABNORMAL LOW (ref 13.2–17.1)
Lymphs Abs: 2295 cells/uL (ref 850–3900)
MCH: 28.7 pg (ref 27.0–33.0)
MCHC: 31.9 g/dL — ABNORMAL LOW (ref 32.0–36.0)
MCV: 89.8 fL (ref 80.0–100.0)
MPV: 12.2 fL (ref 7.5–12.5)
Monocytes Relative: 7.3 %
Neutro Abs: 3348 cells/uL (ref 1500–7800)
Neutrophils Relative %: 51.5 %
Platelets: 223 10*3/uL (ref 140–400)
RBC: 4.43 10*6/uL (ref 4.20–5.80)
RDW: 13.6 % (ref 11.0–15.0)
Total Lymphocyte: 35.3 %
WBC: 6.5 10*3/uL (ref 3.8–10.8)

## 2021-02-26 LAB — BASIC METABOLIC PANEL WITH GFR
BUN: 16 mg/dL (ref 7–25)
CO2: 30 mmol/L (ref 20–32)
Calcium: 8.9 mg/dL (ref 8.6–10.3)
Chloride: 103 mmol/L (ref 98–110)
Creat: 1.14 mg/dL (ref 0.70–1.30)
Glucose, Bld: 121 mg/dL — ABNORMAL HIGH (ref 65–99)
Potassium: 4.5 mmol/L (ref 3.5–5.3)
Sodium: 141 mmol/L (ref 135–146)
eGFR: 77 mL/min/{1.73_m2} (ref >=60)

## 2021-02-26 LAB — C-REACTIVE PROTEIN: CRP: 11.9 mg/L — ABNORMAL HIGH (ref <8.0)

## 2021-02-26 LAB — SEDIMENTATION RATE: Sed Rate: 84 mm/h — ABNORMAL HIGH (ref 0–20)

## 2021-02-28 NOTE — Progress Notes (Signed)
  Subjective:  Patient ID: Austin Shaw, male    DOB: September 16, 1965,  MRN: [id number]  Chief Complaint  Patient presents with   Diabetic Ulcer    Follow up right foot diabetic ulcer check. Pt states he is doing well. No questions or concerns.    DOS: 07/21/20 Procedure: Right foot 1st toe amputation  DOS: 07/23/20 Procedure: Right foot delayed wound closure with partial metatarsal resection  DOS: 10/15/20 Procedure: Right foot debridement and irrigation, application of skin graft substitute  DOS: 11/19/20 Procedure: Right foot debridement and irrigation, application of skin graft substitute  55 y.o. male presents with the above complaint. History confirmed with patient.   Objective:  Physical Exam: no tenderness at the surgical site, local edema noted and calf supple, nontender. Incision: right foot wound with coapted skin edges. No warmth, erythema, SOI. Residual wound with epithelializing base. Superficial distal 2nd toe ulceration 0.2cm without warmth, erythema SOI, wound base mildly fibrotic Assessment:   1. Diabetic ulcer of right midfoot associated with type 2 diabetes mellitus, with necrosis of muscle ([facility])   2. Ulcer of great toe, right, with necrosis of muscle ([facility])    Plan:  Patient was evaluated and treated and all questions answered.  Ulcer right foot -Ulcer well-appearing. Awaiting toe filler with insert -Minimally debrided wound, non-procedural. Dressed with silvadene and band-aid. Continue daily -Continue surgical shoe until we get toe filler.  Return in about 3 weeks (around 03/18/2021) for Wound Care.

## 2021-03-01 ENCOUNTER — Other Ambulatory Visit: Payer: Self-pay

## 2021-03-01 ENCOUNTER — Ambulatory Visit (HOSPITAL_COMMUNITY): Payer: BC Managed Care – PPO | Attending: Cardiovascular Disease

## 2021-03-01 DIAGNOSIS — I739 Peripheral vascular disease, unspecified: Secondary | ICD-10-CM | POA: Diagnosis not present

## 2021-03-01 DIAGNOSIS — I5042 Chronic combined systolic (congestive) and diastolic (congestive) heart failure: Secondary | ICD-10-CM | POA: Diagnosis not present

## 2021-03-01 DIAGNOSIS — I428 Other cardiomyopathies: Secondary | ICD-10-CM

## 2021-03-01 DIAGNOSIS — E7800 Pure hypercholesterolemia, unspecified: Secondary | ICD-10-CM

## 2021-03-01 MED ORDER — PERFLUTREN LIPID MICROSPHERE
1.0000 mL | INTRAVENOUS | Status: AC | PRN
Start: 2021-03-01 — End: 2021-03-01
  Administered 2021-03-01: 1 mL via INTRAVENOUS

## 2021-03-02 LAB — ECHOCARDIOGRAM COMPLETE
Area-P 1/2: 4.83 cm2
S' Lateral: 4 cm

## 2021-03-08 ENCOUNTER — Telehealth: Payer: Self-pay | Admitting: Podiatry

## 2021-03-08 NOTE — Telephone Encounter (Signed)
Per Natasha Mead D @ [facility] state no auth is needed for the L5000. Ref # Natasha Mead D [id number].[name]

## 2021-03-09 ENCOUNTER — Encounter: Payer: Self-pay | Admitting: Infectious Disease

## 2021-03-10 ENCOUNTER — Other Ambulatory Visit: Payer: Self-pay | Admitting: Infectious Disease

## 2021-03-10 DIAGNOSIS — M86179 Other acute osteomyelitis, unspecified ankle and foot: Secondary | ICD-10-CM

## 2021-03-10 MED ORDER — AMOXICILLIN-POT CLAVULANATE 875-125 MG PO TABS: 1.0000 | ORAL_TABLET | Freq: Two times a day (BID) | ORAL | 3 refills | Status: DC

## 2021-03-11 ENCOUNTER — Other Ambulatory Visit: Payer: Self-pay | Admitting: Family Medicine

## 2021-03-15 NOTE — Progress Notes (Signed)
Cardiology Office Note    Date:  03/23/2021   ID:  Austin Shaw, DOB 1965/12/19, MRN [id number]   PCP:  Joaquim Nam, MD   [facility]  Cardiologist:  Verne Carrow, MD   Advanced Practice Provider:  No care team member to display Electrophysiologist:  None   [phone]   Chief Complaint  Patient presents with   Follow-up     History of Present Illness:  Austin Shaw is a 55 y.o. male with history of diabetes mellitus, HTN, hyperlipidemia, morbid obesity, PAD, osteomyelitis and sleep apnea, NICM recovered    Echo in May 2016 with LVEF=20-25% but follow up echo in August 2016 with LVEF=50-55%. He has been statin intolerant. Last LDL 147 in January 2021. He has been on Coreg and Losartan. 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 Dr. Kirke Corin follows his PAD. 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   Patient saw Dr. Clifton James 02/07/21 to get reestablished with cardiology.had him take lasix daily and plan to switch to entresto at f/u. Echo 03/01/21 LVEF 45-50%.  Patient comes in for f/u. Taking lasix 40 mg alternating with 20 mg every other day. Weight up 9 lbs. Says edema is better. No chest pain, dyspnea, dizziness or presyncope. BP up. He missed his meds last night and hasn't and hasn't taken them today.     Past Medical History:  Diagnosis Date   Acute osteomyelitis of toe (HCC) 08/26/2020   Allergy    CHF (congestive heart failure) (HCC)    Diabetes mellitus    type 2   E coli infection 09/20/2020   Group B streptococcal infection 09/20/2020   Hyperlipidemia    Hypertension    Hypertension    Morbid obesity (HCC)    BMI 50   Peripheral vascular disease (HCC)    Sleep apnea    pt states he no longer has sleep apnea since tonsillectomy    Past Surgical History:  Procedure Laterality Date   ABDOMINAL AORTOGRAM W/LOWER EXTREMITY N/A 07/14/2020   Procedure: ABDOMINAL AORTOGRAM  W/LOWER EXTREMITY;  Surgeon: Iran Ouch, MD;  [facility]: [facility];  Service: Cardiovascular;  Laterality: N/A;   AMPUTATION Right 07/21/2020   Procedure: AMPUTATION RIGHT GREAT TOE;  Surgeon: Park Liter, DPM;  Location: [facility] OR;  Service: Podiatry;  Laterality: Right;   GRAFT APPLICATION Right 11/19/2020   Procedure: GRAFT APPLICATION;  Surgeon: Park Liter, DPM;  Location: [facility];  Service: Podiatry;  Laterality: Right;   IRRIGATION AND DEBRIDEMENT FOOT Right 07/21/2020   Procedure: IRRIGATION AND DEBRIDEMENT RIGHT GREAT TOE;  Surgeon: Park Liter, DPM;  Location: [facility];  Service: Podiatry;  Laterality: Right;   METATARSAL HEAD EXCISION Right 07/23/2020   Procedure: METATARSAL HEAD EXCISION;  Surgeon: Park Liter, DPM;  Location: [facility];  Service: Podiatry;  Laterality: Right;   TONSILLECTOMY     WOUND DEBRIDEMENT Right 07/23/2020   Procedure: DEBRIDEMENT WOUND WITH DELAYED WOUND CLOSURE;  Surgeon: Park Liter, DPM;  Location: [facility];  Service: Podiatry;  Laterality: Right;   WOUND DEBRIDEMENT Right 09/17/2020   Procedure: DEBRIDEMENT OF RIGHT FOOT WOUND, PARTIAL WOUND CLOSURE, APPLICATION OF SKIN GRAFT SUBSTITUTE;  Surgeon: Park Liter, DPM;  Location: [facility];  Service: Podiatry;  Laterality: Right;   WOUND DEBRIDEMENT Right 10/15/2020   Procedure: DEBRIDEMENT WOUND with Application of skin graft substitute;  Surgeon: Park Liter, DPM;  Location: [facility];  Service: Podiatry;  Laterality: Right;   WOUND DEBRIDEMENT Right 11/19/2020   Procedure: DEBRIDEMENT WOUND;  Surgeon: Park Liter, DPM;  Location: [facility];  Service: Podiatry;  Laterality: Right;    Current Medications: Current Meds  Medication Sig   amoxicillin-clavulanate (AUGMENTIN) 875-125 MG tablet Take 1 tablet by mouth 2 (two) times daily.   carvedilol (COREG) 12.5 MG tablet TAKE 1 TABLET (12.5 MG TOTAL) BY MOUTH 2 (TWO) TIMES DAILY WITH A MEAL.   clopidogrel (PLAVIX) 75 MG tablet Take 1  tablet (75 mg total) by mouth daily. Must keep upcoming appt for continuation of refills   Continuous Blood Gluc Sensor (FREESTYLE LIBRE 14 DAY SENSOR) MISC Use to check blood sugars daily. Dx: E11.9   Continuous Blood Gluc Sensor (FREESTYLE LIBRE 2 SENSOR) MISC Please specify directions, refills and quantity   Dulaglutide (TRULICITY) 0.75 MG/0.5ML SOPN Inject 0.75 mg into the skin once a week. Every Friday in the evening   furosemide (LASIX) 40 MG tablet Take 40 mg by mouth daily.   glucose blood test strip CHECK BLOOD SUGAR TWICE DAILY   hydrALAZINE (APRESOLINE) 25 MG tablet TAKE 1 TABLET BY MOUTH THREE TIMES A DAY   metFORMIN (GLUCOPHAGE) 500 MG tablet Take 2 tablets (1000 mg) by mouth in the morning and take 1 tablet (500 mg) by mouth in the evening   oxyCODONE-acetaminophen (PERCOCET) 5-325 MG tablet Take 1 tablet by mouth every 4 (four) hours as needed for severe pain.   rosuvastatin (CRESTOR) 10 MG tablet Take 1 tablet (10 mg total) by mouth daily.   sacubitril-valsartan (ENTRESTO) 24-26 MG Take 1 tablet by mouth 2 (two) times daily.   silver sulfADIAZINE (SILVADENE) 1 % cream Apply pea-sized amount to wound daily.   tobramycin (TOBREX) 0.3 % ophthalmic solution Place 1 drop into both eyes See admin instructions. Instill 1 drop both eyes 3 times daily the day before eye injection every 6-8 weeks   [DISCONTINUED] losartan (COZAAR) 100 MG tablet TAKE 1 TABLET BY MOUTH EVERY DAY     Allergies:   Lipitor [atorvastatin]   Social History   Socioeconomic History   Marital status: Married    Spouse name: Not on file   Number of children: Not on file   Years of education: Not on file   Highest education level: Not on file  Occupational History   Occupation: Account Event organiser: [employer]  Tobacco Use   Smoking status: Never   Smokeless tobacco: Never  Vaping Use   Vaping Use: Never used  Substance and Sexual Activity   Alcohol use: No    Alcohol/week: 0.0  standard drinks   Drug use: No   Sexual activity: Not on file  Other Topics Concern   Not on file  Social History Narrative   Working at [employer]    4 kids   Married, 1998   From [place]   Social Determinants of Corporate investment banker Strain: Not on [employer] Insecurity: Not on file  Transportation Needs: Not on file  Physical Activity: Not on file  Stress: Not on file  Social Connections: Not on file     Family History:  The patient's  family history includes Congestive Heart Failure in an other family member; Diabetes in his father and another family member; Lupus in his daughter; Sjogren's syndrome in his daughter.   ROS:   Please see the history of present illness.    ROS All  other systems reviewed and are negative.   PHYSICAL EXAM:   VS:  BP (!) 178/102   Pulse 85   Ht 5' 9.5" (1.765 m)   Wt (!) 350 lb (158.8 kg)   SpO2 98%   BMI 50.94 kg/m   Physical Exam  GEN: Obese, in no acute distress  Neck: increased JVD, carotid bruits, or masses Cardiac:RRR; no murmurs, rubs, or gallops  Respiratory:  bilateral wheezing GI: soft, nontender, nondistended, + BS Ext: plus 1 edema otherwise without cyanosis, clubbing,Good distal pulses bilaterally Neuro:  Alert and Oriented x 3 Psych: euthymic mood, full affect  Wt Readings from Last 3 Encounters:  03/23/21 (!) 350 lb (158.8 kg)  02/25/21 (!) 351 lb (159.2 kg)  02/07/21 (!) 341 lb 12.8 oz (155 kg)      Studies/Labs Reviewed:   EKG:  EKG is not ordered today.     Recent Labs: 02/07/2021: ALT 25 02/25/2021: BUN 16; Creat 1.14; Hemoglobin 12.7; Platelets 223; Potassium 4.5; Sodium 141   Lipid Panel    Component Value Date/Time   CHOL 231 (H) 02/07/2021 1447   TRIG 133 02/07/2021 1447   HDL 51 02/07/2021 1447   CHOLHDL 4.5 02/07/2021 1447   CHOLHDL 5 05/06/2019 0751   VLDL 37.2 05/06/2019 0751   LDLCALC 156 (H) 02/07/2021 1447   LDLDIRECT 155.4 01/19/2011 0908    Additional studies/ records that  were reviewed today include:  Echo 03/01/21 IMPRESSIONS     1. Left ventricular ejection fraction, by estimation, is 45 to 50%. The  left ventricle has mildly decreased function. The left ventricle  demonstrates global hypokinesis. There is moderate left ventricular  hypertrophy. Left ventricular diastolic  parameters are consistent with Grade I diastolic dysfunction (impaired  relaxation).   2. Right ventricular systolic function is normal. The right ventricular  size is normal. Tricuspid regurgitation signal is inadequate for assessing  PA pressure.   3. The mitral valve is normal in structure. Trivial mitral valve  regurgitation. No evidence of mitral stenosis.   4. The aortic valve is tricuspid. There is mild calcification of the  aortic valve. Aortic valve regurgitation is not visualized. No aortic  stenosis is present.   5. The inferior vena cava is normal in size with greater than 50%  respiratory variability, suggesting right atrial pressure of 3 mmHg.   FINDINGS    Risk Assessment/Calculations:         ASSESSMENT:    1. NICM (nonischemic cardiomyopathy) (HCC)   2. Acute on chronic systolic CHF (congestive heart failure) (HCC)   3. Essential hypertension   4. Hyperlipidemia, unspecified hyperlipidemia type   5. OBESITY, MORBID   6. OSA (obstructive sleep apnea)   7. PVD (peripheral vascular disease) (HCC)      PLAN:  In order of problems listed above:  NICM  EF 45-50% on echo 02/2021 on coreg hydralazine. Will switch losartan to entresto. Patient hasn't taken his meds today or last night. BP very high. Stressed compliance. Hold losartan this am and can start entresto 24/26mg  tonight  Acute on chronic systolic CHF-increase lasix 40 mg daily. Check bmet today and in 2 weeks, 2 gm sodium diet. Stressed compliance  HTN very high but hasn't taken meds for at least 24 hrs  HLD LDL 156 in Oct-now on Crestor  Mobid obesity-exercise and weight loss essential to  overall health  OSA-says he never had CPAP and no problem since his tonsils removed 20 yrs ago.  PAD followed by Dr. Royston Cowper arterial  disease below right knee  Shared Decision Making/Informed Consent        Medication Adjustments/Labs and Tests Ordered: Current medicines are reviewed at length with the patient today.  Concerns regarding medicines are outlined above.  Medication changes, Labs and Tests ordered today are listed in the Patient Instructions below. Patient Instructions  Medication Instructions:  1.Stop Losartan 2.Start Entresto 24/26 mg, take one tablet by mouth twice a day. Take first dose tonight 3.Take furosemide (Lasix) 40 mg daily *If you need a refill on your cardiac medications before your next appointment, please call your pharmacy*   Lab Work: BMET today and in 2 weeks If you have labs (blood work) drawn today and your tests are completely normal, you will receive your results only by: MyChart Message (if you have MyChart) OR A paper copy in the mail If you have any lab test that is abnormal or we need to change your treatment, we will call you to review the results.   Follow-Up: At [facility], you and your health needs are our priority.  As part of our continuing mission to provide you with exceptional heart care, we have created designated Provider Care Teams.  These Care Teams include your primary Cardiologist (physician) and Advanced Practice Providers (APPs -  Physician Assistants and Nurse Practitioners) who all work together to provide you with the care you need, when you need it.     Your next appointment:   2-3week(s) with one of our pharmacists here in the office     Other Instructions Two Gram Sodium Diet 2000 mg  What is Sodium? Sodium is a mineral found naturally in many foods. The most significant source of sodium in the diet is table salt, which is about 40% sodium.  Processed, convenience, and preserved foods also contain a large  amount of sodium.  The body needs only 500 mg of sodium daily to function,  A normal diet provides more than enough sodium even if you do not use salt.  Why Limit Sodium? A build up of sodium in the body can cause thirst, increased blood pressure, shortness of breath, and water retention.  Decreasing sodium in the diet can reduce edema and risk of heart attack or stroke associated with high blood pressure.  Keep in mind that there are many other factors involved in these health problems.  Heredity, obesity, lack of exercise, cigarette smoking, stress and what you eat all play a role.  General Guidelines: Do not add salt at the table or in cooking.  One teaspoon of salt contains over 2 grams of sodium. Read food labels Avoid processed and convenience foods Ask your dietitian before eating any foods not dicussed in the menu planning guidelines Consult your physician if you wish to use a salt substitute or a sodium containing medication such as antacids.  Limit milk and milk products to 16 oz (2 cups) per day.  Shopping Hints: READ LABELS!! "Dietetic" does not necessarily mean low sodium. Salt and other sodium ingredients are often added to foods during processing.    Menu Planning Guidelines Food Group Choose More Often Avoid  Beverages (see also the milk group All fruit juices, low-sodium, salt-free vegetables juices, low-sodium carbonated beverages Regular vegetable or tomato juices, commercially softened water used for drinking or cooking  Breads and Cereals Enriched white, wheat, rye and pumpernickel bread, hard rolls and dinner rolls; muffins, cornbread and waffles; most dry cereals, cooked cereal without added salt; unsalted crackers and breadsticks; low sodium or homemade  bread crumbs Bread, rolls and crackers with salted tops; quick breads; instant hot cereals; pancakes; commercial bread stuffing; self-rising flower and biscuit mixes; regular bread crumbs or cracker crumbs  Desserts and  Sweets Desserts and sweets mad with mild should be within allowance Instant pudding mixes and cake mixes  Fats Butter or margarine; vegetable oils; unsalted salad dressings, regular salad dressings limited to 1 Tbs; light, sour and heavy cream Regular salad dressings containing bacon fat, bacon bits, and salt pork; snack dips made with instant soup mixes or processed cheese; salted nuts  Fruits Most fresh, frozen and canned fruits Fruits processed with salt or sodium-containing ingredient (some dried fruits are processed with sodium sulfites        Vegetables Fresh, frozen vegetables and low- sodium canned vegetables Regular canned vegetables, sauerkraut, pickled vegetables, and others prepared in brine; frozen vegetables in sauces; vegetables seasoned with ham, bacon or salt pork  Condiments, Sauces, Miscellaneous  Salt substitute with physician's approval; pepper, herbs, spices; vinegar, lemon or lime juice; hot pepper sauce; garlic powder, onion powder, low sodium soy sauce (1 Tbs.); low sodium condiments (ketchup, chili sauce, mustard) in limited amounts (1 tsp.) fresh ground horseradish; unsalted tortilla chips, pretzels, potato chips, popcorn, salsa (1/4 cup) Any seasoning made with salt including garlic salt, celery salt, onion salt, and seasoned salt; sea salt, rock salt, kosher salt; meat tenderizers; monosodium glutamate; mustard, regular soy sauce, barbecue, sauce, chili sauce, teriyaki sauce, steak sauce, Worcestershire sauce, and most flavored vinegars; canned gravy and mixes; regular condiments; salted snack foods, olives, picles, relish, horseradish sauce, catsup   Food preparation: Try these seasonings Meats:    Pork Sage, onion Serve with applesauce  Chicken Poultry seasoning, thyme, parsley Serve with cranberry sauce  Lamb Curry powder, rosemary, garlic, thyme Serve with mint sauce or jelly  Veal Marjoram, basil Serve with current jelly, cranberry sauce  Beef Pepper, bay leaf  Serve with dry mustard, unsalted chive butter  Fish [place] leaf, dill Serve with unsalted lemon butter, unsalted parsley butter  Vegetables:    Asparagus Lemon juice   Broccoli Lemon juice   Carrots Mustard dressing parsley, mint, nutmeg, glazed with unsalted butter and sugar   Green beans Marjoram, lemon juice, nutmeg,dill seed   Tomatoes Basil, marjoram, onion   Spice /blend for [employer]" 4 tsp ground thyme 1 tsp ground sage 3 tsp ground rosemary 4 tsp ground marjoram   Test your knowledge A product that says "Salt Free" may still contain sodium. True or False Garlic Powder and Hot Pepper Sauce an be used as alternative seasonings.True or False Processed foods have more sodium than fresh foods.  True or False Canned Vegetables have less sodium than froze True or False   WAYS TO DECREASE YOUR SODIUM INTAKE Avoid the use of added salt in cooking and at the table.  Table salt (and other prepared seasonings which contain salt) is probably one of the greatest sources of sodium in the diet.  Unsalted foods can gain flavor from the sweet, sour, and butter taste sensations of herbs and spices.  Instead of using salt for seasoning, try the following seasonings with the foods listed.  Remember: how you use them to enhance natural food flavors is limited only by your creativity... Allspice-Meat, fish, eggs, fruit, peas, red and yellow vegetables Almond Extract-Fruit baked goods Anise Seed-Sweet breads, fruit, carrots, beets, cottage cheese, cookies (tastes like licorice) Basil-Meat, fish, eggs, vegetables, rice, vegetables salads, soups, sauces Bay Leaf-Meat, fish, stews, poultry Burnet-Salad, vegetables (cucumber-like  flavor) Caraway Seed-Bread, cookies, cottage cheese, meat, vegetables, cheese, rice Cardamon-Baked goods, fruit, soups Celery Powder or seed-Salads, salad dressings, sauces, meatloaf, soup, bread.Do not use  celery salt Chervil-Meats, salads, fish, eggs, vegetables, cottage  cheese (parsley-like flavor) Chili Power-Meatloaf, chicken cheese, corn, eggplant, egg dishes Chives-Salads cottage cheese, egg dishes, soups, vegetables, sauces Cilantro-Salsa, casseroles Cinnamon-Baked goods, fruit, pork, lamb, chicken, carrots Cloves-Fruit, baked goods, fish, pot roast, green beans, beets, carrots Coriander-Pastry, cookies, meat, salads, cheese (lemon-orange flavor) Cumin-Meatloaf, fish,cheese, eggs, cabbage,fruit pie (caraway flavor) [employer], fruit, eggs, fish, poultry, cottage cheese, vegetables Dill Seed-Meat, cottage cheese, poultry, vegetables, fish, salads, bread Fennel Seed-Bread, cookies, apples, pork, eggs, fish, beets, cabbage, cheese, Licorice-like flavor Garlic-(buds or powder) Salads, meat, poultry, fish, bread, butter, vegetables, potatoes.Do not  use garlic salt Ginger-Fruit, vegetables, baked goods, meat, fish, poultry Horseradish Root-Meet, vegetables, butter Lemon Juice or Extract-Vegetables, fruit, tea, baked goods, fish salads Mace-Baked goods fruit, vegetables, fish, poultry (taste like nutmeg) Maple Extract-Syrups Marjoram-Meat, chicken, fish, vegetables, breads, green salads (taste like Sage) Mint-Tea, lamb, sherbet, vegetables, desserts, carrots, cabbage Mustard, Dry or Seed-Cheese, eggs, meats, vegetables, poultry Nutmeg-Baked goods, fruit, chicken, eggs, vegetables, desserts Onion Powder-Meat, fish, poultry, vegetables, cheese, eggs, bread, rice salads (Do not use   Onion salt) Orange Extract-Desserts, baked goods Oregano-Pasta, eggs, cheese, onions, pork, lamb, fish, chicken, vegetables, green salads Paprika-Meat, fish, poultry, eggs, cheese, vegetables Parsley Flakes-Butter, vegetables, meat fish, poultry, eggs, bread, salads (certain forms may   Contain sodium Pepper-Meat fish, poultry, vegetables, eggs Peppermint Extract-Desserts, baked goods Poppy Seed-Eggs, bread, cheese, fruit dressings, baked goods, noodles, vegetables,  cottage  [employer], poultry, meat, fish, cauliflower, turnips,eggs bread Saffron-Rice, bread, veal, chicken, fish, eggs Sage-Meat, fish, poultry, onions, eggplant, tomateos, pork, stews Savory-Eggs, salads, poultry, meat, rice, vegetables, soups, pork Tarragon-Meat, poultry, fish, eggs, butter, vegetables (licorice-like flavor)  Thyme-Meat, poultry, fish, eggs, vegetables, (clover-like flavor), sauces, soups Tumeric-Salads, butter, eggs, fish, rice, vegetables (saffron-like flavor) Vanilla Extract-Baked goods, candy Vinegar-Salads, vegetables, meat marinades Walnut Extract-baked goods, candy   2. Choose your Foods Wisely   The following is a list of foods to avoid which are high in sodium:  Meats-Avoid all smoked, canned, salt cured, dried and kosher meat and fish as well as Anchovies   [employer] meats:Bologna, Liverwurst, Pastrami Canned meat or fish  Marinated herring Caviar    Pepperoni Corned Beef   Pizza Dried chipped beef  Salami Frozen breaded fish or meat Salt pork Frankfurters or hot dogs  Sardines Gefilte fish   Sausage Ham (boiled ham, Proscuitto Smoked butt    spiced ham)   Spam      TV Dinners Vegetables Canned vegetables (Regular) Relish Canned mushrooms  Sauerkraut Olives    Tomato juice Pickles  Bakery and Dessert Products Canned puddings  Cream pies Cheesecake   Decorated cakes Cookies  Beverages/Juices Tomato juice, regular  Gatorade   V-8 vegetable juice, regular  Breads and Cereals Biscuit mixes   Salted potato chips, corn chips, pretzels Bread stuffing mixes  Salted crackers and rolls Pancake and waffle mixes Self-rising flour  Seasonings Accent    Meat sauces Barbecue sauce  Meat tenderizer Catsup    Monosodium glutamate (MSG) Celery salt   Onion salt Chili sauce   Prepared mustard Garlic salt   Salt, seasoned salt, sea salt Gravy mixes   Soy sauce Horseradish   Steak  sauce Ketchup   Tartar sauce Lite salt    Teriyaki sauce Marinade mixes   Worcestershire sauce  Others Baking powder   Cocoa and cocoa mixes Baking soda   Commercial casserole mixes Candy-caramels, chocolate  Dehydrated soups    Bars, fudge,nougats  Instant rice and pasta mixes Canned broth or soup  Maraschino cherries Cheese, aged and processed cheese and cheese spreads  Learning Assessment Quiz  Indicated T (for True) or F (for False) for each of the following statements:  _____ Fresh fruits and vegetables and unprocessed grains are generally low in sodium _____ Water may contain a considerable amount of sodium, depending on the source _____ You can always tell if a food is high in sodium by tasting it _____ Certain laxatives my be high in sodium and should be avoided unless prescribed   by a physician or pharmacist _____ Salt substitutes may be used freely by anyone on a sodium restricted diet _____ Sodium is present in table salt, food additives and as a natural component of   most foods _____ Table salt is approximately 90% sodium _____ Limiting sodium intake may help prevent excess fluid accumulation in the body _____ On a sodium-restricted diet, seasonings such as bouillon soy sauce, and    cooking wine should be used in place of table salt _____ On an ingredient list, a product which lists monosodium glutamate as the first   ingredient is an appropriate food to include on a low sodium diet  Circle the best answer(s) to the following statements (Hint: there may be more than one correct answer)  11. On a low-sodium diet, some acceptable snack items are:    A. Olives  F. Bean dip   K. Grapefruit juice    B. Salted Pretzels G. Commercial Popcorn   L. Canned peaches    C. Carrot Sticks  H. Bouillon   M. Unsalted nuts   D. [place] fries  I. Peanut butter crackers N. Salami   E. Sweet pickles J. Tomato Juice   O. Pizza  12.  Seasonings that may be used freely on a reduced -  sodium diet include   A. Lemon wedges F.Monosodium glutamate K. Celery seed    B.Soysauce   G. Pepper   L. Mustard powder   C. Sea salt  H. Cooking wine  M. Onion flakes   D. Vinegar  E. Prepared horseradish N. Salsa   E. Sage   J. Worcestershire sauce  O. [address]     Elson Clan, PA-C  03/23/2021 9:07 AM    [facility] [address] [place], [place], [place]  [address] Phone: [phone]; Fax: [phone]

## 2021-03-18 ENCOUNTER — Ambulatory Visit (INDEPENDENT_AMBULATORY_CARE_PROVIDER_SITE_OTHER): Payer: BC Managed Care – PPO | Admitting: Podiatry

## 2021-03-18 ENCOUNTER — Other Ambulatory Visit: Payer: Self-pay

## 2021-03-18 DIAGNOSIS — M2041 Other hammer toe(s) (acquired), right foot: Secondary | ICD-10-CM

## 2021-03-18 DIAGNOSIS — L97413 Non-pressure chronic ulcer of right heel and midfoot with necrosis of muscle: Secondary | ICD-10-CM

## 2021-03-18 NOTE — Progress Notes (Signed)
  Subjective:  Patient ID: Austin Shaw, male    DOB: 27-Mar-1966,  MRN: [id number]  Chief Complaint  Patient presents with   Diabetic Ulcer      wound care right foot   DOS: 07/21/20 Procedure: Right foot 1st toe amputation  DOS: 07/23/20 Procedure: Right foot delayed wound closure with partial metatarsal resection  DOS: 10/15/20 Procedure: Right foot debridement and irrigation, application of skin graft substitute  DOS: 11/19/20 Procedure: Right foot debridement and irrigation, application of skin graft substitute  55 y.o. male presents with the above complaint. History confirmed with patient. Thinks the toe is doing well. Denies new issues. Still wearing surgical shoe. Objective:  Physical Exam: no tenderness at the surgical site, local edema noted and calf supple, nontender. Incision: right foot wound with coapted skin edges. No warmth, erythema, SOI. Residual wound with epithelializing base. Superficial distal 2nd toe ulceration 0.4 cm without warmth, erythema SOI, wound base mildly fibrotic. Hammertoe right 2nd toe  Assessment:   1. Diabetic ulcer of right midfoot associated with type 2 diabetes mellitus, with necrosis of muscle (HCC)   2. Hammertoe of right foot     Plan:  Patient was evaluated and treated and all questions answered.  Ulcer right foot -Ulcer slighly larger than previous but without SOI  Right 2nd hammertoe -Given persistent ulceration will proceed with flexor tenotomy today. Discussed r/b of procedure. Consent form reviewed and signed.   Procedure: Flexor Tenotomy Indication for Procedure: toe with semi-reducible hammertoe with distal tip ulceration. Flexor tenotomy indicated to alleviate contracture, reduce pressure, and enhance healing of the ulceration. Location: right, 2nd toe Anesthesia: Lidocaine 1% plain; 1.5 mL and Marcaine 0.5% plain; 1.5 mL digital block Instrumentation: 18 g needle  Technique: The toe was anesthetized as above and prepped  in the usual fashion. The toe was exsanquinated and a tourniquet was secured at the base of the toe. An 18g needle was then used to percutaneously release the flexor tendon at the plantar surface of the toe with noted release of the hammertoe deformity. The incision was then dressed with antibiotic ointment and band-aid. Compression splint dressing applied. Patient tolerated the procedure well. Dressing: Dry, sterile, compression dressing. Disposition: Patient tolerated procedure well. Patient to return in 1 week for follow-up.   Return in about 2 weeks (around 04/01/2021) for Wound Care.

## 2021-03-22 ENCOUNTER — Other Ambulatory Visit: Payer: Self-pay | Admitting: Family Medicine

## 2021-03-23 ENCOUNTER — Other Ambulatory Visit: Payer: Self-pay

## 2021-03-23 ENCOUNTER — Ambulatory Visit (INDEPENDENT_AMBULATORY_CARE_PROVIDER_SITE_OTHER): Payer: BC Managed Care – PPO | Admitting: Physician Assistant

## 2021-03-23 ENCOUNTER — Encounter: Payer: Self-pay | Admitting: Physician Assistant

## 2021-03-23 VITALS — BP 178/102 | HR 85 | Ht 69.5 in | Wt 350.0 lb

## 2021-03-23 DIAGNOSIS — I5023 Acute on chronic systolic (congestive) heart failure: Secondary | ICD-10-CM

## 2021-03-23 DIAGNOSIS — G4733 Obstructive sleep apnea (adult) (pediatric): Secondary | ICD-10-CM

## 2021-03-23 DIAGNOSIS — E785 Hyperlipidemia, unspecified: Secondary | ICD-10-CM

## 2021-03-23 DIAGNOSIS — I739 Peripheral vascular disease, unspecified: Secondary | ICD-10-CM

## 2021-03-23 DIAGNOSIS — I10 Essential (primary) hypertension: Secondary | ICD-10-CM

## 2021-03-23 DIAGNOSIS — I428 Other cardiomyopathies: Secondary | ICD-10-CM

## 2021-03-23 LAB — BASIC METABOLIC PANEL
BUN/Creatinine Ratio: 13 (ref 9–20)
BUN: 15 mg/dL (ref 6–24)
CO2: 27 mmol/L (ref 20–29)
Calcium: 9.2 mg/dL (ref 8.7–10.2)
Chloride: 100 mmol/L (ref 96–106)
Creatinine, Ser: 1.14 mg/dL (ref 0.76–1.27)
Glucose: 181 mg/dL — ABNORMAL HIGH (ref 70–99)
Potassium: 4.5 mmol/L (ref 3.5–5.2)
Sodium: 139 mmol/L (ref 134–144)
eGFR: 77 mL/min/{1.73_m2} (ref >59)

## 2021-03-23 MED ORDER — ENTRESTO 24-26 MG PO TABS
1.0000 | ORAL_TABLET | Freq: Two times a day (BID) | ORAL | 11 refills | Status: DC
Start: 2021-03-23 — End: 2021-06-17

## 2021-03-23 NOTE — Patient Instructions (Addendum)
Medication Instructions:  1.Stop Losartan 2.Start Entresto 24/26 mg, take one tablet by mouth twice a day. Take first dose tonight 3.Take furosemide (Lasix) 40 mg daily *If you need a refill on your cardiac medications before your next appointment, please call your pharmacy*   Lab Work: BMET today and in 2 weeks If you have labs (blood work) drawn today and your tests are completely normal, you will receive your results only by: MyChart Message (if you have MyChart) OR A paper copy in the mail If you have any lab test that is abnormal or we need to change your treatment, we will call you to review the results.   Follow-Up: At [facility], you and your health needs are our priority.  As part of our continuing mission to provide you with exceptional heart care, we have created designated Provider Care Teams.  These Care Teams include your primary Cardiologist (physician) and Advanced Practice Providers (APPs -  Physician Assistants and Nurse Practitioners) who all work together to provide you with the care you need, when you need it.     Your next appointment:   2-3week(s) with one of our pharmacists here in the office     Other Instructions Two Gram Sodium Diet 2000 mg  What is Sodium? Sodium is a mineral found naturally in many foods. The most significant source of sodium in the diet is table salt, which is about 40% sodium.  Processed, convenience, and preserved foods also contain a large amount of sodium.  The body needs only 500 mg of sodium daily to function,  A normal diet provides more than enough sodium even if you do not use salt.  Why Limit Sodium? A build up of sodium in the body can cause thirst, increased blood pressure, shortness of breath, and water retention.  Decreasing sodium in the diet can reduce edema and risk of heart attack or stroke associated with high blood pressure.  Keep in mind that there are many other factors involved in these health problems.  Heredity,  obesity, lack of exercise, cigarette smoking, stress and what you eat all play a role.  General Guidelines: Do not add salt at the table or in cooking.  One teaspoon of salt contains over 2 grams of sodium. Read food labels Avoid processed and convenience foods Ask your dietitian before eating any foods not dicussed in the menu planning guidelines Consult your physician if you wish to use a salt substitute or a sodium containing medication such as antacids.  Limit milk and milk products to 16 oz (2 cups) per day.  Shopping Hints: READ LABELS!! "Dietetic" does not necessarily mean low sodium. Salt and other sodium ingredients are often added to foods during processing.    Menu Planning Guidelines Food Group Choose More Often Avoid  Beverages (see also the milk group All fruit juices, low-sodium, salt-free vegetables juices, low-sodium carbonated beverages Regular vegetable or tomato juices, commercially softened water used for drinking or cooking  Breads and Cereals Enriched white, wheat, rye and pumpernickel bread, hard rolls and dinner rolls; muffins, cornbread and waffles; most dry cereals, cooked cereal without added salt; unsalted crackers and breadsticks; low sodium or homemade bread crumbs Bread, rolls and crackers with salted tops; quick breads; instant hot cereals; pancakes; commercial bread stuffing; self-rising flower and biscuit mixes; regular bread crumbs or cracker crumbs  Desserts and Sweets Desserts and sweets mad with mild should be within allowance Instant pudding mixes and cake mixes  Fats Butter or margarine; vegetable oils;  unsalted salad dressings, regular salad dressings limited to 1 Tbs; light, sour and heavy cream Regular salad dressings containing bacon fat, bacon bits, and salt pork; snack dips made with instant soup mixes or processed cheese; salted nuts  Fruits Most fresh, frozen and canned fruits Fruits processed with salt or sodium-containing ingredient (some dried  fruits are processed with sodium sulfites        Vegetables Fresh, frozen vegetables and low- sodium canned vegetables Regular canned vegetables, sauerkraut, pickled vegetables, and others prepared in brine; frozen vegetables in sauces; vegetables seasoned with ham, bacon or salt pork  Condiments, Sauces, Miscellaneous  Salt substitute with physician's approval; pepper, herbs, spices; vinegar, lemon or lime juice; hot pepper sauce; garlic powder, onion powder, low sodium soy sauce (1 Tbs.); low sodium condiments (ketchup, chili sauce, mustard) in limited amounts (1 tsp.) fresh ground horseradish; unsalted tortilla chips, pretzels, potato chips, popcorn, salsa (1/4 cup) Any seasoning made with salt including garlic salt, celery salt, onion salt, and seasoned salt; sea salt, rock salt, kosher salt; meat tenderizers; monosodium glutamate; mustard, regular soy sauce, barbecue, sauce, chili sauce, teriyaki sauce, steak sauce, Worcestershire sauce, and most flavored vinegars; canned gravy and mixes; regular condiments; salted snack foods, olives, picles, relish, horseradish sauce, catsup   Food preparation: Try these seasonings Meats:    Pork Sage, onion Serve with applesauce  Chicken Poultry seasoning, thyme, parsley Serve with cranberry sauce  Lamb Curry powder, rosemary, garlic, thyme Serve with mint sauce or jelly  Veal Marjoram, basil Serve with current jelly, cranberry sauce  Beef Pepper, bay leaf Serve with dry mustard, unsalted chive butter  Fish [place] leaf, dill Serve with unsalted lemon butter, unsalted parsley butter  Vegetables:    Asparagus Lemon juice   Broccoli Lemon juice   Carrots Mustard dressing parsley, mint, nutmeg, glazed with unsalted butter and sugar   Green beans Marjoram, lemon juice, nutmeg,dill seed   Tomatoes Basil, marjoram, onion   Spice /blend for [employer]" 4 tsp ground thyme 1 tsp ground sage 3 tsp ground rosemary 4 tsp ground marjoram   Test your  knowledge A product that says "Salt Free" may still contain sodium. True or False Garlic Powder and Hot Pepper Sauce an be used as alternative seasonings.True or False Processed foods have more sodium than fresh foods.  True or False Canned Vegetables have less sodium than froze True or False   WAYS TO DECREASE YOUR SODIUM INTAKE Avoid the use of added salt in cooking and at the table.  Table salt (and other prepared seasonings which contain salt) is probably one of the greatest sources of sodium in the diet.  Unsalted foods can gain flavor from the sweet, sour, and butter taste sensations of herbs and spices.  Instead of using salt for seasoning, try the following seasonings with the foods listed.  Remember: how you use them to enhance natural food flavors is limited only by your creativity... Allspice-Meat, fish, eggs, fruit, peas, red and yellow vegetables Almond Extract-Fruit baked goods Anise Seed-Sweet breads, fruit, carrots, beets, cottage cheese, cookies (tastes like licorice) Basil-Meat, fish, eggs, vegetables, rice, vegetables salads, soups, sauces Bay Leaf-Meat, fish, stews, poultry Burnet-Salad, vegetables (cucumber-like flavor) Caraway Seed-Bread, cookies, cottage cheese, meat, vegetables, cheese, rice Cardamon-Baked goods, fruit, soups Celery Powder or seed-Salads, salad dressings, sauces, meatloaf, soup, bread.Do not use  celery salt Chervil-Meats, salads, fish, eggs, vegetables, cottage cheese (parsley-like flavor) Chili Power-Meatloaf, chicken cheese, corn, eggplant, egg dishes Chives-Salads cottage cheese, egg dishes, soups, vegetables, sauces Cilantro-Salsa, casseroles  Cinnamon-Baked goods, fruit, pork, lamb, chicken, carrots Cloves-Fruit, baked goods, fish, pot roast, green beans, beets, carrots Coriander-Pastry, cookies, meat, salads, cheese (lemon-orange flavor) Cumin-Meatloaf, fish,cheese, eggs, cabbage,fruit pie (caraway flavor) [employer], fruit, eggs, fish,  poultry, cottage cheese, vegetables Dill Seed-Meat, cottage cheese, poultry, vegetables, fish, salads, bread Fennel Seed-Bread, cookies, apples, pork, eggs, fish, beets, cabbage, cheese, Licorice-like flavor Garlic-(buds or powder) Salads, meat, poultry, fish, bread, butter, vegetables, potatoes.Do not  use garlic salt Ginger-Fruit, vegetables, baked goods, meat, fish, poultry Horseradish Root-Meet, vegetables, butter Lemon Juice or Extract-Vegetables, fruit, tea, baked goods, fish salads Mace-Baked goods fruit, vegetables, fish, poultry (taste like nutmeg) Maple Extract-Syrups Marjoram-Meat, chicken, fish, vegetables, breads, green salads (taste like Sage) Mint-Tea, lamb, sherbet, vegetables, desserts, carrots, cabbage Mustard, Dry or Seed-Cheese, eggs, meats, vegetables, poultry Nutmeg-Baked goods, fruit, chicken, eggs, vegetables, desserts Onion Powder-Meat, fish, poultry, vegetables, cheese, eggs, bread, rice salads (Do not use   Onion salt) Orange Extract-Desserts, baked goods Oregano-Pasta, eggs, cheese, onions, pork, lamb, fish, chicken, vegetables, green salads Paprika-Meat, fish, poultry, eggs, cheese, vegetables Parsley Flakes-Butter, vegetables, meat fish, poultry, eggs, bread, salads (certain forms may   Contain sodium Pepper-Meat fish, poultry, vegetables, eggs Peppermint Extract-Desserts, baked goods Poppy Seed-Eggs, bread, cheese, fruit dressings, baked goods, noodles, vegetables, cottage  [employer], poultry, meat, fish, cauliflower, turnips,eggs bread Saffron-Rice, bread, veal, chicken, fish, eggs Sage-Meat, fish, poultry, onions, eggplant, tomateos, pork, stews Savory-Eggs, salads, poultry, meat, rice, vegetables, soups, pork Tarragon-Meat, poultry, fish, eggs, butter, vegetables (licorice-like flavor)  Thyme-Meat, poultry, fish, eggs, vegetables, (clover-like flavor), sauces, soups Tumeric-Salads, butter, eggs, fish, rice,  vegetables (saffron-like flavor) Vanilla Extract-Baked goods, candy Vinegar-Salads, vegetables, meat marinades Walnut Extract-baked goods, candy   2. Choose your Foods Wisely   The following is a list of foods to avoid which are high in sodium:  Meats-Avoid all smoked, canned, salt cured, dried and kosher meat and fish as well as Anchovies   [employer] meats:Bologna, Liverwurst, Pastrami Canned meat or fish  Marinated herring Caviar    Pepperoni Corned Beef   Pizza Dried chipped beef  Salami Frozen breaded fish or meat Salt pork Frankfurters or hot dogs  Sardines Gefilte fish   Sausage Ham (boiled ham, Proscuitto Smoked butt    spiced ham)   Spam      TV Dinners Vegetables Canned vegetables (Regular) Relish Canned mushrooms  Sauerkraut Olives    Tomato juice Pickles  Bakery and Dessert Products Canned puddings  Cream pies Cheesecake   Decorated cakes Cookies  Beverages/Juices Tomato juice, regular  Gatorade   V-8 vegetable juice, regular  Breads and Cereals Biscuit mixes   Salted potato chips, corn chips, pretzels Bread stuffing mixes  Salted crackers and rolls Pancake and waffle mixes Self-rising flour  Seasonings Accent    Meat sauces Barbecue sauce  Meat tenderizer Catsup    Monosodium glutamate (MSG) Celery salt   Onion salt Chili sauce   Prepared mustard Garlic salt   Salt, seasoned salt, sea salt Gravy mixes   Soy sauce Horseradish   Steak sauce Ketchup   Tartar sauce Lite salt    Teriyaki sauce Marinade mixes   Worcestershire sauce  Others Baking powder   Cocoa and cocoa mixes Baking soda   Commercial casserole mixes Candy-caramels, chocolate  Dehydrated soups    Bars, fudge,nougats  Instant rice and pasta mixes Canned broth or soup  Maraschino cherries Cheese, aged and processed cheese and cheese spreads  Learning Assessment Quiz  Indicated T (for True)  or F (for False) for each of the following statements:  _____ Fresh fruits and  vegetables and unprocessed grains are generally low in sodium _____ Water may contain a considerable amount of sodium, depending on the source _____ You can always tell if a food is high in sodium by tasting it _____ Certain laxatives my be high in sodium and should be avoided unless prescribed   by a physician or pharmacist _____ Salt substitutes may be used freely by anyone on a sodium restricted diet _____ Sodium is present in table salt, food additives and as a natural component of   most foods _____ Table salt is approximately 90% sodium _____ Limiting sodium intake may help prevent excess fluid accumulation in the body _____ On a sodium-restricted diet, seasonings such as bouillon soy sauce, and    cooking wine should be used in place of table salt _____ On an ingredient list, a product which lists monosodium glutamate as the first   ingredient is an appropriate food to include on a low sodium diet  Circle the best answer(s) to the following statements (Hint: there may be more than one correct answer)  11. On a low-sodium diet, some acceptable snack items are:    A. Olives  F. Bean dip   K. Grapefruit juice    B. Salted Pretzels G. Commercial Popcorn   L. Canned peaches    C. Carrot Sticks  H. Bouillon   M. Unsalted nuts   D. [place] fries  I. Peanut butter crackers N. Salami   E. Sweet pickles J. Tomato Juice   O. Pizza  12.  Seasonings that may be used freely on a reduced - sodium diet include   A. Lemon wedges F.Monosodium glutamate K. Celery seed    B.Soysauce   G. Pepper   L. Mustard powder   C. Sea salt  H. Cooking wine  M. Onion flakes   D. Vinegar  E. Prepared horseradish N. Salsa   E. Sage   J. Worcestershire sauce  O. Chutney

## 2021-03-24 ENCOUNTER — Encounter: Payer: Self-pay | Admitting: Cardiovascular Disease

## 2021-04-01 ENCOUNTER — Other Ambulatory Visit: Payer: Self-pay

## 2021-04-01 ENCOUNTER — Ambulatory Visit (INDEPENDENT_AMBULATORY_CARE_PROVIDER_SITE_OTHER): Payer: BC Managed Care – PPO | Admitting: Podiatry

## 2021-04-01 ENCOUNTER — Ambulatory Visit: Payer: BC Managed Care – PPO

## 2021-04-01 DIAGNOSIS — L97412 Non-pressure chronic ulcer of right heel and midfoot with fat layer exposed: Secondary | ICD-10-CM | POA: Diagnosis not present

## 2021-04-01 DIAGNOSIS — E1151 Type 2 diabetes mellitus with diabetic peripheral angiopathy without gangrene: Secondary | ICD-10-CM | POA: Diagnosis not present

## 2021-04-01 DIAGNOSIS — E11621 Type 2 diabetes mellitus with foot ulcer: Secondary | ICD-10-CM

## 2021-04-01 DIAGNOSIS — M2142 Flat foot [pes planus] (acquired), left foot: Secondary | ICD-10-CM | POA: Diagnosis not present

## 2021-04-01 DIAGNOSIS — Z9889 Other specified postprocedural states: Secondary | ICD-10-CM

## 2021-04-01 DIAGNOSIS — M2042 Other hammer toe(s) (acquired), left foot: Secondary | ICD-10-CM

## 2021-04-01 DIAGNOSIS — M2041 Other hammer toe(s) (acquired), right foot: Secondary | ICD-10-CM | POA: Diagnosis not present

## 2021-04-01 DIAGNOSIS — M2141 Flat foot [pes planus] (acquired), right foot: Secondary | ICD-10-CM | POA: Diagnosis not present

## 2021-04-01 DIAGNOSIS — I739 Peripheral vascular disease, unspecified: Secondary | ICD-10-CM | POA: Diagnosis not present

## 2021-04-01 NOTE — Progress Notes (Signed)
SITUATION: Reason for Visit: Fitting and Delivery of Custom Fabricated Foot Orthoses Patient Report: Patient reports comfort and is satisfied with device.  OBJECTIVE DATA: Patient History / Diagnosis:     ICD-10-CM   1. Diabetes mellitus type 2 with peripheral artery disease (HCC)  E11.51     2. Post-operative state  Z98.890       Provided Device:  Custom foot orthotics with toe filler  GOAL OF ORTHOSIS - Improve gait - Decrease energy expenditure - Improve Balance - Provide Triplanar stability of foot complex - Facilitate motion  ACTIONS PERFORMED Patient was fit with foot orthotics with toe filler trimmed to shoe last. Patient tolerated fittign procedure. Device was modified as follows to better fit patient: - Toe plate was trimmed to shoe last  Patient was provided with verbal and written instruction and demonstration regarding donning, doffing, wear, care, proper fit, function, purpose, cleaning, and use of the orthosis and in all related precautions and risks and benefits regarding the orthosis.  Patient was also provided with verbal instruction regarding how to report any failures or malfunctions of the orthosis and necessary follow up care. Patient was also instructed to contact our office regarding any change in status that may affect the function of the orthosis.  Patient demonstrated independence with proper donning, doffing, and fit and verbalized understanding of all instructions.  PLAN: Patient is to follow up in one week or as necessary (PRN). All questions were answered and concerns addressed. Plan of care was discussed with and agreed upon by the patient.

## 2021-04-01 NOTE — Progress Notes (Signed)
°  Subjective:  Patient ID: Austin Shaw, male    DOB: 03/13/1966,  MRN: [id number]  Chief Complaint  Patient presents with   Diabetic Ulcer     wound care**seeing Arlys John @ 815 if ok to dispense orthotics   DOS: 07/21/20 Procedure: Right foot 1st toe amputation  DOS: 07/23/20 Procedure: Right foot delayed wound closure with partial metatarsal resection  DOS: 10/15/20 Procedure: Right foot debridement and irrigation, application of skin graft substitute  DOS: 11/19/20 Procedure: Right foot debridement and irrigation, application of skin graft substitute  55 y.o. male presents with the above complaint. History confirmed with patient. Not sure if much has changed with the toe, definitely not worse. Wearing Crocs today d/t inclement weather. Objective:  Physical Exam: no tenderness at the surgical site, local edema noted and calf supple, nontender. Incision: right foot wound with coapted skin edges. No warmth, erythema, SOI. Residual wound with epithelializing base. Superficial distal 2nd toe ulceration 0.4 cm without warmth, erythema SOI, wound base mildly fibrotic. Hammertoe right 2nd toe  Assessment:   1. PAD (peripheral artery disease) (HCC)   2. Diabetes mellitus type 2 with peripheral artery disease (HCC)   3. Hammer toes of both feet   4. Pes planus of both feet   5. Diabetic ulcer of right midfoot associated with type 2 diabetes mellitus, with fat layer exposed (HCC)     Plan:  Patient was evaluated and treated and all questions answered.  Ulcer right foot -Ulcer similar in size to previous. Debrided today. Delayed bleeding noted. Will re-order vascular studies to assess for change in vascular status. He had vascular intervention in April, with some residual stenosis but no further intervention was indicated at that time.  Procedure: Selective Debridement of Wound Rationale: Removal of devitalized tissue from the wound to promote healing.  Pre-Debridement Wound  Measurements: 0.4 cm x 0.4 cm x 0.2 cm  Post-Debridement Wound Measurements: same as pre-debridement. Type of Debridement: sharp selective Instrumentation: dermal curette, tissue nipper Tissue Removed: Devitalized soft-tissue Dressing: Dry, sterile, compression dressing. Disposition: Patient tolerated procedure well.  Right 2nd hammertoe -Toe more rectus today -Dispensed DM accomodative inserts with toe filler.  No follow-ups on file.

## 2021-04-06 ENCOUNTER — Other Ambulatory Visit: Payer: BC Managed Care – PPO

## 2021-04-07 ENCOUNTER — Other Ambulatory Visit: Payer: BC Managed Care – PPO

## 2021-04-08 ENCOUNTER — Ambulatory Visit (HOSPITAL_COMMUNITY)
Admission: RE | Admit: 2021-04-08 | Discharge: 2021-04-08 | Disposition: A | Discharge status: Home / Self Care | Payer: BC Managed Care – PPO | Source: Ambulatory Visit | Attending: Podiatry | Admitting: Podiatry

## 2021-04-08 ENCOUNTER — Other Ambulatory Visit: Payer: Self-pay

## 2021-04-08 ENCOUNTER — Encounter: Payer: Self-pay | Admitting: Family Medicine

## 2021-04-08 DIAGNOSIS — I739 Peripheral vascular disease, unspecified: Secondary | ICD-10-CM | POA: Insufficient documentation

## 2021-04-12 ENCOUNTER — Other Ambulatory Visit: Payer: Self-pay

## 2021-04-12 ENCOUNTER — Other Ambulatory Visit: Payer: BC Managed Care – PPO | Admitting: *Deleted

## 2021-04-12 ENCOUNTER — Encounter: Payer: Self-pay | Admitting: *Deleted

## 2021-04-12 DIAGNOSIS — I739 Peripheral vascular disease, unspecified: Secondary | ICD-10-CM

## 2021-04-12 DIAGNOSIS — I10 Essential (primary) hypertension: Secondary | ICD-10-CM

## 2021-04-12 DIAGNOSIS — I428 Other cardiomyopathies: Secondary | ICD-10-CM

## 2021-04-12 DIAGNOSIS — G4733 Obstructive sleep apnea (adult) (pediatric): Secondary | ICD-10-CM

## 2021-04-12 DIAGNOSIS — I5042 Chronic combined systolic (congestive) and diastolic (congestive) heart failure: Secondary | ICD-10-CM

## 2021-04-12 DIAGNOSIS — E7800 Pure hypercholesterolemia, unspecified: Secondary | ICD-10-CM

## 2021-04-12 DIAGNOSIS — I5023 Acute on chronic systolic (congestive) heart failure: Secondary | ICD-10-CM

## 2021-04-12 DIAGNOSIS — E785 Hyperlipidemia, unspecified: Secondary | ICD-10-CM

## 2021-04-12 NOTE — Telephone Encounter (Signed)
Please send the order.  Thanks.  

## 2021-04-13 LAB — HEPATIC FUNCTION PANEL
ALT: 24 IU/L (ref 0–44)
AST: 23 IU/L (ref 0–40)
Albumin: 3.9 g/dL (ref 3.8–4.9)
Alkaline Phosphatase: 76 IU/L (ref 44–121)
Bilirubin Total: 0.3 mg/dL (ref 0.0–1.2)
Bilirubin, Direct: 0.12 mg/dL (ref 0.00–0.40)
Total Protein: 7.1 g/dL (ref 6.0–8.5)

## 2021-04-13 LAB — BASIC METABOLIC PANEL
BUN/Creatinine Ratio: 12 (ref 9–20)
BUN: 15 mg/dL (ref 6–24)
CO2: 26 mmol/L (ref 20–29)
Calcium: 9.3 mg/dL (ref 8.7–10.2)
Chloride: 98 mmol/L (ref 96–106)
Creatinine, Ser: 1.23 mg/dL (ref 0.76–1.27)
Glucose: 194 mg/dL — ABNORMAL HIGH (ref 70–99)
Potassium: 5 mmol/L (ref 3.5–5.2)
Sodium: 139 mmol/L (ref 134–144)
eGFR: 70 mL/min/{1.73_m2} (ref >59)

## 2021-04-13 LAB — LIPID PANEL
Chol/HDL Ratio: 3.6 ratio (ref 0.0–5.0)
Cholesterol, Total: 190 mg/dL (ref 100–199)
HDL: 53 mg/dL (ref >39)
LDL Chol Calc (NIH): 116 mg/dL — ABNORMAL HIGH (ref 0–99)
Triglycerides: 117 mg/dL (ref 0–149)
VLDL Cholesterol Cal: 21 mg/dL (ref 5–40)

## 2021-04-14 ENCOUNTER — Telehealth: Payer: Self-pay

## 2021-04-14 ENCOUNTER — Other Ambulatory Visit: Payer: Self-pay

## 2021-04-14 ENCOUNTER — Ambulatory Visit: Payer: BC Managed Care – PPO | Admitting: Pharmacist

## 2021-04-14 VITALS — BP 132/70 | HR 78 | Wt 348.0 lb

## 2021-04-14 DIAGNOSIS — I10 Essential (primary) hypertension: Secondary | ICD-10-CM

## 2021-04-14 DIAGNOSIS — E782 Mixed hyperlipidemia: Secondary | ICD-10-CM | POA: Diagnosis not present

## 2021-04-14 MED ORDER — DAPAGLIFLOZIN PROPANEDIOL 10 MG PO TABS: 10.0000 mg | ORAL_TABLET | Freq: Every day | ORAL | 3 refills | Status: DC

## 2021-04-14 MED ORDER — CLOPIDOGREL BISULFATE 75 MG PO TABS: 75.0000 mg | ORAL_TABLET | Freq: Every day | ORAL | 3 refills | Status: DC

## 2021-04-14 MED ORDER — CARVEDILOL 25 MG PO TABS: 25.0000 mg | ORAL_TABLET | Freq: Two times a day (BID) | ORAL | 3 refills | Status: DC

## 2021-04-14 NOTE — Telephone Encounter (Signed)
Patient is coming in on 1/10

## 2021-04-14 NOTE — Progress Notes (Signed)
Patient ID: ADHIRAJ YUILL                 DOB: 05-Mar-1966                      MRN: [id number]     HPI: Austin Shaw is a 56 y.o. male patient referred by Dr Angelena Form and Ermalinda Barrios, PA to pharmacy clinic for HF and hyperlipidemia medication management. 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 Most recent LVEF 45-50% on 03/01/21, previously 20-25% in 08/2014 and 50-55% in 11/2014.  Patient saw Dr. Angelena Form 02/07/21 to reestablish with cardiology. His Lasix was increased to 40mg  daily and he was changed from Lipitor to Crestor secondary to Lipitor intolerance (dizziness).  Saw Ermalinda Barrios on 12/14. He had been taking Lasix 40 mg alternating with 20 mg every other day and weight was up 9 lbs. BP was elevated at 178/102, but had missed his meds in the prior 24 hours. Losartan was discontinued and Entresto 24/36 mg was initiated. Lasix again increased to 40 mg daily. F/u BMET stable. LDL had improved from 156 to 114 after starting Crestor 10mg  daily.   Abnormal imaging on LE doppler noted on 04/12/20, f/u appt made with Dr. Fletcher Anon on 1/10.  Today pt returns in good spirits. Pt tolerates medications well, reports some nausea that improves with food. Denies HA, blurry vision, swelling, SOB, chest pain. Able to complete all ADLs. Sometimes misses 2nd dose of hydralazine but reports compliance with other medications. Does not monitor blood pressure at home. Has taken morning meds this AM. BP much lower at this visit (132/70), reports white coat hyprtension at last visit that he came alone to vs with family support. Pt tolerating first month of Entresto and discussed ability to afford Entresto with $10 co-pay card. Tolerating rosuvastatin well. Does not recall why he stopped simvastatin in 2012. Did experience dizziness with atorvastatin.  Current CHF meds:  Entresto 24-26 mg BID, carvedilol 12.5 BID w/ meals, hydralazine 25 mg TID, furosemide 40 mg daily  Previously tried: losartan  BP goal: <130/37mmHg  Family History: The patient's  family history includes Congestive Heart Failure in an other family member; Diabetes in his father and another family member; Lupus in his daughter; Sjogren's syndrome in his daughter.   Social History: no smoking, no alcohol consumption  Exercise: Work 45 hours/week, limited time to exercise  Home BP readings: N/A  Wt Readings from Last 3 Encounters:  03/23/21 (!) 350 lb (158.8 kg)  02/25/21 (!) 351 lb (159.2 kg)  02/07/21 (!) 341 lb 12.8 oz (155 kg)   BP Readings from Last 3 Encounters:  03/23/21 (!) 178/102  02/25/21 (!) 209/105  02/07/21 (!) 148/80   Pulse Readings from Last 3 Encounters:  03/23/21 85  02/25/21 84  02/07/21 77    Renal function: CrCl cannot be calculated (Unknown ideal weight.).  Past Medical History:  Diagnosis Date   Acute osteomyelitis of toe ([place]) 08/26/2020   Allergy    CHF (congestive heart failure) ([place])    Diabetes mellitus    type 2   E coli infection 09/20/2020   Group B streptococcal infection 09/20/2020   Hyperlipidemia    Hypertension    Hypertension    Morbid obesity (HCC)    BMI 50   Peripheral vascular disease (HCC)    Sleep apnea  pt states he no longer has sleep apnea since tonsillectomy    Current Outpatient Medications on File Prior to Visit  Medication Sig Dispense Refill   amoxicillin-clavulanate (AUGMENTIN) 875-125 MG tablet Take 1 tablet by mouth 2 (two) times daily. 60 tablet 3   carvedilol (COREG) 12.5 MG tablet TAKE 1 TABLET (12.5 MG TOTAL) BY MOUTH 2 (TWO) TIMES DAILY WITH A MEAL. 180 tablet 0   clopidogrel (PLAVIX) 75 MG tablet Take 1 tablet (75 mg total) by mouth daily. Must keep upcoming appt for continuation of refills 90 tablet 0   Continuous Blood Gluc Sensor (FREESTYLE LIBRE 14 DAY SENSOR) MISC Use to check blood sugars  daily. Dx: E11.9 1 each 12   Continuous Blood Gluc Sensor (FREESTYLE LIBRE 2 SENSOR) MISC Please specify directions, refills and quantity 2 each 2   Dulaglutide (TRULICITY) A999333 0000000 SOPN Inject 0.75 mg into the skin once a week. Every Friday in the evening     furosemide (LASIX) 40 MG tablet Take 40 mg by mouth daily.     glucose blood test strip CHECK BLOOD SUGAR TWICE DAILY 100 each 3   hydrALAZINE (APRESOLINE) 25 MG tablet TAKE 1 TABLET BY MOUTH THREE TIMES A DAY 270 tablet 1   metFORMIN (GLUCOPHAGE) 500 MG tablet Take 2 tablets (1000 mg) by mouth in the morning and take 1 tablet (500 mg) by mouth in the evening     oxyCODONE-acetaminophen (PERCOCET) 5-325 MG tablet Take 1 tablet by mouth every 4 (four) hours as needed for severe pain. 20 tablet 0   rosuvastatin (CRESTOR) 10 MG tablet Take 1 tablet (10 mg total) by mouth daily. 90 tablet 3   sacubitril-valsartan (ENTRESTO) 24-26 MG Take 1 tablet by mouth 2 (two) times daily. 60 tablet 11   silver sulfADIAZINE (SILVADENE) 1 % cream Apply pea-sized amount to wound daily. 50 g 0   tobramycin (TOBREX) 0.3 % ophthalmic solution Place 1 drop into both eyes See admin instructions. Instill 1 drop both eyes 3 times daily the day before eye injection every 6-8 weeks     No current facility-administered medications on file prior to visit.    Allergies  Allergen Reactions   Lipitor [Atorvastatin] Other (See Comments)    Lightheaded & dizzy     Assessment/Plan:  1. CHF: BP lowered and almost to goal <130/45mmHg. For optimization of GDMT for HFmrEF (LVEF 45-50%), will initiate Farxiga 10 mg daily and increase carvedilol to 25 mg BID. Sent in copay card for Farxiga to bring copay to $0 (ID [id number], BIN [id number], PCN [id number], GRP EC [id number]). Continue Entresto 24-26mg  BID and hydralazine 25 mg TID. Unable to titrate Entresto dose or add spironolactone due to K of 5. For swelling, continue Lasix 40 mg daily. Rechecking labs in 6 weeks.  2. HLD: LDL  decreased greatly with addition of rosuvastatin 10mg  daily (156 --> 116), but not at goal of <55. Discussed increasing rosuvastatin dose or adding on PCSK9i therapy. Pt declines both options today and prefers to focus on lifestyle improvements.  Will continue rosuvastatin 10 mg daily and re-assess lipid panel in 6 weeks. Will revisit above med changes at that time assuming LDL will remain above goal.  Pt canceled f/u PAD visit with Dr Fletcher Anon that had been scheduled on 1/10. States he only prefers to follow up with Dr Angelena Form.  Jethro Poling, PharmD Candidate [phone] aided in this visit.  Megan E. Supple, PharmD, BCACP, [place] [id number], [place]  [id number] Phone: [phone]; Fax: [phone] 04/14/2021 4:44 PM

## 2021-04-14 NOTE — Telephone Encounter (Signed)
-----   Message from Ricci Barker, RN sent at 04/14/2021  9:14 AM EST ----- Regarding: Urgent appointment with Dr. Fletcher Anon This is a [place] patient that Dr. Fletcher Anon needs to see as soon as possible for his abnormal doppler results (and he was never seen post procedure). I have called him and left a message but have not heard back. I am off the rest of this week.  Can someone please call him and get him in either this week or early next week with Dr. Fletcher Anon?  Thank you, Lattie Haw

## 2021-04-14 NOTE — Telephone Encounter (Signed)
Noted. Thanks.

## 2021-04-14 NOTE — Patient Instructions (Addendum)
It was nice to see you today!  Your blood pressure goal is < 130/66mmHg  Increase your carvedilol to 25mg  - 1 tablet twice daily. You can use up your current tablets and take 2 of the 12.5mg  tablets twice daily until you run out  Start taking Farxiga - 1 tablet once daily in the morning  Recheck fasting labs on Monday, February 20th any time after 7:30am  Schedule follow up with Dr Angelena Form

## 2021-04-15 MED ORDER — FREESTYLE LIBRE 2 SENSOR MISC: 2 refills | Status: DC

## 2021-04-15 MED ORDER — FREESTYLE LIBRE 14 DAY SENSOR MISC: 12 refills | Status: DC

## 2021-04-19 ENCOUNTER — Ambulatory Visit: Payer: BC Managed Care – PPO | Admitting: Cardiovascular Disease

## 2021-04-19 NOTE — Telephone Encounter (Addendum)
Called and spoke with the patient. Educated the patient on the importance of f/u with Dr. Kirke Corin for PV for management of his PVD. Adv the patient that based on his recent abnormal dopplers he will need to be seen soon.  Advised the patient that Dr. Clifton James would continue to be his primary cardiologist and would continue to manage his cardiac issues.  Patient sts that he does not want to f/u with Dr. Kirke Corin and would prefer to f/u with a different PV physician in [place]. Patient is requesting switching providers from Dr. Kirke Corin to Dr. Allyson Sabal at [facility]. Adv the patient that I will need to fwd the message to both doctors for approval. Once approved a scheduler will call him to schedule the appt with Dr. Allyson Sabal.  Patient verbalized understanding and voiced appreciation for the call.

## 2021-04-19 NOTE — Telephone Encounter (Signed)
Thank you, please send to me when we get the approval from Dr. Fletcher Anon and I will schedule him with Dr. Gwenlyn Found. :)

## 2021-04-19 NOTE — Telephone Encounter (Signed)
Fine with me

## 2021-04-19 NOTE — Telephone Encounter (Signed)
Runell Gess, MD  You Just now (9:32 AM)   Fine with me

## 2021-04-19 NOTE — Telephone Encounter (Signed)
Austin Barker, RN  Wellington Hampshire, MD; Lamar Laundry, RN He canceled his appointment with you (Dr. Fletcher Anon) for tomorrow. The appt note states that he wants to only see Dr. Angelena Form. It was canceled by Fuller Canada, pharmcist. The patient made need to be educated as to why he needs to follow up with PV-thanks

## 2021-04-25 ENCOUNTER — Encounter: Payer: Self-pay | Admitting: Cardiovascular Disease

## 2021-04-29 ENCOUNTER — Ambulatory Visit: Payer: BC Managed Care – PPO | Admitting: Podiatry

## 2021-04-29 ENCOUNTER — Other Ambulatory Visit: Payer: Self-pay

## 2021-04-29 DIAGNOSIS — E11621 Type 2 diabetes mellitus with foot ulcer: Secondary | ICD-10-CM

## 2021-04-29 DIAGNOSIS — L97412 Non-pressure chronic ulcer of right heel and midfoot with fat layer exposed: Secondary | ICD-10-CM

## 2021-05-02 ENCOUNTER — Other Ambulatory Visit: Payer: BC Managed Care – PPO

## 2021-05-06 NOTE — Progress Notes (Signed)
°  Subjective:  Patient ID: Austin Shaw, male    DOB: 08-Jul-1965,  MRN: [id number]  Chief Complaint  Patient presents with   Wound Check    Patients states wound looks about the same as it did during the last visit   DOS: 07/21/20 Procedure: Right foot 1st toe amputation  DOS: 07/23/20 Procedure: Right foot delayed wound closure with partial metatarsal resection  DOS: 10/15/20 Procedure: Right foot debridement and irrigation, application of skin graft substitute  DOS: 11/19/20 Procedure: Right foot debridement and irrigation, application of skin graft substitute  56 y.o. male presents with the above complaint. History confirmed with patient.  Thinks the wound is looking about the same. Denies drainage. Pain level 0/10. Oral temp 97.0 Objective:  Physical Exam: no tenderness at the surgical site, local edema noted and calf supple, nontender. Incision: right foot wound with coapted skin edges. No warmth, erythema, SOI. Residual wound with epithelializing base. Superficial distal 2nd toe ulceration 0.4 cm without warmth, erythema SOI, wound base mildly fibrotic. Hammertoe right 2nd toe  Assessment:   1. Diabetic ulcer of right midfoot associated with type 2 diabetes mellitus, with fat layer exposed (HCC)      Plan:  Patient was evaluated and treated and all questions answered.  Ulcer right foot -Improving, almost healed. Minimally debrided callus. Continue inserts and daily dressing changes. F/u in 3 months for recheck.  Return in about 3 weeks (around 05/20/2021) for Wound Care.

## 2021-05-09 ENCOUNTER — Other Ambulatory Visit: Payer: Self-pay

## 2021-05-09 ENCOUNTER — Ambulatory Visit: Payer: BC Managed Care – PPO | Admitting: Infectious Disease

## 2021-05-09 ENCOUNTER — Encounter: Payer: Self-pay | Admitting: Infectious Disease

## 2021-05-09 VITALS — BP 183/102 | HR 79 | Temp 98.1°F | Ht 69.0 in | Wt 351.6 lb

## 2021-05-09 DIAGNOSIS — M86179 Other acute osteomyelitis, unspecified ankle and foot: Secondary | ICD-10-CM

## 2021-05-09 DIAGNOSIS — A491 Streptococcal infection, unspecified site: Secondary | ICD-10-CM | POA: Diagnosis not present

## 2021-05-09 DIAGNOSIS — A498 Other bacterial infections of unspecified site: Secondary | ICD-10-CM

## 2021-05-09 DIAGNOSIS — I429 Cardiomyopathy, unspecified: Secondary | ICD-10-CM | POA: Diagnosis not present

## 2021-05-09 DIAGNOSIS — E118 Type 2 diabetes mellitus with unspecified complications: Secondary | ICD-10-CM

## 2021-05-09 DIAGNOSIS — I739 Peripheral vascular disease, unspecified: Secondary | ICD-10-CM

## 2021-05-09 NOTE — Progress Notes (Signed)
Subjective:    Chief complaint follow-up for diabetic foot infection with elevated blood pressure in the context of not having taken his medications today   Patient ID: Austin Shaw, male    DOB: [birth year], 56 y.o.   MRN: [id number]  HPI  56 y.o. male with these mellitus peripheral vascular disease status post revascularization with gangrenous toe with osteomyelitis status post I&D of bursal collection and amputation of first toe then repeat trip to the OR for debridement and resection with cultures from both surgeries yielding group B streptococcus and E. coli that was fairly sensitive.  We  placed him on IV cefazolin along with oral metronidazole.   He had been taking both medications religiously including the metronidazole which she tries to take as quickly as possible to avoid the unpleasant taste gives and ones mouth.   I saw him last on May 19 that was encouraged by the appearance of his wound but discouraged that his inflammatory markers had increased.   We switched him from IV ceftriaxone and oral metronidazole to Augmentin.  Has been taking that since then.  Unfortunately his wound required further debridement and he underwent debridement irrigation of his right foot wound on the third June by Dr. March Rummage but with placement of skin graft over the wound apparently there have been concerned that there will be need for bone biopsy but this was not performed.  Then he had apparent failure of the graft and need for further debridement and placement of a new skin graft.  The last saw him he has had several debridements but his wound is healing up quite nicely with just 1 residual open area.   His last visit I was going to stop his antibiotics was inflammatory markers in particular his sed rate had markedly elevated.  He is continued on Augmentin since then and been following with Dr. March Rummage.  She had some debridement of callus when seen by Dr. March Rummage in 29 April 2021.       Past Medical History:  Diagnosis Date   Acute osteomyelitis of toe ([place]) 08/26/2020   Allergy    CHF (congestive heart failure) ([place])    Diabetes mellitus    type 2   E coli infection 09/20/2020   Group B streptococcal infection 09/20/2020   Hyperlipidemia    Hypertension    Hypertension    Morbid obesity (HCC)    BMI 50   Peripheral vascular disease (HCC)    Sleep apnea    pt states he no longer has sleep apnea since tonsillectomy    Past Surgical History:  Procedure Laterality Date   ABDOMINAL AORTOGRAM W/LOWER EXTREMITY N/A 07/14/2020   Procedure: ABDOMINAL AORTOGRAM W/LOWER EXTREMITY;  Surgeon: Wellington Hampshire, MD;  Location: [facility];  Service: Cardiovascular;  Laterality: N/A;   AMPUTATION Right 07/21/2020   Procedure: AMPUTATION RIGHT GREAT TOE;  Surgeon: Evelina Bucy, DPM;  Location: [facility];  Service: Podiatry;  Laterality: Right;   GRAFT APPLICATION Right [id number]   Procedure: GRAFT APPLICATION;  Surgeon: Evelina Bucy, DPM;  Location: [facility];  Service: Podiatry;  Laterality: Right;   IRRIGATION AND DEBRIDEMENT FOOT Right 07/21/2020   Procedure: IRRIGATION AND DEBRIDEMENT RIGHT GREAT TOE;  Surgeon: Evelina Bucy, DPM;  Location: [facility];  Service: Podiatry;  Laterality: Right;   METATARSAL HEAD EXCISION Right 07/23/2020   Procedure: METATARSAL HEAD EXCISION;  Surgeon: Evelina Bucy, DPM;  Location: [facility];  Service: Podiatry;  Laterality: Right;  TONSILLECTOMY     WOUND DEBRIDEMENT Right 07/23/2020   Procedure: DEBRIDEMENT WOUND WITH DELAYED WOUND CLOSURE;  Surgeon: Evelina Bucy, DPM;  Location: [facility];  Service: Podiatry;  Laterality: Right;   WOUND DEBRIDEMENT Right 09/17/2020   Procedure: DEBRIDEMENT OF RIGHT FOOT WOUND, PARTIAL WOUND CLOSURE, APPLICATION OF SKIN GRAFT SUBSTITUTE;  Surgeon: Evelina Bucy, DPM;  Location: [facility];  Service: Podiatry;  Laterality: Right;   WOUND DEBRIDEMENT Right 10/15/2020   Procedure: DEBRIDEMENT  WOUND with Application of skin graft substitute;  Surgeon: Evelina Bucy, DPM;  Location: [facility];  Service: Podiatry;  Laterality: Right;   WOUND DEBRIDEMENT Right 11/19/2020   Procedure: DEBRIDEMENT WOUND;  Surgeon: Evelina Bucy, DPM;  Location: [facility];  Service: Podiatry;  Laterality: Right;    Family History  Problem Relation Age of Onset   Diabetes Father    Diabetes Other        fam hx 1st degree relative   Congestive Heart Failure Other    Lupus Daughter    Sjogren's syndrome Daughter    Colon cancer Neg Hx    Prostate cancer Neg Hx    Esophageal cancer Neg Hx    Rectal cancer Neg Hx       Social History   Socioeconomic History   Marital status: Married    Spouse name: Not on file   Number of children: Not on file   Years of education: Not on file   Highest education level: Not on file  Occupational History   Occupation: Passenger transport manager    Employer: [employer]  Tobacco Use   Smoking status: Never   Smokeless tobacco: Never  Vaping Use   Vaping Use: Never used  Substance and Sexual Activity   Alcohol use: No    Alcohol/week: 0.0 standard drinks   Drug use: No   Sexual activity: Not on file  Other Topics Concern   Not on file  Social History Narrative   Working at [employer].    4 kids   Married, [place]   Social Determinants of Radio broadcast assistant Strain: Not on [employer] Insecurity: Not on file  Transportation Needs: Not on file  Physical Activity: Not on file  Stress: Not on file  Social Connections: Not on file    Allergies  Allergen Reactions   Lipitor [Atorvastatin] Other (See Comments)    Lightheaded & dizzy     Current Outpatient Medications:    amoxicillin-clavulanate (AUGMENTIN) 875-125 MG tablet, Take 1 tablet by mouth 2 (two) times daily., Disp: 60 tablet, Rfl: 3   carvedilol (COREG) 25 MG tablet, Take 1 tablet (25 mg total) by mouth 2 (two) times daily with a meal., Disp: 180 tablet, Rfl: 3    clopidogrel (PLAVIX) 75 MG tablet, Take 1 tablet (75 mg total) by mouth daily., Disp: 90 tablet, Rfl: 3   Continuous Blood Gluc Sensor (FREESTYLE LIBRE 14 DAY SENSOR) MISC, Use to check blood sugars daily. Dx: E11.9, Disp: 1 each, Rfl: 12   Continuous Blood Gluc Sensor (FREESTYLE LIBRE 2 SENSOR) MISC, Please specify directions, refills and quantity, Disp: 2 each, Rfl: 2   dapagliflozin propanediol (FARXIGA) 10 MG TABS tablet, Take 1 tablet (10 mg total) by mouth daily before breakfast., Disp: 90 tablet, Rfl: 3   Dulaglutide (TRULICITY) A999333 0000000 SOPN, Inject 0.75 mg into the skin once a week. Every Friday in the evening, Disp: , Rfl:    furosemide (LASIX) 40  MG tablet, Take 40 mg by mouth daily., Disp: , Rfl:    glucose blood test strip, CHECK BLOOD SUGAR TWICE DAILY, Disp: 100 each, Rfl: 3   hydrALAZINE (APRESOLINE) 25 MG tablet, TAKE 1 TABLET BY MOUTH THREE TIMES A DAY, Disp: 270 tablet, Rfl: 1   metFORMIN (GLUCOPHAGE) 500 MG tablet, Take 2 tablets (1000 mg) by mouth in the morning and take 1 tablet (500 mg) by mouth in the evening, Disp: , Rfl:    oxyCODONE-acetaminophen (PERCOCET) 5-325 MG tablet, Take 1 tablet by mouth every 4 (four) hours as needed for severe pain., Disp: 20 tablet, Rfl: 0   rosuvastatin (CRESTOR) 10 MG tablet, Take 1 tablet (10 mg total) by mouth daily., Disp: 90 tablet, Rfl: 3   sacubitril-valsartan (ENTRESTO) 24-26 MG, Take 1 tablet by mouth 2 (two) times daily., Disp: 60 tablet, Rfl: 11   silver sulfADIAZINE (SILVADENE) 1 % cream, Apply pea-sized amount to wound daily., Disp: 50 g, Rfl: 0   tobramycin (TOBREX) 0.3 % ophthalmic solution, Place 1 drop into both eyes See admin instructions. Instill 1 drop both eyes 3 times daily the day before eye injection every 6-8 weeks, Disp: , Rfl:    Review of Systems  Constitutional:  Negative for activity change, appetite change, chills, diaphoresis, fatigue, fever and unexpected weight change.  HENT:  Negative for congestion,  rhinorrhea, sinus pressure, sneezing, sore throat and trouble swallowing.   Eyes:  Negative for photophobia and visual disturbance.  Respiratory:  Negative for cough, chest tightness, shortness of breath, wheezing and stridor.   Cardiovascular:  Negative for chest pain, palpitations and leg swelling.  Gastrointestinal:  Negative for abdominal distention, abdominal pain, anal bleeding, blood in stool, constipation, diarrhea, nausea and vomiting.  Genitourinary:  Negative for difficulty urinating, dysuria, flank pain and hematuria.  Musculoskeletal:  Negative for arthralgias, back pain, gait problem, joint swelling and myalgias.  Skin:  Negative for color change, pallor, rash and wound.  Neurological:  Negative for dizziness, tremors, weakness and light-headedness.  Hematological:  Negative for adenopathy. Does not bruise/bleed easily.  Psychiatric/Behavioral:  Negative for agitation, behavioral problems, confusion, decreased concentration, dysphoric mood and sleep disturbance.       Objective:   Physical Exam Constitutional:      Appearance: He is well-developed.  HENT:     Head: Normocephalic and atraumatic.  Eyes:     Conjunctiva/sclera: Conjunctivae normal.  Cardiovascular:     Rate and Rhythm: Normal rate and regular rhythm.  Pulmonary:     Effort: Pulmonary effort is normal. No respiratory distress.     Breath sounds: No wheezing.  Abdominal:     General: There is no distension.     Palpations: Abdomen is soft.  Musculoskeletal:        General: No tenderness. Normal range of motion.     Cervical back: Normal range of motion and neck supple.  Skin:    General: Skin is warm and dry.     Coloration: Skin is not pale.     Findings: No erythema or rash.  Neurological:     General: No focal deficit present.     Mental Status: He is alert and oriented to person, place, and time.  Psychiatric:        Mood and Affect: Mood normal.        Behavior: Behavior normal.        Thought  Content: Thought content normal.        Judgment: Judgment normal.   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  Completed ceftriaxone and metronidazole followed by extended Augmentin.  I will repeat sed rate CRP BMP CBC differential today.  Hopefully his inflammatory markers are reassuring and we can stop by oral antibiotics and observe him in 2 months time off antibiotics.   Onychomycosis: I will prescribe him Lamisil for 3 months Congestive heart failure: Following with cardiology  Diabetes mellitus: Last A1c reviewed and had come down to 7.6 when checked in September 2022   Hypertension again poorly controlled.  He said he did not take his blood pressure medicines today and was also nervous coming to clinic.

## 2021-05-10 ENCOUNTER — Encounter: Payer: Self-pay | Admitting: Infectious Disease

## 2021-05-10 LAB — CBC WITH DIFFERENTIAL/PLATELET
Absolute Monocytes: 538 cells/uL (ref 200–950)
Basophils Absolute: 59 cells/uL (ref 0–200)
Basophils Relative: 0.7 %
Eosinophils Absolute: 361 cells/uL (ref 15–500)
Eosinophils Relative: 4.3 %
HCT: 41.6 % (ref 38.5–50.0)
Hemoglobin: 13.5 g/dL (ref 13.2–17.1)
Lymphs Abs: 2520 cells/uL (ref 850–3900)
MCH: 29.3 pg (ref 27.0–33.0)
MCHC: 32.5 g/dL (ref 32.0–36.0)
MCV: 90.4 fL (ref 80.0–100.0)
MPV: 12 fL (ref 7.5–12.5)
Monocytes Relative: 6.4 %
Neutro Abs: 4922 cells/uL (ref 1500–7800)
Neutrophils Relative %: 58.6 %
Platelets: 197 10*3/uL (ref 140–400)
RBC: 4.6 10*6/uL (ref 4.20–5.80)
RDW: 13 % (ref 11.0–15.0)
Total Lymphocyte: 30 %
WBC: 8.4 10*3/uL (ref 3.8–10.8)

## 2021-05-10 LAB — BASIC METABOLIC PANEL WITH GFR
BUN/Creatinine Ratio: 22 (calc) (ref 6–22)
BUN: 31 mg/dL — ABNORMAL HIGH (ref 7–25)
CO2: 30 mmol/L (ref 20–32)
Calcium: 9.4 mg/dL (ref 8.6–10.3)
Chloride: 102 mmol/L (ref 98–110)
Creat: 1.42 mg/dL — ABNORMAL HIGH (ref 0.70–1.30)
Glucose, Bld: 248 mg/dL — ABNORMAL HIGH (ref 65–99)
Potassium: 4.6 mmol/L (ref 3.5–5.3)
Sodium: 140 mmol/L (ref 135–146)
eGFR: 59 mL/min/{1.73_m2} — ABNORMAL LOW (ref >=60)

## 2021-05-10 LAB — C-REACTIVE PROTEIN: CRP: 8.7 mg/L — ABNORMAL HIGH (ref <8.0)

## 2021-05-10 LAB — SEDIMENTATION RATE: Sed Rate: 50 mm/h — ABNORMAL HIGH (ref 0–20)

## 2021-05-11 ENCOUNTER — Encounter: Payer: Self-pay | Admitting: Family Medicine

## 2021-05-11 ENCOUNTER — Telehealth: Payer: Self-pay | Admitting: Family Medicine

## 2021-05-11 NOTE — Telephone Encounter (Signed)
Called pt. No answer sent mychart letter

## 2021-05-11 NOTE — Telephone Encounter (Signed)
-----   Message from Joaquim Nam, MD sent at 05/08/2021  7:59 PM EST ----- Regarding: Please schedule DM2 f/u.  We can do labs at the visit.  Thanks. Please schedule DM2 f/u.  We can do labs at the visit.  Thanks.

## 2021-05-15 ENCOUNTER — Other Ambulatory Visit: Payer: Self-pay | Admitting: Family Medicine

## 2021-05-16 ENCOUNTER — Other Ambulatory Visit: Payer: Self-pay | Admitting: Infectious Disease

## 2021-05-16 MED ORDER — TERBINAFINE HCL 250 MG PO TABS: 250.0000 mg | ORAL_TABLET | Freq: Every day | ORAL | 2 refills | Status: DC

## 2021-05-17 ENCOUNTER — Encounter: Payer: Self-pay | Admitting: Cardiovascular Disease

## 2021-05-17 ENCOUNTER — Ambulatory Visit: Payer: BC Managed Care – PPO | Admitting: Cardiovascular Disease

## 2021-05-17 ENCOUNTER — Other Ambulatory Visit: Payer: Self-pay

## 2021-05-17 VITALS — BP 153/84 | HR 74 | Ht 69.0 in | Wt 347.4 lb

## 2021-05-17 DIAGNOSIS — I739 Peripheral vascular disease, unspecified: Secondary | ICD-10-CM

## 2021-05-17 DIAGNOSIS — I10 Essential (primary) hypertension: Secondary | ICD-10-CM | POA: Diagnosis not present

## 2021-05-17 NOTE — Addendum Note (Signed)
Addended by: Bernita Buffy on: 05/17/2021 04:13 PM   Modules accepted: Orders

## 2021-05-17 NOTE — Assessment & Plan Note (Signed)
Mr. Degraaf is switching providers from Dr. Fletcher Anon for management of his PAD.  He had a complex peripheral intervention by Dr. Fletcher Anon 07/14/2020. His angiogram showed an occluded below the knee popliteal artery and tibial peroneal trunk.  He underwent orbital atherectomy followed by drug-coated balloon angioplasty.  He had inline flow via the peroneal down to the ankle.  His AT and PT filled by collaterals at the level of ankle.  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 March Rummage, podiatry, and is ultimately healed.  He does have some diabetic peripheral neuropathy.  He denies claudication.  We will get surveillance lower extremities show Doppler studies on annual basis.

## 2021-05-17 NOTE — Patient Instructions (Signed)
Medication Instructions:  Your physician recommends that you continue on your current medications as directed. Please refer to the Current Medication list given to you today.  *If you need a refill on your cardiac medications before your next appointment, please call your pharmacy*   Testing/Procedures: Your physician has requested that you have a lower extremity arterial duplex. This test is an ultrasound of the arteries in the legs. It looks at arterial blood flow in the legs. Allow one hour for Lower Arterial scans. There are no restrictions or special instructions  Your physician has requested that you have an ankle brachial index (ABI). During this test an ultrasound and blood pressure cuff are used to evaluate the arteries that supply the arms and legs with blood. Allow thirty minutes for this exam. There are no restrictions or special instructions. To be done in April. These procedures are done at [address] [facility]   Follow-Up: At [facility], you and your health needs are our priority.  As part of our continuing mission to provide you with exceptional heart care, we have created designated Provider Care Teams.  These Care Teams include your primary Cardiologist (physician) and Advanced Practice Providers (APPs -  Physician Assistants and Nurse Practitioners) who all work together to provide you with the care you need, when you need it.  We recommend signing up for the patient portal called "MyChart".  Sign up information is provided on this After Visit Summary.  MyChart is used to connect with patients for Virtual Visits (Telemedicine).  Patients are able to view lab/test results, encounter notes, upcoming appointments, etc.  Non-urgent messages can be sent to your provider as well.   To learn more about what you can do with MyChart, go to [email or web address].    Your next appointment:   12 month(s)  The format for your next appointment:   In Person  Provider:    Nanetta Batty, MD

## 2021-05-17 NOTE — Progress Notes (Signed)
05/17/2021 Austin Shaw   [birth year]  [id number]  Primary Physician Austin Ghent, MD Primary Cardiologist: Austin Harp MD Austin Shaw, [place]  HPI:  Austin Shaw is a 56 y.o. severely overweight married African-American male father of 102, grandfather of 3 grandchildren who was switching providers from Austin Shaw  to manage his PAD.  His primary cardiologist is Austin Shaw and his PCP Austin Shaw.  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  His risk factors include treated diabetes, hypertension and hyperlipidemia.  He is never smoked.  He denies chest pain or shortness of breath.  Is never had a myocardial infarction.  There is no family history.  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 Dr. Fletcher Shaw  on 07/14/2020.  Ultimately he had amputation which ultimately healed Right great toe amputation by Dr. March Shaw which has healed.  Current Meds  Medication Sig   amoxicillin-clavulanate (AUGMENTIN) 875-125 MG tablet Take 1 tablet by mouth 2 (two) times daily.   carvedilol (COREG) 25 MG tablet Take 1 tablet (25 mg total) by mouth 2 (two) times daily with a meal.   clopidogrel (PLAVIX) 75 MG tablet Take 1 tablet (75 mg total) by mouth daily.   dapagliflozin propanediol (FARXIGA) 10 MG TABS tablet Take 1 tablet (10 mg total) by mouth daily before breakfast.   Dulaglutide (TRULICITY) A999333 0000000 SOPN Inject 0.75 mg into the skin once a week. Every Friday in the evening   furosemide (LASIX) 40 MG tablet Take 40 mg by mouth daily.   glucose blood test strip CHECK BLOOD SUGAR TWICE DAILY   hydrALAZINE (APRESOLINE) 25 MG tablet TAKE 1 TABLET BY MOUTH THREE TIMES A DAY   metFORMIN (GLUCOPHAGE) 500 MG tablet TAKE 1 TABLET BY MOUTH TWICE A DAY THEN INCREASE TO 2 TABS IN MORNING AND 1 TAB EVENING   oxyCODONE-acetaminophen (PERCOCET) 5-325  MG tablet Take 1 tablet by mouth every 4 (four) hours as needed for severe pain.   rosuvastatin (CRESTOR) 10 MG tablet Take 1 tablet (10 mg total) by mouth daily.   sacubitril-valsartan (ENTRESTO) 24-26 MG Take 1 tablet by mouth 2 (two) times daily.   silver sulfADIAZINE (SILVADENE) 1 % cream Apply pea-sized amount to wound daily.   terbinafine (LAMISIL) 250 MG tablet Take 1 tablet (250 mg total) by mouth daily.   tobramycin (TOBREX) 0.3 % ophthalmic solution Place 1 drop into both eyes See admin instructions. Instill 1 drop both eyes 3 times daily the day before eye injection every 6-8 weeks     Allergies  Allergen Reactions   Lipitor [Atorvastatin] Other (See Comments)    Lightheaded & dizzy    Social History   Socioeconomic History   Marital status: Married    Spouse name: Not on file   Number of children: Not on file   Years of education: Not on file   Highest education level: Not on file  Occupational History   Occupation: Passenger transport manager    Employer: [employer]  Tobacco Use   Smoking status: Never   Smokeless tobacco: Never  Vaping Use   Vaping Use: Never used  Substance and Sexual Activity   Alcohol use: No    Alcohol/week: 0.0 standard drinks   Drug use: No   Sexual activity: Not on file  Other Topics Concern   Not on file  Social History Narrative   Working at [employer].    4 kids   Married, 1998   From [place] Strain: Not on [employer] Insecurity: Not on file  Transportation Needs: Not on file  Physical Activity: Not on file  Stress: Not on file  Social Connections: Not on file  Intimate Partner Violence: Not on file     Review of Systems: General: negative for chills, fever, night sweats or weight changes.  Cardiovascular: negative for chest pain, dyspnea on exertion, edema, orthopnea, palpitations, paroxysmal nocturnal dyspnea or shortness of breath Dermatological: negative for  rash Respiratory: negative for cough or wheezing Urologic: negative for hematuria Abdominal: negative for nausea, vomiting, diarrhea, bright red blood per rectum, melena, or hematemesis Neurologic: negative for visual changes, syncope, or dizziness All other systems reviewed and are otherwise negative except as noted above.    Blood pressure (!) 153/84, pulse 74, height 5\' 9"  (1.753 m), weight (!) 347 lb 6.4 oz (157.6 kg), SpO2 97 %.  General appearance: alert and no distress Neck: no adenopathy, no carotid bruit, no JVD, supple, symmetrical, trachea midline, and thyroid not enlarged, symmetric, no tenderness/mass/nodules Lungs: clear to auscultation bilaterally Heart: regular rate and rhythm, S1, S2 normal, no murmur, click, rub or gallop Extremities: extremities normal, atraumatic, no cyanosis or edema Pulses: Diminished pedal pulses Skin: Skin color, texture, turgor normal. No rashes or lesions Neurologic: Grossly normal  EKG sinus rhythm at 74 with borderline voltage for LVH.  I personally reviewed this EKG.  ASSESSMENT AND PLAN:   PVD (peripheral vascular disease) [facility]) Austin Shaw is switching providers from Austin Shaw for management of his PAD.  He had a complex peripheral intervention by Austin Shaw 07/14/2020. His angiogram showed an occluded below the knee popliteal artery and tibial peroneal trunk.  He underwent orbital atherectomy followed by drug-coated balloon angioplasty.  He had inline flow via the peroneal down to the ankle.  His AT and PT filled by collaterals at the level of ankle.  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 March Shaw, podiatry, and is ultimately healed.  He does have some diabetic peripheral neuropathy.  He denies claudication.  We will get surveillance lower extremities show Doppler studies on annual basis.     Austin Harp MD [place] 05/17/2021 4:03 PM

## 2021-05-27 ENCOUNTER — Ambulatory Visit: Payer: BC Managed Care – PPO | Admitting: Podiatry

## 2021-05-30 ENCOUNTER — Other Ambulatory Visit: Payer: BC Managed Care – PPO

## 2021-05-31 NOTE — Addendum Note (Signed)
Addended by: Jaelin Fackler E on: 05/31/2021 02:02 PM   Modules accepted: Orders

## 2021-06-01 ENCOUNTER — Other Ambulatory Visit: Payer: Self-pay

## 2021-06-01 ENCOUNTER — Other Ambulatory Visit: Payer: BC Managed Care – PPO | Admitting: *Deleted

## 2021-06-01 ENCOUNTER — Ambulatory Visit: Payer: BC Managed Care – PPO | Admitting: Podiatry

## 2021-06-01 DIAGNOSIS — I10 Essential (primary) hypertension: Secondary | ICD-10-CM

## 2021-06-01 DIAGNOSIS — E782 Mixed hyperlipidemia: Secondary | ICD-10-CM

## 2021-06-04 LAB — COMPREHENSIVE METABOLIC PANEL
ALT: 17 IU/L (ref 0–44)
AST: 20 IU/L (ref 0–40)
Albumin/Globulin Ratio: 1.2 (ref 1.2–2.2)
Albumin: 4 g/dL (ref 3.8–4.9)
Alkaline Phosphatase: 69 IU/L (ref 44–121)
BUN/Creatinine Ratio: 16 (ref 9–20)
BUN: 18 mg/dL (ref 6–24)
Bilirubin Total: 0.4 mg/dL (ref 0.0–1.2)
CO2: 26 mmol/L (ref 20–29)
Calcium: 9.6 mg/dL (ref 8.7–10.2)
Chloride: 100 mmol/L (ref 96–106)
Creatinine, Ser: 1.12 mg/dL (ref 0.76–1.27)
Globulin, Total: 3.3 g/dL (ref 1.5–4.5)
Glucose: 158 mg/dL — ABNORMAL HIGH (ref 70–99)
Potassium: 4.6 mmol/L (ref 3.5–5.2)
Sodium: 141 mmol/L (ref 134–144)
Total Protein: 7.3 g/dL (ref 6.0–8.5)
eGFR: 78 mL/min/{1.73_m2} (ref >59)

## 2021-06-04 LAB — LIPID PANEL
Chol/HDL Ratio: 3.4 ratio (ref 0.0–5.0)
Cholesterol, Total: 171 mg/dL (ref 100–199)
HDL: 50 mg/dL (ref >39)
LDL Chol Calc (NIH): 99 mg/dL (ref 0–99)
Triglycerides: 126 mg/dL (ref 0–149)
VLDL Cholesterol Cal: 22 mg/dL (ref 5–40)

## 2021-06-06 ENCOUNTER — Emergency Department (HOSPITAL_COMMUNITY)
Admission: EM | Admit: 2021-06-06 | Discharge: 2021-06-06 | Disposition: A | Discharge status: Home / Self Care | Payer: BC Managed Care – PPO | Attending: Emergency Medicine | Admitting: Emergency Medicine

## 2021-06-06 ENCOUNTER — Emergency Department (HOSPITAL_COMMUNITY): Payer: BC Managed Care – PPO

## 2021-06-06 ENCOUNTER — Other Ambulatory Visit: Payer: Self-pay

## 2021-06-06 ENCOUNTER — Encounter (HOSPITAL_COMMUNITY): Payer: Self-pay

## 2021-06-06 ENCOUNTER — Encounter: Payer: Self-pay | Admitting: Cardiovascular Disease

## 2021-06-06 DIAGNOSIS — R1011 Right upper quadrant pain: Secondary | ICD-10-CM | POA: Diagnosis present

## 2021-06-06 DIAGNOSIS — I110 Hypertensive heart disease with heart failure: Secondary | ICD-10-CM | POA: Insufficient documentation

## 2021-06-06 DIAGNOSIS — I509 Heart failure, unspecified: Secondary | ICD-10-CM | POA: Diagnosis not present

## 2021-06-06 DIAGNOSIS — R748 Abnormal levels of other serum enzymes: Secondary | ICD-10-CM | POA: Insufficient documentation

## 2021-06-06 DIAGNOSIS — R7989 Other specified abnormal findings of blood chemistry: Secondary | ICD-10-CM | POA: Diagnosis not present

## 2021-06-06 DIAGNOSIS — K8020 Calculus of gallbladder without cholecystitis without obstruction: Secondary | ICD-10-CM | POA: Diagnosis not present

## 2021-06-06 DIAGNOSIS — E1165 Type 2 diabetes mellitus with hyperglycemia: Secondary | ICD-10-CM | POA: Insufficient documentation

## 2021-06-06 DIAGNOSIS — R739 Hyperglycemia, unspecified: Secondary | ICD-10-CM

## 2021-06-06 DIAGNOSIS — R7401 Elevation of levels of liver transaminase levels: Secondary | ICD-10-CM | POA: Insufficient documentation

## 2021-06-06 DIAGNOSIS — Z7984 Long term (current) use of oral hypoglycemic drugs: Secondary | ICD-10-CM | POA: Diagnosis not present

## 2021-06-06 DIAGNOSIS — Z79899 Other long term (current) drug therapy: Secondary | ICD-10-CM | POA: Diagnosis not present

## 2021-06-06 DIAGNOSIS — R109 Unspecified abdominal pain: Secondary | ICD-10-CM

## 2021-06-06 LAB — CBC
HCT: 42.6 % (ref 39.0–52.0)
Hemoglobin: 13.5 g/dL (ref 13.0–17.0)
MCH: 29 pg (ref 26.0–34.0)
MCHC: 31.7 g/dL (ref 30.0–36.0)
MCV: 91.4 fL (ref 80.0–100.0)
Platelets: 235 10*3/uL (ref 150–400)
RBC: 4.66 MIL/uL (ref 4.22–5.81)
RDW: 13.3 % (ref 11.5–15.5)
WBC: 8.5 10*3/uL (ref 4.0–10.5)
nRBC: 0 % (ref 0.0–0.2)

## 2021-06-06 LAB — HEPATIC FUNCTION PANEL
ALT: 21 U/L (ref 0–44)
AST: 52 U/L — ABNORMAL HIGH (ref 15–41)
Albumin: 2.9 g/dL — ABNORMAL LOW (ref 3.5–5.0)
Alkaline Phosphatase: 54 U/L (ref 38–126)
Bilirubin, Direct: 0.9 mg/dL — ABNORMAL HIGH (ref 0.0–0.2)
Indirect Bilirubin: 1.3 mg/dL — ABNORMAL HIGH (ref 0.3–0.9)
Total Bilirubin: 2.2 mg/dL — ABNORMAL HIGH (ref 0.3–1.2)

## 2021-06-06 LAB — TROPONIN I (HIGH SENSITIVITY)
Troponin I (High Sensitivity): 30 ng/L — ABNORMAL HIGH (ref <18)
Troponin I (High Sensitivity): 24 ng/L — ABNORMAL HIGH (ref <18)

## 2021-06-06 LAB — BASIC METABOLIC PANEL
Anion gap: 10 (ref 5–15)
BUN: 13 mg/dL (ref 6–20)
CO2: 26 mmol/L (ref 22–32)
Calcium: 9 mg/dL (ref 8.9–10.3)
Chloride: 100 mmol/L (ref 98–111)
Creatinine, Ser: 1.34 mg/dL — ABNORMAL HIGH (ref 0.61–1.24)
GFR, Estimated: >60 mL/min (ref >60)
Glucose, Bld: 267 mg/dL — ABNORMAL HIGH (ref 70–99)
Potassium: 4.5 mmol/L (ref 3.5–5.1)
Sodium: 136 mmol/L (ref 135–145)

## 2021-06-06 LAB — LIPASE, BLOOD: Lipase: 60 U/L — ABNORMAL HIGH (ref 11–51)

## 2021-06-06 MED ORDER — MORPHINE SULFATE (PF) 4 MG/ML IV SOLN
4.0000 mg | Freq: Once | INTRAVENOUS | Status: AC
  Administered 2021-06-06: 4 mg via INTRAVENOUS
  Filled 2021-06-06: qty 1

## 2021-06-06 MED ORDER — ONDANSETRON 4 MG PO TBDP: 4.0000 mg | ORAL_TABLET | Freq: Three times a day (TID) | ORAL | 0 refills | Status: DC | PRN

## 2021-06-06 MED ORDER — DICYCLOMINE HCL 20 MG PO TABS
20.0000 mg | ORAL_TABLET | Freq: Three times a day (TID) | ORAL | 0 refills | Status: DC | PRN
Start: 2021-06-06 — End: 2022-03-14

## 2021-06-06 MED ORDER — ONDANSETRON HCL 4 MG/2ML IJ SOLN
4.0000 mg | Freq: Once | INTRAMUSCULAR | Status: AC
  Administered 2021-06-06: 4 mg via INTRAVENOUS
  Filled 2021-06-06: qty 2

## 2021-06-06 NOTE — ED Provider Notes (Signed)
[place] EMERGENCY DEPARTMENT Provider Note   CSN: [id number] Arrival date & time: 06/06/21  0025     History  Chief Complaint  Patient presents with   Abdominal Pain    Austin Shaw is a 56 y.o. male with a hx of CHF, HTN, hyperlipidemia, sleep apnea, and diabetes mellitus who presents to the ED with complaints of abdominal pain x 1 week. Patient reports pain is in the RUQ, initially intermittent, became constant tonight. Primarily RUQ radiating some into the general upper abdomen. Worse after eating. No alleviating factors. Associated nausea without vomiting. Denies fever, chills, diarrhea, melena, hematochezia, dysuria, hematuria, chest pain, or dyspnea. Denies prior abdominal surgery.   HPI     Home Medications Prior to Admission medications   Medication Sig Start Date End Date Taking? Authorizing Provider  amoxicillin-clavulanate (AUGMENTIN) 875-125 MG tablet Take 1 tablet by mouth 2 (two) times daily. 03/10/21   Truman Hayward, MD  carvedilol (COREG) 25 MG tablet Take 1 tablet (25 mg total) by mouth 2 (two) times daily with a meal. 04/14/21   Burnell Blanks, MD  clopidogrel (PLAVIX) 75 MG tablet Take 1 tablet (75 mg total) by mouth daily. 04/14/21   Burnell Blanks, MD  Continuous Blood Gluc Sensor (FREESTYLE LIBRE 14 DAY SENSOR) MISC Use to check blood sugars daily. Dx: E11.9 Patient not taking: Reported on 05/17/2021 04/15/21   Tonia Ghent, MD  Continuous Blood Gluc Sensor (FREESTYLE LIBRE 2 SENSOR) MISC Please specify directions, refills and quantity Patient not taking: Reported on 05/17/2021 04/15/21   Tonia Ghent, MD  dapagliflozin propanediol (FARXIGA) 10 MG TABS tablet Take 1 tablet (10 mg total) by mouth daily before breakfast. 04/14/21   Burnell Blanks, MD  Dulaglutide (TRULICITY) A999333 0000000 SOPN Inject 0.75 mg into the skin once a week. Every Friday in the evening 07/16/20   Tonia Ghent, MD  furosemide  (LASIX) 40 MG tablet Take 40 mg by mouth daily.    [provider]  glucose blood test strip CHECK BLOOD SUGAR TWICE DAILY 06/20/18   Tonia Ghent, MD  hydrALAZINE (APRESOLINE) 25 MG tablet TAKE 1 TABLET BY MOUTH THREE TIMES A DAY 12/27/20   Tonia Ghent, MD  metFORMIN (GLUCOPHAGE) 500 MG tablet TAKE 1 TABLET BY MOUTH TWICE A DAY THEN INCREASE TO 2 TABS IN MORNING AND 1 TAB EVENING 05/16/21   Tonia Ghent, MD  oxyCODONE-acetaminophen (PERCOCET) 5-325 MG tablet Take 1 tablet by mouth every 4 (four) hours as needed for severe pain. 11/19/20   Evelina Bucy, DPM  rosuvastatin (CRESTOR) 10 MG tablet Take 1 tablet (10 mg total) by mouth daily. 02/07/21   Burnell Blanks, MD  sacubitril-valsartan (ENTRESTO) 24-26 MG Take 1 tablet by mouth 2 (two) times daily. 03/23/21   Imogene Burn, PA-C  silver sulfADIAZINE (SILVADENE) 1 % cream Apply pea-sized amount to wound daily. 11/26/20   Evelina Bucy, DPM  terbinafine (LAMISIL) 250 MG tablet Take 1 tablet (250 mg total) by mouth daily. 05/16/21   Truman Hayward, MD  tobramycin (TOBREX) 0.3 % ophthalmic solution Place 1 drop into both eyes See admin instructions. Instill 1 drop both eyes 3 times daily the day before eye injection every 6-8 weeks    [provider]      Allergies    Lipitor [atorvastatin]    Review of Systems   Review of Systems  Constitutional:  Negative for chills and fever.  Respiratory:  Negative for shortness of breath.   Cardiovascular:  Negative for chest pain and leg swelling.  Gastrointestinal:  Positive for abdominal pain and nausea. Negative for anal bleeding, blood in stool, constipation, diarrhea and vomiting.  Genitourinary:  Negative for dysuria and hematuria.  Neurological:  Negative for syncope.  All other systems reviewed and are negative.  Physical Exam Updated Vital Signs BP (!) 187/94    Pulse 82    Temp 98.8 F (37.1 C) (Oral)    Resp (!) 22    SpO2 97%  Physical  Exam Vitals and nursing note reviewed.  Constitutional:      General: He is not in acute distress.    Appearance: He is well-developed. He is not toxic-appearing.  HENT:     Head: Normocephalic and atraumatic.  Eyes:     General:        Right eye: No discharge.        Left eye: No discharge.     Conjunctiva/sclera: Conjunctivae normal.  Cardiovascular:     Rate and Rhythm: Normal rate and regular rhythm.  Pulmonary:     Effort: Pulmonary effort is normal. No respiratory distress.     Breath sounds: Normal breath sounds. No wheezing, rhonchi or rales.  Abdominal:     General: There is no distension.     Palpations: Abdomen is soft.     Tenderness: There is abdominal tenderness (upper abdomen, most prominent in the RUQ). There is no guarding or rebound. Negative signs include Murphy's sign and McBurney's sign.  Musculoskeletal:     Cervical back: Neck supple.     Comments: Right foot transmetatarsal amputation noted to great toe.   Skin:    General: Skin is warm and dry.     Findings: No rash.  Neurological:     Mental Status: He is alert.     Comments: Clear speech.   Psychiatric:        Behavior: Behavior normal.    ED Results / Procedures / Treatments   Labs (all labs ordered are listed, but only abnormal results are displayed) Labs Reviewed  BASIC METABOLIC PANEL - Abnormal; Notable for the following components:      Result Value   Glucose, Bld 267 (*)    Creatinine, Ser 1.34 (*)    All other components within normal limits  LIPASE, BLOOD - Abnormal; Notable for the following components:   Lipase 60 (*)    All other components within normal limits  TROPONIN I (HIGH SENSITIVITY) - Abnormal; Notable for the following components:   Troponin I (High Sensitivity) 30 (*)    All other components within normal limits  CBC  HEPATIC FUNCTION PANEL  TROPONIN I (HIGH SENSITIVITY)    EKG EKG Interpretation  Date/Time:  Monday June 06 2021 02:10:33 EST Ventricular  Rate:  79 PR Interval:  155 QRS Duration: 96 QT Interval:  396 QTC Calculation: 454 R Axis:   22 Text Interpretation: Sinus rhythm Confirmed by Randal Buba, April ([id number]) on 06/06/2021 2:18:17 AM  Radiology DG Chest 2 View  Result Date: 06/06/2021 CLINICAL DATA:  Chest pain EXAM: CHEST - 2 VIEW COMPARISON:  05/13/2018 FINDINGS: Stable elevation of the right hemidiaphragm with associated right basilar atelectasis. Lungs are otherwise clear. No pneumothorax or pleural effusion. Cardiac size within normal limits. Pulmonary vascularity is normal. IMPRESSION: Stable elevation of the right hemidiaphragm. Electronically Signed   By: Fidela Salisbury M.D.   On: 06/06/2021 01:13   US Abdomen Limited RUQ (LIVER/GB)  Result Date: 06/06/2021 CLINICAL DATA:  Abdominal pain. EXAM: ULTRASOUND ABDOMEN LIMITED RIGHT UPPER QUADRANT COMPARISON:  None. FINDINGS: Evaluation is limited due to body habitus and overlying bowel gas. Gallbladder: There is gallstone. No gallbladder wall thickening or pericholecystic fluid. Negative sonographic Murphy's sign. Common bile duct: Diameter: 6 mm Liver: There is diffuse increased liver echogenicity most commonly seen in the setting of fatty infiltration. Superimposed inflammation or fibrosis is not excluded. Clinical correlation is recommended. The liver is enlarged measuring 22 cm in midclavicular length. Portal vein is patent on color Doppler imaging with normal direction of blood flow towards the liver. Other: None. IMPRESSION: 1. Enlarged fatty liver. Clinical correlation is recommended to evaluate for possibility of steatohepatitis. 2. Cholelithiasis without sonographic evidence of acute cholecystitis. Electronically Signed   By: Anner Crete M.D.   On: 06/06/2021 03:37    Procedures Procedures    Medications Ordered in ED Medications  morphine (PF) 4 MG/ML injection 4 mg (has no administration in time range)  ondansetron (ZOFRAN) injection 4 mg (has no administration in  time range)    ED Course/ Medical Decision Making/ A&P                           Medical Decision Making Amount and/or Complexity of Data Reviewed Labs: ordered. Radiology: ordered.  Risk Prescription drug management.   Patient presents to the ED with complaints of abdominal pain, this involves an extensive number of treatment options, and is a complaint that carries with it a high risk of complications and morbidity. Nontoxic, vitals w/ hypertension. Upper abdominal tenderness most prominent in the RUQ.    DDX including but not limited to: Cholelithiasis, choledocholithiasis, cholecystitis, cholangitis, pancreatitis, GERD, PUD.   Additional history obtained:  Chart review/nursing not review.   EKG: no STEMI. Sinus rhythm.   Lab Tests:  I reviewed & interpreted labs including:  CBC: unremarkable.  BMP: Hyperglycemia, mild creatinine elevation- similar to prior ranges.  Lipase: Mild elevation  Hepatic function panel: mild elevation in AST & T bili.  Troponin: 30--> 24.   Imaging Studies ordered:  I ordered and viewed the following imaging, agree with radiologist impression:  RUQ [place]:  CXR ordered in triage: Stable elevation of the right hemidiaphragm  ED Course:  I ordered medications including morphine for pain and zofran for nausea.   04:40: RE-EVAL: Patient feeling much better, resting comfortably, will PO challenge.   Patient does have mildly elevated troponin, however this is downtrending, his EKG is nonischemic, his pain does not seem cardiac in nature I have a low suspicion for ACS.  Right upper quadrant ultrasound with cholelithiasis which I suspect is the primary etiology of patient's pain, no findings of cholecystitis, bile duct dilation on ultrasound.  Given patient is feeling improved and tolerating p.o. he seems reasonable for discharge at this time.  We will have him follow-up very closely with general surgery with strict return precautions. Discussed w/  attending Dr. Randal Buba, in agreement, recommends discharge home with bentyl as needed for pain, will also give as needed zofran. I discussed results, treatment plan, need for follow-up, and return precautions with the patient. Provided opportunity for questions, patient confirmed understanding and is in agreement with plan.    Portions of this note were generated with [employer]. Dictation errors may occur despite best attempts at proofreading.   Final Clinical Impression(s) / ED Diagnoses Final diagnoses:  Abdominal pain  Gallstones  Hyperglycemia  Rx / DC Orders ED Discharge Orders          Ordered    dicyclomine (BENTYL) 20 MG tablet  Every 8 hours PRN        06/06/21 0501    ondansetron (ZOFRAN-ODT) 4 MG disintegrating tablet  Every 8 hours PRN        06/06/21 0501              Arnel Wymer, Glynda Jaeger, PA-C 06/06/21 0549    Palumbo, April, MD 06/06/21 0630

## 2021-06-06 NOTE — Discharge Instructions (Addendum)
You were seen in the emergency department today for abdominal pain.  Your work-up in the ER showed laboratory and abnormalities including high blood sugar and mild elevation in your creatinine which looks at kidney function as well as some of your liver function tests.  Please discuss these with your primary care provider and have them recheck.  Your troponin which is a heart enzyme was mildly elevated however this was downtrending and your EKG was reassuring.   Your ultrasound showed findings of fatty liver changes.  Your ultrasound also showed findings of gallstones which I suspect is what is causing your pain.  Please take Bentyl every 8 hours as needed for pain and Zofran every 8 hours as needed for nausea.   We have prescribed you new medication(s) today. Discuss the medications prescribed today with your pharmacist as they can have adverse effects and interactions with your other medicines including over the counter and prescribed medications. Seek medical evaluation if you start to experience new or abnormal symptoms after taking one of these medicines, seek care immediately if you start to experience difficulty breathing, feeling of your throat closing, facial swelling, or rash as these could be indications of a more serious allergic reaction  Please follow attached diet guidelines.     Please call the general surgery office to schedule an appointment for follow-up.  Return to the emergency department as needed for new or worsening symptoms including but not limited to new or worsening pain, fever, inability to keep fluids down, blood in vomit or stool, chest pain, trouble breathing, or any other concerns

## 2021-06-06 NOTE — ED Triage Notes (Signed)
Pt reported to ED with c/o right sided abdominal pain and tenderness on right side when he touches it. Endorses having gallbladder, denies any hx of gallstones. Pt states new medications have been making him sick, has hx of CHF, T2DM.

## 2021-06-06 NOTE — ED Notes (Signed)
Pt transported to Ultrasound.  

## 2021-06-08 ENCOUNTER — Ambulatory Visit (INDEPENDENT_AMBULATORY_CARE_PROVIDER_SITE_OTHER): Payer: BC Managed Care – PPO | Admitting: Podiatry

## 2021-06-08 DIAGNOSIS — Z91199 Patient's noncompliance with other medical treatment and regimen due to unspecified reason: Secondary | ICD-10-CM

## 2021-06-08 NOTE — Progress Notes (Signed)
No show

## 2021-06-10 ENCOUNTER — Telehealth: Payer: Self-pay | Admitting: Pharmacist

## 2021-06-10 DIAGNOSIS — I5023 Acute on chronic systolic (congestive) heart failure: Secondary | ICD-10-CM

## 2021-06-10 DIAGNOSIS — E7800 Pure hypercholesterolemia, unspecified: Secondary | ICD-10-CM

## 2021-06-10 NOTE — Telephone Encounter (Signed)
BMET stable after starting [place]. K previously 5 which limited Entresto dose increase or adding on spironolactone for his CHF. Now improved to 4.6, could consider either option if pt willing.  ?  ?Pt previously declined increasing rosuvastatin or adding on PCSK9i at last visit and preferred to focus on lifestyle change. LDL did improve from 116 to 99 but remains above goal < 55 and still requires med change.  ? ?Called pt to discuss, no answer on cell or home # and voicemails are full on both lines. ?

## 2021-06-14 NOTE — Telephone Encounter (Signed)
2nd attempt made to reach pt, still no answer and VM full. ?

## 2021-06-17 MED ORDER — ENTRESTO 49-51 MG PO TABS
1.0000 | ORAL_TABLET | Freq: Two times a day (BID) | ORAL | 5 refills | Status: DC
Start: 2021-06-17 — End: 2021-08-29

## 2021-06-17 MED ORDER — ROSUVASTATIN CALCIUM 20 MG PO TABS: 20.0000 mg | ORAL_TABLET | Freq: Every day | ORAL | 3 refills | Status: DC

## 2021-06-17 NOTE — Telephone Encounter (Signed)
3rd call made to pt. He does not wish to add on Repatha for his cholesterol and does not want to increase his rosuvastatin to 40mg  but he is agreeable to try higher 20mg  dose. New rx sent in, will recheck labs in 2 months same day that he sees Dr . Will add on direct LDL since appt is at 4:20pm so pt will not be fasting. ? ?BMET stable. Pt willing to increase Entresto to 49-51mg  BID. BP elevated at 153/84 at last visit with Dr 05/17/21. New rx sent in, will recheck BMET in 10 days. ?

## 2021-06-17 NOTE — Addendum Note (Signed)
Addended by: Dakhari Zuver E on: 06/17/2021 02:19 PM ? ? Modules accepted: Orders ? ?

## 2021-06-19 ENCOUNTER — Other Ambulatory Visit: Payer: Self-pay | Admitting: Family Medicine

## 2021-06-25 ENCOUNTER — Other Ambulatory Visit: Payer: Self-pay | Admitting: Family Medicine

## 2021-06-28 ENCOUNTER — Telehealth: Payer: Self-pay | Admitting: *Deleted

## 2021-06-28 NOTE — Telephone Encounter (Signed)
? ?  Pre-operative Risk Assessment  ?  ?Patient Name: Austin Shaw  ?DOB: 01-Mar-1966 ?MRN: [id number]  ? ?  ? ?Request for Surgical Clearance   ? ?Procedure:   LAPAROSCOPIC CHOLECYSTECTOMY  ? ?Date of Surgery:  Clearance 07/05/21                              ?   ?Surgeon:  DR. Fredricka Bonine ?Surgeon's Group or Practice Name:  [facility] ?Phone number:  [phone] ?Fax number:  [phone] ?  ?Type of Clearance Requested:   ?- Medical  ?- Pharmacy:  Hold Clopidogrel (Plavix)   ?  ?Type of Anesthesia:   CHOICE ?  ?Additional requests/questions:   ? ?Signed, ?Danielle Rankin   ?06/28/2021, 12:33 PM  ? ?

## 2021-06-28 NOTE — Telephone Encounter (Signed)
Dr. Allyson Sabal ?We are asked for plavix hold and clearance for lap chole in this patient with PAD. You saw him on 05/17/21.  ? ?Can you please make recommendations for plavix? ?

## 2021-06-29 ENCOUNTER — Other Ambulatory Visit: Payer: BC Managed Care – PPO

## 2021-07-01 ENCOUNTER — Other Ambulatory Visit: Payer: BC Managed Care – PPO

## 2021-07-04 ENCOUNTER — Encounter: Payer: Self-pay | Admitting: Physician Assistant

## 2021-07-04 ENCOUNTER — Ambulatory Visit (INDEPENDENT_AMBULATORY_CARE_PROVIDER_SITE_OTHER): Payer: BC Managed Care – PPO | Admitting: Physician Assistant

## 2021-07-04 ENCOUNTER — Telehealth: Payer: Self-pay | Admitting: *Deleted

## 2021-07-04 DIAGNOSIS — I5042 Chronic combined systolic (congestive) and diastolic (congestive) heart failure: Secondary | ICD-10-CM

## 2021-07-04 DIAGNOSIS — Z01810 Encounter for preprocedural cardiovascular examination: Secondary | ICD-10-CM | POA: Diagnosis not present

## 2021-07-04 NOTE — Telephone Encounter (Signed)
? ? ?  Name: Austin Shaw  ?DOB: Oct 13, 1965  ?MRN: [id number] ? ?Primary Cardiologist: Lauree Chandler, MD ? ? ?Preoperative team, please contact this patient and set up a phone call appointment ASAP for further preoperative risk assessment. Please obtain consent and complete medication review. Thank you for your help. ? ? ?Charlie Pitter, PA-C ?07/04/2021, 8:34 AM ?[phone] ?[place] ?[address] ?[place], [place] [address] ? ? ?

## 2021-07-04 NOTE — Telephone Encounter (Signed)
I was able to reach the pt's wife on her cell # [phone]. Both numbers on file for the pt, vm was full could not leave a message. Pt's wife went over the medications with me in prep for todays tele pre op appt @ 2 pm. Med rec and consent are done.  ? ?  ?Patient Consent for Virtual Visit  ? ? ?   ? ?Austin Shaw has provided verbal consent on 07/04/2021 for a virtual visit (video or telephone). ? ? ?CONSENT FOR VIRTUAL VISIT FOR:  Austin Shaw  ?By participating in this virtual visit I agree to the following: ? ?I hereby voluntarily request, consent and authorize [facility] and its employed or contracted physicians, physician assistants, nurse practitioners or other licensed health care professionals (the Practitioner), to provide me with telemedicine health care services (the ?Services") as deemed necessary by the treating Practitioner. I acknowledge and consent to receive the Services by the Practitioner via telemedicine. I understand that the telemedicine visit will involve communicating with the Practitioner through live audiovisual communication technology and the disclosure of certain medical information by electronic transmission. I acknowledge that I have been given the opportunity to request an in-person assessment or other available alternative prior to the telemedicine visit and am voluntarily participating in the telemedicine visit. ? ?I understand that I have the right to withhold or withdraw my consent to the use of telemedicine in the course of my care at any time, without affecting my right to future care or treatment, and that the Practitioner or I may terminate the telemedicine visit at any time. I understand that I have the right to inspect all information obtained and/or recorded in the course of the telemedicine visit and may receive copies of available information for a reasonable fee.  I understand that some of the potential risks of receiving the Services via telemedicine  include:  ?Delay or interruption in medical evaluation due to technological equipment failure or disruption; ?Information transmitted may not be sufficient (e.g. poor resolution of images) to allow for appropriate medical decision making by the Practitioner; and/or  ?In rare instances, security protocols could fail, causing a breach of personal health information. ? ?Furthermore, I acknowledge that it is my responsibility to provide information about my medical history, conditions and care that is complete and accurate to the best of my ability. I acknowledge that Practitioner's advice, recommendations, and/or decision may be based on factors not within their control, such as incomplete or inaccurate data provided by me or distortions of diagnostic images or specimens that may result from electronic transmissions. I understand that the practice of medicine is not an exact science and that Practitioner makes no warranties or guarantees regarding treatment outcomes. I acknowledge that a copy of this consent can be made available to me via my patient portal [facility] MyChart), or I can request a printed copy by calling the office of [facility].   ? ?I understand that my insurance will be billed for this visit.  ? ?I have read or had this consent read to me. ?I understand the contents of this consent, which adequately explains the benefits and risks of the Services being provided via telemedicine.  ?I have been provided ample opportunity to ask questions regarding this consent and the Services and have had my questions answered to my satisfaction. ?I give my informed consent for the services to be provided through the use of telemedicine in my medical care ? ? ? ?

## 2021-07-04 NOTE — Telephone Encounter (Signed)
Since surgical clearance is time sensitive, please confirmed surgeon's office received clearance note I faxed. He is cleared for surgery per my discussion with Dr. Clifton James and is already holding Plavix - the only potential barrier we identified was the potential for uncontrolled BP (patient doesn't have a way to check his BP) so please direct their attention to my note. Please call if any questions or concerns. ?

## 2021-07-04 NOTE — Progress Notes (Addendum)
? ?Virtual Visit via Telephone Note  ? ?This visit type was conducted due to national recommendations for restrictions regarding the COVID-19 Pandemic (e.g. social distancing) in an effort to limit this patient's exposure and mitigate transmission in our community.  Due to his co-morbid illnesses, this patient is at least at moderate risk for complications without adequate follow up.  This format is felt to be most appropriate for this patient at this time.  The patient did not have access to video technology/had technical difficulties with video requiring transitioning to audio format only (telephone).  All issues noted in this document were discussed and addressed.  No physical exam could be performed with this format.  Please refer to the patient's chart for his  consent to telehealth for [facility]. ?Evaluation Performed:  Preoperative cardiovascular risk assessment ? ?This visit type was conducted due to national recommendations for restrictions regarding the COVID-19 Pandemic (e.g. social distancing).  This format is felt to be most appropriate for this patient at this time.  All issues noted in this document were discussed and addressed.  No physical exam was performed (except for noted visual exam findings with Video Visits).  Please refer to the patient's chart (MyChart message for video visits and phone note for telephone visits) for the patient's consent to telehealth for [facility]. ?_____________  ? ?Date:  07/04/2021  ? ?Patient ID:  Austin Shaw, Austin Shaw [birth year], MRN [id number] ?Patient Location:  ?Work ?Provider location:   ?Office ? ?Primary Care Provider:  Joaquim Nam, MD ?Primary Cardiologist:  Verne Carrow, MD ? ?Chief Complaint  ?  ?56 y.o. 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  ? ?Past Medical History  ?  ?Past Medical History:  ?Diagnosis Date  ? Acute osteomyelitis of toe (HCC) 08/26/2020  ? Allergy   ? CHF (congestive heart failure) (HCC)   ? Diabetes mellitus   ? type 2  ? E coli infection 09/20/2020  ? Group B streptococcal infection 09/20/2020  ? Hyperlipidemia   ? Hypertension   ? Hypertension   ? Morbid obesity (HCC)   ? BMI 50  ? Peripheral vascular disease (HCC)   ? Sleep apnea   ? pt states he no longer has sleep apnea since tonsillectomy  ? ?Past Surgical History:  ?Procedure Laterality Date  ? ABDOMINAL AORTOGRAM W/LOWER EXTREMITY N/A 07/14/2020  ? Procedure: ABDOMINAL AORTOGRAM W/LOWER EXTREMITY;  Surgeon: Iran Ouch, MD;  [facility];  Service: Cardiovascular;  Laterality: N/A;  ? AMPUTATION Right 07/21/2020  ? Procedure: AMPUTATION RIGHT GREAT TOE;  Surgeon: Park Liter, DPM;  [facility]: [facility];  Service: Podiatry;  Laterality: Right;  ? GRAFT APPLICATION Right 11/19/2020  ? Procedure: GRAFT APPLICATION;  Surgeon: Park Liter, DPM;  [facility];  Service: Podiatry;  Laterality: Right;  ? IRRIGATION AND DEBRIDEMENT FOOT Right 07/21/2020  ? Procedure: IRRIGATION AND DEBRIDEMENT RIGHT GREAT TOE;  Surgeon: Park Liter, DPM;  [facility];  Service: Podiatry;  Laterality: Right;  ? METATARSAL HEAD EXCISION Right 07/23/2020  ? Procedure: METATARSAL HEAD EXCISION;  Surgeon: Park Liter, DPM;  [facility];  Service: Podiatry;  Laterality: Right;  ? TONSILLECTOMY    ? WOUND DEBRIDEMENT Right 07/23/2020  ? Procedure: DEBRIDEMENT  WOUND WITH DELAYED WOUND CLOSURE;  Surgeon: Park Liter, DPM;  [facility];  Service: Podiatry;  Laterality: Right;  ? WOUND DEBRIDEMENT Right 09/17/2020  ? Procedure: DEBRIDEMENT OF RIGHT FOOT WOUND, PARTIAL WOUND CLOSURE, APPLICATION OF SKIN GRAFT SUBSTITUTE;  Surgeon: Park Liter, DPM;   [facility];  Service: Podiatry;  Laterality: Right;  ? WOUND DEBRIDEMENT Right 10/15/2020  ? Procedure: DEBRIDEMENT WOUND with Application of skin graft substitute;  Surgeon: Park Liter, DPM;  [facility];  Service: Podiatry;  Laterality: Right;  ? WOUND DEBRIDEMENT Right 11/19/2020  ? Procedure: DEBRIDEMENT WOUND;  Surgeon: Park Liter, DPM;  [facility];  Service: Podiatry;  Laterality: Right;  ? ? ?Allergies ? ?Allergies  ?Allergen Reactions  ? Lipitor [Atorvastatin] Other (See Comments)  ?  Lightheaded & dizzy  ? ? ?History of Present Illness  ?  ?Austin Shaw is a 56 y.o. male who presents via Web designer for a telehealth visit today. I am coming on board for preop team today and recommended prioritizing his visit due to surgery date of tomorrow. Spoke with patient and his wife. Pt was last seen in cardiology clinic on 05/17/21 by Dr. Allyson Sabal for PV follow-up and 03/23/21 by Jacolyn Reedy PA-C for cardiology follow-up. Per Michele's guidance, he has also followed with pharmD team in the interim for BP/CHF medication titration. He has history of cardiomyopathy with EF 20-25% in 2016, treated medically, no prior ischemic assessment documented, presumed NICM per notes. Last echo 02/2021 showed EF 45-50% with global hypokinesis. Since his last visit, Austin Shaw reports he has been doing well without any chest pain, dyspnea, edema, palpitations or syncope. He reports he did not increase his Entresto yet to 49/51mg  BID as he wanted to wait until after surgery. He does not have a way to check his vital signs today but his wife states they checked his VS at his pre admissions testing visit in [place] and they did not report any concerns. He has already begun holding Plavix as per his pre-procedure instructions in preparation for surgery tomorrow. He does not have a way to check his blood pressure. Last BP we have on file was 187/94 in ED 06/06/21 but wife states this is because  he had missed some of his medications such as hydralazine due to being sick. Otherwise last recent office BPs have run 130s-150s/80. Higher values previously tended to occur in the setting of missing medication doses though patient reports adherence today.  ? ?(After completion of the initial phone call I could not reach the patient to verify the changes listed on his medicine list so called wife back per DPR. The doses of carvedilol and Jardiance were reported taking differently than prescribed. Per his wife's clarification, he also had not been taking his medications as previously prescribed as he did not like the way he felt on the higher doses while at work - he self-decreased carvedilol back down to 12.5mg  BID instead of 25mg  BID and Jardiance 5mg  instead of 10mg . This change was made several weeks ago. As above, the patient reported he has been feeling well the last few weeks from a cardiac standpoint.) ? ? ?Home Medications  ?  ?Prior to Admission medications   ?Medication Sig Start Date End Date Taking? Authorizing Provider  ?amoxicillin-clavulanate (AUGMENTIN) 875-125 MG tablet Take 1 tablet by mouth 2 (two) times daily. 03/10/21   , MD  ?carvedilol (COREG) 25 MG tablet Take 1 tablet (25  mg total) by mouth 2 (two) times daily with a meal. ?Patient taking differently: Take 12.5 mg by mouth 2 (two) times daily with a meal. 04/14/21   Kathleene Hazel, MD  ?clopidogrel (PLAVIX) 75 MG tablet Take 1 tablet (75 mg total) by mouth daily. 04/14/21   Kathleene Hazel, MD  ?Continuous Blood Gluc Sensor (FREESTYLE LIBRE 14 DAY SENSOR) MISC Use to check blood sugars daily. Dx: E11.9 04/15/21   Joaquim Nam, MD  ?Continuous Blood Gluc Sensor (FREESTYLE LIBRE 2 SENSOR) MISC Please specify directions, refills and quantity 04/15/21   Joaquim Nam, MD  ?dapagliflozin propanediol (FARXIGA) 10 MG TABS tablet Take 1 tablet (10 mg total) by mouth daily before breakfast. ?Patient taking  differently: Take 5 mg by mouth daily before breakfast. 04/14/21   Kathleene Hazel, MD  ?dicyclomine (BENTYL) 20 MG tablet Take 1 tablet (20 mg total) by mouth every 8 (eight) hours as needed for spasms (pain).

## 2021-07-04 NOTE — Telephone Encounter (Signed)
I was able to reach the pt's wife on her cell # [phone]. Both numbers on file for the pt, vm was full could not leave a message. Pt's wife went over the medications with me in prep for todays tele pre op appt @ 2 pm. Med rec and consent are done.  ? ?

## 2021-07-04 NOTE — Telephone Encounter (Signed)
Informed requesting office that we have cleared the pt and have sent over the clearance notes.  ?

## 2021-07-05 ENCOUNTER — Telehealth: Payer: Self-pay | Admitting: Pharmacist

## 2021-07-05 NOTE — Telephone Encounter (Addendum)
Pt canceled BMET on 3/22 and moved lab to 3/24, then did not show. Called pt, wife states he didn't increase his Entresto dose. States he's currently taking 1 pill once daily and also cut back on his carvedilol and Farxiga doses. None of these med changes were relayed to team that pt saw yesterday in office. Pt in surgery getting gallbladder removed today. Advised her pt needs in person appt for complete med review as he has adjusted doses on all of his CHF meds without advisement from provider. Will call him at the end of the week a few days after his surgery to schedule this. ?

## 2021-07-07 NOTE — Telephone Encounter (Signed)
Called pt, scheduled PharmD follow up on 4/12. ?

## 2021-07-11 ENCOUNTER — Encounter: Payer: Self-pay | Admitting: Infectious Disease

## 2021-07-11 ENCOUNTER — Ambulatory Visit: Payer: BC Managed Care – PPO | Admitting: Infectious Disease

## 2021-07-11 ENCOUNTER — Other Ambulatory Visit: Payer: Self-pay

## 2021-07-11 VITALS — BP 182/108 | HR 86 | Temp 98.5°F | Resp 16 | Ht 69.0 in | Wt 335.0 lb

## 2021-07-11 DIAGNOSIS — I429 Cardiomyopathy, unspecified: Secondary | ICD-10-CM

## 2021-07-11 DIAGNOSIS — A498 Other bacterial infections of unspecified site: Secondary | ICD-10-CM | POA: Diagnosis not present

## 2021-07-11 DIAGNOSIS — I10 Essential (primary) hypertension: Secondary | ICD-10-CM

## 2021-07-11 DIAGNOSIS — I739 Peripheral vascular disease, unspecified: Secondary | ICD-10-CM

## 2021-07-11 DIAGNOSIS — M86179 Other acute osteomyelitis, unspecified ankle and foot: Secondary | ICD-10-CM | POA: Diagnosis not present

## 2021-07-11 DIAGNOSIS — A491 Streptococcal infection, unspecified site: Secondary | ICD-10-CM

## 2021-07-11 NOTE — Progress Notes (Signed)
? ?Subjective:  ? ? ?Chief complaint: follow-up for diabetic foot infection and osteomyelitis ? ? ? ? Patient ID: Austin Shaw, male    DOB: 1965-07-13, 56 y.o.   MRN: [id number] ? ?HPI ? ?56 y.o. male with these mellitus peripheral vascular disease status post revascularization with gangrenous toe with osteomyelitis status post I&D of bursal collection and amputation of first toe then repeat trip to the OR for debridement and resection with cultures from both surgeries yielding group B streptococcus and E. coli that was fairly sensitive.  We  placed him on IV cefazolin along with oral metronidazole. ?  ?He had been taking both medications religiously including the metronidazole which she tries to take as quickly as possible to avoid the unpleasant taste gives and ones mouth. ?  ?I saw him last on May 19 that was encouraged by the appearance of his wound but discouraged that his inflammatory markers had increased. ?  ?We switched him from IV ceftriaxone and oral metronidazole to Augmentin. ? ?Has been taking that since then. ? ?Unfortunately his wound required further debridement and he underwent debridement irrigation of his right foot wound on the third June by Dr. Samuella Cota but with placement of skin graft over the wound apparently there have been concerned that there will be need for bone biopsy but this was not performed. ? ?Then he had apparent failure of the graft and need for further debridement and placement of a new skin graft. ? ?The last saw him he has had several debridements but his wound is healing up quite nicely with just 1 residual open area. ? ? ?His last visit I was going to stop his antibiotics was inflammatory markers in particular his sed rate had markedly elevated. ? ?He is continued on Augmentin since then and been following with Dr. Samuella Cota.  She had some debridement of callus when seen by Dr. Samuella Cota in 29 April 2021. ? ?I saw him in late January and had intended to stop antibiotics his  inflammatory markers were trending in the right direction but he is continued on them.  Wound continues to look quite stable.  He is occasional pain off and on but overall trajectory is encouraging. ? ? ? ? ? ? ? ? ?Past Medical History:  ?Diagnosis Date  ? Acute osteomyelitis of toe (HCC) 08/26/2020  ? Allergy   ? CHF (congestive heart failure) (HCC)   ? Diabetes mellitus   ? type 2  ? E coli infection 09/20/2020  ? Group B streptococcal infection 09/20/2020  ? Hyperlipidemia   ? Hypertension   ? Hypertension   ? Morbid obesity (HCC)   ? BMI 50  ? Peripheral vascular disease (HCC)   ? Sleep apnea   ? pt states he no longer has sleep apnea since tonsillectomy  ? ? ?Past Surgical History:  ?Procedure Laterality Date  ? ABDOMINAL AORTOGRAM W/LOWER EXTREMITY N/A 07/14/2020  ? Procedure: ABDOMINAL AORTOGRAM W/LOWER EXTREMITY;  Surgeon: Iran Ouch, MD;  [facility];  Service: Cardiovascular;  Laterality: N/A;  ? AMPUTATION Right 07/21/2020  ? Procedure: AMPUTATION RIGHT GREAT TOE;  Surgeon: Park Liter, DPM;  [facility];  Service: Podiatry;  Laterality: Right;  ? GRAFT APPLICATION Right 11/19/2020  ? Procedure: GRAFT APPLICATION;  Surgeon: Park Liter, DPM;  [facility];  Service: Podiatry;  Laterality: Right;  ? IRRIGATION AND DEBRIDEMENT FOOT Right 07/21/2020  ? Procedure: IRRIGATION AND DEBRIDEMENT RIGHT GREAT TOE;  Surgeon: Park Liter,  DPM;  [facility];  Service: Podiatry;  Laterality: Right;  ? METATARSAL HEAD EXCISION Right 07/23/2020  ? Procedure: METATARSAL HEAD EXCISION;  Surgeon: Park Liter, DPM;  [facility];  Service: Podiatry;  Laterality: Right;  ? TONSILLECTOMY    ? WOUND DEBRIDEMENT Right 07/23/2020  ? Procedure: DEBRIDEMENT WOUND WITH DELAYED WOUND CLOSURE;  Surgeon: Park Liter, DPM;  [facility];  Service: Podiatry;  Laterality: Right;  ? WOUND DEBRIDEMENT Right 09/17/2020  ? Procedure: DEBRIDEMENT OF RIGHT FOOT WOUND, PARTIAL WOUND  CLOSURE, APPLICATION OF SKIN GRAFT SUBSTITUTE;  Surgeon: Park Liter, DPM;  [facility];  Service: Podiatry;  Laterality: Right;  ? WOUND DEBRIDEMENT Right 10/15/2020  ? Procedure: DEBRIDEMENT WOUND with Application of skin graft substitute;  Surgeon: Park Liter, DPM;  [facility];  Service: Podiatry;  Laterality: Right;  ? WOUND DEBRIDEMENT Right 11/19/2020  ? Procedure: DEBRIDEMENT WOUND;  Surgeon: Park Liter, DPM;  [facility];  Service: Podiatry;  Laterality: Right;  ? ? ?Family History  ?Problem Relation Age of Onset  ? Diabetes Father   ? Diabetes Other   ?     fam hx 1st degree relative  ? Congestive Heart Failure Other   ? Lupus Daughter   ? Sjogren's syndrome Daughter   ? Colon cancer Neg Hx   ? Prostate cancer Neg Hx   ? Esophageal cancer Neg Hx   ? Rectal cancer Neg Hx   ? ? ?  ?Social History  ? ?Socioeconomic History  ? Marital status: Married  ?  Spouse name: Not on file  ? Number of children: Not on file  ? Years of education: Not on file  ? Highest education level: Not on file  ?Occupational History  ? Occupation: Scientist, water quality  ?  Employer: [employer]  ?Tobacco Use  ? Smoking status: Never  ? Smokeless tobacco: Never  ?Vaping Use  ? Vaping Use: Never used  ?Substance and Sexual Activity  ? Alcohol use: No  ?  Alcohol/week: 0.0 standard drinks  ? Drug use: No  ? Sexual activity: Not on file  ?Other Topics Concern  ? Not on file  ?Social History Narrative  ? Working at [employer]   ? 4 kids  ? Married, 1998  ? From [facility]  ? ?Social Determinants of Health  ? ?Financial Resource Strain: Not on file  ?Food Insecurity: Not on file  ?Transportation Needs: Not on file  ?Physical Activity: Not on file  ?Stress: Not on file  ?Social Connections: Not on file  ? ? ?Allergies  ?Allergen Reactions  ? Lipitor [Atorvastatin] Other (See Comments)  ?  Lightheaded & dizzy  ? ? ? ?Current Outpatient Medications:  ?  amoxicillin-clavulanate (AUGMENTIN) 875-125 MG tablet, Take  1 tablet by mouth 2 (two) times daily., Disp: 60 tablet, Rfl: 3 ?  carvedilol (COREG) 25 MG tablet, Take 1 tablet (25 mg total) by mouth 2 (two) times daily with a meal. (Patient taking differently: Take 12.5 mg by mouth 2 (two) times daily with a meal.), Disp: 180 tablet, Rfl: 3 ?  clopidogrel (PLAVIX) 75 MG tablet, Take 1 tablet (75 mg total) by mouth daily., Disp: 90 tablet, Rfl: 3 ?  Continuous Blood Gluc Sensor (FREESTYLE LIBRE 14 DAY SENSOR) MISC, Use to check blood sugars daily. Dx: E11.9, Disp: 1 each, Rfl: 12 ?  Continuous Blood Gluc Sensor (FREESTYLE LIBRE 2 SENSOR) MISC, Please specify directions, refills and quantity, Disp: 2 each,  Rfl: 2 ?  dapagliflozin propanediol (FARXIGA) 10 MG TABS tablet, Take 1 tablet (10 mg total) by mouth daily before breakfast. (Patient taking differently: Take 5 mg by mouth daily before breakfast.), Disp: 90 tablet, Rfl: 3 ?  dicyclomine (BENTYL) 20 MG tablet, Take 1 tablet (20 mg total) by mouth every 8 (eight) hours as needed for spasms (pain). (Patient not taking: Reported on 07/04/2021), Disp: 15 tablet, Rfl: 0 ?  furosemide (LASIX) 40 MG tablet, Take 40 mg by mouth daily., Disp: , Rfl:  ?  glucose blood test strip, CHECK BLOOD SUGAR TWICE DAILY, Disp: 100 each, Rfl: 3 ?  hydrALAZINE (APRESOLINE) 25 MG tablet, TAKE 1 TABLET BY MOUTH THREE TIMES A DAY, Disp: 270 tablet, Rfl: 1 ?  metFORMIN (GLUCOPHAGE) 500 MG tablet, TAKE 1 TABLET BY MOUTH TWICE A DAY THEN INCREASE TO 2 TABS IN MORNING AND 1 TAB EVENING (Patient taking differently: Take 500 mg by mouth 2 (two) times daily with a meal. Takes 2 tabs in the AM and 1 tab I  the PM), Disp: 270 tablet, Rfl: 3 ?  ondansetron (ZOFRAN-ODT) 4 MG disintegrating tablet, Take 1 tablet (4 mg total) by mouth every 8 (eight) hours as needed for nausea or vomiting. (Patient not taking: Reported on 07/04/2021), Disp: 5 tablet, Rfl: 0 ?  oxyCODONE-acetaminophen (PERCOCET) 5-325 MG tablet, Take 1 tablet by mouth every 4 (four) hours as needed  for severe pain., Disp: 20 tablet, Rfl: 0 ?  rosuvastatin (CRESTOR) 20 MG tablet, Take 1 tablet (20 mg total) by mouth daily., Disp: 90 tablet, Rfl: 3 ?  sacubitril-valsartan (ENTRESTO) 49-51 MG, Take 1 table

## 2021-07-12 ENCOUNTER — Other Ambulatory Visit: Payer: Self-pay | Admitting: Infectious Disease

## 2021-07-12 DIAGNOSIS — M869 Osteomyelitis, unspecified: Secondary | ICD-10-CM

## 2021-07-12 LAB — BASIC METABOLIC PANEL WITH GFR
BUN/Creatinine Ratio: 10 (calc) (ref 6–22)
BUN: 14 mg/dL (ref 7–25)
CO2: 27 mmol/L (ref 20–32)
Calcium: 9.2 mg/dL (ref 8.6–10.3)
Chloride: 101 mmol/L (ref 98–110)
Creat: 1.47 mg/dL — ABNORMAL HIGH (ref 0.70–1.30)
Glucose, Bld: 214 mg/dL — ABNORMAL HIGH (ref 65–99)
Potassium: 4.3 mmol/L (ref 3.5–5.3)
Sodium: 139 mmol/L (ref 135–146)
eGFR: 56 mL/min/{1.73_m2} — ABNORMAL LOW (ref >=60)

## 2021-07-12 LAB — CBC WITH DIFFERENTIAL/PLATELET
Absolute Monocytes: 605 cells/uL (ref 200–950)
Basophils Absolute: 62 cells/uL (ref 0–200)
Basophils Relative: 0.7 %
Eosinophils Absolute: 214 cells/uL (ref 15–500)
Eosinophils Relative: 2.4 %
HCT: 41.2 % (ref 38.5–50.0)
Hemoglobin: 13.2 g/dL (ref 13.2–17.1)
Lymphs Abs: 2287 cells/uL (ref 850–3900)
MCH: 28.9 pg (ref 27.0–33.0)
MCHC: 32 g/dL (ref 32.0–36.0)
MCV: 90.2 fL (ref 80.0–100.0)
MPV: 11.6 fL (ref 7.5–12.5)
Monocytes Relative: 6.8 %
Neutro Abs: 5732 cells/uL (ref 1500–7800)
Neutrophils Relative %: 64.4 %
Platelets: 266 10*3/uL (ref 140–400)
RBC: 4.57 10*6/uL (ref 4.20–5.80)
RDW: 12.9 % (ref 11.0–15.0)
Total Lymphocyte: 25.7 %
WBC: 8.9 10*3/uL (ref 3.8–10.8)

## 2021-07-12 LAB — SEDIMENTATION RATE: Sed Rate: 84 mm/h — ABNORMAL HIGH (ref 0–20)

## 2021-07-12 LAB — C-REACTIVE PROTEIN: CRP: 18.9 mg/L — ABNORMAL HIGH (ref <8.0)

## 2021-07-19 NOTE — Progress Notes (Unsigned)
Patient ID: Austin Shaw                 DOB: 01-07-1966                      MRN: [id number] ? ? ? ? ?HPI: ?Austin Shaw is a 56 y.o. male patient referred by Austin Shaw and Austin Barrios, PA to pharmacy clinic for HF and hyperlipidemia medication management. 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 Most recent LVEF 45-50% on 03/01/21, previously 20-25% in 08/2014 and 50-55% in 11/2014. ? ?Patient saw Austin. Angelena Shaw 02/07/21 to reestablish with cardiology. Has had multiple visits since then and was started on GDMT for HF. Initially seen by Austin Shaw on 04/14/21. Was started on Farxiga 10 mg daily, increased carvedilol to 25 mg BID, and continued on Entresto 24-26 mg BID. Entresto was increased to 49-51 mg BID after stable BMET on 06/17/21, but pt missed f/u BMET appt. Per phone note with Austin Shaw on 3/28, pt reported he did not increase Entresto dose (was only taking 1 pill once daily), decreased carvedilol to 12.5 mg BID and Farxiga to 5 mg without advisement from provider. BMET on 07/11/20 showed Cr 1.47 and K 4.3. Was scheduled for 4/12 appointment to have a complete med review.  ? ?Pt's LDLs were still elevated at 116 on rosuvastatin 10 mg at 04/14/21 appointment, but did not want to increase med and preferred to focus on lifestyle improvements. LDL on rechecked labs on 06/10/21 still elevated at 99. Austin Shaw called and pt did not wish to add on Repatha but was agreeable to increasing rosuvastatin to 20 mg daily. ? ?Pt presents today for HF f/u and med review ? ??meds/doses they are taking? ??sxs - dizziness, lightheaded, fatigued, chest pain, palpitations, SOB, LEE, PND, orthopnea ??low salt diet? ??weights at home? BP at home? ?Plan = trying to get him back on GDMT he has been started on, f/u in 1 month?  ??lipids today  ? ?Today he returns to pharmacy clinic for further medication  titration. At last visit with MD ***. Symptomatically, she is feeling ***, *** dizziness, lightheadedness, and fatigue. *** chest pain or palpitations. Feels SOB when ***. Able to complete all ADLs. Activity level ***. She *** checks her weight at home (normal range *** - *** lbs). *** LEE, PND, or orthopnea. Appetite has been ***. She *** adheres to a low-salt diet. ? ?Current CHF meds: Entresto 49-51 mg twice daily, carvedilol 25 mg twice daily, Farxiga 10 mg daily, furosemide 40 mg daily ?Previously tried: losartan 100 mg daily ?Current lipid meds: rosuvastatin 20 mg daily ?Previously tried: atorvastatin 20 mg and 40 mg daily, simvastatin 20 mg and 40 mg daily ? ?BP goal: <130/80 ?LDL goal: <55 ?Risk factors: premature ASCVD, DM, HTN, HF, obesity ? ?Family History: The patient's  family history includes Congestive Heart Failure in an other family member; Diabetes in his father and another family member; Lupus in his daughter; Sjogren's syndrome in his daughter.  ? ?Social History: no smoking, no alcohol consumption ? ?Diet:  ? ?Exercise: Work 45 hours/week, limited time to exercise ? ?Home BP readings:  ? ?Wt Readings from Last 3 Encounters:  ?07/11/21 (!) 335 lb (152 kg)  ?05/17/21 (!) 347 lb 6.4 oz (157.6 kg)  ?05/09/21 (!) 351 lb 9.6 oz (159.5 kg)  ? ?BP Readings from  Last 3 Encounters:  ?07/11/21 (!) 182/108  ?06/06/21 (!) 154/79  ?05/17/21 (!) 153/84  ? ?Pulse Readings from Last 3 Encounters:  ?07/11/21 86  ?06/06/21 84  ?05/17/21 74  ? ? ?Renal function: ?Estimated Creatinine Clearance: 83.9 mL/min (A) (by C-G formula based on SCr of 1.47 mg/dL (H)). ? ?Past Medical History:  ?Diagnosis Date  ? Acute osteomyelitis of toe ([facility]) 08/26/2020  ? Allergy   ? CHF (congestive heart failure) ([facility])   ? Diabetes mellitus   ? type 2  ? E coli infection 09/20/2020  ? Group B streptococcal infection 09/20/2020  ? Hyperlipidemia   ? Hypertension   ? Hypertension   ? Morbid obesity ([facility])   ? BMI 50  ? Peripheral vascular  disease ([facility])   ? Sleep apnea   ? pt states he no longer has sleep apnea since tonsillectomy  ? ? ?Current Outpatient Medications on File Prior to Visit  ?Medication Sig Dispense Refill  ? carvedilol (COREG) 25 MG tablet Take 1 tablet (25 mg total) by mouth 2 (two) times daily with a meal. (Patient taking differently: Take 12.5 mg by mouth 2 (two) times daily with a meal.) 180 tablet 3  ? clopidogrel (PLAVIX) 75 MG tablet Take 1 tablet (75 mg total) by mouth daily. 90 tablet 3  ? Continuous Blood Gluc Sensor (FREESTYLE LIBRE 14 DAY SENSOR) MISC Use to check blood sugars daily. Dx: E11.9 1 each 12  ? Continuous Blood Gluc Sensor (FREESTYLE LIBRE 2 SENSOR) MISC Please specify directions, refills and quantity 2 each 2  ? dapagliflozin propanediol (FARXIGA) 10 MG TABS tablet Take 1 tablet (10 mg total) by mouth daily before breakfast. (Patient taking differently: Take 5 mg by mouth daily before breakfast.) 90 tablet 3  ? dicyclomine (BENTYL) 20 MG tablet Take 1 tablet (20 mg total) by mouth every 8 (eight) hours as needed for spasms (pain). (Patient not taking: Reported on 07/11/2021) 15 tablet 0  ? furosemide (LASIX) 40 MG tablet Take 40 mg by mouth daily.    ? glucose blood test strip CHECK BLOOD SUGAR TWICE DAILY 100 each 3  ? hydrALAZINE (APRESOLINE) 25 MG tablet TAKE 1 TABLET BY MOUTH THREE TIMES A DAY 270 tablet 1  ? metFORMIN (GLUCOPHAGE) 500 MG tablet TAKE 1 TABLET BY MOUTH TWICE A DAY THEN INCREASE TO 2 TABS IN MORNING AND 1 TAB EVENING (Patient taking differently: Take 500 mg by mouth 2 (two) times daily with a meal. Takes 2 tabs in the AM and 1 tab I  the PM) 270 tablet 3  ? ondansetron (ZOFRAN-ODT) 4 MG disintegrating tablet Take 1 tablet (4 mg total) by mouth every 8 (eight) hours as needed for nausea or vomiting. 5 tablet 0  ? oxyCODONE-acetaminophen (PERCOCET) 5-325 MG tablet Take 1 tablet by mouth every 4 (four) hours as needed for severe pain. 20 tablet 0  ? rosuvastatin (CRESTOR) 20 MG tablet Take 1  tablet (20 mg total) by mouth daily. 90 tablet 3  ? sacubitril-valsartan (ENTRESTO) 49-51 MG Take 1 tablet by mouth 2 (two) times daily. (Patient taking differently: Pt reports taking 24/26mg  tablet twice daily as he did not want to increase this before gallbladder surgery) 60 tablet 5  ? silver sulfADIAZINE (SILVADENE) 1 % cream Apply pea-sized amount to wound daily. 50 g 0  ? terbinafine (LAMISIL) 250 MG tablet Take 1 tablet (250 mg total) by mouth daily. 30 tablet 2  ? tobramycin (TOBREX) 0.3 % ophthalmic solution Place 1 drop into both  eyes See admin instructions. Instill 1 drop both eyes 3 times daily the day before eye injection every 6-8 weeks    ? TRULICITY [id number] SOPN INJECT 0.75 MG INTO THE SKIN ONCE A WEEK. 2 mL 4  ? ?No current facility-administered medications on file prior to visit.  ? ? ?Allergies  ?Allergen Reactions  ? Lipitor [Atorvastatin] Other (See Comments)  ?  Lightheaded & dizzy  ? ?Assessment/Plan: ? ?1. CHF -  ? ?Patient seen with Debria Garret, [place] pharmacy student ? ?

## 2021-07-20 ENCOUNTER — Other Ambulatory Visit: Payer: Self-pay | Admitting: Infectious Disease

## 2021-07-20 ENCOUNTER — Ambulatory Visit: Payer: BC Managed Care – PPO

## 2021-07-20 DIAGNOSIS — M869 Osteomyelitis, unspecified: Secondary | ICD-10-CM

## 2021-07-20 NOTE — Progress Notes (Signed)
Discussed with PEER to PEER with his insurer ? ?They want plain films. I am ordering outpatient Xray AFTER  ? ?That we can consider MRI (which they may then approve) ?

## 2021-07-21 ENCOUNTER — Ambulatory Visit
Admission: RE | Admit: 2021-07-21 | Discharge: 2021-07-21 | Disposition: A | Discharge status: Home / Self Care | Payer: BC Managed Care – PPO | Source: Ambulatory Visit | Attending: Infectious Disease | Admitting: Infectious Disease

## 2021-07-21 DIAGNOSIS — M869 Osteomyelitis, unspecified: Secondary | ICD-10-CM

## 2021-07-22 ENCOUNTER — Ambulatory Visit (HOSPITAL_COMMUNITY)
Admission: RE | Admit: 2021-07-22 | Payer: BC Managed Care – PPO | Source: Ambulatory Visit | Attending: Cardiovascular Disease | Admitting: Cardiovascular Disease

## 2021-07-25 ENCOUNTER — Ambulatory Visit (HOSPITAL_COMMUNITY): Payer: BC Managed Care – PPO

## 2021-07-25 ENCOUNTER — Telehealth: Payer: Self-pay

## 2021-07-25 NOTE — Telephone Encounter (Signed)
-----   Message from Truman Hayward, MD sent at 07/25/2021  8:31 AM EDT ----- ?His xray shows what appears to be new osteomyelitis. I cannot for some reason message Dr. March Rummage. Can we ask [facility] to evaluate pt and I am happy to order an MRI as well but insurance wanted me to get plain films first ?----- Message ----- ?From: Interface, Rad Results In ?Sent: 07/22/2021   3:24 PM EDT ?To: Truman Hayward, MD ? ? ?

## 2021-07-29 ENCOUNTER — Ambulatory Visit: Payer: BC Managed Care – PPO | Admitting: Podiatry

## 2021-07-29 ENCOUNTER — Encounter: Payer: Self-pay | Admitting: Podiatry

## 2021-07-29 DIAGNOSIS — M86671 Other chronic osteomyelitis, right ankle and foot: Secondary | ICD-10-CM

## 2021-07-29 DIAGNOSIS — I739 Peripheral vascular disease, unspecified: Secondary | ICD-10-CM | POA: Diagnosis not present

## 2021-07-30 ENCOUNTER — Other Ambulatory Visit: Payer: Self-pay | Admitting: Cardiovascular Disease

## 2021-08-01 NOTE — Progress Notes (Signed)
Subjective: ?56 year old male presents the office today for concern for osteomyelitis to his right second toe.  The patient's previous had multiple foot surgeries on the right foot performed Dr. Samuella Cota.  He has been closely monitored by infectious disease and CRP, sed rate has been elevated.  Due to this x-rays were performed which did reveal osteomyelitis of the second digit.  The patient has not noticed any open sores or any swelling or redness or any pain associated with this. ? ? ?Objective: ?AAO x3, NAD ?DP/PT pulses palpable bilaterally, CRT less than 3 seconds ?Mild hyperkeratotic tissue present distal aspect of right second toe and upon debridement there is no underlying drainage or pus or any ulceration.  There is slight edema without any erythema to the toe there is no fluctuation or crepitation and there is no malodor.  No pain with calf compression, swelling, warmth, erythema ? ?Assessment: ?Osteomyelitis, subacute right second toe ? ?Plan: ?-All treatment options discussed with the patient including all alternatives, risks, complications.  ?-Independent reviewed the x-rays that were done previously which did reveal cortical resorption of the distal phalanx.  At this time I do recommend an MRI as he is clinically stable to further evaluate ossifies the second toe but also to rule out any underlying osteomyelitis of the first metatarsal.  I discussed with him possible amputation second toe but I like to do MRI prior to proceed with surgery.  He is to get a close monitoring of this and should there be any increase swelling or any signs or symptoms of infection to call the office or report to emergency department. ?-He has recently followed up with Dr. Allyson Sabal regarding circulation in February.  We will copy him on today's visit to see if anything needs to be done for his circulation should he need to undergo surgery for his right second toe. ?-Patient encouraged to call the office with any questions,  concerns, change in symptoms.  ? ?Vivi Barrack DPM ? ?

## 2021-08-11 ENCOUNTER — Ambulatory Visit: Payer: BC Managed Care – PPO | Admitting: Podiatry

## 2021-08-11 VITALS — Temp 97.9°F

## 2021-08-11 DIAGNOSIS — M86671 Other chronic osteomyelitis, right ankle and foot: Secondary | ICD-10-CM

## 2021-08-12 ENCOUNTER — Ambulatory Visit: Payer: BC Managed Care – PPO | Admitting: Cardiovascular Disease

## 2021-08-12 ENCOUNTER — Other Ambulatory Visit: Payer: BC Managed Care – PPO

## 2021-08-12 NOTE — Progress Notes (Deleted)
No chief complaint on file.   History of Present Illness: 56 yo male with history of diabetes mellitus, HTN, hyperlipidemia, morbid obesity, PAD, osteomyelitis, non-ischemic cardiomyopathy, chronic combined systolic and diastolic CHF and sleep apnea who is here today for follow up. He had been followed in our office in the past by Dr. Patty Sermons and then Norma Fredrickson, NP. I saw him once in October 2022. Echo in May 2016 with LVEF=20-25% but follow up echo in August 2016 with LVEF=50-55%. He has PAD with right great toe amputation in April 2022. Dr. Allyson Sabal follows his PAD. 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 Echo November 2022 with LVEF=45-50%. His volume has been controlled with daily Lasix. Entresto started this spring. He has been statin intolerant.   He is here today for follow up. The patient denies any chest pain, dyspnea, palpitations, lower extremity edema, orthopnea, PND, dizziness, near syncope or syncope.   Primary Care Physician: Joaquim Nam, MD  Past Medical History:  Diagnosis Date   Acute osteomyelitis of toe ([facility]) 08/26/2020   Allergy    CHF (congestive heart failure) ([facility])    Diabetes mellitus    type 2   E coli infection 09/20/2020   Group B streptococcal infection 09/20/2020   Hyperlipidemia    Hypertension    Hypertension    Morbid obesity ([facility])    BMI 50   Peripheral vascular disease ([facility])    Sleep apnea    pt states he no longer has sleep apnea since tonsillectomy    Past Surgical History:  Procedure Laterality Date   ABDOMINAL AORTOGRAM W/LOWER EXTREMITY N/A 07/14/2020   Procedure: ABDOMINAL AORTOGRAM W/LOWER EXTREMITY;  Surgeon: Iran Ouch, MD;  Location: [facility];  Service: Cardiovascular;  Laterality: N/A;   AMPUTATION Right 07/21/2020   Procedure: AMPUTATION RIGHT GREAT TOE;  Surgeon: Park Liter, DPM;  Location: [facility];  Service: Podiatry;  Laterality: Right;   GRAFT APPLICATION Right 11/19/2020    Procedure: GRAFT APPLICATION;  Surgeon: Park Liter, DPM;  Location: [facility];  Service: Podiatry;  Laterality: Right;   IRRIGATION AND DEBRIDEMENT FOOT Right 07/21/2020   Procedure: IRRIGATION AND DEBRIDEMENT RIGHT GREAT TOE;  Surgeon: Park Liter, DPM;  Location: [facility];  Service: Podiatry;  Laterality: Right;   METATARSAL HEAD EXCISION Right 07/23/2020   Procedure: METATARSAL HEAD EXCISION;  Surgeon: Park Liter, DPM;  Location: [facility];  Service: Podiatry;  Laterality: Right;   TONSILLECTOMY     WOUND DEBRIDEMENT Right 07/23/2020   Procedure: DEBRIDEMENT WOUND WITH DELAYED WOUND CLOSURE;  Surgeon: Park Liter, DPM;  Location: [facility];  Service: Podiatry;  Laterality: Right;   WOUND DEBRIDEMENT Right 09/17/2020   Procedure: DEBRIDEMENT OF RIGHT FOOT WOUND, PARTIAL WOUND CLOSURE, APPLICATION OF SKIN GRAFT SUBSTITUTE;  Surgeon: Park Liter, DPM;  Location: [facility];  Service: Podiatry;  Laterality: Right;   WOUND DEBRIDEMENT Right 10/15/2020   Procedure: DEBRIDEMENT WOUND with Application of skin graft substitute;  Surgeon: Park Liter, DPM;  Location: [facility];  Service: Podiatry;  Laterality: Right;   WOUND DEBRIDEMENT Right 11/19/2020   Procedure: DEBRIDEMENT WOUND;  Surgeon: Park Liter, DPM;  Location: [facility];  Service: Podiatry;  Laterality: Right;    Current Outpatient Medications  Medication Sig Dispense Refill   carvedilol (COREG) 25 MG tablet Take 1 tablet (25 mg total) by mouth 2 (two) times daily with a meal. (Patient taking differently: Take 12.5 mg by  mouth 2 (two) times daily with a meal.) 180 tablet 3   clopidogrel (PLAVIX) 75 MG tablet Take 1 tablet (75 mg total) by mouth daily. 90 tablet 3   Continuous Blood Gluc Sensor (FREESTYLE LIBRE 14 DAY SENSOR) MISC Use to check blood sugars daily. Dx: E11.9 1 each 12   Continuous Blood Gluc Sensor (FREESTYLE LIBRE 2 SENSOR) MISC Please specify directions, refills and quantity 2 each 2   dapagliflozin propanediol (FARXIGA)  10 MG TABS tablet Take 1 tablet (10 mg total) by mouth daily before breakfast. (Patient taking differently: Take 5 mg by mouth daily before breakfast.) 90 tablet 3   dicyclomine (BENTYL) 20 MG tablet Take 1 tablet (20 mg total) by mouth every 8 (eight) hours as needed for spasms (pain). (Patient not taking: Reported on 07/11/2021) 15 tablet 0   furosemide (LASIX) 40 MG tablet Take 40 mg by mouth daily.     glucose blood test strip CHECK BLOOD SUGAR TWICE DAILY 100 each 3   hydrALAZINE (APRESOLINE) 25 MG tablet TAKE 1 TABLET BY MOUTH THREE TIMES A DAY 270 tablet 1   metFORMIN (GLUCOPHAGE) 500 MG tablet TAKE 1 TABLET BY MOUTH TWICE A DAY THEN INCREASE TO 2 TABS IN MORNING AND 1 TAB EVENING (Patient taking differently: Take 500 mg by mouth 2 (two) times daily with a meal. Takes 2 tabs in the AM and 1 tab I  the PM) 270 tablet 3   ondansetron (ZOFRAN-ODT) 4 MG disintegrating tablet Take 1 tablet (4 mg total) by mouth every 8 (eight) hours as needed for nausea or vomiting. 5 tablet 0   oxyCODONE-acetaminophen (PERCOCET) 5-325 MG tablet Take 1 tablet by mouth every 4 (four) hours as needed for severe pain. 20 tablet 0   rosuvastatin (CRESTOR) 20 MG tablet Take 1 tablet (20 mg total) by mouth daily. 90 tablet 3   sacubitril-valsartan (ENTRESTO) 49-51 MG Take 1 tablet by mouth 2 (two) times daily. (Patient taking differently: Pt reports taking 24/26mg  tablet twice daily as he did not want to increase this before gallbladder surgery) 60 tablet 5   silver sulfADIAZINE (SILVADENE) 1 % cream Apply pea-sized amount to wound daily. 50 g 0   terbinafine (LAMISIL) 250 MG tablet Take 1 tablet (250 mg total) by mouth daily. 30 tablet 2   tobramycin (TOBREX) 0.3 % ophthalmic solution Place 1 drop into both eyes See admin instructions. Instill 1 drop both eyes 3 times daily the day before eye injection every 6-8 weeks     TRULICITY 0.75 MG/0.5ML SOPN INJECT 0.75 MG INTO THE SKIN ONCE A WEEK. 2 mL 4   No current  facility-administered medications for this visit.    Allergies  Allergen Reactions   Lipitor [Atorvastatin] Other (See Comments)    Lightheaded & dizzy    Social History   Socioeconomic History   Marital status: Married    Spouse name: Not on file   Number of children: Not on file   Years of education: Not on file   Highest education level: Not on file  Occupational History   Occupation: Scientist, water quality    Employer: [employer]  Tobacco Use   Smoking status: Never   Smokeless tobacco: Never  Vaping Use   Vaping Use: Never used  Substance and Sexual Activity   Alcohol use: No    Alcohol/week: 0.0 standard drinks   Drug use: No   Sexual activity: Not on file  Other Topics Concern   Not on file  Social History  Narrative   Working at [employer]    4 kids   Married, 1998   From [place]   Social Determinants of Corporate investment banker Strain: Not on [employer] Insecurity: Not on file  Transportation Needs: Not on file  Physical Activity: Not on file  Stress: Not on file  Social Connections: Not on file  Intimate Partner Violence: Not on file    Family History  Problem Relation Age of Onset   Diabetes Father    Diabetes Other        fam hx 1st degree relative   Congestive Heart Failure Other    Lupus Daughter    Sjogren's syndrome Daughter    Colon cancer Neg Hx    Prostate cancer Neg Hx    Esophageal cancer Neg Hx    Rectal cancer Neg Hx     Review of Systems:  As stated in the HPI and otherwise negative.   There were no vitals taken for this visit.  Physical Examination:  General: Well developed, well nourished, NAD  HEENT: OP clear, mucus membranes moist  SKIN: warm, dry. No rashes. Neuro: No focal deficits  Musculoskeletal: Muscle strength 5/5 all ext  Psychiatric: Mood and affect normal  Neck: No JVD, no carotid bruits, no thyromegaly, no lymphadenopathy.  Lungs:Clear bilaterally, no wheezes, rhonci, crackles Cardiovascular:  Regular rate and rhythm. No murmurs, gallops or rubs. Abdomen:Soft. Bowel sounds present. Non-tender.  Extremities: *** No lower extremity edema. Pulses are 2 + in the bilateral DP/PT.  EKG:  EKG is not *** ordered today. The ekg ordered today demonstrates   Echo 03/02/21:  1. Left ventricular ejection fraction, by estimation, is 45 to 50%. The  left ventricle has mildly decreased function. The left ventricle  demonstrates global hypokinesis. There is moderate left ventricular  hypertrophy. Left ventricular diastolic  parameters are consistent with Grade I diastolic dysfunction (impaired  relaxation).   2. Right ventricular systolic function is normal. The right ventricular  size is normal. Tricuspid regurgitation signal is inadequate for assessing  PA pressure.   3. The mitral valve is normal in structure. Trivial mitral valve  regurgitation. No evidence of mitral stenosis.   4. The aortic valve is tricuspid. There is mild calcification of the  aortic valve. Aortic valve regurgitation is not visualized. No aortic  stenosis is present.   5. The inferior vena cava is normal in size with greater than 50%  respiratory variability, suggesting right atrial pressure of 3 mmHg.   Recent Labs: 06/06/2021: ALT 21 07/11/2021: BUN 14; Creat 1.47; Hemoglobin 13.2; Platelets 266; Potassium 4.3; Sodium 139   Lipid Panel    Component Value Date/Time   CHOL 171 06/01/2021 1459   TRIG 126 06/01/2021 1459   HDL 50 06/01/2021 1459   CHOLHDL 3.4 06/01/2021 1459   CHOLHDL 5 05/06/2019 0751   VLDL 37.2 05/06/2019 0751   LDLCALC 99 06/01/2021 1459   LDLDIRECT 155.4 01/19/2011 0908     Wt Readings from Last 3 Encounters:  07/11/21 (!) 335 lb (152 kg)  05/17/21 (!) 347 lb 6.4 oz (157.6 kg)  05/09/21 (!) 351 lb 9.6 oz (159.5 kg)    Assessment and Plan:   1. Non-ischemic cardiomyopathy/Chronic combined systolic and diastolic CHF: Echo in 2022 with LVEF=45-50%. Volume status is ok. Will continue  Lasix, Coreg, Hydralazine and Entresto.   2. Hyperlipidemia: He has been on Crestor 10 mg daily. LDL 99 in February 2023. Given his PAD, LDL goal of 70 or  below. ***  3. UJW:[id number] by Dr. Allyson Sabal  4. HTN: BP is ***  5. Morbid obesity: Weight loss is encouraged  Current medicines are reviewed at length with the patient today.  The patient does not have concerns regarding medicines.  The following changes have been made:  no change  Labs/ tests ordered today include:   No orders of the defined types were placed in this encounter.     Disposition:   F/U with me or office APP in 4-6 weeks.    Signed, Verne Carrow, MD 08/12/2021 12:20 PM    [facility] [address] [place], [place], [place]  [address] Phone: [phone]; Fax: [phone]

## 2021-08-15 NOTE — Progress Notes (Signed)
Subjective: ?56 year old male presents the office with his wife for follow-up evaluation of osteomyelitis of the right second toe.  He has not had any change to the foot since I last saw him.  He is back on antibiotics.  Denies any fevers or chills.  No current swelling or redness.  No other concerns today. ? ?Objective: ?AAO x3, NAD ?DP/PT pulses palpable bilaterally, CRT less than 3 seconds ?Minimal edema present to the distal half of the right second toe with is no open lesion.  Minimal hyperkeratotic tissue without any ulceration noted on the second toe.  There is no erythema or warmth. ?No pain with calf compression, swelling, warmth, erythema ? ?Assessment: ?Likely subacute osteomyelitis ? ?Plan: ?-All treatment options discussed with the patient including all alternatives, risks, complications.  ?-At this point clinically appears to be stable.  He was not able to get the MRI.  I reordered this today I personally contacted [facility] imaging of the authorization has been completed and they will schedule him.  Also give the patient contact formation for [facility] imaging in order to get this scheduled.  In the meantime he is to keep a close monitoring of this and if there is any worsening to let me know immediately or go to the emergency room. ?-Patient encouraged to call the office with any questions, concerns, change in symptoms.  ? ?Trula Slade DPM ? ?

## 2021-08-16 ENCOUNTER — Other Ambulatory Visit: Payer: Self-pay

## 2021-08-16 ENCOUNTER — Telehealth: Payer: Self-pay

## 2021-08-16 NOTE — Telephone Encounter (Signed)
Voicemail full 1st attempt unable to lmom to r/s will route this call to myself to follow up on a couple more times ?

## 2021-08-16 NOTE — Telephone Encounter (Signed)
-----   Message from Awilda Metro, RPH-CPP sent at 08/16/2021  7:36 AM EDT ----- ?Pt no showed lab appt on 5/5, please call to reschedule ? ?

## 2021-08-17 ENCOUNTER — Telehealth: Payer: Self-pay

## 2021-08-17 ENCOUNTER — Telehealth: Payer: Self-pay | Admitting: Family Medicine

## 2021-08-17 NOTE — Telephone Encounter (Signed)
Spoke with pt briefly regarding appointment to see Dr. Allyson Sabal per Dr. Ardelle Anton. Pt states that he is in the middle of a transaction and needs to call us back when he is free.  ?

## 2021-08-17 NOTE — Telephone Encounter (Signed)
Late entry: attempted to call pt, voicemail is full.  ?

## 2021-08-17 NOTE — Telephone Encounter (Signed)
2nd attempt to r/s labs voicemail full and they also noshowed to md visit on the same day as well so if they call back they need to get that scheduled to  ?

## 2021-08-17 NOTE — Telephone Encounter (Signed)
Please call pt about setting up DM2 f/u when possible.  Thanks.  ?

## 2021-08-17 NOTE — Telephone Encounter (Signed)
Called patient and scheduled for 08/29/21 at 4:00 pm ?

## 2021-08-20 ENCOUNTER — Other Ambulatory Visit: Payer: BC Managed Care – PPO

## 2021-08-21 ENCOUNTER — Other Ambulatory Visit: Payer: Self-pay | Admitting: Infectious Disease

## 2021-08-22 NOTE — Telephone Encounter (Signed)
Final 3rd attempt to schedule labs but voicemailbox was full ?

## 2021-08-22 NOTE — Telephone Encounter (Signed)
Okay to refill? Was being Rx for Onychomycosis, thanks! ?

## 2021-08-23 NOTE — Telephone Encounter (Signed)
Spoke with patient, he says he does need a refill. Will send in 3 months per Dr. Daiva Eves.  ? ?Sandie Ano, RN ? ?

## 2021-08-28 ENCOUNTER — Ambulatory Visit
Admission: RE | Admit: 2021-08-28 | Discharge: 2021-08-28 | Disposition: A | Discharge status: Home / Self Care | Payer: BC Managed Care – PPO | Source: Ambulatory Visit | Attending: Podiatry | Admitting: Podiatry

## 2021-08-28 DIAGNOSIS — M86671 Other chronic osteomyelitis, right ankle and foot: Secondary | ICD-10-CM

## 2021-08-29 ENCOUNTER — Encounter: Payer: Self-pay | Admitting: Family Medicine

## 2021-08-29 ENCOUNTER — Ambulatory Visit (INDEPENDENT_AMBULATORY_CARE_PROVIDER_SITE_OTHER): Payer: BC Managed Care – PPO | Admitting: Family Medicine

## 2021-08-29 VITALS — BP 150/92 | HR 89 | Temp 97.2°F | Ht 69.0 in | Wt 338.0 lb

## 2021-08-29 DIAGNOSIS — E1169 Type 2 diabetes mellitus with other specified complication: Secondary | ICD-10-CM | POA: Diagnosis not present

## 2021-08-29 DIAGNOSIS — E118 Type 2 diabetes mellitus with unspecified complications: Secondary | ICD-10-CM | POA: Diagnosis not present

## 2021-08-29 LAB — POCT GLYCOSYLATED HEMOGLOBIN (HGB A1C): Hemoglobin A1C: 9.8 % — AB (ref 4.0–5.6)

## 2021-08-29 MED ORDER — CARVEDILOL 25 MG PO TABS: 12.5000 mg | ORAL_TABLET | Freq: Two times a day (BID) | ORAL | Status: DC

## 2021-08-29 MED ORDER — FUROSEMIDE 40 MG PO TABS: 20.0000 mg | ORAL_TABLET | Freq: Every day | ORAL | Status: DC

## 2021-08-29 MED ORDER — METFORMIN HCL 500 MG PO TABS: ORAL_TABLET | ORAL | Status: DC

## 2021-08-29 MED ORDER — TRULICITY 1.5 MG/0.5ML ~~LOC~~ SOAJ: 1.5000 mg | SUBCUTANEOUS | 5 refills | Status: DC

## 2021-08-29 MED ORDER — DAPAGLIFLOZIN PROPANEDIOL 10 MG PO TABS: ORAL_TABLET | ORAL | Status: DC

## 2021-08-29 NOTE — Patient Instructions (Signed)
Try to increase to 1.5mg  trulicity.  Update me if not tolerated.  Plan on recheck in about 3 months.   Let me know if your sugar isn't improving in the meantime.  Take care.  Glad to see you.

## 2021-08-29 NOTE — Progress Notes (Unsigned)
Diabetes:  Using medications without difficulties: yes Hypoglycemic episodes:no Hyperglycemic episodes:no Feet problems: see below.   Blood Sugars averaging: 150-160 average.  Metformin/trulicity/farxiga.   No ADE med.   Eat right diet d/w pt.   Handout given.    Most recent MRI d/w pt:  IMPRESSION: 1. Postsurgical changes for prior amputation of the first ray through the mid metatarsal shaft.   2.  No MR evidence of acute osteomyelitis.   3. Generalized subcutaneous soft tissue edema prominent about dorsum of the foot. No fluid collection or abscess.      PMH and SH reviewed  Meds, vitals, and allergies reviewed.   ROS: Per HPI unless specifically indicated in ROS section   GEN: nad, alert and oriented HEENT: mucous membranes moist NECK: supple w/o LA CV: rrr. PULM: ctab, no inc wob ABD: soft, +bs EXT: no edema SKIN: no acute rash

## 2021-08-30 ENCOUNTER — Encounter: Payer: Self-pay | Admitting: Family Medicine

## 2021-08-31 NOTE — Assessment & Plan Note (Signed)
He needs better sugar control.  Discussed diet, low carbohydrate handout given and discussed Try to increase to 1.5mg  trulicity.  Update me if not tolerated.  Plan on recheck in about 3 months.   He can let me know if his sugar isn't improving in the meantime. Continue Farxiga and metformin.

## 2021-09-06 ENCOUNTER — Other Ambulatory Visit: Payer: Self-pay | Admitting: Family Medicine

## 2021-09-06 DIAGNOSIS — I5022 Chronic systolic (congestive) heart failure: Secondary | ICD-10-CM

## 2021-09-09 ENCOUNTER — Ambulatory Visit: Payer: BC Managed Care – PPO | Admitting: Infectious Disease

## 2021-09-12 ENCOUNTER — Other Ambulatory Visit: Payer: Self-pay

## 2021-09-12 ENCOUNTER — Ambulatory Visit (INDEPENDENT_AMBULATORY_CARE_PROVIDER_SITE_OTHER): Payer: BC Managed Care – PPO | Admitting: Infectious Disease

## 2021-09-12 ENCOUNTER — Encounter: Payer: Self-pay | Admitting: Infectious Disease

## 2021-09-12 VITALS — BP 181/94 | HR 79 | Temp 97.5°F | Wt 344.0 lb

## 2021-09-12 DIAGNOSIS — A498 Other bacterial infections of unspecified site: Secondary | ICD-10-CM | POA: Diagnosis not present

## 2021-09-12 DIAGNOSIS — A491 Streptococcal infection, unspecified site: Secondary | ICD-10-CM | POA: Diagnosis not present

## 2021-09-12 DIAGNOSIS — M86179 Other acute osteomyelitis, unspecified ankle and foot: Secondary | ICD-10-CM | POA: Diagnosis not present

## 2021-09-12 DIAGNOSIS — E1169 Type 2 diabetes mellitus with other specified complication: Secondary | ICD-10-CM

## 2021-09-12 DIAGNOSIS — I10 Essential (primary) hypertension: Secondary | ICD-10-CM

## 2021-09-12 NOTE — Patient Instructions (Signed)
Stop antibiotics  Monitor for increased pain swelling or other signs of infection if they occur let me and Dr. Ardelle Anton know immediately

## 2021-09-12 NOTE — Progress Notes (Signed)
Subjective:    Chief complaint:     Patient ID: Austin Shaw, male    DOB: 05/08/1965, 56 y.o.   MRN: [id number]  HPI  56 y.o. male with these mellitus peripheral vascular disease status post revascularization with gangrenous toe with osteomyelitis status post I&D of bursal collection and amputation of first toe then repeat trip to the OR for debridement and resection with cultures from both surgeries yielding group B streptococcus and E. coli that was fairly sensitive.  We  placed him on IV cefazolin along with oral metronidazole.   He had been taking both medications religiously including the metronidazole which she tries to take as quickly as possible to avoid the unpleasant taste gives and ones mouth.   I saw him last on May 19 that was encouraged by the appearance of his wound but discouraged that his inflammatory markers had increased.   We switched him from IV ceftriaxone and oral metronidazole to Augmentin.  Has been taking that since then.  Unfortunately his wound required further debridement and he underwent debridement irrigation of his right foot wound on the third June by Dr. Samuella Cota but with placement of skin graft over the wound apparently there have been concerned that there will be need for bone biopsy but this was not performed.  Then he had apparent failure of the graft and need for further debridement and placement of a new skin graft.  The last saw him he has had several debridements but his wound is healing up quite nicely with just 1 residual open area.   His last visit I was going to stop his antibiotics was inflammatory markers in particular his sed rate had markedly elevated.  He is continued on Augmentin since then and been following with Dr. Samuella Cota.  She had some debridement of callus when seen by Dr. Samuella Cota in 29 April 2021.  I saw him in late January and had intended to stop antibiotics his inflammatory markers were trending in the right direction  but he is continued on them.  Wound continues to look quite stable.  He is occasional pain off and on but overall trajectory was  encouraging.  When I last saw him in April the inflammatory markers were up again.  We ordered an MRI of the foot performed on Aug 28, 2021.  Bone was read as showing largely what radiology felt were postsurgical changes from prior first ray amputations through to the mid metatarsal shaft.  There is no marrow signal abnormality   I do not find MRI evidence of new osteomyelitis.  He has been seen by Dr. Ardelle Anton and he has proposed idea of affecting the bone in the area that is thought to reflect postoperative changes.   Austin Shaw is not interested in that approach at this time.  He is experienced no increased pain he has no systemic symptoms of infection and his wound looks well-healed.       Past Medical History:  Diagnosis Date   Acute osteomyelitis of toe (HCC) 08/26/2020   Allergy    CHF (congestive heart failure) (HCC)    Diabetes mellitus    type 2   E coli infection 09/20/2020   Group B streptococcal infection 09/20/2020   Hyperlipidemia    Hypertension    Hypertension    Morbid obesity (HCC)    BMI 50   Peripheral vascular disease (HCC)    Sleep apnea    pt states he no longer has sleep apnea since tonsillectomy  Past Surgical History:  Procedure Laterality Date   ABDOMINAL AORTOGRAM W/LOWER EXTREMITY N/A 07/14/2020   Procedure: ABDOMINAL AORTOGRAM W/LOWER EXTREMITY;  Surgeon: Wellington Hampshire, MD;  Location: [facility];  Service: Cardiovascular;  Laterality: N/A;   AMPUTATION Right 07/21/2020   Procedure: AMPUTATION RIGHT GREAT TOE;  Surgeon: Evelina Bucy, DPM;  Location: [place];  Service: Podiatry;  Laterality: Right;   GRAFT APPLICATION Right [id number]   Procedure: GRAFT APPLICATION;  Surgeon: Evelina Bucy, DPM;  Location: [place];  Service: Podiatry;  Laterality: Right;   IRRIGATION AND DEBRIDEMENT FOOT Right 07/21/2020    Procedure: IRRIGATION AND DEBRIDEMENT RIGHT GREAT TOE;  Surgeon: Evelina Bucy, DPM;  Location: [place];  Service: Podiatry;  Laterality: Right;   METATARSAL HEAD EXCISION Right 07/23/2020   Procedure: METATARSAL HEAD EXCISION;  Surgeon: Evelina Bucy, DPM;  Location: [place];  Service: Podiatry;  Laterality: Right;   TONSILLECTOMY     WOUND DEBRIDEMENT Right 07/23/2020   Procedure: DEBRIDEMENT WOUND WITH DELAYED WOUND CLOSURE;  Surgeon: Evelina Bucy, DPM;  Location: [place];  Service: Podiatry;  Laterality: Right;   WOUND DEBRIDEMENT Right 09/17/2020   Procedure: DEBRIDEMENT OF RIGHT FOOT WOUND, PARTIAL WOUND CLOSURE, APPLICATION OF SKIN GRAFT SUBSTITUTE;  Surgeon: Evelina Bucy, DPM;  Location: [place];  Service: Podiatry;  Laterality: Right;   WOUND DEBRIDEMENT Right 10/15/2020   Procedure: DEBRIDEMENT WOUND with Application of skin graft substitute;  Surgeon: Evelina Bucy, DPM;  Location: [place];  Service: Podiatry;  Laterality: Right;   WOUND DEBRIDEMENT Right 11/19/2020   Procedure: DEBRIDEMENT WOUND;  Surgeon: Evelina Bucy, DPM;  Location: [place];  Service: Podiatry;  Laterality: Right;    Family History  Problem Relation Age of Onset   Diabetes Father    Diabetes Other        fam hx 1st degree relative   Congestive Heart Failure Other    Lupus Daughter    Sjogren's syndrome Daughter    Colon cancer Neg Hx    Prostate cancer Neg Hx    Esophageal cancer Neg Hx    Rectal cancer Neg Hx       Social History   Socioeconomic History   Marital status: Married    Spouse name: Not on file   Number of children: Not on file   Years of education: Not on file   Highest education level: Not on file  Occupational History   Occupation: Passenger transport manager    Employer: [employer]  Tobacco Use   Smoking status: Never   Smokeless tobacco: Never  Vaping Use   Vaping Use: Never used  Substance and Sexual Activity   Alcohol use: No    Alcohol/week: 0.0 standard drinks    Drug use: No   Sexual activity: Not on file  Other Topics Concern   Not on file  Social History Narrative   Working at [employer].    4 kids   Married, 1998   From [place]   Social Determinants of Health   Financial Resource Strain: Not on [employer] Insecurity: Not on file  Transportation Needs: Not on file  Physical Activity: Not on file  Stress: Not on file  Social Connections: Not on file    Allergies  Allergen Reactions   Lipitor [Atorvastatin] Other (See Comments)    Lightheaded & dizzy     Current Outpatient Medications:    amoxicillin-clavulanate (AUGMENTIN) 875-125 MG tablet, Take 1 tablet by mouth  2 (two) times daily., Disp: , Rfl:    carvedilol (COREG) 25 MG tablet, Take 0.5 tablets (12.5 mg total) by mouth 2 (two) times daily with a meal., Disp: , Rfl:    clopidogrel (PLAVIX) 75 MG tablet, Take 1 tablet (75 mg total) by mouth daily., Disp: 90 tablet, Rfl: 3   Continuous Blood Gluc Sensor (FREESTYLE LIBRE 14 DAY SENSOR) MISC, Use to check blood sugars daily. Dx: E11.9, Disp: 1 each, Rfl: 12   Continuous Blood Gluc Sensor (FREESTYLE LIBRE 2 SENSOR) MISC, Please specify directions, refills and quantity, Disp: 2 each, Rfl: 2   dapagliflozin propanediol (FARXIGA) 10 MG TABS tablet, 1/2 tab daily., Disp: , Rfl:    dicyclomine (BENTYL) 20 MG tablet, Take 1 tablet (20 mg total) by mouth every 8 (eight) hours as needed for spasms (pain)., Disp: 15 tablet, Rfl: 0   Dulaglutide (TRULICITY) 1.5 0000000 SOPN, Inject 1.5 mg into the skin once a week., Disp: 2 mL, Rfl: 5   furosemide (LASIX) 40 MG tablet, Take 0.5 tablets (20 mg total) by mouth daily., Disp: 30 tablet, Rfl:    glucose blood test strip, CHECK BLOOD SUGAR TWICE DAILY, Disp: 100 each, Rfl: 3   hydrALAZINE (APRESOLINE) 25 MG tablet, TAKE 1 TABLET BY MOUTH THREE TIMES A DAY, Disp: 270 tablet, Rfl: 1   metFORMIN (GLUCOPHAGE) 500 MG tablet, Takes 2 tabs in the AM and 1 tab I  the PM, Disp: , Rfl:    ondansetron  (ZOFRAN-ODT) 4 MG disintegrating tablet, Take 1 tablet (4 mg total) by mouth every 8 (eight) hours as needed for nausea or vomiting., Disp: 5 tablet, Rfl: 0   rosuvastatin (CRESTOR) 20 MG tablet, Take 1 tablet (20 mg total) by mouth daily., Disp: 90 tablet, Rfl: 3   sacubitril-valsartan (ENTRESTO) 24-26 MG, Take 1 tablet by mouth 2 (two) times daily., Disp: , Rfl:    silver sulfADIAZINE (SILVADENE) 1 % cream, Apply pea-sized amount to wound daily., Disp: 50 g, Rfl: 0   terbinafine (LAMISIL) 250 MG tablet, TAKE 1 TABLET BY MOUTH EVERY DAY, Disp: 30 tablet, Rfl: 2   tobramycin (TOBREX) 0.3 % ophthalmic solution, Place 1 drop into both eyes See admin instructions. Instill 1 drop both eyes 3 times daily the day before eye injection every 6-8 weeks, Disp: , Rfl:    Review of Systems  Constitutional:  Negative for activity change, appetite change, chills, diaphoresis, fatigue, fever and unexpected weight change.  HENT:  Negative for congestion, rhinorrhea, sinus pressure, sneezing, sore throat and trouble swallowing.   Eyes:  Negative for photophobia and visual disturbance.  Respiratory:  Negative for cough, chest tightness, shortness of breath, wheezing and stridor.   Cardiovascular:  Negative for chest pain, palpitations and leg swelling.  Gastrointestinal:  Negative for abdominal distention, abdominal pain, anal bleeding, blood in stool, constipation, diarrhea, nausea and vomiting.  Genitourinary:  Negative for difficulty urinating, dysuria, flank pain and hematuria.  Musculoskeletal:  Negative for arthralgias, back pain, gait problem, joint swelling and myalgias.  Skin:  Negative for color change, pallor, rash and wound.  Neurological:  Negative for dizziness, tremors, weakness, light-headedness and headaches.  Hematological:  Negative for adenopathy. Does not bruise/bleed easily.  Psychiatric/Behavioral:  Negative for agitation, behavioral problems, confusion, decreased concentration, dysphoric  mood, sleep disturbance and suicidal ideas.       Objective:   Physical Exam Constitutional:      Appearance: He is well-developed.  HENT:     Head: Normocephalic and atraumatic.  Eyes:  Conjunctiva/sclera: Conjunctivae normal.  Cardiovascular:     Rate and Rhythm: Normal rate and regular rhythm.  Pulmonary:     Effort: Pulmonary effort is normal. No respiratory distress.     Breath sounds: No wheezing.  Abdominal:     General: There is no distension.     Palpations: Abdomen is soft.  Musculoskeletal:        General: No tenderness. Normal range of motion.     Cervical back: Normal range of motion and neck supple.  Skin:    General: Skin is warm and dry.     Coloration: Skin is not pale.     Findings: No erythema or rash.  Neurological:     General: No focal deficit present.     Mental Status: He is alert and oriented to person, place, and time.  Psychiatric:        Mood and Affect: Mood normal.        Behavior: Behavior normal.        Thought Content: Thought content normal.        Judgment: Judgment normal.   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  Dr. Jacqualyn Posey is willing to resect the areas of post surgical changes but Heriberto does not go further surgery at this point time.  Think this is not unreasonable I will check labs today  including the sed rate and CRP again but even if they are elevated would like to trial him off antibiotics so that we can prove that he either does or does not need surgery due to infection that is still present but would need antibiotics to be lifted for it to raise its head    DM following w Dr. Damita Dunnings    There were no vitals filed for this visit.

## 2021-09-13 ENCOUNTER — Encounter: Payer: Self-pay | Admitting: *Deleted

## 2021-09-13 ENCOUNTER — Other Ambulatory Visit: Payer: Self-pay | Admitting: Family Medicine

## 2021-09-13 LAB — BASIC METABOLIC PANEL WITH GFR
BUN: 18 mg/dL (ref 7–25)
CO2: 28 mmol/L (ref 20–32)
Calcium: 9.2 mg/dL (ref 8.6–10.3)
Chloride: 102 mmol/L (ref 98–110)
Creat: 1.27 mg/dL (ref 0.70–1.30)
Glucose, Bld: 258 mg/dL — ABNORMAL HIGH (ref 65–99)
Potassium: 4.1 mmol/L (ref 3.5–5.3)
Sodium: 138 mmol/L (ref 135–146)
eGFR: 67 mL/min/{1.73_m2} (ref >=60)

## 2021-09-13 LAB — CBC WITH DIFFERENTIAL/PLATELET
Absolute Monocytes: 497 cells/uL (ref 200–950)
Basophils Absolute: 57 cells/uL (ref 0–200)
Basophils Relative: 0.8 %
Eosinophils Absolute: 320 cells/uL (ref 15–500)
Eosinophils Relative: 4.5 %
HCT: 41.2 % (ref 38.5–50.0)
Hemoglobin: 13.1 g/dL — ABNORMAL LOW (ref 13.2–17.1)
Lymphs Abs: 2151 cells/uL (ref 850–3900)
MCH: 28.8 pg (ref 27.0–33.0)
MCHC: 31.8 g/dL — ABNORMAL LOW (ref 32.0–36.0)
MCV: 90.5 fL (ref 80.0–100.0)
MPV: 12.1 fL (ref 7.5–12.5)
Monocytes Relative: 7 %
Neutro Abs: 4075 cells/uL (ref 1500–7800)
Neutrophils Relative %: 57.4 %
Platelets: 209 10*3/uL (ref 140–400)
RBC: 4.55 10*6/uL (ref 4.20–5.80)
RDW: 13 % (ref 11.0–15.0)
Total Lymphocyte: 30.3 %
WBC: 7.1 10*3/uL (ref 3.8–10.8)

## 2021-09-13 LAB — SEDIMENTATION RATE: Sed Rate: 55 mm/h — ABNORMAL HIGH (ref 0–20)

## 2021-09-13 LAB — C-REACTIVE PROTEIN: CRP: 12.6 mg/L — ABNORMAL HIGH (ref <8.0)

## 2021-11-02 ENCOUNTER — Other Ambulatory Visit: Payer: Self-pay | Admitting: Cardiovascular Disease

## 2021-12-07 ENCOUNTER — Ambulatory Visit: Payer: BC Managed Care – PPO | Admitting: Infectious Disease

## 2021-12-14 ENCOUNTER — Ambulatory Visit
Admission: RE | Admit: 2021-12-14 | Discharge: 2021-12-14 | Disposition: A | Discharge status: Home / Self Care | Payer: BC Managed Care – PPO | Source: Ambulatory Visit | Attending: Infectious Disease | Admitting: Infectious Disease

## 2021-12-14 ENCOUNTER — Other Ambulatory Visit: Payer: Self-pay

## 2021-12-14 ENCOUNTER — Encounter: Payer: Self-pay | Admitting: Infectious Disease

## 2021-12-14 ENCOUNTER — Ambulatory Visit (INDEPENDENT_AMBULATORY_CARE_PROVIDER_SITE_OTHER): Payer: BC Managed Care – PPO | Admitting: Infectious Disease

## 2021-12-14 VITALS — BP 149/85 | HR 77 | Temp 97.9°F | Resp 16 | Ht 68.0 in | Wt 350.8 lb

## 2021-12-14 DIAGNOSIS — A491 Streptococcal infection, unspecified site: Secondary | ICD-10-CM | POA: Diagnosis not present

## 2021-12-14 DIAGNOSIS — A498 Other bacterial infections of unspecified site: Secondary | ICD-10-CM

## 2021-12-14 DIAGNOSIS — M86179 Other acute osteomyelitis, unspecified ankle and foot: Secondary | ICD-10-CM

## 2021-12-14 DIAGNOSIS — L97519 Non-pressure chronic ulcer of other part of right foot with unspecified severity: Secondary | ICD-10-CM

## 2021-12-14 DIAGNOSIS — E11621 Type 2 diabetes mellitus with foot ulcer: Secondary | ICD-10-CM

## 2021-12-14 NOTE — Progress Notes (Signed)
Subjective:    Chief complaint: Follow-up for diabetic foot infection and osteomyelitis    Patient ID: Austin Shaw, male    DOB: [birth year], 56 y.o.   MRN: [id number]  HPI  56 y.o. male with these mellitus peripheral vascular disease status post revascularization with gangrenous toe with osteomyelitis status post I&D of bursal collection and amputation of first toe then repeat trip to the OR for debridement and resection with cultures from both surgeries yielding group B streptococcus and E. coli that was fairly sensitive.  We  placed him on IV cefazolin along with oral metronidazole.   He had been taking both medications religiously including the metronidazole which she tries to take as quickly as possible to avoid the unpleasant taste gives and ones mouth.   I saw him last on May 19 that was encouraged by the appearance of his wound but discouraged that his inflammatory markers had increased.   We switched him from IV ceftriaxone and oral metronidazole to Augmentin.  Has been taking that since then.  Unfortunately his wound required further debridement and he underwent debridement irrigation of his right foot wound on the third June by Dr. March Rummage but with placement of skin graft over the wound apparently there have been concerned that there will be need for bone biopsy but this was not performed.  Then he had apparent failure of the graft and need for further debridement and placement of a new skin graft.  The last saw him he has had several debridements but his wound is healing up quite nicely with just 1 residual open area.   His last visit I was going to stop his antibiotics was inflammatory markers in particular his sed rate had markedly elevated.  He continued on Augmentin since then and been following with Dr. March Rummage.  She had some debridement of callus when seen by Dr. March Rummage in 29 April 2021.  I saw him in late January and had intended to stop antibiotics his  inflammatory markers were trending in the right direction but he is continued on them.  Wound continues to look quite stable.  He is occasional pain off and on but overall trajectory was  encouraging.  When I last saw him in April the inflammatory markers were up again.  We ordered an MRI of the foot performed on Aug 28, 2021.  Bone was read as showing largely what radiology felt were postsurgical changes from prior first ray amputations through to the mid metatarsal shaft.  There is no marrow signal abnormality   I do not find MRI evidence of new osteomyelitis.  He has been seen by Dr. Jacqualyn Posey and he has proposed idea of affecting the bone in the area that is thought to reflect postoperative changes.   Adryel is not interested in that approach at this time.  He had experienced no increased pain he has no systemic symptoms of infection and his wound looks well-healed.  We had to observe him off antibiotics and he is doing well clinically with no increased pain or erythema or systemic evidence of infection we will recheck inflammatory markers and he also asked TIVICAY to recheck an x-ray today and then follow-up in 4 months time.       Past Medical History:  Diagnosis Date   Acute osteomyelitis of toe ([place]) 08/26/2020   Allergy    CHF (congestive heart failure) (HCC)    Diabetes mellitus    type 2   E coli infection 09/20/2020  Group B streptococcal infection 09/20/2020   Hyperlipidemia    Hypertension    Hypertension    Morbid obesity ([place])    BMI 50   Peripheral vascular disease (HCC)    Sleep apnea    pt states he no longer has sleep apnea since tonsillectomy    Past Surgical History:  Procedure Laterality Date   ABDOMINAL AORTOGRAM W/LOWER EXTREMITY N/A 07/14/2020   Procedure: ABDOMINAL AORTOGRAM W/LOWER EXTREMITY;  Surgeon: Wellington Hampshire, MD;  Location: [facility];  Service: Cardiovascular;  Laterality: N/A;   AMPUTATION Right 07/21/2020   Procedure:  AMPUTATION RIGHT GREAT TOE;  Surgeon: Evelina Bucy, DPM;  Location: [facility];  Service: Podiatry;  Laterality: Right;   GRAFT APPLICATION Right [id number]   Procedure: GRAFT APPLICATION;  Surgeon: Evelina Bucy, DPM;  Location: [place];  Service: Podiatry;  Laterality: Right;   IRRIGATION AND DEBRIDEMENT FOOT Right 07/21/2020   Procedure: IRRIGATION AND DEBRIDEMENT RIGHT GREAT TOE;  Surgeon: Evelina Bucy, DPM;  Location: [place];  Service: Podiatry;  Laterality: Right;   METATARSAL HEAD EXCISION Right 07/23/2020   Procedure: METATARSAL HEAD EXCISION;  Surgeon: Evelina Bucy, DPM;  Location: [place];  Service: Podiatry;  Laterality: Right;   TONSILLECTOMY     WOUND DEBRIDEMENT Right 07/23/2020   Procedure: DEBRIDEMENT WOUND WITH DELAYED WOUND CLOSURE;  Surgeon: Evelina Bucy, DPM;  Location: [place];  Service: Podiatry;  Laterality: Right;   WOUND DEBRIDEMENT Right 09/17/2020   Procedure: DEBRIDEMENT OF RIGHT FOOT WOUND, PARTIAL WOUND CLOSURE, APPLICATION OF SKIN GRAFT SUBSTITUTE;  Surgeon: Evelina Bucy, DPM;  Location: [place];  Service: Podiatry;  Laterality: Right;   WOUND DEBRIDEMENT Right 10/15/2020   Procedure: DEBRIDEMENT WOUND with Application of skin graft substitute;  Surgeon: Evelina Bucy, DPM;  Location: [place];  Service: Podiatry;  Laterality: Right;   WOUND DEBRIDEMENT Right 11/19/2020   Procedure: DEBRIDEMENT WOUND;  Surgeon: Evelina Bucy, DPM;  Location: [place];  Service: Podiatry;  Laterality: Right;    Family History  Problem Relation Age of Onset   Diabetes Father    Diabetes Other        fam hx 1st degree relative   Congestive Heart Failure Other    Lupus Daughter    Sjogren's syndrome Daughter    Colon cancer Neg Hx    Prostate cancer Neg Hx    Esophageal cancer Neg Hx    Rectal cancer Neg Hx       Social History   Socioeconomic History   Marital status: Married    Spouse name: Not on file   Number of children: Not on file   Years of education: Not on  file   Highest education level: Not on file  Occupational History   Occupation: Passenger transport manager    Employer: [employer]  Tobacco Use   Smoking status: Never   Smokeless tobacco: Never  Vaping Use   Vaping Use: Never used  Substance and Sexual Activity   Alcohol use: No    Alcohol/week: 0.0 standard drinks of alcohol   Drug use: No   Sexual activity: Not on file  Other Topics Concern   Not on file  Social History Narrative   Working at [employer].    4 kids   Married, 1998   From [place]   Social Determinants of Health   Financial Resource Strain: Not on [employer] Insecurity: Not on file  Transportation Needs: Not on file  Physical Activity: Not on file  Stress: Not on file  Social Connections: Not on file    Allergies  Allergen Reactions   Lipitor [Atorvastatin] Other (See Comments)    Lightheaded & dizzy     Current Outpatient Medications:    carvedilol (COREG) 25 MG tablet, Take 0.5 tablets (12.5 mg total) by mouth 2 (two) times daily with a meal., Disp: , Rfl:    clopidogrel (PLAVIX) 75 MG tablet, Take 1 tablet (75 mg total) by mouth daily., Disp: 90 tablet, Rfl: 2   Continuous Blood Gluc Sensor (FREESTYLE LIBRE 14 DAY SENSOR) MISC, Use to check blood sugars daily. Dx: E11.9, Disp: 1 each, Rfl: 12   Continuous Blood Gluc Sensor (FREESTYLE LIBRE 2 SENSOR) MISC, Please specify directions, refills and quantity, Disp: 2 each, Rfl: 2   dapagliflozin propanediol (FARXIGA) 10 MG TABS tablet, 1/2 tab daily., Disp: , Rfl:    dicyclomine (BENTYL) 20 MG tablet, Take 1 tablet (20 mg total) by mouth every 8 (eight) hours as needed for spasms (pain)., Disp: 15 tablet, Rfl: 0   Dulaglutide (TRULICITY) 1.5 MG/0.5ML SOPN, Inject 1.5 mg into the skin once a week., Disp: 2 mL, Rfl: 5   furosemide (LASIX) 40 MG tablet, TAKE 1/2 TABLET BY MOUTH DAILY AS NEEDED FOR FLUID OR EDEMA, Disp: 45 tablet, Rfl: 1   glucose blood test strip, CHECK BLOOD SUGAR TWICE DAILY, Disp: 100  each, Rfl: 3   hydrALAZINE (APRESOLINE) 25 MG tablet, TAKE 1 TABLET BY MOUTH THREE TIMES A DAY, Disp: 270 tablet, Rfl: 1   metFORMIN (GLUCOPHAGE) 500 MG tablet, Takes 2 tabs in the AM and 1 tab I  the PM, Disp: , Rfl:    ondansetron (ZOFRAN-ODT) 4 MG disintegrating tablet, Take 1 tablet (4 mg total) by mouth every 8 (eight) hours as needed for nausea or vomiting., Disp: 5 tablet, Rfl: 0   rosuvastatin (CRESTOR) 20 MG tablet, Take 1 tablet (20 mg total) by mouth daily., Disp: 90 tablet, Rfl: 3   sacubitril-valsartan (ENTRESTO) 24-26 MG, Take 1 tablet by mouth 2 (two) times daily., Disp: , Rfl:    silver sulfADIAZINE (SILVADENE) 1 % cream, Apply pea-sized amount to wound daily., Disp: 50 g, Rfl: 0   terbinafine (LAMISIL) 250 MG tablet, TAKE 1 TABLET BY MOUTH EVERY DAY, Disp: 30 tablet, Rfl: 2   tobramycin (TOBREX) 0.3 % ophthalmic solution, Place 1 drop into both eyes See admin instructions. Instill 1 drop both eyes 3 times daily the day before eye injection every 6-8 weeks, Disp: , Rfl:    Review of Systems  Constitutional:  Negative for activity change, appetite change, chills, diaphoresis, fatigue, fever and unexpected weight change.  HENT:  Negative for congestion, rhinorrhea, sinus pressure, sneezing, sore throat and trouble swallowing.   Eyes:  Negative for photophobia and visual disturbance.  Respiratory:  Negative for cough, chest tightness, shortness of breath, wheezing and stridor.   Cardiovascular:  Negative for chest pain, palpitations and leg swelling.  Gastrointestinal:  Negative for abdominal distention, abdominal pain, anal bleeding, blood in stool, constipation, diarrhea, nausea and vomiting.  Genitourinary:  Negative for difficulty urinating, dysuria, flank pain and hematuria.  Musculoskeletal:  Negative for arthralgias, back pain, gait problem, joint swelling and myalgias.  Skin:  Negative for color change, pallor, rash and wound.  Neurological:  Negative for dizziness, tremors,  weakness, light-headedness and headaches.  Hematological:  Negative for adenopathy. Does not bruise/bleed easily.  Psychiatric/Behavioral:  Negative for agitation, behavioral problems, confusion, decreased concentration, dysphoric mood,  sleep disturbance and suicidal ideas.        Objective:   Physical Exam Constitutional:      Appearance: He is well-developed.  HENT:     Head: Normocephalic and atraumatic.  Eyes:     Conjunctiva/sclera: Conjunctivae normal.  Cardiovascular:     Rate and Rhythm: Normal rate and regular rhythm.  Pulmonary:     Effort: Pulmonary effort is normal. No respiratory distress.     Breath sounds: No wheezing.  Abdominal:     General: There is no distension.     Palpations: Abdomen is soft.  Musculoskeletal:        General: No tenderness. Normal range of motion.     Cervical back: Normal range of motion and neck supple.  Skin:    General: Skin is warm and dry.     Coloration: Skin is not pale.     Findings: No erythema or rash.  Neurological:     General: No focal deficit present.     Mental Status: He is alert and oriented to person, place, and time.  Psychiatric:        Mood and Affect: Mood normal.        Behavior: Behavior normal.        Thought Content: Thought content normal.        Judgment: Judgment normal.    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  We will check repeat sed rate CRP BMP and CBC with differential  I will recheck plain films of the foot.  Unless there is something  disconcerting about plain films or labs we will plan on seeing him back in 4 months time for follow-up.  He was following with Dr. Samuella Cota who now is no longer in the podiatry practice but does need follow-up with podiatry.   I spent 32  minutes with the patient including than 50% of the time in face to face counseling of the patient is diabetic foot foot infection, osteomyelitisalong with review of medical records in preparation for the visit and during the visit and in coordination of his care.

## 2021-12-15 ENCOUNTER — Telehealth: Payer: Self-pay

## 2021-12-15 LAB — CBC WITH DIFFERENTIAL/PLATELET
Absolute Monocytes: 489 cells/uL (ref 200–950)
Basophils Absolute: 73 cells/uL (ref 0–200)
Basophils Relative: 1 %
Eosinophils Absolute: 314 cells/uL (ref 15–500)
Eosinophils Relative: 4.3 %
HCT: 42.8 % (ref 38.5–50.0)
Hemoglobin: 13.8 g/dL (ref 13.2–17.1)
Lymphs Abs: 2358 cells/uL (ref 850–3900)
MCH: 29 pg (ref 27.0–33.0)
MCHC: 32.2 g/dL (ref 32.0–36.0)
MCV: 89.9 fL (ref 80.0–100.0)
MPV: 11.5 fL (ref 7.5–12.5)
Monocytes Relative: 6.7 %
Neutro Abs: 4066 cells/uL (ref 1500–7800)
Neutrophils Relative %: 55.7 %
Platelets: 224 10*3/uL (ref 140–400)
RBC: 4.76 10*6/uL (ref 4.20–5.80)
RDW: 13.3 % (ref 11.0–15.0)
Total Lymphocyte: 32.3 %
WBC: 7.3 10*3/uL (ref 3.8–10.8)

## 2021-12-15 LAB — BASIC METABOLIC PANEL WITH GFR
BUN/Creatinine Ratio: 16 (calc) (ref 6–22)
BUN: 23 mg/dL (ref 7–25)
CO2: 28 mmol/L (ref 20–32)
Calcium: 9.7 mg/dL (ref 8.6–10.3)
Chloride: 101 mmol/L (ref 98–110)
Creat: 1.45 mg/dL — ABNORMAL HIGH (ref 0.70–1.30)
Glucose, Bld: 198 mg/dL — ABNORMAL HIGH (ref 65–99)
Potassium: 4.4 mmol/L (ref 3.5–5.3)
Sodium: 140 mmol/L (ref 135–146)
eGFR: 57 mL/min/{1.73_m2} — ABNORMAL LOW (ref >=60)

## 2021-12-15 LAB — SEDIMENTATION RATE: Sed Rate: 14 mm/h (ref 0–20)

## 2021-12-15 LAB — C-REACTIVE PROTEIN: CRP: 7 mg/L (ref <8.0)

## 2021-12-15 NOTE — Telephone Encounter (Signed)
I spoke to the patient and relayed imaging results. Patient verbalized understanding and no questions.  Austin Shaw T Austin Shaw

## 2021-12-15 NOTE — Telephone Encounter (Signed)
-----   Message from Randall Hiss, MD sent at 12/15/2021  2:25 PM EDT ----- His plain films look great he actually has new bone formation we let him know his x-ray is quite reassuring ----- Message ----- From: Interface, Rad Results In Sent: 12/15/2021  12:44 PM EDT To: Randall Hiss, MD

## 2021-12-21 ENCOUNTER — Other Ambulatory Visit: Payer: Self-pay | Admitting: Family Medicine

## 2021-12-21 ENCOUNTER — Other Ambulatory Visit: Payer: Self-pay | Admitting: Cardiovascular Disease

## 2021-12-21 ENCOUNTER — Other Ambulatory Visit: Payer: Self-pay | Admitting: Infectious Disease

## 2021-12-21 NOTE — Telephone Encounter (Signed)
Pts pharmacy requesting a refill. Please address thank you

## 2021-12-21 NOTE — Telephone Encounter (Signed)
Please advise on refill request

## 2021-12-26 NOTE — Telephone Encounter (Signed)
Placed 3 mo recall and sent refill of medication for 90 day supply.

## 2022-01-09 ENCOUNTER — Encounter: Payer: Self-pay | Admitting: Family Medicine

## 2022-01-09 NOTE — Telephone Encounter (Signed)
Looks like this referral was sent 09/13/2021. Does it need to be resent.

## 2022-01-19 ENCOUNTER — Encounter: Payer: Self-pay | Admitting: Family Medicine

## 2022-01-19 ENCOUNTER — Ambulatory Visit: Payer: BC Managed Care – PPO | Admitting: Family Medicine

## 2022-01-19 VITALS — BP 118/80 | HR 99 | Temp 97.2°F | Ht 68.0 in | Wt 356.0 lb

## 2022-01-19 DIAGNOSIS — E1169 Type 2 diabetes mellitus with other specified complication: Secondary | ICD-10-CM

## 2022-01-19 DIAGNOSIS — I5022 Chronic systolic (congestive) heart failure: Secondary | ICD-10-CM

## 2022-01-19 LAB — POCT GLYCOSYLATED HEMOGLOBIN (HGB A1C): Hemoglobin A1C: 11.1 % — AB (ref 4.0–5.6)

## 2022-01-19 NOTE — Patient Instructions (Signed)
Let me check on the cardiology appointment and update me about your sugar in about 2 weeks- back on your diet.  We'll go from there.   Take care.  Glad to see you.

## 2022-01-19 NOTE — Progress Notes (Signed)
He needs his cardiology referral sent to fax [phone].  Discussed.  Ordered.    Diabetes:  Using medications without difficulties: yes Hypoglycemic episodes: none below 100 Hyperglycemic episodes: occ 300 Feet problems: abnormal sensation at baseline.   Blood Sugars averaging: ~190 eye exam within last year: yes A1c d/w pt.  Diet d/w pt.    Prev skin tag came off previously.  Others are not bothersome at the neck.  He can update me as needed.  Meds, vitals, and allergies reviewed.  ROS: Per HPI unless specifically indicated in ROS section   GEN: nad, alert and oriented HEENT: ncat NECK: supple w/o LA CV: rrr. PULM: ctab, no inc wob ABD: soft, +bs EXT: no edema SKIN: no acute rash  Diabetic foot exam: Normal inspection except for R 1st ray amputation.   No skin breakdown No calluses  Normal DP pulses Normal sensation to light touch and but dec monofilament R foot.   Nails normal

## 2022-01-22 NOTE — Assessment & Plan Note (Signed)
I asked him to get back on his diet, continue [place] Trulicity metformin, and update me about his sugar in about 2 weeks.  I do not think we can make enough changes to his medications at this point without him being on his low carbohydrate diet to significantly influence his sugar.  Discussed.  He agrees.

## 2022-01-22 NOTE — Assessment & Plan Note (Signed)
See above.  Refer to cardiology.

## 2022-01-26 ENCOUNTER — Ambulatory Visit (INDEPENDENT_AMBULATORY_CARE_PROVIDER_SITE_OTHER): Payer: BC Managed Care – PPO

## 2022-01-26 ENCOUNTER — Ambulatory Visit: Payer: BC Managed Care – PPO | Admitting: Podiatry

## 2022-01-26 DIAGNOSIS — L97412 Non-pressure chronic ulcer of right heel and midfoot with fat layer exposed: Secondary | ICD-10-CM

## 2022-01-26 DIAGNOSIS — E1151 Type 2 diabetes mellitus with diabetic peripheral angiopathy without gangrene: Secondary | ICD-10-CM

## 2022-01-26 DIAGNOSIS — B351 Tinea unguium: Secondary | ICD-10-CM | POA: Diagnosis not present

## 2022-01-26 DIAGNOSIS — E11621 Type 2 diabetes mellitus with foot ulcer: Secondary | ICD-10-CM

## 2022-01-26 DIAGNOSIS — I739 Peripheral vascular disease, unspecified: Secondary | ICD-10-CM

## 2022-01-26 DIAGNOSIS — M79675 Pain in left toe(s): Secondary | ICD-10-CM | POA: Diagnosis not present

## 2022-01-26 DIAGNOSIS — M86671 Other chronic osteomyelitis, right ankle and foot: Secondary | ICD-10-CM

## 2022-01-26 DIAGNOSIS — M79674 Pain in right toe(s): Secondary | ICD-10-CM

## 2022-02-01 NOTE — Progress Notes (Signed)
Subjective: Chief Complaint  Patient presents with   Diabetes    Diabetic foot care, A1c- 11.0 BG- not taking, nail trim     56 year old male presents the office today for thick, elongated times he is unable to trim himself.  No swelling redness or drainage to the toenail sites.  Also he had chronic osteomyelitis of the right second toe has been still following with infectious disease for this.  He has not seen any open sores or increased wound redness or drainage.  No fevers or chills.  Objective: AAO x3, NAD DP/PT pulses palpable bilaterally, CRT less than 3 seconds Nails are hypertrophic, dystrophic, brittle, discolored, elongated 10. No surrounding redness or drainage. Tenderness nails 1-5 bilaterally except for the right hallux which has been amputated. No open lesions or pre-ulcerative lesions are identified today. Mild edema the tip of the second toe without any erythema or warmth or any skin breakdown on the right side.  There is no open lesions. No pain with calf compression, swelling, warmth, erythema  Assessment: Likely chronic osteomyelitis right second toe, symptomatic onychomycosis  Plan: -All treatment options discussed with the patient including all alternatives, risks, complications.  -X-rays obtained reviewed of the right foot.  Portion of the lateral view was cut off on the other 2 views no definitive cortical changes suggestive of osteomyelitis.  I also reviewed prior x-rays from September. -This is tenderness sed rate was 14, CRP 7.  Continue follow-up with infectious disease. -Sharply debridement nails x 9 without any complications or bleeding.  Trula Slade DPM

## 2022-02-10 DIAGNOSIS — N182 Chronic kidney disease, stage 2 (mild): Secondary | ICD-10-CM | POA: Insufficient documentation

## 2022-02-10 NOTE — Telephone Encounter (Signed)
FYI  Patient seen today by [facility]  02/10/2022 with Dr Michell Heinrich Alen Bleacher [facility] CARDIOLOGY AT [facility]

## 2022-02-23 ENCOUNTER — Telehealth: Payer: Self-pay

## 2022-02-23 NOTE — Telephone Encounter (Signed)
Transition Care Management Unsuccessful Follow-up Telephone Call  Date of discharge and from where:  [facility] emergency dept 02/22/2022  Attempts:  1st Attempt  Reason for unsuccessful TCM follow-up call:  Voice mail full Karena Roylee, LPN [facility] Nurse Health Advisor Direct Dial [phone]

## 2022-02-24 ENCOUNTER — Telehealth: Payer: Self-pay

## 2022-02-24 NOTE — Telephone Encounter (Signed)
Transition Care Management Follow-up Telephone Call Date of discharge and from where: [facility]  ED 02/22/2022 How have you been since you were released from the hospital? better Any questions or concerns? No  Items Reviewed: Did the pt receive and understand the discharge instructions provided? Yes  Medications obtained and verified? Yes  Other? Yes  Any new allergies since your discharge? No  Dietary orders reviewed? Yes Do you have support at home? Yes   Home Care and Equipment/Supplies: Were home health services ordered? no If so, what is the name of the agency? N/a  Has the agency set up a time to come to the patient's home? not applicable Were any new equipment or medical supplies ordered?  No What is the name of the medical supply agency? N/a Were you able to get the supplies/equipment? not applicable Do you have any questions related to the use of the equipment or supplies? No  Functional Questionnaire: (I = Independent and D = Dependent) ADLs: I  Bathing/Dressing- I  Meal Prep- I  Eating- I  Maintaining continence- I  Transferring/Ambulation- I  Managing Meds- I  Follow up appointments reviewed:  PCP Hospital f/u appt confirmed? No   Specialist Hospital f/u appt confirmed? Yes  Scheduled to see Dr Nedra Hai on 03/08/2022 @ 8:00. Are transportation arrangements needed? No  If their condition worsens, is the pt aware to call PCP or go to the Emergency Dept.? Yes Was the patient provided with contact information for the PCP's office or ED? Yes Was to pt encouraged to call back with questions or concerns? Yes Karena Lindwood, LPN [facility] Nurse Health Advisor Direct Dial [phone]

## 2022-02-26 NOTE — Telephone Encounter (Signed)
Noted.  Thanks.  Will await follow-up note.

## 2022-03-13 ENCOUNTER — Other Ambulatory Visit: Payer: Self-pay | Admitting: Family Medicine

## 2022-03-14 ENCOUNTER — Ambulatory Visit (INDEPENDENT_AMBULATORY_CARE_PROVIDER_SITE_OTHER): Payer: BC Managed Care – PPO | Admitting: Family Medicine

## 2022-03-14 ENCOUNTER — Encounter: Payer: Self-pay | Admitting: Family Medicine

## 2022-03-14 VITALS — BP 152/80 | HR 78 | Temp 97.3°F | Ht 69.0 in | Wt 349.0 lb

## 2022-03-14 DIAGNOSIS — E1169 Type 2 diabetes mellitus with other specified complication: Secondary | ICD-10-CM

## 2022-03-14 MED ORDER — ROSUVASTATIN CALCIUM 40 MG PO TABS: 40.0000 mg | ORAL_TABLET | Freq: Every day | ORAL | Status: AC

## 2022-03-14 MED ORDER — TRULICITY 3 MG/0.5ML ~~LOC~~ SOAJ: 3.0000 mg | SUBCUTANEOUS | 3 refills | Status: DC

## 2022-03-14 MED ORDER — CARVEDILOL 25 MG PO TABS: 25.0000 mg | ORAL_TABLET | Freq: Two times a day (BID) | ORAL | Status: DC

## 2022-03-14 MED ORDER — ROSUVASTATIN CALCIUM 10 MG PO TABS: 40.0000 mg | ORAL_TABLET | Freq: Every day | ORAL | Status: DC

## 2022-03-14 MED ORDER — DAPAGLIFLOZIN PROPANEDIOL 10 MG PO TABS: ORAL_TABLET | ORAL | Status: DC

## 2022-03-14 MED ORDER — FUROSEMIDE 40 MG PO TABS: 40.0000 mg | ORAL_TABLET | ORAL | Status: DC

## 2022-03-14 NOTE — Patient Instructions (Addendum)
Increase trulicity to 3mg  a week and let me know how that does- tolerance and your sugar readings.  Keep walking and keep working on your diet.  Recheck A1c at a visit after 04/21/22.    Think about the heart cath and if you want to see endocrine in the meantime.   Use the eat right diet in the meantime.

## 2022-03-14 NOTE — Progress Notes (Unsigned)
[facility] cards.  Cardiac monitor in place now.  He is considering cath per [facility].    DM2. Trulicity 1.5mg  weekly.  No ADE on med.  He has been walking more in the last 3 weeks.    ID at [facility].   He had trouble with getting midday hydralazine dose.    Meds, vitals, and allergies reviewed.   ROS: Per HPI unless specifically indicated in ROS section

## 2022-03-15 NOTE — Assessment & Plan Note (Signed)
Discussed options.  I do not expect improvement without continue work on diet and exercise. Increase trulicity to 3mg  a week and let me know how that goes re: tolerance and sugar readings.  Keep walking and keep working on diet.  Recheck A1c at a visit after 04/21/22.    I asked him to thank about the heart cath and if he wants to see endocrine in the meantime.  Defer insulin start at this point since he is going to increase his Trulicity.  Use the eat right diet in the meantime.

## 2022-03-20 ENCOUNTER — Other Ambulatory Visit: Payer: Self-pay | Admitting: Family Medicine

## 2022-03-21 ENCOUNTER — Other Ambulatory Visit (HOSPITAL_COMMUNITY): Payer: Self-pay

## 2022-03-21 NOTE — Telephone Encounter (Signed)
PA is needed on Hydralazine. Please start for patient.

## 2022-03-23 ENCOUNTER — Other Ambulatory Visit: Payer: Self-pay | Admitting: Family Medicine

## 2022-03-23 ENCOUNTER — Other Ambulatory Visit (HOSPITAL_COMMUNITY): Payer: Self-pay

## 2022-03-23 ENCOUNTER — Telehealth: Payer: Self-pay

## 2022-03-23 NOTE — Telephone Encounter (Signed)
Spoke to [place] at [facility] in [place] to rerun the hydralazine 25mg . I personally ran a test claim for a 30day and 90day supply, and the medication was covered for both.No PA Required.

## 2022-03-29 ENCOUNTER — Encounter: Payer: Self-pay | Admitting: Family Medicine

## 2022-03-29 ENCOUNTER — Other Ambulatory Visit: Payer: Self-pay | Admitting: Family Medicine

## 2022-03-30 ENCOUNTER — Other Ambulatory Visit (HOSPITAL_COMMUNITY): Payer: Self-pay

## 2022-03-30 NOTE — Telephone Encounter (Signed)
Per test claim, medication doesn't need PA

## 2022-03-30 NOTE — Telephone Encounter (Signed)
I have no idea what is going on with this medication for the patients. I see in the chart where this has been done twice and stated that it is cover yet [facility] keeps sending over that it is not. [facility] [place] and asked them to rerun rx and it still shows them that its a non formulary drug. Not sure what to do at this point.

## 2022-03-31 ENCOUNTER — Other Ambulatory Visit (HOSPITAL_COMMUNITY): Payer: Self-pay

## 2022-03-31 NOTE — Telephone Encounter (Signed)
[facility] in [place] and for some reason, it wasn't covering the [facility] that they were using. I had them switch it to [facility] [id number] and we received a paid claim. Pharmacy does have to order this specific NDC but it should be there Tuesday according to Rph

## 2022-04-06 ENCOUNTER — Other Ambulatory Visit: Payer: Self-pay | Admitting: Family Medicine

## 2022-04-18 ENCOUNTER — Telehealth: Payer: Self-pay | Admitting: Family Medicine

## 2022-04-18 NOTE — Telephone Encounter (Signed)
I realize this is a recurring issue but I would like your input here. Please see the MyChart message below about his Trulicity shortage.  Thanks.  =============================   Have you checked with any other pharmacies locally to see if they have the prescription available?  If no other pharmacies have the med available then let me know.  I am checking with staff here in the meantime to see about options.  Take care.  Shaw  ===View-only below this line===   ----- Message -----      From:Austin Shaw      Sent:04/15/2022  2:07 PM EST        TM:[id number] Medical Advice Request Message List   Subject:Trulicity  Hey Dr. Damita Dunnings,   I am doing well on the new dose and my glucose is staying well controlled. I have lost a few pounds and having the cath on Monday. I have finished all the 3 vials but can't get anymore due to a shortage. What is your suggestion?

## 2022-04-19 NOTE — Telephone Encounter (Signed)
Please see note from [facility] below and update patient.  Thanks.

## 2022-04-19 NOTE — Telephone Encounter (Signed)
The drug shortage database updated Trulicity status on 10/16/91 and they say all doses should be available now. It may take a couple weeks for pharmacies to get their stock back up, I agree with your recommendation to just try to find it at a different pharmacy in the meantime.

## 2022-04-20 ENCOUNTER — Other Ambulatory Visit: Payer: Self-pay

## 2022-04-20 ENCOUNTER — Other Ambulatory Visit (HOSPITAL_COMMUNITY): Payer: Self-pay

## 2022-04-20 MED ORDER — TRULICITY 3 MG/0.5ML ~~LOC~~ SOAJ
3.0000 mg | SUBCUTANEOUS | 3 refills | Status: DC
  Filled 2022-04-20: qty 2, 28d supply, fill #0
  Filled 2022-04-20: qty 4, 56d supply, fill #0
  Filled 2022-05-16: qty 2, 28d supply, fill #1
  Filled 2022-06-15: qty 2, 28d supply, fill #2
  Filled 2022-07-08: qty 2, 28d supply, fill #3
  Filled 2022-08-03: qty 2, 28d supply, fill #4
  Filled 2022-09-06 – 2022-09-18 (×3): qty 2, 28d supply, fill #5
  Filled 2022-10-13: qty 2, 28d supply, fill #6
  Filled 2022-11-10 – 2022-11-26 (×2): qty 2, 28d supply, fill #7

## 2022-04-20 NOTE — Telephone Encounter (Signed)
Spoke with patient and let him know what Austin Shaw had found. Patient states he and his wife called around to call pharmacies and no one had any in stock still. HE [facility] and [facility] and they can see in the system if any of there stores have any and they do not.

## 2022-04-21 NOTE — Telephone Encounter (Signed)
Prev sent to [facility].  Thanks.

## 2022-04-24 ENCOUNTER — Encounter: Payer: Self-pay | Admitting: Family Medicine

## 2022-04-24 ENCOUNTER — Ambulatory Visit: Payer: BC Managed Care – PPO | Admitting: Family Medicine

## 2022-04-24 ENCOUNTER — Ambulatory Visit: Payer: BC Managed Care – PPO | Admitting: Infectious Disease

## 2022-04-24 VITALS — BP 104/60 | HR 91 | Temp 97.9°F | Ht 69.0 in | Wt 338.0 lb

## 2022-04-24 DIAGNOSIS — E1169 Type 2 diabetes mellitus with other specified complication: Secondary | ICD-10-CM

## 2022-04-24 LAB — POCT GLYCOSYLATED HEMOGLOBIN (HGB A1C): Hemoglobin A1C: 8.9 % — AB (ref 4.0–5.6)

## 2022-04-24 NOTE — Progress Notes (Signed)
Diabetes:  Using medications without difficulties: yes Hypoglycemic episodes:no sx, lowest down to 84 Hyperglycemic episodes:no Feet problems: no ulceration.   Blood Sugars averaging: 152 average over the last 24 hours.   eye exam within last year: d/w pt.  See AVS.   Wilder Glade 10mg  Trulicity 3mg  weekly.   3 metformin a day.   A1c down to 8.9.   prev was 11.1.  [place] the mall.  Working on portions.  Ankles are not puffy.    Discussed with patient about recent heart cath.  CONCLUSIONS: - Mild coronary artery disease without apparent flow limiting lesions (multiple areas of irregularity up to 30%) - Mildly elevated LVEDP (15-20)  RECOMMENDATIONS: - Risk factor modification - Optimize medical Rx   PMH and SH reviewed  Meds, vitals, and allergies reviewed.   ROS: Per HPI unless specifically indicated in ROS section   GEN: nad, alert and oriented HEENT: ncat NECK: supple w/o LA CV: rrr. PULM: ctab, no inc wob ABD: soft, +bs EXT: no edema SKIN: Well-perfused

## 2022-04-24 NOTE — Patient Instructions (Addendum)
Please as the eye clinic to send me a copy of your next note.   Take care.  Glad to see you.  Recheck in about 3 months.  Labs at the visit.  Don't change your meds for now.   Thanks for your effort.

## 2022-04-26 NOTE — Assessment & Plan Note (Signed)
Fortunately his cardiac cath did not show obstructive disease.  Discussed with patient about risk factor modification.  Specifically improved diabetes control. Currently taking Farxiga 10mg  Trulicity 3mg  weekly.   3 metformin a day.   A1c down to 8.9.   prev was 11.1.  Walking at the mall.  Working on portions.  Ankles are not puffy.   Would continue meds above as is because he may have continued A1c improvement based on his most recent sugar readings at home.  Recheck periodically.  He agrees to plan.

## 2022-04-27 ENCOUNTER — Ambulatory Visit (INDEPENDENT_AMBULATORY_CARE_PROVIDER_SITE_OTHER): Payer: BC Managed Care – PPO

## 2022-04-27 ENCOUNTER — Ambulatory Visit: Payer: BC Managed Care – PPO | Admitting: Infectious Disease

## 2022-04-27 ENCOUNTER — Other Ambulatory Visit: Payer: Self-pay

## 2022-04-27 ENCOUNTER — Ambulatory Visit: Payer: BC Managed Care – PPO | Admitting: Podiatry

## 2022-04-27 DIAGNOSIS — Z9889 Other specified postprocedural states: Secondary | ICD-10-CM | POA: Diagnosis not present

## 2022-04-27 DIAGNOSIS — B351 Tinea unguium: Secondary | ICD-10-CM

## 2022-04-27 DIAGNOSIS — M79674 Pain in right toe(s): Secondary | ICD-10-CM

## 2022-04-27 DIAGNOSIS — M86671 Other chronic osteomyelitis, right ankle and foot: Secondary | ICD-10-CM | POA: Diagnosis not present

## 2022-04-27 DIAGNOSIS — M79675 Pain in left toe(s): Secondary | ICD-10-CM

## 2022-05-02 NOTE — Progress Notes (Signed)
Subjective: Chief Complaint  Patient presents with   Follow-up    3 month f/u Routine care, chronic osteomyelitis right 2nd toe    57 year old male presents the office today for thick, elongated times he is unable to trim himself.  No swelling redness or drainage to the toenail sites.  Also states that the right second toe has been doing well.  Is a small callus but no open lesions.  No drainage or pus or any increase swelling or redness.  States has been about the same.  No fever or chills.   Objective: AAO x3, NAD DP/PT pulses palpable bilaterally, CRT less than 3 seconds Nails are hypertrophic, dystrophic, brittle, discolored, elongated 10. No surrounding redness or drainage. Tenderness nails 1-5 bilaterally except for the right hallux which has been amputated. No open lesions or pre-ulcerative lesions are identified today. Mild edema the tip of the second toe without any erythema or warmth or any skin breakdown on the right side.  There is minimal callus this of the toe there is no underlying ulceration drainage or signs of infection.  There is no open lesions. No pain with calf compression, swelling, warmth, erythema  Assessment: Chronic osteomyelitis right second toe, symptomatic onychomycosis  Plan: -All treatment options discussed with the patient including all alternatives, risks, complications.  -X-rays obtained reviewed of the right foot.  3 views were obtained.  Likely chronic changes present on the distal phalanx but no acute cortical destruction suggestive of osteomyelitis at this time. -I debrided the hyperkeratotic lesion the tip of the second toe any complications or bleeding.  I do think that this is likely a chronic osteomyelitis on the right 2nd toe, and is stable. Continue to monitor. Follow up with ID as well.  -Sharply debridement nails x 9 without any complications or bleeding.  Trula Slade DPM

## 2022-05-16 ENCOUNTER — Other Ambulatory Visit: Payer: Self-pay

## 2022-06-11 ENCOUNTER — Other Ambulatory Visit: Payer: Self-pay | Admitting: Cardiovascular Disease

## 2022-06-12 IMAGING — MR MR FOOT*R* W/O CM
5 of 6 series · 34 of 40 positions shown · non-contrast
Comparison: Radiographs dated July 21, 2021

CLINICAL DATA: Foot swelling, diabetic.  Osteomyelitis suspected.

EXAM:
MRI OF THE RIGHT FOREFOOT WITHOUT CONTRAST
TECHNIQUE: Multiplanar, multisequence MR imaging of the right was performed. No
intravenous contrast was administered.

[Series 6: T1 · coronal · right · 3.0mm · 0.34mm/px · 8 of 42 slices shown (1 of 2)]
[im 1/42]
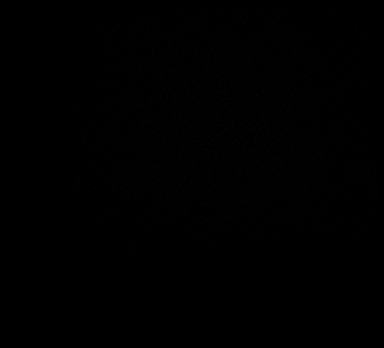
[im 6/42]
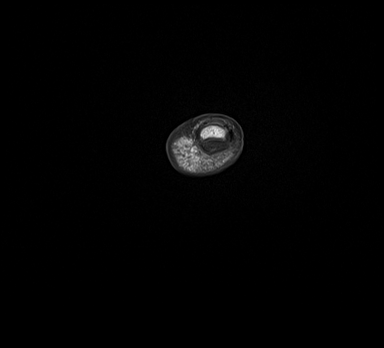
[im 12/42]
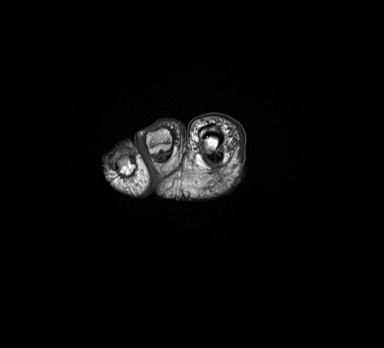
[im 18/42]
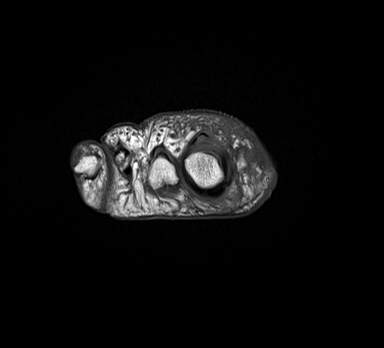
[im 24/42]
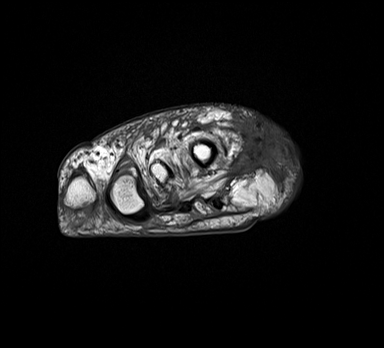
[im 30/42]
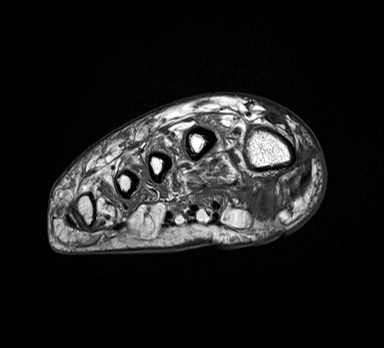
[im 36/42]
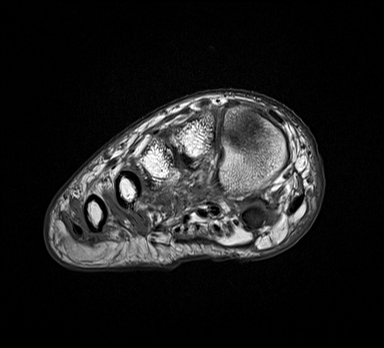
[im 42/42]
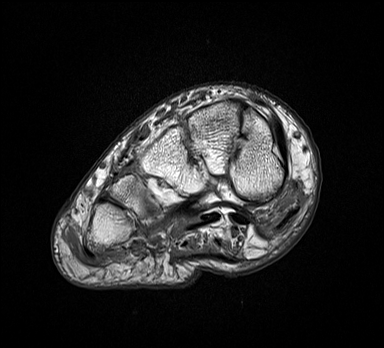

[Series 7: T2 fat-sat · coronal · right · 3.0mm · 0.34mm/px · 8 of 42 slices shown (1 of 2)]
[im 1/42]
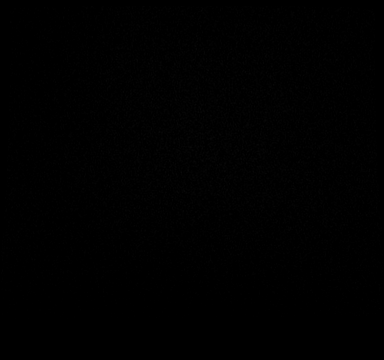
[im 6/42]
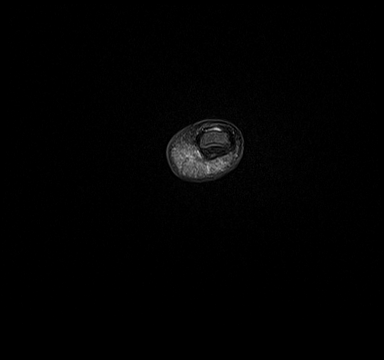
[im 12/42]
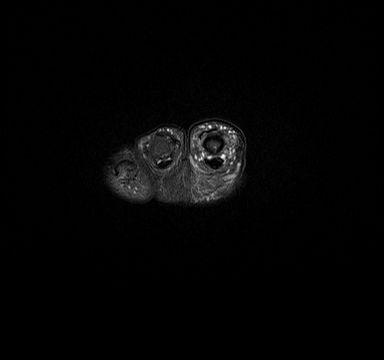
[im 18/42]
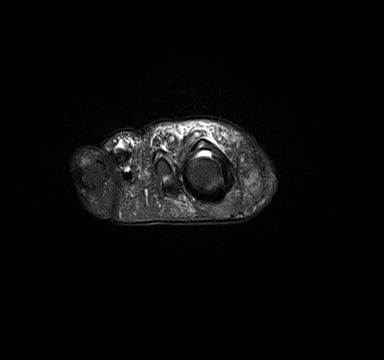
[im 24/42]
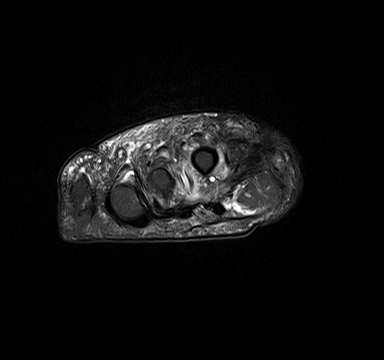
[im 30/42]
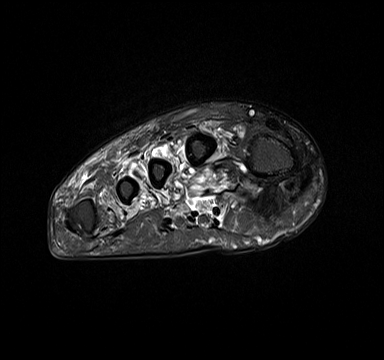
[im 36/42]
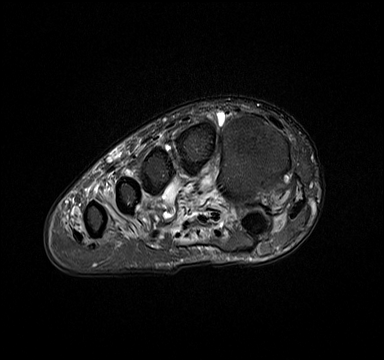
[im 42/42]
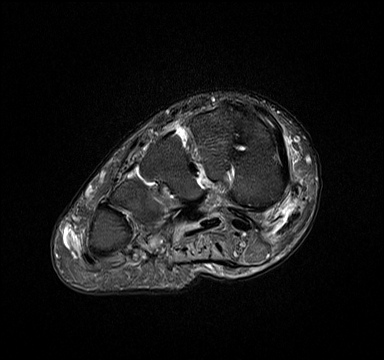

[Series 9: T1 · axial · right · 3.0mm · 0.52mm/px · z∈[-189,-100]mm · 5 of 26 slices shown (2 of 2)]
[im 1/26]
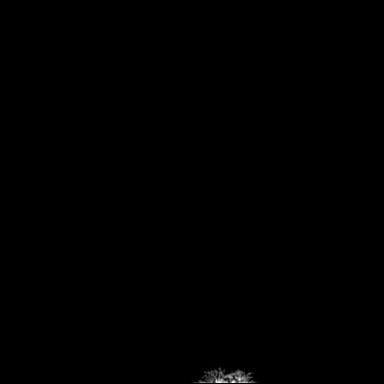
[im 7/26]
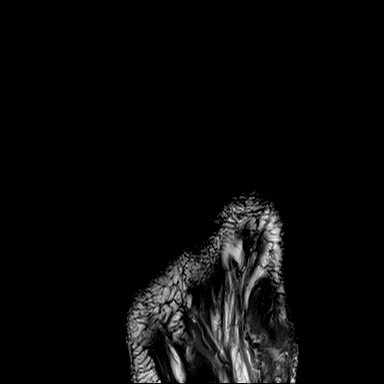
[im 13/26]
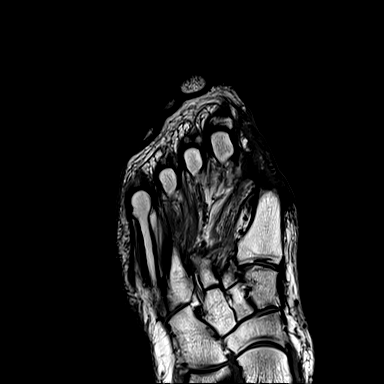
[im 19/26]
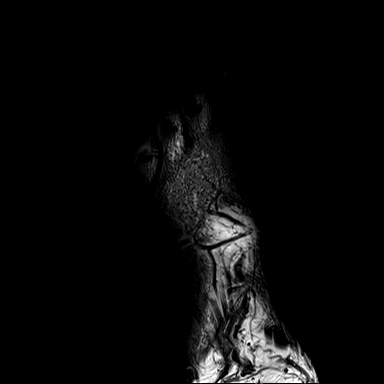
[im 26/26]
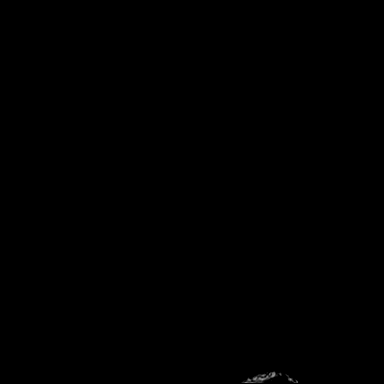

[Series 10: T1 fat-sat · coronal · right · 3.0mm · 0.29mm/px · 8 of 42 slices shown]
[im 1/42]
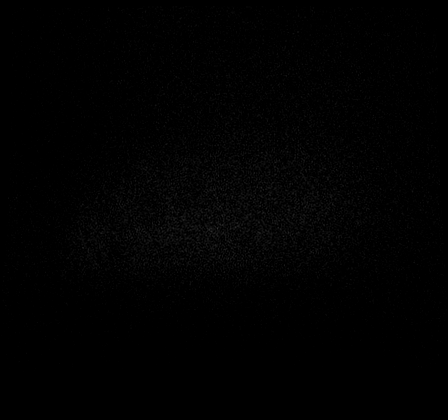
[im 6/42]
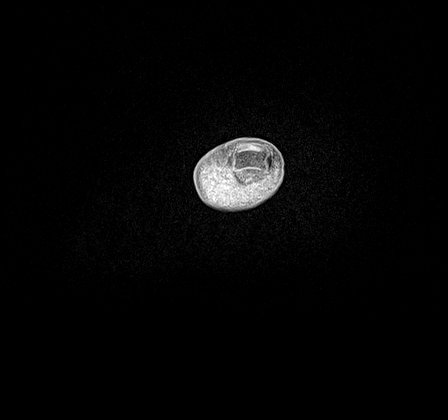
[im 12/42]
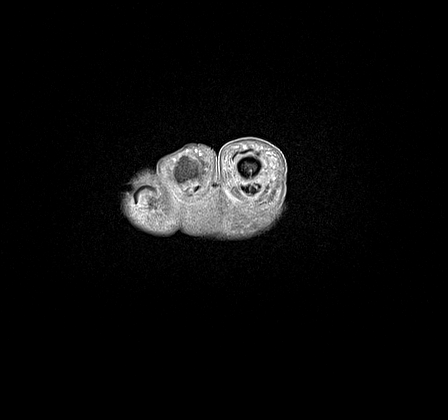
[im 18/42]
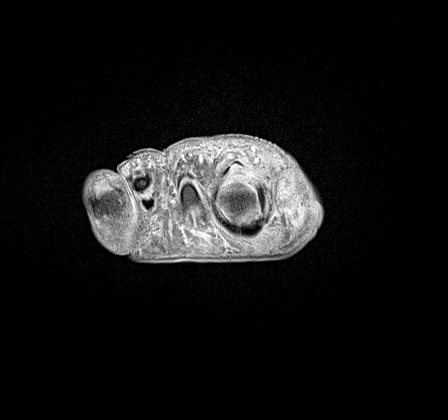
[im 24/42]
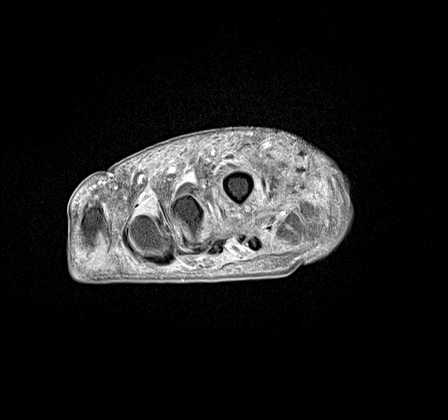
[im 30/42]
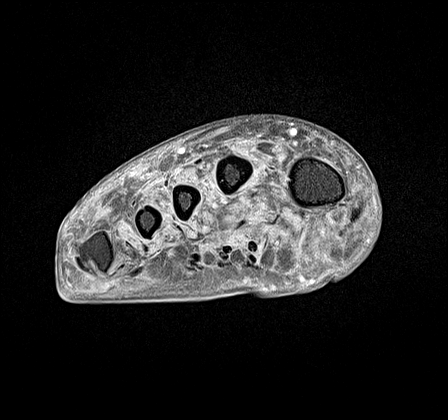
[im 36/42]
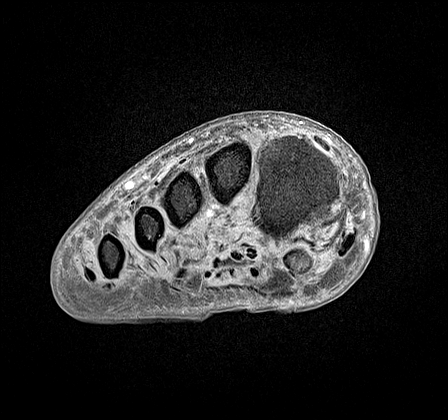
[im 42/42]
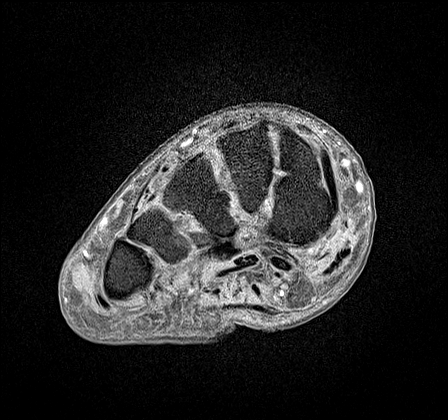

[Series 11: T2 fat-sat · axial · right · 3.0mm · 0.52mm/px · z∈[-189,-100]mm · 5 of 26 slices shown (2 of 2)]
[im 1/26]
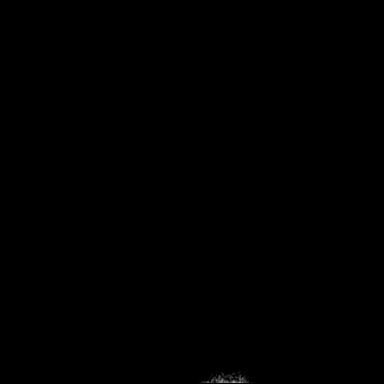
[im 7/26]
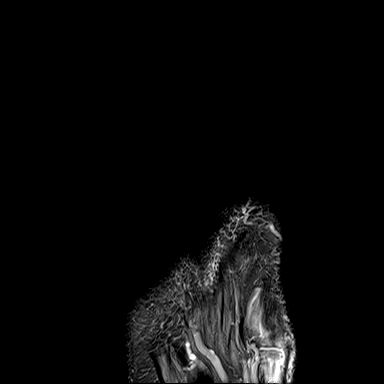
[im 13/26]
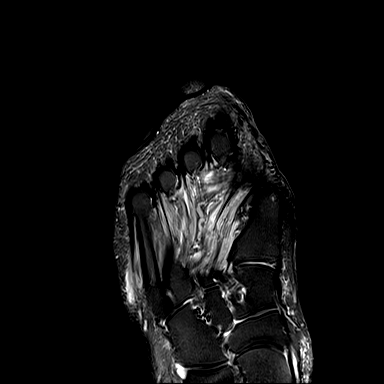
[im 19/26]
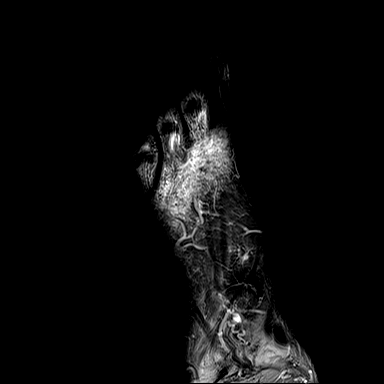
[im 26/26]
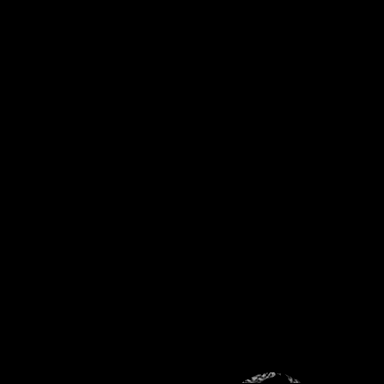

[34 of 40 positions shown; findings below may reference images not displayed]

FINDINGS: Bones/Joint/Cartilage

Postsurgical changes for prior first ray amputation through the mid
metatarsal shaft. No fracture or dislocation. Normal alignment. No
joint effusion. No marrow signal abnormality.

Ligaments

Collateral ligaments are intact.  Lisfranc ligament is intact.

Muscles and Tendons

Flexor, peroneal and extensor compartment tendons are intact.
Increase intramuscular signal concerning for diabetic
myopathy/myositis. No fluid collection or abscess.

Soft tissue
No soft tissue mass. Mild subcutaneous soft tissue edema prominent
about the dorsum of the foot. No fluid collection or abscess.
IMPRESSION: 1. Postsurgical changes for prior amputation of the first ray
through the mid metatarsal shaft.

2.  No MR evidence of acute osteomyelitis.

3. Generalized subcutaneous soft tissue edema prominent about dorsum
of the foot. No fluid collection or abscess.

## 2022-06-15 ENCOUNTER — Other Ambulatory Visit: Payer: Self-pay

## 2022-06-18 ENCOUNTER — Encounter: Payer: Self-pay | Admitting: Family Medicine

## 2022-06-18 ENCOUNTER — Other Ambulatory Visit: Payer: Self-pay | Admitting: Family Medicine

## 2022-06-19 MED ORDER — FREESTYLE LIBRE 14 DAY SENSOR MISC: 12 refills | Status: DC

## 2022-07-02 NOTE — Progress Notes (Unsigned)
Subjective:    Chief complaint: Follow-up for diabetic foot infection and osteomyelitis    Patient ID: Austin Shaw, male    DOB: 03/09/1966, 57 y.o.   MRN: [id number]  HPI  57 y.o. male with these mellitus peripheral vascular disease status post revascularization with gangrenous toe with osteomyelitis status post I&D of bursal collection and amputation of first toe then repeat trip to the OR for debridement and resection with cultures from both surgeries yielding group B streptococcus and E. coli that was fairly sensitive.  We  placed him on IV cefazolin along with oral metronidazole.   He had been taking both medications religiously including the metronidazole which she tries to take as quickly as possible to avoid the unpleasant taste gives and ones mouth.   I saw him last on May 19 that was encouraged by the appearance of his wound but discouraged that his inflammatory markers had increased.   We switched him from IV ceftriaxone and oral metronidazole to Augmentin.  Has been taking that since then.  Unfortunately his wound required further debridement and he underwent debridement irrigation of his right foot wound on the third June by Dr. March Rummage but with placement of skin graft over the wound apparently there have been concerned that there will be need for bone biopsy but this was not performed.  Then he had apparent failure of the graft and need for further debridement and placement of a new skin graft.  The last saw him he has had several debridements but his wound is healing up quite nicely with just 1 residual open area.   His last visit I was going to stop his antibiotics was inflammatory markers in particular his sed rate had markedly elevated.  He continued on Augmentin since then and been following with Dr. March Rummage.  She had some debridement of callus when seen by Dr. March Rummage in 29 April 2021.  I saw him in late January and had intended to stop antibiotics his  inflammatory markers were trending in the right direction but he is continued on them.  Wound continues to look quite stable.  He is occasional pain off and on but overall trajectory was  encouraging.  When I last saw him in April the inflammatory markers were up again.  We ordered an MRI of the foot performed on Aug 28, 2021.  Bone was read as showing largely what radiology felt were postsurgical changes from prior first ray amputations through to the mid metatarsal shaft.  There is no marrow signal abnormality   I do not find MRI evidence of new osteomyelitis.  He has been seen by Dr. Jacqualyn Posey and he has proposed idea of affecting the bone in the area that is thought to reflect postoperative changes.   Lisa is not interested in that approach at this time.  He had experienced no increased pain he has no systemic symptoms of infection and his wound looks well-healed.  We had to observe him off antibiotics and he is doing well clinically with no increased pain or erythema or systemic evidence of infection we will recheck inflammatory markers and he also asked TIVICAY to recheck an x-ray today and then follow-up in 4 months time.       Past Medical History:  Diagnosis Date   Acute osteomyelitis of toe ([place]) 08/26/2020   Allergy    CHF (congestive heart failure) (HCC)    Diabetes mellitus    type 2   E coli infection 09/20/2020  Group B streptococcal infection 09/20/2020   Hyperlipidemia    Hypertension    Hypertension    Morbid obesity ([place])    BMI 50   Peripheral vascular disease (HCC)    Sleep apnea    pt states he no longer has sleep apnea since tonsillectomy    Past Surgical History:  Procedure Laterality Date   ABDOMINAL AORTOGRAM W/LOWER EXTREMITY N/A 07/14/2020   Procedure: ABDOMINAL AORTOGRAM W/LOWER EXTREMITY;  Surgeon: Wellington Hampshire, MD;  Location: [facility];  Service: Cardiovascular;  Laterality: N/A;   AMPUTATION Right 07/21/2020   Procedure:  AMPUTATION RIGHT GREAT TOE;  Surgeon: Evelina Bucy, DPM;  Location: [facility];  Service: Podiatry;  Laterality: Right;   GRAFT APPLICATION Right [id number]   Procedure: GRAFT APPLICATION;  Surgeon: Evelina Bucy, DPM;  Location: [place];  Service: Podiatry;  Laterality: Right;   IRRIGATION AND DEBRIDEMENT FOOT Right 07/21/2020   Procedure: IRRIGATION AND DEBRIDEMENT RIGHT GREAT TOE;  Surgeon: Evelina Bucy, DPM;  Location: [facility];  Service: Podiatry;  Laterality: Right;   METATARSAL HEAD EXCISION Right 07/23/2020   Procedure: METATARSAL HEAD EXCISION;  Surgeon: Evelina Bucy, DPM;  Location: [facility];  Service: Podiatry;  Laterality: Right;   TONSILLECTOMY     WOUND DEBRIDEMENT Right 07/23/2020   Procedure: DEBRIDEMENT WOUND WITH DELAYED WOUND CLOSURE;  Surgeon: Evelina Bucy, DPM;  Location: [facility];  Service: Podiatry;  Laterality: Right;   WOUND DEBRIDEMENT Right 09/17/2020   Procedure: DEBRIDEMENT OF RIGHT FOOT WOUND, PARTIAL WOUND CLOSURE, APPLICATION OF SKIN GRAFT SUBSTITUTE;  Surgeon: Evelina Bucy, DPM;  Location: [place];  Service: Podiatry;  Laterality: Right;   WOUND DEBRIDEMENT Right 10/15/2020   Procedure: DEBRIDEMENT WOUND with Application of skin graft substitute;  Surgeon: Evelina Bucy, DPM;  Location: [facility];  Service: Podiatry;  Laterality: Right;   WOUND DEBRIDEMENT Right 11/19/2020   Procedure: DEBRIDEMENT WOUND;  Surgeon: Evelina Bucy, DPM;  Location: [facility];  Service: Podiatry;  Laterality: Right;    Family History  Problem Relation Age of Onset   Diabetes Father    Pancreatic cancer Father    Lupus Daughter    Sjogren's syndrome Daughter    Diabetes Other        fam hx 1st degree relative   Congestive Heart Failure Other    Colon cancer Neg Hx    Prostate cancer Neg Hx    Esophageal cancer Neg Hx    Rectal cancer Neg Hx       Social History   Socioeconomic History   Marital status: Married    Spouse name: Not on file   Number of children: Not on file    Years of education: Not on file   Highest education level: Not on file  Occupational History   Occupation: Passenger transport manager    Employer: [employer]  Tobacco Use   Smoking status: Never   Smokeless tobacco: Never  Vaping Use   Vaping Use: Never used  Substance and Sexual Activity   Alcohol use: No    Alcohol/week: 0.0 standard drinks of alcohol   Drug use: No   Sexual activity: Not on file  Other Topics Concern   Not on file  Social History Narrative   Working at [employer].    4 kids   Married, 1998   From [place]   Social Determinants of Health   Financial Resource Strain: Not on [employer] Insecurity: Not on file  Transportation Needs: Not on file  Physical Activity: Not on file  Stress: Not on file  Social Connections: Not on file    Allergies  Allergen Reactions   Lipitor [Atorvastatin] Other (See Comments)    Lightheaded & dizzy     Current Outpatient Medications:    carvedilol (COREG) 25 MG tablet, Take 1 tablet (25 mg total) by mouth 2 (two) times daily with a meal., Disp: , Rfl:    clopidogrel (PLAVIX) 75 MG tablet, Take 1 tablet (75 mg total) by mouth daily., Disp: 90 tablet, Rfl: 2   Continuous Blood Gluc Sensor (FREESTYLE LIBRE 14 DAY SENSOR) MISC, Use to check blood sugars daily. Dx: E11.9, Disp: 1 each, Rfl: 12   dapagliflozin propanediol (FARXIGA) 10 MG TABS tablet, 1 tab daily., Disp: 30 tablet, Rfl:    Dulaglutide (TRULICITY) 3 0000000 SOPN, Inject 3 mg under the skin  as directed once a week., Disp: 4 mL, Rfl: 3   furosemide (LASIX) 40 MG tablet, TAKE 1/2 TABLET BY MOUTH DAILY AS NEEDED FOR FLUID OR EDEMA, Disp: 45 tablet, Rfl: 1   glucose blood test strip, CHECK BLOOD SUGAR TWICE DAILY, Disp: 100 each, Rfl: 3   hydrALAZINE (APRESOLINE) 25 MG tablet, TAKE 1 TABLET BY MOUTH THREE TIMES A DAY, Disp: 270 tablet, Rfl: 1   metFORMIN (GLUCOPHAGE) 500 MG tablet, TAKE 1 TABLET BY MOUTH TWICE A DAY THEN INCREASE TO 2 TABS IN MORNING AND 1 TAB  EVENING, Disp: 270 tablet, Rfl: 3   ondansetron (ZOFRAN-ODT) 4 MG disintegrating tablet, Take 1 tablet (4 mg total) by mouth every 8 (eight) hours as needed for nausea or vomiting., Disp: 5 tablet, Rfl: 0   rosuvastatin (CRESTOR) 40 MG tablet, Take 1 tablet (40 mg total) by mouth daily., Disp: , Rfl:    sacubitril-valsartan (ENTRESTO) 24-26 MG, Take 1 tablet by mouth 2 (two) times daily., Disp: , Rfl:    spironolactone (ALDACTONE) 25 MG tablet, Take 1 tablet by mouth daily., Disp: , Rfl:    terbinafine (LAMISIL) 250 MG tablet, TAKE 1 TABLET BY MOUTH EVERY DAY, Disp: 30 tablet, Rfl: 3   tobramycin (TOBREX) 0.3 % ophthalmic solution, Place 1 drop into both eyes See admin instructions. 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  We will check repeat sed rate CRP BMP and CBC with differential  I will recheck plain films of the foot.  Unless there is something disconcerting about plain films or labs we will plan on seeing him back in 4 months time for follow-up.  He was following with Dr. March Rummage who now is no longer in the podiatry practice but does need follow-up with podiatry.   I spent 32  minutes with the patient including than 50% of the time in face to face counseling of the  patient is diabetic foot foot infection, osteomyelitisalong with review of medical records in preparation for  the visit and during the visit and in coordination of his care.

## 2022-07-03 ENCOUNTER — Other Ambulatory Visit: Payer: Self-pay

## 2022-07-03 ENCOUNTER — Encounter: Payer: Self-pay | Admitting: Infectious Disease

## 2022-07-03 ENCOUNTER — Ambulatory Visit: Payer: BC Managed Care – PPO | Admitting: Infectious Disease

## 2022-07-03 VITALS — BP 170/98 | HR 88 | Temp 97.7°F | Ht 70.0 in | Wt 336.0 lb

## 2022-07-03 DIAGNOSIS — E1169 Type 2 diabetes mellitus with other specified complication: Secondary | ICD-10-CM | POA: Diagnosis not present

## 2022-07-03 DIAGNOSIS — I739 Peripheral vascular disease, unspecified: Secondary | ICD-10-CM

## 2022-07-03 DIAGNOSIS — A491 Streptococcal infection, unspecified site: Secondary | ICD-10-CM | POA: Diagnosis not present

## 2022-07-03 DIAGNOSIS — L97513 Non-pressure chronic ulcer of other part of right foot with necrosis of muscle: Secondary | ICD-10-CM

## 2022-07-03 DIAGNOSIS — B351 Tinea unguium: Secondary | ICD-10-CM

## 2022-07-03 DIAGNOSIS — M86179 Other acute osteomyelitis, unspecified ankle and foot: Secondary | ICD-10-CM

## 2022-07-04 ENCOUNTER — Telehealth: Payer: Self-pay

## 2022-07-04 LAB — BASIC METABOLIC PANEL WITH GFR
BUN/Creatinine Ratio: 17 (calc) (ref 6–22)
BUN: 23 mg/dL (ref 7–25)
CO2: 28 mmol/L (ref 20–32)
Calcium: 9.6 mg/dL (ref 8.6–10.3)
Chloride: 101 mmol/L (ref 98–110)
Creat: 1.39 mg/dL — ABNORMAL HIGH (ref 0.70–1.30)
Glucose, Bld: 127 mg/dL — ABNORMAL HIGH (ref 65–99)
Potassium: 4.4 mmol/L (ref 3.5–5.3)
Sodium: 138 mmol/L (ref 135–146)
eGFR: 60 mL/min/{1.73_m2} (ref >=60)

## 2022-07-04 LAB — CBC WITH DIFFERENTIAL/PLATELET
Absolute Monocytes: 502 cells/uL (ref 200–950)
Basophils Absolute: 73 cells/uL (ref 0–200)
Basophils Relative: 0.9 %
Eosinophils Absolute: 227 cells/uL (ref 15–500)
Eosinophils Relative: 2.8 %
HCT: 40.8 % (ref 38.5–50.0)
Hemoglobin: 13.3 g/dL (ref 13.2–17.1)
Lymphs Abs: 2730 cells/uL (ref 850–3900)
MCH: 29.4 pg (ref 27.0–33.0)
MCHC: 32.6 g/dL (ref 32.0–36.0)
MCV: 90.1 fL (ref 80.0–100.0)
MPV: 11.5 fL (ref 7.5–12.5)
Monocytes Relative: 6.2 %
Neutro Abs: 4568 cells/uL (ref 1500–7800)
Neutrophils Relative %: 56.4 %
Platelets: 255 10*3/uL (ref 140–400)
RBC: 4.53 10*6/uL (ref 4.20–5.80)
RDW: 13.9 % (ref 11.0–15.0)
Total Lymphocyte: 33.7 %
WBC: 8.1 10*3/uL (ref 3.8–10.8)

## 2022-07-04 LAB — C-REACTIVE PROTEIN: CRP: 4.5 mg/L (ref <8.0)

## 2022-07-04 LAB — SEDIMENTATION RATE: Sed Rate: 60 mm/h — ABNORMAL HIGH (ref 0–20)

## 2022-07-04 NOTE — Telephone Encounter (Signed)
Patient advised of lab results and scheduled for an appointment in a month.  [place] Ferol Laiche, CMA

## 2022-07-04 NOTE — Telephone Encounter (Signed)
-----   Message from Truman Hayward, MD sent at 07/04/2022  1:18 PM EDT ----- His inflammatory markers are up again. Why dont we have him stay off of antibioticcs but see me soooner, like next 1-2 months ----- Message ----- From: Interface, [facility] Lab Results In Sent: 07/03/2022  10:52 PM EDT To: Truman Hayward, MD

## 2022-07-11 ENCOUNTER — Other Ambulatory Visit: Payer: Self-pay | Admitting: Cardiovascular Disease

## 2022-07-24 ENCOUNTER — Ambulatory Visit: Payer: BC Managed Care – PPO | Admitting: Family Medicine

## 2022-07-24 ENCOUNTER — Encounter: Payer: Self-pay | Admitting: Family Medicine

## 2022-07-24 VITALS — BP 112/68 | HR 92 | Temp 98.0°F | Ht 70.0 in | Wt 329.0 lb

## 2022-07-24 DIAGNOSIS — E1169 Type 2 diabetes mellitus with other specified complication: Secondary | ICD-10-CM | POA: Diagnosis not present

## 2022-07-24 MED ORDER — METFORMIN HCL 500 MG PO TABS: ORAL_TABLET | ORAL | Status: DC

## 2022-07-24 MED ORDER — FUROSEMIDE 40 MG PO TABS: 40.0000 mg | ORAL_TABLET | ORAL | Status: DC

## 2022-07-24 NOTE — Progress Notes (Signed)
Diabetes:  Using medications without difficulties:yes Hypoglycemic episodes: no Hyperglycemic episodes: no Feet problems: no new issues.  Blood Sugars averaging: 145-150, until his meter app wasn't working.  He got his meter working again.   He had dental work addressed in the meantime.  Labs pending.   Vomited today, 1 PM, then felt better.  Isolated event.  Noted recent GI illness in the community. He is taking lasix QOD.    Prev back pain on the R side resolved.  No pain now.    Meds, vitals, and allergies reviewed.  ROS: Per HPI unless specifically indicated in ROS section   GEN: nad, alert and oriented HEENT: mucous membranes moist NECK: supple w/o LA CV: rrr. PULM: ctab, no inc wob ABD: soft, +bs EXT: no edema SKIN: no acute rash  Diabetic foot exam: Normal inspection except for R 1st ray amputation.  Well-healed. No skin breakdown Small callus R 2nd toe distally.  No ulceration.  Footcare cautions discussed with patient. Normal DP pulses dec sensation to light touch and monofilament Nails normal

## 2022-07-24 NOTE — Assessment & Plan Note (Signed)
Blood Sugars averaging: 145-150 He had dental work addressed in the meantime.  Labs pending.  See notes on labs. Vomited today, 1 PM, then felt better.  Isolated event.  Noted recent GI illness in the community. Benign abdominal exam. Footcare discussed with patient. Plan on recheck in about 3 months.  Labs at the visit.  No change at any medications but if he has any more vomiting then we need to consider changing Trulicity.  Continue Trulicity Farxiga and metformin as is for now.  He agrees to plan.

## 2022-07-24 NOTE — Patient Instructions (Addendum)
Go to the lab on the way out.   If you have mychart we'll likely use that to update you.    Take care.  Glad to see you. Plan on recheck in about 3 months.  Labs at the visit.

## 2022-07-25 LAB — BASIC METABOLIC PANEL
BUN: 30 mg/dL — ABNORMAL HIGH (ref 6–23)
CO2: 28 mEq/L (ref 19–32)
Calcium: 9.8 mg/dL (ref 8.4–10.5)
Chloride: 98 mEq/L (ref 96–112)
Creatinine, Ser: 2.01 mg/dL — ABNORMAL HIGH (ref 0.40–1.50)
GFR: 36.74 mL/min — ABNORMAL LOW (ref >60.00)
Glucose, Bld: 189 mg/dL — ABNORMAL HIGH (ref 70–99)
Potassium: 4.9 mEq/L (ref 3.5–5.1)
Sodium: 137 mEq/L (ref 135–145)

## 2022-07-25 LAB — LIPID PANEL
Cholesterol: 131 mg/dL (ref 0–200)
HDL: 46.1 mg/dL (ref >39.00)
LDL Cholesterol: 56 mg/dL (ref 0–99)
NonHDL: 84.43
Total CHOL/HDL Ratio: 3
Triglycerides: 142 mg/dL (ref 0.0–149.0)
VLDL: 28.4 mg/dL (ref 0.0–40.0)

## 2022-07-25 LAB — HEMOGLOBIN A1C: Hgb A1c MFr Bld: 8.6 % — ABNORMAL HIGH (ref 4.6–6.5)

## 2022-07-26 ENCOUNTER — Other Ambulatory Visit: Payer: Self-pay | Admitting: Family Medicine

## 2022-07-26 DIAGNOSIS — I10 Essential (primary) hypertension: Secondary | ICD-10-CM

## 2022-07-27 ENCOUNTER — Other Ambulatory Visit: Payer: Self-pay | Admitting: Family Medicine

## 2022-07-27 DIAGNOSIS — E1169 Type 2 diabetes mellitus with other specified complication: Secondary | ICD-10-CM

## 2022-07-31 ENCOUNTER — Ambulatory Visit (INDEPENDENT_AMBULATORY_CARE_PROVIDER_SITE_OTHER): Payer: BC Managed Care – PPO

## 2022-07-31 ENCOUNTER — Ambulatory Visit (INDEPENDENT_AMBULATORY_CARE_PROVIDER_SITE_OTHER): Payer: BC Managed Care – PPO | Admitting: Podiatry

## 2022-07-31 DIAGNOSIS — M79674 Pain in right toe(s): Secondary | ICD-10-CM | POA: Diagnosis not present

## 2022-07-31 DIAGNOSIS — B351 Tinea unguium: Secondary | ICD-10-CM | POA: Diagnosis not present

## 2022-07-31 DIAGNOSIS — M86671 Other chronic osteomyelitis, right ankle and foot: Secondary | ICD-10-CM

## 2022-07-31 DIAGNOSIS — L84 Corns and callosities: Secondary | ICD-10-CM

## 2022-07-31 DIAGNOSIS — M79675 Pain in left toe(s): Secondary | ICD-10-CM

## 2022-07-31 DIAGNOSIS — E1151 Type 2 diabetes mellitus with diabetic peripheral angiopathy without gangrene: Secondary | ICD-10-CM | POA: Diagnosis not present

## 2022-08-02 NOTE — Progress Notes (Signed)
Subjective: Chief Complaint  Patient presents with   Nail Problem    Thick painful toenails, 3 month follow up    57 year old male presents the office today for thick, elongated times he is unable to trim himself.  No swelling redness or drainage to the toenail sites.  Also states that the right second toe has been doing well.  He has not seen any open lesions.  No swelling redness or drainage.  No ulcerations.  No fevers or chills.   Objective: AAO x3, NAD DP/PT pulses palpable bilaterally, CRT less than 3 seconds Nails are hypertrophic, dystrophic, brittle, discolored, elongated 10. No surrounding redness or drainage. Tenderness nails 1-5 bilaterally except for the right hallux which has been amputated. No open lesions or pre-ulcerative lesions are identified today. Mild hyperkeratotic tissue the distal aspect of the right second toe without any underlying ulceration drainage or signs of infection.  There is no erythema, warmth or any fluctuation or crepitation.  No signs of infection today. No pain with calf compression, swelling, warmth, erythema  Assessment: Chronic osteomyelitis right second toe, symptomatic onychomycosis  Plan: -All treatment options discussed with the patient including all alternatives, risks, complications.  -X-rays obtained reviewed of the right foot.  3 views were obtained.  Chronic changes present on the distal phalanx but no acute cortical destruction suggestive of osteomyelitis at this time. -No obvious signs of infection but needs to monitor closely.  He has follow-up with infectious disease as well. -I debrided the hyperkeratotic lesion the tip of the second toe any complications or bleeding.  -Sharply debridement nails x 9 without any complications or bleeding. -Daily foot inspection.  Vivi Barrack DPM

## 2022-08-04 ENCOUNTER — Other Ambulatory Visit: Payer: BC Managed Care – PPO

## 2022-08-07 ENCOUNTER — Ambulatory Visit: Payer: BC Managed Care – PPO | Admitting: Infectious Disease

## 2022-08-07 NOTE — Progress Notes (Deleted)
Subjective:    Chief complaint: For diabetic foot infection and osteomyelitis   Patient ID: Austin Shaw, male    DOB: [birth year], 57 y.o.   MRN: [id number]  HPI  57 y.o. male with these mellitus peripheral vascular disease status post revascularization with gangrenous toe with osteomyelitis status post I&D of bursal collection and amputation of first toe then repeat trip to the OR for debridement and resection with cultures from both surgeries yielding group B streptococcus and E. coli that was fairly sensitive.  We  placed him on IV cefazolin along with oral metronidazole.   He had been taking both medications religiously including the metronidazole which she tries to take as quickly as possible to avoid the unpleasant taste gives and ones mouth.   I saw him last on May 19 that was encouraged by the appearance of his wound but discouraged that his inflammatory markers had increased.   We switched him from IV ceftriaxone and oral metronidazole to Augmentin.  Has been taking that since then.  Unfortunately his wound required further debridement and he underwent debridement irrigation of his right foot wound on the third June by Dr. Samuella Cota but with placement of skin graft over the wound apparently there have been concerned that there will be need for bone biopsy but this was not performed.  Then he had apparent failure of the graft and need for further debridement and placement of a new skin graft.  He had has had several debridements but his wound is healing up quite nicely with just 1 residual open area.   His last visit I was going to stop his antibiotics was inflammatory markers in particular his sed rate had markedly elevated.  He continued on Augmentin since then and been following with Dr. Samuella Cota.  She had some debridement of callus when seen by Dr. Samuella Cota in 29 April 2021.  I saw him in late January and had intended to stop antibiotics his inflammatory markers were  trending in the right direction but he is continued on them.  Wound continues to look quite stable.  He is occasional pain off and on but overall trajectory was  encouraging.  When I last saw him in April 2023 the inflammatory markers were up again.  We ordered an MRI of the foot performed on Aug 28, 2021.  Bone was read as showing largely what radiology felt were postsurgical changes from prior first ray amputations through to the mid metatarsal shaft.  There is no marrow signal abnormality   I do not find MRI evidence of new osteomyelitis.  He was  been seen by Dr. Ardelle Anton and he has proposed idea of resecting the bone in the area that is thought to reflect postoperative changes.   Deklin is not interested in that approach at this time.  He had experienced no increased pain he has no systemic symptoms of infection and his wound looks well-healed.   When I last saw him we did take him off antibiotics and he has now been off antibiotics for 6 months without any evidence of recurrence.  He was on Lamisil systemically for onychomycosis.       Past Medical History:  Diagnosis Date   Acute osteomyelitis of toe (HCC) 08/26/2020   Allergy    CHF (congestive heart failure) (HCC)    Diabetes mellitus    type 2   E coli infection 09/20/2020   Group B streptococcal infection 09/20/2020   Hyperlipidemia    Hypertension  Hypertension    Morbid obesity (HCC)    BMI 50   Peripheral vascular disease (HCC)    Sleep apnea    pt states he no longer has sleep apnea since tonsillectomy    Past Surgical History:  Procedure Laterality Date   ABDOMINAL AORTOGRAM W/LOWER EXTREMITY N/A 07/14/2020   Procedure: ABDOMINAL AORTOGRAM W/LOWER EXTREMITY;  Surgeon: Iran Ouch, MD;  Location: [facility];  Service: Cardiovascular;  Laterality: N/A;   AMPUTATION Right 07/21/2020   Procedure: AMPUTATION RIGHT GREAT TOE;  Surgeon: Park Liter, DPM;  Location: [facility];  Service: Podiatry;   Laterality: Right;   GRAFT APPLICATION Right 11/19/2020   Procedure: GRAFT APPLICATION;  Surgeon: Park Liter, DPM;  Location: [facility];  Service: Podiatry;  Laterality: Right;   IRRIGATION AND DEBRIDEMENT FOOT Right 07/21/2020   Procedure: IRRIGATION AND DEBRIDEMENT RIGHT GREAT TOE;  Surgeon: Park Liter, DPM;  Location: [facility];  Service: Podiatry;  Laterality: Right;   METATARSAL HEAD EXCISION Right 07/23/2020   Procedure: METATARSAL HEAD EXCISION;  Surgeon: Park Liter, DPM;  Location: [facility];  Service: Podiatry;  Laterality: Right;   TONSILLECTOMY     WOUND DEBRIDEMENT Right 07/23/2020   Procedure: DEBRIDEMENT WOUND WITH DELAYED WOUND CLOSURE;  Surgeon: Park Liter, DPM;  Location: [facility];  Service: Podiatry;  Laterality: Right;   WOUND DEBRIDEMENT Right 09/17/2020   Procedure: DEBRIDEMENT OF RIGHT FOOT WOUND, PARTIAL WOUND CLOSURE, APPLICATION OF SKIN GRAFT SUBSTITUTE;  Surgeon: Park Liter, DPM;  Location: [facility];  Service: Podiatry;  Laterality: Right;   WOUND DEBRIDEMENT Right 10/15/2020   Procedure: DEBRIDEMENT WOUND with Application of skin graft substitute;  Surgeon: Park Liter, DPM;  Location: [facility];  Service: Podiatry;  Laterality: Right;   WOUND DEBRIDEMENT Right 11/19/2020   Procedure: DEBRIDEMENT WOUND;  Surgeon: Park Liter, DPM;  Location: [facility];  Service: Podiatry;  Laterality: Right;    Family History  Problem Relation Age of Onset   Diabetes Father    Pancreatic cancer Father    Lupus Daughter    Sjogren's syndrome Daughter    Diabetes Other        fam hx 1st degree relative   Congestive Heart Failure Other    Colon cancer Neg Hx    Prostate cancer Neg Hx    Esophageal cancer Neg Hx    Rectal cancer Neg Hx       Social History   Socioeconomic History   Marital status: Married    Spouse name: Not on file   Number of children: Not on file   Years of education: Not on file   Highest education level: Not on file  Occupational History    Occupation: Scientist, water quality    Employer: [employer]  Tobacco Use   Smoking status: Never   Smokeless tobacco: Never  Vaping Use   Vaping Use: Never used  Substance and Sexual Activity   Alcohol use: No    Alcohol/week: 0.0 standard drinks of alcohol   Drug use: No   Sexual activity: Not on file  Other Topics Concern   Not on file  Social History Narrative   Working at [employer]    4 kids   Married, 1998   From [place]   Social Determinants of Health   Financial Resource Strain: Not on [employer] Insecurity: Not on file  Transportation Needs: Not on file  Physical Activity: Not on file  Stress: Not on  file  Social Connections: Not on file    Allergies  Allergen Reactions   Lipitor [Atorvastatin] Other (See Comments)    Lightheaded & dizzy     Current Outpatient Medications:    carvedilol (COREG) 25 MG tablet, Take 1 tablet (25 mg total) by mouth 2 (two) times daily with a meal., Disp: , Rfl:    clopidogrel (PLAVIX) 75 MG tablet, TAKE 1 TABLET BY MOUTH EVERY DAY, Disp: 30 tablet, Rfl: 0   Continuous Blood Gluc Sensor (FREESTYLE LIBRE 14 DAY SENSOR) MISC, Use to check blood sugars daily. Dx: E11.9, Disp: 1 each, Rfl: 12   dapagliflozin propanediol (FARXIGA) 10 MG TABS tablet, 1 tab daily., Disp: 30 tablet, Rfl:    Dulaglutide (TRULICITY) 3 MG/0.5ML SOPN, Inject 3 mg under the skin  as directed once a week., Disp: 4 mL, Rfl: 3   ferrous sulfate 324 (65 Fe) MG TBEC, Take by mouth., Disp: , Rfl:    furosemide (LASIX) 40 MG tablet, Take 1 tablet (40 mg total) by mouth every other day., Disp: , Rfl:    glucose blood test strip, CHECK BLOOD SUGAR TWICE DAILY, Disp: 100 each, Rfl: 3   metFORMIN (GLUCOPHAGE) 500 MG tablet, TAKE 2 TABS IN MORNING AND 1 TAB EVENING, Disp: , Rfl:    rosuvastatin (CRESTOR) 40 MG tablet, Take 1 tablet (40 mg total) by mouth daily., Disp: , Rfl:    sacubitril-valsartan (ENTRESTO) 24-26 MG, Take 1 tablet by mouth 2 (two) times daily.,  Disp: , Rfl:    spironolactone (ALDACTONE) 25 MG tablet, Take 1 tablet by mouth daily. Held as of 07/26/22, Disp: , Rfl:    tobramycin (TOBREX) 0.3 % ophthalmic solution, Place 1 drop into both eyes See admin instructions. Instill 1 drop both eyes 3 times daily the day before eye injection every 6-8 weeks, Disp: , Rfl:    Review of Systems  Constitutional:  Negative for activity change, appetite change, chills, diaphoresis, fatigue, fever and unexpected weight change.  HENT:  Negative for congestion, rhinorrhea, sinus pressure, sneezing, sore throat and trouble swallowing.   Eyes:  Negative for photophobia and visual disturbance.  Respiratory:  Negative for cough, chest tightness, shortness of breath, wheezing and stridor.   Cardiovascular:  Negative for chest pain, palpitations and leg swelling.  Gastrointestinal:  Negative for abdominal distention, abdominal pain, anal bleeding, blood in stool, constipation, diarrhea, nausea and vomiting.  Genitourinary:  Negative for difficulty urinating, dysuria, flank pain and hematuria.  Musculoskeletal:  Negative for arthralgias, back pain, gait problem, joint swelling and myalgias.  Skin:  Negative for color change, pallor, rash and wound.  Neurological:  Negative for dizziness, tremors, weakness and light-headedness.  Hematological:  Negative for adenopathy. Does not bruise/bleed easily.  Psychiatric/Behavioral:  Negative for agitation, behavioral problems, confusion, decreased concentration, dysphoric mood and sleep disturbance.        Objective:   Physical Exam Constitutional:      Appearance: He is well-developed.  HENT:     Head: Normocephalic and atraumatic.  Eyes:     Conjunctiva/sclera: Conjunctivae normal.  Cardiovascular:     Rate and Rhythm: Normal rate and regular rhythm.  Pulmonary:     Effort: Pulmonary effort is normal. No respiratory distress.     Breath sounds: No wheezing.  Abdominal:     General: There is no distension.      Palpations: Abdomen is soft.  Musculoskeletal:        General: No tenderness. Normal range of motion.  Cervical back: Normal range of motion and neck supple.  Skin:    General: Skin is warm and dry.     Coloration: Skin is not pale.     Findings: No erythema or rash.  Neurological:     General: No focal deficit present.     Mental Status: He is alert and oriented to person, place, and time.  Psychiatric:        Mood and Affect: Mood normal.        Behavior: Behavior normal.        Thought Content: Thought content normal.        Judgment: Judgment normal.    Ulcer January 14, 2021:     02/25/2021:    Right lower extremity foot May 09, 2021    Right foot 07/11/2021:    Right foot 09/12/2021:     12/14/2021:    Left foot 12/14/2021:    Foot 07/03/22:       Assessment & Plan:     Will recheck a sed rate CRP CBC with differential and BMP today   Onychomycosis status post Lamisil I will recheck plain films of the foot.

## 2022-08-11 ENCOUNTER — Other Ambulatory Visit: Payer: Self-pay

## 2022-08-11 MED ORDER — CLOPIDOGREL BISULFATE 75 MG PO TABS: 75.0000 mg | ORAL_TABLET | Freq: Every day | ORAL | 0 refills | Status: DC

## 2022-09-11 ENCOUNTER — Telehealth: Payer: BC Managed Care – PPO | Admitting: Physician Assistant

## 2022-09-11 DIAGNOSIS — B348 Other viral infections of unspecified site: Secondary | ICD-10-CM | POA: Diagnosis not present

## 2022-09-11 MED ORDER — PROMETHAZINE-DM 6.25-15 MG/5ML PO SYRP
5.0000 mL | ORAL_SOLUTION | Freq: Four times a day (QID) | ORAL | 0 refills | Status: DC | PRN
Start: 2022-09-11 — End: 2022-09-21

## 2022-09-11 MED ORDER — BENZONATATE 100 MG PO CAPS
100.0000 mg | ORAL_CAPSULE | Freq: Three times a day (TID) | ORAL | 0 refills | Status: DC | PRN
Start: 2022-09-11 — End: 2022-11-24

## 2022-09-11 NOTE — Patient Instructions (Signed)
Austin Shaw, thank you for joining [name], PA-C for today's virtual visit.  While this provider is not your primary care provider (PCP), if your PCP is located in our provider database this encounter information will be shared with them immediately following your visit.   A [facility] MyChart account gives you access to today's visit and all your visits, tests, and labs performed at [facility] " click here if you don't have a [facility] MyChart account or go to [email or web address]  Consent: (Patient) Austin Shaw provided verbal consent for this virtual visit at the beginning of the encounter.  Current Medications:  Current Outpatient Medications:    benzonatate (TESSALON) 100 MG capsule, Take 1 capsule (100 mg total) by mouth 3 (three) times daily as needed for cough., Disp: 30 capsule, Rfl: 0   promethazine-dextromethorphan (PROMETHAZINE-DM) 6.25-15 MG/5ML syrup, Take 5 mLs by mouth 4 (four) times daily as needed for cough., Disp: 118 mL, Rfl: 0   carvedilol (COREG) 25 MG tablet, Take 1 tablet (25 mg total) by mouth 2 (two) times daily with a meal., Disp: , Rfl:    clopidogrel (PLAVIX) 75 MG tablet, Take 1 tablet (75 mg total) by mouth daily., Disp: 15 tablet, Rfl: 0   Continuous Blood Gluc Sensor (FREESTYLE LIBRE 14 DAY SENSOR) MISC, Use to check blood sugars daily. Dx: E11.9, Disp: 1 each, Rfl: 12   dapagliflozin propanediol (FARXIGA) 10 MG TABS tablet, 1 tab daily., Disp: 30 tablet, Rfl:    Dulaglutide (TRULICITY) 3 MG/0.5ML SOPN, Inject 3 mg under the skin  as directed once a week., Disp: 4 mL, Rfl: 3   ferrous sulfate 324 (65 Fe) MG TBEC, Take by mouth., Disp: , Rfl:    furosemide (LASIX) 40 MG tablet, Take 1 tablet (40 mg total) by mouth every other day., Disp: , Rfl:    glucose blood test strip, CHECK BLOOD SUGAR TWICE DAILY, Disp: 100 each, Rfl: 3   metFORMIN (GLUCOPHAGE) 500 MG tablet, TAKE 2 TABS IN MORNING AND 1 TAB EVENING, Disp: ,  Rfl:    rosuvastatin (CRESTOR) 40 MG tablet, Take 1 tablet (40 mg total) by mouth daily., Disp: , Rfl:    sacubitril-valsartan (ENTRESTO) 24-26 MG, Take 1 tablet by mouth 2 (two) times daily., Disp: , Rfl:    spironolactone (ALDACTONE) 25 MG tablet, Take 1 tablet by mouth daily. Held as of 07/26/22, Disp: , Rfl:    tobramycin (TOBREX) 0.3 % ophthalmic solution, Place 1 drop into both eyes See admin instructions. Instill 1 drop both eyes 3 times daily the day before eye injection every 6-8 weeks, Disp: , Rfl:    Medications ordered in this encounter:  Meds ordered this encounter  Medications   benzonatate (TESSALON) 100 MG capsule    Sig: Take 1 capsule (100 mg total) by mouth 3 (three) times daily as needed for cough.    Dispense:  30 capsule    Refill:  0    Order Specific Question:   Supervising Provider    Answer:   Merrilee Jansky [id number]   promethazine-dextromethorphan (PROMETHAZINE-DM) 6.25-15 MG/5ML syrup    Sig: Take 5 mLs by mouth 4 (four) times daily as needed for cough.    Dispense:  118 mL    Refill:  0    Order Specific Question:   Supervising Provider    Answer:   Merrilee Jansky [id number]     *If you need refills on other medications prior to your next appointment, please  contact your pharmacy*  Follow-Up: Call back or seek an in-person evaluation if the symptoms worsen or if the condition fails to improve as anticipated.  [facility] [phone]  Other Instructions Please stay hydrated and rest. Ok to use plain Mucinex as directed. The Tessalon is for daytime cough. The syrup can be used in the evening.  Follow-up in person if symptoms are not resolving or you note new/worsening symptoms despite treatment.    If you have been instructed to have an in-person evaluation today at a local Urgent Care facility, please use the link below. It will take you to a list of all of our available [facility], including address, phone number  and hours of operation. Please do not delay care.  [facility]  If you or a family member do not have a primary care provider, use the link below to schedule a visit and establish care. When you choose a [facility] primary care physician or advanced practice provider, you gain a long-term partner in health. Find a Primary Care Provider  Learn more about [facility] in-office and virtual care options: [facility] - Get Care Now

## 2022-09-11 NOTE — Progress Notes (Signed)
Virtual Visit Consent   Austin Shaw, you are scheduled for a virtual visit with a [facility] provider today. Just as with appointments in the office, your consent must be obtained to participate. Your consent will be active for this visit and any virtual visit you may have with one of our providers in the next 365 days. If you have a MyChart account, a copy of this consent can be sent to you electronically.  As this is a virtual visit, video technology does not allow for your provider to perform a traditional examination. This may limit your provider's ability to fully assess your condition. If your provider identifies any concerns that need to be evaluated in person or the need to arrange testing (such as labs, EKG, etc.), we will make arrangements to do so. Although advances in technology are sophisticated, we cannot ensure that it will always work on either your end or our end. If the connection with a video visit is poor, the visit may have to be switched to a telephone visit. With either a video or telephone visit, we are not always able to ensure that we have a secure connection.  By engaging in this virtual visit, you consent to the provision of healthcare and authorize for your insurance to be billed (if applicable) for the services provided during this visit. Depending on your insurance coverage, you may receive a charge related to this service.  I need to obtain your verbal consent now. Are you willing to proceed with your visit today? Abdulkadir D Crofts has provided verbal consent on 09/11/2022 for a virtual visit (video or telephone). Austin Shaw, [place]  Date: 09/11/2022 9:32 AM  Virtual Visit via Video Note   I, Austin Shaw, connected with  Austin Shaw  ([id number], 1966-02-13) on 09/11/22 at  9:15 AM EDT by a video-enabled telemedicine application and verified that I am speaking with the correct person using two identifiers.  Location: Patient: Virtual  Visit Location Patient: Home Provider: Virtual Visit Location Provider: Home Office   I discussed the limitations of evaluation and management by telemedicine and the availability of in person appointments. The patient expressed understanding and agreed to proceed.    History of Present Illness: Austin Shaw is a 57 y.o. who identifies as a male who was assigned male at birth, and is being seen today for few days of chest congestion and cough that is dry but persistent and affecting sleep. Denies fever, chills, chest tightness or shortness of breath. Has history of chronic systolic heart failure -- on ARB currently as insurance would not continue to cover Entresto. End. No increased swelling. Denies recent travel or sick contact. 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   Acute osteomyelitis of toe (HCC) 08/26/2020   PVD (peripheral vascular disease) (HCC) 07/20/2020   Right lateral abdominal pain 03/23/2019   Chronic systolic heart failure (HCC) 05/12/2018   Advance care planning 11/20/2017   Decreased cardiac ejection fraction 08/11/2014   Cardiomyopathy (HCC) 08/11/2014   SOB (shortness of breath) 08/06/2014   Cough 06/11/2014   Diabetic retinopathy (HCC) 03/22/2014   Impotence 12/21/2010   Routine general medical examination at a health care facility 10/16/2010   Type 2 diabetes mellitus with other specified complication (HCC) 11/11/2008   OTHER MALAISE AND FATIGUE 11/11/2008   OBESITY, MORBID 08/14/2008   Hyperlipidemia 12/26/2006  Essential hypertension 12/26/2006    Allergies:  Allergies  Allergen Reactions   Lipitor [Atorvastatin] Other (See Comments)    Lightheaded & dizzy   Medications:  Current Outpatient Medications:    benzonatate (TESSALON) 100 MG capsule, Take 1 capsule (100 mg total) by mouth 3 (three) times daily as needed for cough., Disp:  30 capsule, Rfl: 0   promethazine-dextromethorphan (PROMETHAZINE-DM) 6.25-15 MG/5ML syrup, Take 5 mLs by mouth 4 (four) times daily as needed for cough., Disp: 118 mL, Rfl: 0   carvedilol (COREG) 25 MG tablet, Take 1 tablet (25 mg total) by mouth 2 (two) times daily with a meal., Disp: , Rfl:    clopidogrel (PLAVIX) 75 MG tablet, Take 1 tablet (75 mg total) by mouth daily., Disp: 15 tablet, Rfl: 0   Continuous Blood Gluc Sensor (FREESTYLE LIBRE 14 DAY SENSOR) MISC, Use to check blood sugars daily. Dx: E11.9, Disp: 1 each, Rfl: 12   dapagliflozin propanediol (FARXIGA) 10 MG TABS tablet, 1 tab daily., Disp: 30 tablet, Rfl:    Dulaglutide (TRULICITY) 3 MG/0.5ML SOPN, Inject 3 mg under the skin  as directed once a week., Disp: 4 mL, Rfl: 3   ferrous sulfate 324 (65 Fe) MG TBEC, Take by mouth., Disp: , Rfl:    furosemide (LASIX) 40 MG tablet, Take 1 tablet (40 mg total) by mouth every other day., Disp: , Rfl:    glucose blood test strip, CHECK BLOOD SUGAR TWICE DAILY, Disp: 100 each, Rfl: 3   metFORMIN (GLUCOPHAGE) 500 MG tablet, TAKE 2 TABS IN MORNING AND 1 TAB EVENING, Disp: , Rfl:    rosuvastatin (CRESTOR) 40 MG tablet, Take 1 tablet (40 mg total) by mouth daily., Disp: , Rfl:    sacubitril-valsartan (ENTRESTO) 24-26 MG, Take 1 tablet by mouth 2 (two) times daily., Disp: , Rfl:    spironolactone (ALDACTONE) 25 MG tablet, Take 1 tablet by mouth daily. Held as of 07/26/22, Disp: , Rfl:    tobramycin (TOBREX) 0.3 % ophthalmic solution, Place 1 drop into both eyes See admin instructions. Instill 1 drop both eyes 3 times daily the day before eye injection every 6-8 weeks, Disp: , Rfl:   Observations/Objective: Patient is well-developed, well-nourished in no acute distress.  Resting comfortably at home.  Head is normocephalic, atraumatic.  No labored breathing. Speech is clear and coherent with logical content.  Patient is alert and oriented at baseline.   Assessment and Plan: 1. Parainfluenza -  benzonatate (TESSALON) 100 MG capsule; Take 1 capsule (100 mg total) by mouth 3 (three) times daily as needed for cough.  Dispense: 30 capsule; Refill: 0 - promethazine-dextromethorphan (PROMETHAZINE-DM) 6.25-15 MG/5ML syrup; Take 5 mLs by mouth 4 (four) times daily as needed for cough.  Dispense: 118 mL; Refill: 0  Tested positive at Cardiologists office. Residual dry but persistent cough. Supportive measures and OTC medications reviewed. Tessalon and promethazine-dm per orders. Strict follow-up precautions reviewed.   Follow Up Instructions: I discussed the assessment and treatment plan with the patient. The patient was provided an opportunity to ask questions and all were answered. The patient agreed with the plan and demonstrated an understanding of the instructions.  A copy of instructions were sent to the patient via MyChart unless otherwise noted below.   The patient was advised to call back or seek an in-person evaluation if the symptoms worsen or if the condition fails to improve as anticipated.  Time:  I spent 10 minutes with the patient via telehealth technology discussing the above problems/concerns.  Austin Climes, PA-C

## 2022-09-13 LAB — HM DIABETES EYE EXAM

## 2022-09-15 ENCOUNTER — Other Ambulatory Visit: Payer: Self-pay

## 2022-09-18 ENCOUNTER — Other Ambulatory Visit: Payer: Self-pay

## 2022-09-19 ENCOUNTER — Other Ambulatory Visit (HOSPITAL_COMMUNITY): Payer: Self-pay

## 2022-09-21 ENCOUNTER — Ambulatory Visit: Payer: BC Managed Care – PPO | Admitting: Family Medicine

## 2022-09-21 ENCOUNTER — Encounter: Payer: Self-pay | Admitting: Family Medicine

## 2022-09-21 VITALS — BP 120/80 | HR 92 | Temp 98.0°F | Ht 70.0 in | Wt 333.0 lb

## 2022-09-21 DIAGNOSIS — R059 Cough, unspecified: Secondary | ICD-10-CM | POA: Diagnosis not present

## 2022-09-21 DIAGNOSIS — Z7984 Long term (current) use of oral hypoglycemic drugs: Secondary | ICD-10-CM | POA: Diagnosis not present

## 2022-09-21 DIAGNOSIS — E1169 Type 2 diabetes mellitus with other specified complication: Secondary | ICD-10-CM | POA: Diagnosis not present

## 2022-09-21 DIAGNOSIS — I10 Essential (primary) hypertension: Secondary | ICD-10-CM

## 2022-09-21 MED ORDER — ENTRESTO 49-51 MG PO TABS: 1.0000 | ORAL_TABLET | Freq: Two times a day (BID) | ORAL | Status: DC

## 2022-09-21 MED ORDER — FERROUS SULFATE 324 (65 FE) MG PO TBEC: 325.0000 mg | DELAYED_RELEASE_TABLET | ORAL | Status: DC

## 2022-09-21 MED ORDER — PROMETHAZINE-DM 6.25-15 MG/5ML PO SYRP: 5.0000 mL | ORAL_SOLUTION | Freq: Four times a day (QID) | ORAL | 1 refills | Status: DC | PRN

## 2022-09-21 MED ORDER — SPIRONOLACTONE 25 MG PO TABS: 25.0000 mg | ORAL_TABLET | Freq: Every day | ORAL | Status: DC

## 2022-09-21 NOTE — Patient Instructions (Signed)
Go to the lab on the way out.   If you have mychart we'll likely use that to update you.    Use the cough syrup as needed.  Take care.  Glad to see you. If symptoms are worse, then [place] know/get rechecked.

## 2022-09-21 NOTE — Progress Notes (Signed)
Patient has had a cough for a couple of weeks. He was told he had the flu when he was sick but hasn't been able to get rid of the cough. Doesn't really cough much up but if he does sometimes there is a scant amount of blood, ie streaked- that is decreasing.  Tessalon didn't help a lot.  No fevers.  No vomiting.  Can take a deep breath.  He is back to work in the meantime.  Prev promethazine-dextromethorphan helped with cough more.    He was able to change back to entresto in the meantime.    He prev tested pos for parainfluenza.  Discussed.  Due for recheck Cr and MALB.  D/w pt.    Meds, vitals, and allergies reviewed.   ROS: Per HPI unless specifically indicated in ROS section   Nad Ncat TM wnl.  MMM OP wnl Neck supple, no LA Ctab, no focal decrease in breath sounds RRR Skin well-perfused.

## 2022-09-22 LAB — BASIC METABOLIC PANEL
BUN: 23 mg/dL (ref 6–23)
CO2: 29 mEq/L (ref 19–32)
Calcium: 9.2 mg/dL (ref 8.4–10.5)
Chloride: 102 mEq/L (ref 96–112)
Creatinine, Ser: 1.55 mg/dL — ABNORMAL HIGH (ref 0.40–1.50)
GFR: 50.13 mL/min — ABNORMAL LOW (ref >60.00)
Glucose, Bld: 132 mg/dL — ABNORMAL HIGH (ref 70–99)
Potassium: 4.9 mEq/L (ref 3.5–5.1)
Sodium: 139 mEq/L (ref 135–145)

## 2022-09-22 LAB — MICROALBUMIN / CREATININE URINE RATIO
Creatinine,U: 108.4 mg/dL
Microalb Creat Ratio: 132.6 mg/g — ABNORMAL HIGH (ref 0.0–30.0)
Microalb, Ur: 143.8 mg/dL — ABNORMAL HIGH (ref 0.0–1.9)

## 2022-09-24 ENCOUNTER — Encounter: Payer: Self-pay | Admitting: Family Medicine

## 2022-09-24 ENCOUNTER — Other Ambulatory Visit: Payer: Self-pay | Admitting: Family Medicine

## 2022-09-24 NOTE — Assessment & Plan Note (Signed)
Suspect that he has a postinfectious cough.  He is gradually improving.  At this point still okay for outpatient follow-up. Use promethazine dextromethorphan cough syrup as needed.  That helped previously.  Sedation caution. If symptoms are worse, then [place] know/get rechecked.  Should gradually improve in the meantime.

## 2022-09-24 NOTE — Assessment & Plan Note (Signed)
  Due for recheck Cr and MALB.  D/w pt.

## 2022-09-25 MED ORDER — DAPAGLIFLOZIN PROPANEDIOL 10 MG PO TABS: ORAL_TABLET | ORAL | Status: DC

## 2022-09-25 NOTE — Telephone Encounter (Signed)
Pt's wife, Cotina, came by office requesting to speak to Shanda Bumps regarding the note needed from Dr. Para March. Cotina stated the note needs to state the pt needs a chair for work. Cotina stated the pt has diabetic neuropathy & cannot stand while working. Cotina stated the pt is at work today & desperately needs note. Call back # [phone]

## 2022-09-25 NOTE — Telephone Encounter (Signed)
Letter done, please send signed copy to patient. Thanks.

## 2022-10-01 ENCOUNTER — Other Ambulatory Visit: Payer: Self-pay | Admitting: Cardiovascular Disease

## 2022-10-02 ENCOUNTER — Encounter: Payer: Self-pay | Admitting: Family Medicine

## 2022-10-02 MED ORDER — DAPAGLIFLOZIN PROPANEDIOL 10 MG PO TABS: ORAL_TABLET | ORAL | 3 refills | Status: DC

## 2022-10-11 ENCOUNTER — Other Ambulatory Visit: Payer: Self-pay | Admitting: Cardiovascular Disease

## 2022-10-13 ENCOUNTER — Other Ambulatory Visit: Payer: Self-pay

## 2022-10-17 ENCOUNTER — Ambulatory Visit: Payer: BC Managed Care – PPO | Admitting: Podiatry

## 2022-10-17 DIAGNOSIS — Q666 Other congenital valgus deformities of feet: Secondary | ICD-10-CM | POA: Diagnosis not present

## 2022-10-17 DIAGNOSIS — S90821A Blister (nonthermal), right foot, initial encounter: Secondary | ICD-10-CM | POA: Diagnosis not present

## 2022-10-17 NOTE — Progress Notes (Signed)
Subjective:  Patient ID: Austin Shaw, male    DOB: [birth year],  MRN: [id number]  Chief Complaint  Patient presents with   Foot Ulcer    57 y.o. male presents with the above complaint.  Patient presents with new complaint of right forefoot blister.  Patient is a diabetic.  He states that he started walking and increasing amount however he started doing it without his orthotics and they may have led to the blister formation.  He states that it is not too bad but wanted to get it evaluated.  There is no infection and does not have any nausea fever chills vomiting.  He would also like to obtain a new pair of orthotics.   Review of Systems: Negative except as noted in the HPI. Denies N/V/F/Ch.  Past Medical History:  Diagnosis Date   Acute osteomyelitis of toe (HCC) 08/26/2020   Allergy    CHF (congestive heart failure) (HCC)    Diabetes mellitus    type 2   E coli infection 09/20/2020   Group B streptococcal infection 09/20/2020   Hyperlipidemia    Hypertension    Hypertension    Morbid obesity (HCC)    BMI 50   Peripheral vascular disease (HCC)    Sleep apnea    pt states he no longer has sleep apnea since tonsillectomy    Current Outpatient Medications:    benzonatate (TESSALON) 100 MG capsule, Take 1 capsule (100 mg total) by mouth 3 (three) times daily as needed for cough., Disp: 30 capsule, Rfl: 0   carvedilol (COREG) 25 MG tablet, Take 1 tablet (25 mg total) by mouth 2 (two) times daily with a meal., Disp: , Rfl:    clopidogrel (PLAVIX) 75 MG tablet, Take 1 tablet (75 mg total) by mouth daily., Disp: 15 tablet, Rfl: 0   Continuous Blood Gluc Sensor (FREESTYLE LIBRE 14 DAY SENSOR) MISC, Use to check blood sugars daily. Dx: E11.9, Disp: 1 each, Rfl: 12   dapagliflozin propanediol (FARXIGA) 10 MG TABS tablet, 1 tab daily., Disp: 30 tablet, Rfl: 3   Dulaglutide (TRULICITY) 3 MG/0.5ML SOPN, Inject 3 mg under the skin  as directed once a week., Disp: 4 mL, Rfl: 3   ferrous  sulfate 324 (65 Fe) MG TBEC, Take 1 tablet (325 mg total) by mouth every other day., Disp: , Rfl:    furosemide (LASIX) 40 MG tablet, Take 1 tablet (40 mg total) by mouth every other day., Disp: , Rfl:    glucose blood test strip, CHECK BLOOD SUGAR TWICE DAILY, Disp: 100 each, Rfl: 3   metFORMIN (GLUCOPHAGE) 500 MG tablet, TAKE 2 TABS IN MORNING AND 1 TAB EVENING, Disp: , Rfl:    promethazine-dextromethorphan (PROMETHAZINE-DM) 6.25-15 MG/5ML syrup, Take 5 mLs by mouth 4 (four) times daily as needed for cough., Disp: 180 mL, Rfl: 1   rosuvastatin (CRESTOR) 40 MG tablet, Take 1 tablet (40 mg total) by mouth daily., Disp: , Rfl:    sacubitril-valsartan (ENTRESTO) 49-51 MG, Take 1 tablet by mouth 2 (two) times daily., Disp: , Rfl:    spironolactone (ALDACTONE) 25 MG tablet, Take 1 tablet (25 mg total) by mouth daily., Disp: , Rfl:    tobramycin (TOBREX) 0.3 % ophthalmic solution, Place 1 drop into both eyes See admin instructions. Instill 1 drop both eyes 3 times daily the day before eye injection every 6-8 weeks, Disp: , Rfl:   Social History   Tobacco Use  Smoking Status Never  Smokeless Tobacco Never  Allergies  Allergen Reactions   Lipitor [Atorvastatin] Other (See Comments)    Lightheaded & dizzy   Objective:  There were no vitals filed for this visit. There is no height or weight on file to calculate BMI. Constitutional Well developed. Well nourished.  Vascular Dorsalis pedis pulses palpable bilaterally. Posterior tibial pulses palpable bilaterally. Capillary refill normal to all digits.  No cyanosis or clubbing noted. Pedal hair growth normal.  Neurologic Normal speech. Oriented to person, place, and time. Epicritic sensation to light touch grossly present bilaterally.  Dermatologic Right foot blister formation noted with limited to the breakdown of the skin.  No deeper wounds noted.  No signs of infection noted.  Orthopedic: Normal joint ROM without pain or crepitus  bilaterally. No visible deformities. No bony tenderness.   Radiographs: None Assessment:   1. Pes planovalgus   2. Blister of plantar aspect of right foot, initial encounter    Plan:  Patient was evaluated and treated and all questions answered.  Right foot blister status post debridement -All questions and concerns were discussed with the patient in extensive detail -There is no clinical concern for infection.  Encouraged Betadine wet-to-dry dressing every single day for the next 3 weeks.  He states understanding.  Pes planovalgus -I explained to patient the etiology of pes planovalgus and relationship with Planter fasciitis and various treatment options were discussed.  Given patient foot structure in the setting of Planter fasciitis I believe patient will benefit from custom-made orthotics to help control the hindfoot motion support the arch of the foot and take the stress away from plantar fascial.  Patient agrees with the plan like to proceed with orthotics -Patient was casted for orthotics with diabetic accommodative    No follow-ups on file.  New pair of orthotics pes planovalgus was casted for

## 2022-10-24 ENCOUNTER — Encounter: Payer: Self-pay | Admitting: Podiatry

## 2022-10-24 NOTE — Telephone Encounter (Signed)
Can we see about getting him to see me? Thanks!

## 2022-10-26 ENCOUNTER — Ambulatory Visit: Payer: BC Managed Care – PPO | Admitting: Podiatry

## 2022-10-26 ENCOUNTER — Ambulatory Visit (INDEPENDENT_AMBULATORY_CARE_PROVIDER_SITE_OTHER): Payer: BC Managed Care – PPO

## 2022-10-26 DIAGNOSIS — M79671 Pain in right foot: Secondary | ICD-10-CM | POA: Diagnosis not present

## 2022-10-26 DIAGNOSIS — I739 Peripheral vascular disease, unspecified: Secondary | ICD-10-CM | POA: Diagnosis not present

## 2022-10-26 DIAGNOSIS — L97512 Non-pressure chronic ulcer of other part of right foot with fat layer exposed: Secondary | ICD-10-CM

## 2022-10-26 MED ORDER — SILVER SULFADIAZINE 1 % EX CREA: 1.0000 | TOPICAL_CREAM | Freq: Every day | CUTANEOUS | 0 refills | Status: DC

## 2022-10-26 MED ORDER — TRAMADOL HCL 50 MG PO TABS: 50.0000 mg | ORAL_TABLET | Freq: Three times a day (TID) | ORAL | 0 refills | Status: AC | PRN

## 2022-10-26 MED ORDER — DOXYCYCLINE HYCLATE 100 MG PO TABS: 100.0000 mg | ORAL_TABLET | Freq: Two times a day (BID) | ORAL | 0 refills | Status: DC

## 2022-10-26 NOTE — Progress Notes (Signed)
Subjective: Chief Complaint  Patient presents with   Diabetic Ulcer    Patient came in today for right foot Diabetic ulcer, follow-up, rate of pain 4 out of 10, some drainage, A1c-8.6 BG- 22   57 year old male presents the office with above concerns with his wife. He states that he tried to increase his walking and not wearing the inserts and it has caused a blister that opened up. He was last seen by Dr. Allena Katz in our office. He has been getting some swelling and gets intermittent pain.  No fevers or chills.  Objective: AAO x3, NAD DP/PT pulses palpable bilaterally, CRT less than 3 seconds Ulceration noted on the plantar aspect the right foot submetatarsal.  The picture below is the picture he sent me the picture I took today did not save.  Granular ulceration present with some epidermolysis present appropriate the wound.  There is mild edema.  No significant cellulitis.  There is no purulence.  No fluctuation or crepitation but there is no. No pain with calf compression, swelling, warmth, erythema  Picture from 10/24/2022 from the patient:   Assessment: Ulceration right foot  Plan: -All treatment options discussed with the patient including all alternatives, risks, complications.  -I debrided the loose tissue on the periphery of the wound today and debrided some of the wound edges and healthy, bleeding tissue.  I cleaned the area.  Silvadene was applied followed by dressing.  Continue with daily dressing changes. -Doxycycline -Referral faxed to [facility] -Surgical shoe with peg assist for offloading -Tramadol prn for pain -Monitor for any clinical signs or symptoms of infection and directed to call the office immediately should any occur or go to the ER. -Patient encouraged to call the office with any questions, concerns, change in symptoms.   Return in about 1 week (around 11/02/2022) for right foot ulcer.  Vivi Barrack DPM

## 2022-10-26 NOTE — Patient Instructions (Signed)
Monitor for any signs/symptoms of infection. Call the office immediately if any occur or go directly to the emergency room. Call with any questions/concerns.  

## 2022-10-30 ENCOUNTER — Ambulatory Visit: Payer: BC Managed Care – PPO | Admitting: Podiatry

## 2022-11-01 ENCOUNTER — Telehealth: Payer: Self-pay | Admitting: Podiatry

## 2022-11-01 NOTE — Telephone Encounter (Signed)
Pt Wife called to inform the Dr that he is now in the hospital. Wife wants to know should they keep the appointment for tomorrow or try to r/s for next week.   Please advise

## 2022-11-02 ENCOUNTER — Ambulatory Visit: Payer: BC Managed Care – PPO | Admitting: Podiatry

## 2022-11-06 ENCOUNTER — Encounter: Payer: Self-pay | Admitting: Family Medicine

## 2022-11-06 ENCOUNTER — Telehealth: Payer: Self-pay

## 2022-11-06 NOTE — Transitions of Care (Post Inpatient/ED Visit) (Unsigned)
   11/06/2022  Name: Austin Shaw MRN: [id number] DOB: 02-19-1966  Today's TOC FU Call Status: Today's TOC FU Call Status:: Unsuccessul Call (1st Attempt) Unsuccessful Call (1st Attempt) Date: 11/06/22  Attempted to reach the patient regarding the most recent Inpatient/ED visit.  Follow Up Plan: Additional outreach attempts will be made to reach the patient to complete the Transitions of Care (Post Inpatient/ED visit) call.   Signature   Woodfin Ganja LPN [facility] Nurse Health Advisor Direct Dial [phone]

## 2022-11-07 ENCOUNTER — Other Ambulatory Visit: Payer: Self-pay | Admitting: Family Medicine

## 2022-11-07 MED ORDER — OXYCODONE HCL 5 MG PO TABS: 5.0000 mg | ORAL_TABLET | Freq: Four times a day (QID) | ORAL | 0 refills | Status: DC | PRN

## 2022-11-07 NOTE — Transitions of Care (Post Inpatient/ED Visit) (Unsigned)
   11/07/2022  Name: Austin Shaw MRN: [id number] DOB: 1966/02/01  Today's TOC FU Call Status: Today's TOC FU Call Status:: Unsuccessful Call (2nd Attempt) Unsuccessful Call (1st Attempt) Date: 11/06/22 Unsuccessful Call (2nd Attempt) Date: 11/07/22  Attempted to reach the patient regarding the most recent Inpatient/ED visit.  Follow Up Plan: Additional outreach attempts will be made to reach the patient to complete the Transitions of Care (Post Inpatient/ED visit) call.   Signature   Woodfin Ganja LPN [facility] Nurse Health Advisor Direct Dial [phone]

## 2022-11-08 NOTE — Transitions of Care (Post Inpatient/ED Visit) (Signed)
   11/08/2022  Name: Austin Shaw MRN: [id number] DOB: 06/23/1965  Today's TOC FU Call Status: Today's TOC FU Call Status:: Successful TOC FU Call Completed Unsuccessful Call (1st Attempt) Date: 11/06/22 Unsuccessful Call (2nd Attempt) Date: 11/07/22 Unsuccessful Call (3rd Attempt) Date: 11/08/22 [facility] FU Call Complete Date: 11/08/22  Attempted to reach the patient regarding the most recent Inpatient/ED visit.  Follow Up Plan: No further outreach attempts will be made at this time. We have been unable to contact the patient.  Signature  Woodfin Ganja LPN [facility] Nurse Health Advisor Direct Dial [phone]

## 2022-11-16 DIAGNOSIS — L97512 Non-pressure chronic ulcer of other part of right foot with fat layer exposed: Secondary | ICD-10-CM

## 2022-11-16 DIAGNOSIS — I130 Hypertensive heart and chronic kidney disease with heart failure and stage 1 through stage 4 chronic kidney disease, or unspecified chronic kidney disease: Secondary | ICD-10-CM

## 2022-11-16 DIAGNOSIS — E1151 Type 2 diabetes mellitus with diabetic peripheral angiopathy without gangrene: Secondary | ICD-10-CM

## 2022-11-16 DIAGNOSIS — Z89411 Acquired absence of right great toe: Secondary | ICD-10-CM

## 2022-11-16 DIAGNOSIS — E7849 Other hyperlipidemia: Secondary | ICD-10-CM

## 2022-11-16 DIAGNOSIS — L97511 Non-pressure chronic ulcer of other part of right foot limited to breakdown of skin: Secondary | ICD-10-CM

## 2022-11-16 DIAGNOSIS — B9562 Methicillin resistant Staphylococcus aureus infection as the cause of diseases classified elsewhere: Secondary | ICD-10-CM

## 2022-11-16 DIAGNOSIS — N182 Chronic kidney disease, stage 2 (mild): Secondary | ICD-10-CM

## 2022-11-16 DIAGNOSIS — I2510 Atherosclerotic heart disease of native coronary artery without angina pectoris: Secondary | ICD-10-CM

## 2022-11-16 DIAGNOSIS — L03115 Cellulitis of right lower limb: Secondary | ICD-10-CM

## 2022-11-16 DIAGNOSIS — Z9181 History of falling: Secondary | ICD-10-CM

## 2022-11-16 DIAGNOSIS — Z7985 Long-term (current) use of injectable non-insulin antidiabetic drugs: Secondary | ICD-10-CM

## 2022-11-16 DIAGNOSIS — Z792 Long term (current) use of antibiotics: Secondary | ICD-10-CM

## 2022-11-16 DIAGNOSIS — I70209 Unspecified atherosclerosis of native arteries of extremities, unspecified extremity: Secondary | ICD-10-CM

## 2022-11-16 DIAGNOSIS — I5022 Chronic systolic (congestive) heart failure: Secondary | ICD-10-CM

## 2022-11-16 DIAGNOSIS — Z7984 Long term (current) use of oral hypoglycemic drugs: Secondary | ICD-10-CM

## 2022-11-16 DIAGNOSIS — B950 Streptococcus, group A, as the cause of diseases classified elsewhere: Secondary | ICD-10-CM

## 2022-11-16 DIAGNOSIS — E11621 Type 2 diabetes mellitus with foot ulcer: Secondary | ICD-10-CM

## 2022-11-16 DIAGNOSIS — E1122 Type 2 diabetes mellitus with diabetic chronic kidney disease: Secondary | ICD-10-CM

## 2022-11-20 ENCOUNTER — Telehealth: Payer: Self-pay

## 2022-11-20 LAB — BASIC METABOLIC PANEL
Creatinine: 1 (ref 0.6–1.3)
Glucose: 88
Potassium: 3.9 mEq/L (ref 3.5–5.1)

## 2022-11-20 LAB — CBC AND DIFFERENTIAL
HCT: 31 — AB (ref 41–53)
Hemoglobin: 9.6 — AB (ref 13.5–17.5)
Platelets: 318 10*3/uL (ref 150–400)
WBC: 7.5

## 2022-11-20 LAB — COMPREHENSIVE METABOLIC PANEL
Albumin: 3 — AB (ref 3.5–5.0)
Calcium: 8.6 — AB (ref 8.7–10.7)

## 2022-11-20 LAB — HEPATIC FUNCTION PANEL
ALT: 34 U/L (ref 10–40)
AST: 34 (ref 14–40)

## 2022-11-20 NOTE — Transitions of Care (Post Inpatient/ED Visit) (Signed)
11/20/2022  Name: Austin Shaw MRN: [id number] DOB: Nov 24, 1965  Today's TOC FU Call Status: Today's TOC FU Call Status:: Successful TOC FU Call Completed TOC FU Call Complete Date: 11/20/22  Transition Care Management Follow-up Telephone Call Date of Discharge: 11/19/22 Discharge Facility: Other ([facility]) Name of Other ([facility]) Discharge Facility: [facility] Type of Discharge: Inpatient Admission Primary Inpatient Discharge Diagnosis:: Other osteomyelitis of right foot How have you been since you were released from the hospital?: Better Any questions or concerns?: No  Items Reviewed: Did you receive and understand the discharge instructions provided?: Yes Medications obtained,verified, and reconciled?: Yes (Medications Reviewed) Any new allergies since your discharge?: No Dietary orders reviewed?: Yes Do you have support at home?: Yes  Medications Reviewed Today: Medications Reviewed Today     Reviewed by Merleen Nicely, LPN (Licensed Practical Nurse) on 11/20/22 at 1143  Med List Status: <None>   Medication Order Taking? Sig Documenting Provider Last Dose Status Informant  benzonatate (TESSALON) 100 MG capsule [id number] No Take 1 capsule (100 mg total) by mouth 3 (three) times daily as needed for cough.  Patient not taking: Reported on 11/20/2022   Waldon Merl, PA-C Not Taking Active   carvedilol (COREG) 25 MG tablet [id number] Yes Take 1 tablet (25 mg total) by mouth 2 (two) times daily with a meal. Joaquim Nam, MD Taking Active   clopidogrel (PLAVIX) 75 MG tablet [id number] Yes Take 1 tablet (75 mg total) by mouth daily. Kathleene Hazel, MD Taking Active   Continuous Blood Gluc Sensor (FREESTYLE LIBRE 14 DAY SENSOR) [place] [id number] Yes Use to check blood sugars daily. Dx: E11.9 Joaquim Nam, MD Taking Active   dapagliflozin propanediol (FARXIGA) 10 MG TABS tablet [id number] Yes 1 tab daily. Joaquim Nam, MD Taking Active   doxycycline  (VIBRA-TABS) 100 MG tablet [id number] No Take 1 tablet (100 mg total) by mouth 2 (two) times daily.  Patient not taking: Reported on 11/20/2022   Vivi Barrack, DPM Not Taking Active   Dulaglutide (TRULICITY) 3 MG/0.5ML Namon Cirri [id number] Yes Inject 3 mg under the skin  as directed once a week. Joaquim Nam, MD Taking Active   ferrous sulfate 324 (65 Fe) MG TBEC [id number] No Take 1 tablet (325 mg total) by mouth every other day.  Patient not taking: Reported on 11/20/2022   Joaquim Nam, MD Not Taking Active   furosemide (LASIX) 40 MG tablet [id number] Yes Take 1 tablet (40 mg total) by mouth every other day. Joaquim Nam, MD Taking Active   glucose blood test strip [id number] Yes CHECK BLOOD SUGAR TWICE DAILY Joaquim Nam, MD Taking Active Spouse/Significant Other  metFORMIN (GLUCOPHAGE) 500 MG tablet [id number] Yes TAKE 2 TABS IN MORNING AND 1 TAB EVENING Joaquim Nam, MD Taking Active   oxyCODONE (ROXICODONE) 5 MG immediate release tablet [id number] Yes Take 1 tablet (5 mg total) by mouth every 6 (six) hours as needed for severe pain. Joaquim Nam, MD Taking Active   promethazine-dextromethorphan (PROMETHAZINE-DM) 6.25-15 MG/5ML syrup [id number] No Take 5 mLs by mouth 4 (four) times daily as needed for cough.  Patient not taking: Reported on 11/20/2022   Joaquim Nam, MD Not Taking Active   rosuvastatin (CRESTOR) 40 MG tablet [id number] Yes Take 1 tablet (40 mg total) by mouth daily. Joaquim Nam, MD Taking Active   sacubitril-valsartan [facility]) 49-51 MG [id number] Yes Take 1 tablet by mouth 2 (two) times daily. Joaquim Nam, MD Taking Active  silver sulfADIAZINE (SILVADENE) 1 % cream [id number] No Apply 1 Application topically daily.  Patient not taking: Reported on 11/20/2022   Vivi Barrack, DPM Not Taking Active   spironolactone (ALDACTONE) 25 MG tablet [id number] Yes Take 1 tablet (25 mg total) by mouth daily. Joaquim Nam, MD Taking Active   tobramycin  (TOBREX) 0.3 % ophthalmic solution [id number] No Place 1 drop into both eyes See admin instructions. Instill 1 drop both eyes 3 times daily the day before eye injection every 6-8 weeks  Patient not taking: Reported on 11/20/2022   [provider] Not Taking Active Spouse/Significant Other            Home Care and Equipment/Supplies: Were Home Health Services Ordered?: Yes Name of Home Health Agency:: [facility] Has Agency set up a time to come to your home?: Yes First Home Health Visit Date: 11/20/22 Any new equipment or medical supplies ordered?: No  Functional Questionnaire: Do you need assistance with bathing/showering or dressing?: Yes Do you need assistance with meal preparation?: Yes Do you need assistance with eating?: No Do you have difficulty maintaining continence: No Do you need assistance with getting out of bed/getting out of a chair/moving?: Yes Do you have difficulty managing or taking your medications?: Yes (wife assists)  Follow up appointments reviewed: PCP Follow-up appointment confirmed?: Yes Date of PCP follow-up appointment?: 11/24/22 Follow-up Provider: Dr Para March [facility] Follow-up appointment confirmed?: Yes Date of Specialist follow-up appointment?: 11/22/22 Follow-Up Specialty Provider:: Dr Jon Billings- Do you need transportation to your follow-up appointment?: No Do you understand care options if your condition(s) worsen?: Yes-patient verbalized understanding    SIGNATURE  Woodfin Ganja LPN [facility] Nurse Health Advisor Direct Dial [phone]

## 2022-11-20 NOTE — Telephone Encounter (Signed)
Noted. Thanks.

## 2022-11-21 ENCOUNTER — Encounter: Payer: Self-pay | Admitting: Family Medicine

## 2022-11-21 ENCOUNTER — Telehealth: Payer: Self-pay | Admitting: Podiatry

## 2022-11-21 LAB — LAB REPORT - SCANNED: EGFR: 88

## 2022-11-21 NOTE — Telephone Encounter (Signed)
Lmom for patient to call back to schedule picking up his orthotics

## 2022-11-23 ENCOUNTER — Ambulatory Visit: Payer: BC Managed Care – PPO | Admitting: Podiatry

## 2022-11-23 ENCOUNTER — Telehealth: Payer: Self-pay | Admitting: Family Medicine

## 2022-11-23 ENCOUNTER — Other Ambulatory Visit: Payer: Self-pay

## 2022-11-23 NOTE — Telephone Encounter (Signed)
Please triage patient about pulse/pain/coreg use.  Thanks.

## 2022-11-23 NOTE — Telephone Encounter (Signed)
Dois Davenport from [facility] called to make PCP aware that pt pulse was 121  # [phone]

## 2022-11-24 ENCOUNTER — Encounter: Payer: Self-pay | Admitting: Family Medicine

## 2022-11-24 ENCOUNTER — Ambulatory Visit: Payer: BC Managed Care – PPO | Admitting: Family Medicine

## 2022-11-24 VITALS — BP 146/80 | HR 95 | Temp 97.1°F | Ht 70.0 in | Wt 320.0 lb

## 2022-11-24 DIAGNOSIS — M869 Osteomyelitis, unspecified: Secondary | ICD-10-CM

## 2022-11-24 DIAGNOSIS — D62 Acute posthemorrhagic anemia: Secondary | ICD-10-CM

## 2022-11-24 NOTE — Telephone Encounter (Signed)
Just saw that patient currently has appt today with Dr. Para March.

## 2022-11-24 NOTE — Telephone Encounter (Signed)
Forms have been printed; patient has appt today with Dr. Para March.

## 2022-11-24 NOTE — Patient Instructions (Addendum)
Please ask home health about the timeline to check vacomycin levels and follow up labs.   Take care.  Glad to see you. Try restarting iron as tolerated, as constipation allows.

## 2022-11-24 NOTE — Telephone Encounter (Signed)
Support pool please ignore; sending to triage.

## 2022-11-24 NOTE — Progress Notes (Unsigned)
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 *  Outpatient Provider Follow Up Issues: [ ]  Podiatry follow-up  Hospital Course: Austin Shaw is a 57 year old male with T2DM, PAD, CKD 2, HTN, HLP, obesity admitted to [facility] on 11/10/22 due to gangrene of toe of right foot and osteomyelitis. 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 On admission (11/10/22), xray of right foot demonstrated new findings concerning for osteomyelitis compared to xray images from 10/30/22. Blood and wound cultures were collected. ESR was elevated to >130, CRP elevated to 25; hgb 11.5, WBC and platelets within normal limits. Podiatry was consulted and recommended MRI of right foot, which demonstrated evidence of extensive osteomyelitis affecting the right great toe margin, 3-5 digit MTP and proximal phalangeal, and 5th mid phalanx. Wound culture grew group A strep. Infectious disease was consulted and the patient was started on broad-spectrum IV antibiotics. Vascular surgery was consulted to assess need for intervention given dry gangrene of right  toe. Initially, toe pressure 75 mmHg determined to be adequate for wound healing, but following multidisciplinary discussion with podiatry and to aid with surgical planning, the patient underwent a PVL arterial duplex study of the right foot. Podiatry recommended staged TMA of right foot, with first procedure done 11/15/22 with no significant intraoperative complications. Bone biopsy and cultures from OR with NGTD at time of discharge. Final antibiotic plan is IV vancomycin, ceftriaxone, and metronidazole for 6 weeks duration with PICC line placed on 11/16/22. Plan for second phase of TMA outpatient with podiatry.  PAD S/p angioplasty in 07/2020 Arterial duplex US 10/30/22 shows arterial obstruction involving SFA, popliteal artery, and TP vessels, with right ABIs ATA 0.68, PTA 0, 2nd toe 0.45 and right 2nd toe pressure 75 mmHg. Vascular surgery was consulted in the ED to assess the dry gangrene and felt the right toe pressure was adequate for wound healing. Overall, felt gangrenous changes were due to microvascular disease from the patient's diabetes. Significantly calcified vessels were noted during TMA procedure but with adequate blood flow. Home Plavix was held for surgical intervention and restarted post-operatively. Home statin was continued.  Chronic HFrEF  Mild CAD  HTN  HLD Follows with [facility]. TTE 02/2022 with LVEF 45%. Last [facility] 04/2022 with non-obstructive CAD (30% LCx-OM1 and ramus lesions). Euvolemic at time of admission, home carvedilol, spironolactone, and entresto were continued. Lasix was continued every other day.  T2DM HbA1c 7.7 in 10/2022. Home regimen is metformin, farxiga, trulicity 3 mg q7d. Home Trulicity and metformin were held. Inpatient glucose management via SSI ACHS.  CKD 2 Baseline Cr 1.1-1.3, monitored with BMP while on SGLTi, Lasix, Aldactone, Entresto.  Anemia Iron studies and ferritin were significant for low iron, TIBC, and iron saturation with normal  ferritin, consistent with anemia of chronic disease. Oral ferrous sulfate was given every 2 days.  Procedures: PICC line Trans-metatarsal amputation ______________________________________________________________________ Discharge Medications:  Your Medication List   STOP taking these medications  amoxicillin-clavulanate 875-125 mg per tablet Commonly known as: AUGMENTIN  doxycycline 100 MG capsule Commonly known as: VIBRAMYCIN     START taking these medications  cefTRIAXone 2 g in sodium chloride 0.9 % 0.9 % 100 mL IVPB Infuse 2 g into a venous catheter daily.  metroNIDAZOLE 500 MG tablet Commonly known as: FLAGYL Take 1 tablet (500 mg total) by mouth Three (3) times a day.  oxyCODONE 5 MG immediate release tablet Commonly known as: ROXICODONE Take 1-2 tablets (5-10 mg total) by mouth four (4) times a day as needed for pain [name] 30mg  (6 tablets) in a day). Post-op patient, continuing dose from hospitalization  vancomycin in 0.9 % sodium chloride 1.5 gram/500 mL Soln Infuse 500 mL (1,500 mg total) into a venous catheter daily.     CHANGE how you take these medications  acetaminophen 500 MG tablet Commonly known as: TYLENOL Take 2 tablets (1,000 mg total) by mouth Three (3) times a day for 7 days. What changed: when to take this     CONTINUE taking these medications  carvedilol 25 MG tablet Commonly known as: COREG Take 1 tablet (25 mg total) by mouth two (2) times a day.  clopidogrel 75 mg tablet Commonly known as: PLAVIX Take 1 tablet (75 mg total) by mouth daily.  FARXIGA 10 mg Tab tablet Generic drug: dapagliflozin propanediol Take 1 tablet (10 mg total) by mouth every morning.  ferrous sulfate 324 mg (65 mg iron) EC tablet Take 1 tablet (324 mg total) by mouth every other day.  FREESTYLE LIBRE 14 DAY SENSOR USE TO CHECK BLOOD SUGARS DAILY  furosemide 40 MG tablet Commonly known as: LASIX Take 1 tablet (40 mg total) by mouth. Every other  day  metFORMIN 500 MG tablet Commonly known as: GLUCOPHAGE Take 2 tablets in the morning and 1 tablet in the evening  polyethylene glycol 17 gram packet Commonly known as: MIRALAX Take 17 g by mouth daily as needed (constipation).  rosuvastatin 40 MG tablet Commonly known as: CRESTOR Take 1 tablet (40 mg total) by mouth daily.  sacubitril-valsartan 49-51 mg tablet Commonly known as: ENTRESTO Take 1 tablet by mouth two (2) times a day.  senna 8.6 mg tablet Commonly known as: SENOKOT Take 2 tablets by mouth two (2) times a day.  spironolactone 25 MG tablet Commonly known as: ALDACTONE Take 1 tablet (25 mg total) by mouth daily.  TRULICITY 1.5 mg/0.5 mL Pnij Generic drug: dulaglutide Inject 1 mL (3 mg total) under the skin every seven (7) days.  vitamin C 100 MG tablet Generic drug: ascorbic acid (vitamin C) Take 3 tablets (300 mg total) by mouth daily. Gummies  ZINC ACETATE ORAL Take 1 tablet by mouth daily.     Allergies: Atorvastatin ______________________________________________________________________ Pending Test Results (if blank, then none): Pending Labs  Order Current Status AFB culture In process AFB culture In process Surgical pathology exam In process Aerobic/Anaerobic Culture Preliminary result Aerobic/Anaerobic Culture Preliminary result Fungal (Mould) Pathogen Culture Preliminary result Fungal (Mould) Pathogen Culture Preliminary result    Most Recent Labs: All lab results last 24 hours - No results found for this or any previous visit (from the past 24 hour(s)). Microbiology - Microbiology Results (last day)  ** No results found for the last 24 hours. **    CBC -  No results found for requested labs within last 2 days.  BMP - Results in Past 2 Days Result Component Current Result Sodium 138 (11/18/2022) Potassium 3.9 (11/18/2022) Chloride 104 (11/18/2022) CO2 28.0 (11/18/2022) BUN 12 (11/18/2022) Creatinine 1.19 (H)  (11/18/2022) EST.GFR (MDRD) Not in Time Range Glucose 101 (11/18/2022)  Most recent vanc trough from 8/10 was 15.1, and resulted in adjustment of dosing.  Relevant Studies/Radiology (if blank, then none): XR Foot 3 Or More Views Right  Result Date: 11/17/2022 EXAM: XR FOOT 3 OR MORE VIEWS RIGHT DATE: 11/17/2022 9:27 AM ACCESSION: [id number] UN DICTATED: 11/17/2022 9:28 AM INTERPRETATION LOCATION: [facility] CLINICAL INDICATION: 57 years old Male with post op check COMPARISON: 11/16/2022 TECHNIQUE: Dorsoplantar, oblique and lateral views of the right foot. FINDINGS: Transmetatarsal amputations have been performed across all 5 rays as described on yesterday's radiographs. Soft tissue irregularity persists. There is no dissecting soft tissue emphysema or progressive osteolysis.  Unchanged appearances following transmetatarsal amputations of all 5 rays.  XR Abdomen 1 View  Result Date: 11/17/2022 EXAM: XR ABDOMEN 1 VIEW ACCESSION: [id number] UN CLINICAL INDICATION: 57 years old with INTESTINAL OBSTRUCTION COMPARISON: None TECHNIQUE: Supine view of the abdomen, 2 image(s) FINDINGS: Nondilated gaseous distention of small and large bowel. Moderate degenerative changes of the lower thoracic and lumbar spine. No acute osseous abnormality. Soft tissues are unremarkable. Cholecystectomy. Interstitial markings and air bronchograms within the left lower lobe could reflect chronic changes or infiltrate. Asymmetric elevation of the right hemidiaphragm incompletely assessed.  Nonobstructive bowel gas pattern. Asymmetric elevation of the right hemidiaphragm can be better evaluated with standard radiography.  XR Foot 3 Or More Views Right  Result Date: 11/16/2022 EXAM: XR FOOT 3 OR MORE VIEWS RIGHT DATE: 11/16/2022 9:37 AM ACCESSION: [id number] UN DICTATED: 11/16/2022 10:01 AM INTERPRETATION LOCATION: [facility] CLINICAL INDICATION: 57 years old Male with post op COMPARISON: Foot x-ray and MRI foot from 11/10/2022. TECHNIQUE:  Dorsoplantar, oblique and lateral views of the right foot. FINDINGS: Transmetatarsal amputations have been performed across all 5 rays, including revision of the previous transmetatarsal amputation of the first ray. There is no residual osteolysis to suggest ongoing infectious osteitis. No dissecting soft tissue emphysema.. Diffuse osteopenia.  Revision of the first transmetatarsal amputation with interval transmetatarsal amputations of the second through fifth digits. No soft tissue emphysema.  PVL Arterial Duplex Lower Extremity Right  Result Date: 11/14/2022 Peripheral Vascular Lab [address] [place], [place] [address] PVL ARTERIAL DUPLEX LOWER EXTREMITY RIGHT Patient Demographics Pt. Name: Austin Shaw Location: PVL Inpatient Lab MRN: [id number] Sex: M DOB: 11/11/1965 Age: 73 years Study Information Authorizing [id number] [facility] Performed Time 11/14/2022 4:14:52 Provider Name Nelva Nay PM Ordering Physician [facility] Patient Location [facility] Accession Number [id number] UN Technologist Eli Phillips RVT Diagnosis: Assisting Lauren Technologist Napolitano, RVT, RDMS Ordered Reason For Exam: R foot / pedial arterial duplex to eval flow for TMA Other Indication: Rt foot pain, s/p complicated Rt LE arterial revascularization at OSH. High Risk Factors: Hypertension, hyperlipidemia, and non insulin dependent diabetes mellitus. Vascular Interventions: S/p atherectomy and balloon angioplasty of Rt popliteal artery, Rt PTA. Final Interpretation Right Occluded PTA - Antegrade low resistance flow in the ATA and peroneal arteries - Limited study. Electronically signed by [id number] Jodell Cipro MD on 11/14/2022 at 6:31:02 PM. Examination Protocol: Duplex scanning was selectively utilized to scan segments of one or both extremities. Images were recorded to document color and PW spectral Doppler analysis as well as B-mode characteristics. Right Summary Comment: Limited study to evaluate pedal flow and pedal  acceleration times. See prior exam on 10/30/22 for full arterial duplex. The PTA appears occluded, unchanged from prior exam. Antegrade, low-resistant flow is noted in the ATA and peroneal artery. Dorsalis pedis artery = 33 cm/s Arcuate = 46 cm/s; 150 ms Deep Plantar = 31 cm/s; 117 ms First Dorsal = 25 cm/s; 117 ms Medial Plantar = 7 cm/s; 117 ms Lateral Plantar = 4 cm/s; 117 ms. Right Findings Peak Systolic Velocity 33 cm/s ATA 27 cm/s Pero A ** Final **  MRI Lower Extremity Non-Joint Right W Wo Contrast  Result Date: 11/11/2022 EXAM: MRI LOWER EXTREMITY NON-JOINT RIGHT W WO CONTRAST DATE: 11/11/2022 12:22 AM ACCESSION: [id number] UN DICTATED: 11/11/2022 12:25 AM INTERPRETATION LOCATION: MAIN CAMPUS CLINICAL INDICATION: 57 years old Male with Osteomyelitis COMPARISON: Right foot radiographs 11/10/2022 TECHNIQUE: MRI of the right forefoot and midfoot was performed without contrast using a local coil. Multisequence, multiplanar images were obtained. FINDINGS: Periosteal enhancement and edema of the great toe metatarsal resection margin (4:25, 11:7). There is low T1 signal of the distal metatarsal bone marrow. Bone marrow edema involving the third through fifth metatarsal head, third through fifth proximal phalangeal base, and fifth middle phalanx. Joint alignment and joint spaces are normal. Small joint effusion 3rd-5th MTP joints. Joint capsular structures are intact. The Lisfranc ligament is intact. Multifocal soft tissue edema of the forefoot. Flexor and extensor tendons are intact.  Multifocal osteomyelitis of the forefoot involving the great toe metatarsal at the resection margin, third through fifth metacarpal heads, third through fifth proximal phalangeal bases, and fifth middle phalanx. Adjacent soft tissue edema. Joint effusion is seen from 3rd-5th MTP joints which could relate to septic arthritis.  XR Foot 3 Or More Views Right  Result Date: 11/10/2022 EXAM: XR FOOT 3 OR MORE VIEWS RIGHT DATE: 11/10/2022  2:12 PM ACCESSION: [id number] UN DICTATED: 11/10/2022 2:13 PM INTERPRETATION LOCATION: Main Campus CLINICAL INDICATION: 57 years old Male with c/f osteomylitis 4th toe COMPARISON: Foot x-ray from 10/30/2022. TECHNIQUE: Dorsoplantar, oblique and lateral views of the right foot. FINDINGS: Redemonstrated first transmetatarsal amputation. Interval development of focal osteopenia and erosions of the third, fourth, and fifth ray proximal phalanx bases. There is also focal osteopenia involving the metatarsal heads and fifth toe fused middle-distal phalanx. Mild mild osteoarthrosis throughout the right foot. Diffuse small vessel calcifications in the right foot. Diffuse soft tissue swelling without tracking soft tissue gas.  Interval development of cortical erosions and lucency around the third through fifth ray proximal phalanx bases and metatarsal heads, and fused fifth ray middle-distal phalanx consistent with osteomyelitis. Redemonstrated first ray trans metatarsal amputation. _____________________________________________ Inpatient course discussed with patient.  Admitted for IV antibiotic treatment and surgery for right foot osteomyelitis with staged second procedure pending.  He has infectious disease and podiatry follow-up pending.  Antibiotics through PICC line.  PICC cautions discussed with patient. No pain sitting but discomfort with weight bearing.  In R post op shoe.   He is out of work in the meantime.  See scanned forms about that.  This episode started when he was exercising then got a blister on the R foot.  That got infected and led to inpatient tx.    Last A1c 7.7.    Rationale for current meds discussed with patient.  He has ongoing treatment for hypertension/diabetes/CHF.  He has home health pending for follow-up labs related to vancomycin, etc.  Meds, vitals, and allergies reviewed.   ROS: Per HPI unless specifically indicated in ROS section   Nad Ncat Neck supple, no  LA Rrr Ctab Abdomen soft.  Nontender. Extremities without edema. R foot bandaged.    Recent labs discussed with patient.  Hemoglobin 9.6 which is stable and expected given his postop status.  25 minutes were devoted to patient care in this encounter (this includes time spent reviewing the patient's file/history, interviewing and examining the patient, counseling/reviewing plan with patient).

## 2022-11-26 DIAGNOSIS — D62 Acute posthemorrhagic anemia: Secondary | ICD-10-CM | POA: Insufficient documentation

## 2022-11-26 DIAGNOSIS — K439 Ventral hernia without obstruction or gangrene: Secondary | ICD-10-CM | POA: Insufficient documentation

## 2022-11-26 DIAGNOSIS — M869 Osteomyelitis, unspecified: Secondary | ICD-10-CM | POA: Insufficient documentation

## 2022-11-26 NOTE — Assessment & Plan Note (Signed)
Continue ceftriaxone metronidazole and vancomycin per outside clinic.  I asked him to check with home health about the timeline to check vacomycin levels and follow up labs.  He is going going for his staged procedure.  Discussed.  Continue routine wound care in the meantime.  Last A1c was reasonable at 7.7, especially compared to prior labs.  No fevers and no reported purulent discharge, okay for outpatient follow-up.

## 2022-11-26 NOTE — Assessment & Plan Note (Signed)
He is going to have follow-up labs for outside clinic.  Discussed restarting iron as tolerated in the meantime.

## 2022-12-04 ENCOUNTER — Other Ambulatory Visit: Payer: Self-pay | Admitting: Family Medicine

## 2022-12-04 ENCOUNTER — Encounter: Payer: Self-pay | Admitting: Family Medicine

## 2022-12-04 LAB — COMPREHENSIVE METABOLIC PANEL
Albumin: 3.3 — AB (ref 3.5–5.0)
Calcium: 8.3 — AB (ref 8.7–10.7)
Globulin: 3.5
eGFR: 69

## 2022-12-04 LAB — HEPATIC FUNCTION PANEL
ALT: 12 U/L (ref 10–40)
AST: 24 (ref 14–40)
Alkaline Phosphatase: 60 (ref 25–125)

## 2022-12-04 LAB — CBC: RBC: 3.34 — AB (ref 3.87–5.11)

## 2022-12-04 LAB — CBC AND DIFFERENTIAL
HCT: 30 — AB (ref 41–53)
Hemoglobin: 9.6 — AB (ref 13.5–17.5)
Neutrophils Absolute: 4.5
Platelets: 329 10*3/uL (ref 150–400)
WBC: 7.4

## 2022-12-04 LAB — BASIC METABOLIC PANEL
BUN: 15 (ref 4–21)
CO2: 21 (ref 13–22)
Chloride: 100 (ref 99–108)
Creatinine: 1.2 (ref 0.6–1.3)
Glucose: 122
Potassium: 4.3 mEq/L (ref 3.5–5.1)
Sodium: 137 (ref 137–147)

## 2022-12-04 MED ORDER — METFORMIN HCL 500 MG PO TABS: ORAL_TABLET | ORAL | Status: DC

## 2022-12-05 LAB — LAB REPORT - SCANNED: EGFR: 69

## 2022-12-07 ENCOUNTER — Encounter: Payer: Self-pay | Admitting: Family Medicine

## 2022-12-18 LAB — HEPATIC FUNCTION PANEL
ALT: 15 U/L (ref 10–40)
AST: 18 (ref 14–40)
Alkaline Phosphatase: 72 (ref 25–125)

## 2022-12-18 LAB — CBC: RBC: 3.7 — AB (ref 3.87–5.11)

## 2022-12-18 LAB — CBC AND DIFFERENTIAL
HCT: 34 — AB (ref 41–53)
Hemoglobin: 10.7 — AB (ref 13.5–17.5)
Platelets: 282 10*3/uL (ref 150–400)
WBC: 8.1

## 2022-12-18 LAB — BASIC METABOLIC PANEL
Creatinine: 1.1 (ref 0.6–1.3)
Glucose: 178

## 2022-12-19 ENCOUNTER — Encounter: Payer: Self-pay | Admitting: Family Medicine

## 2022-12-19 LAB — LAB REPORT - SCANNED: EGFR: 79

## 2022-12-25 ENCOUNTER — Encounter: Payer: Self-pay | Admitting: Family Medicine

## 2022-12-26 ENCOUNTER — Encounter: Payer: Self-pay | Admitting: Family Medicine

## 2022-12-26 LAB — LAB REPORT - SCANNED: EGFR: 84

## 2022-12-27 ENCOUNTER — Other Ambulatory Visit: Payer: Self-pay | Admitting: Family Medicine

## 2022-12-27 ENCOUNTER — Other Ambulatory Visit: Payer: Self-pay

## 2022-12-28 ENCOUNTER — Other Ambulatory Visit: Payer: Self-pay | Admitting: Family Medicine

## 2022-12-28 ENCOUNTER — Other Ambulatory Visit: Payer: Self-pay

## 2022-12-28 MED FILL — Dulaglutide Soln Auto-injector 3 MG/0.5ML: SUBCUTANEOUS | 28 days supply | Qty: 2 | Fill #0 | Status: CN

## 2022-12-28 MED FILL — Dulaglutide Soln Auto-injector 3 MG/0.5ML: SUBCUTANEOUS | 56 days supply | Qty: 4 | Fill #0 | Status: CN

## 2022-12-29 ENCOUNTER — Encounter: Payer: Self-pay | Admitting: Family Medicine

## 2022-12-29 ENCOUNTER — Other Ambulatory Visit: Payer: Self-pay

## 2022-12-29 MED FILL — Dulaglutide Soln Auto-injector 3 MG/0.5ML: SUBCUTANEOUS | 28 days supply | Qty: 2 | Fill #0 | Status: CN

## 2023-01-01 ENCOUNTER — Telehealth: Payer: Self-pay

## 2023-01-01 ENCOUNTER — Other Ambulatory Visit (HOSPITAL_COMMUNITY): Payer: Self-pay

## 2023-01-01 NOTE — Telephone Encounter (Signed)
PA request has been Submitted. New Encounter created for follow up. For additional info see Pharmacy Prior Auth telephone encounter from 01-01-2023.

## 2023-01-01 NOTE — Telephone Encounter (Signed)
Pharmacy Patient Advocate Encounter   Received notification from Patient Advice Request messages that prior authorization for Trulicity 3MG /0.5ML pen-injectors is required/requested.   Insurance verification completed.   The patient is insured through [employer] [facility] .   Per test claim: PA required; PA submitted to [employer] [facility] via CoverMyMeds Key/confirmation #/EOC [id number] Status is pending

## 2023-01-02 ENCOUNTER — Other Ambulatory Visit: Payer: Self-pay

## 2023-01-02 MED FILL — Dulaglutide Soln Auto-injector 3 MG/0.5ML: SUBCUTANEOUS | 56 days supply | Qty: 4 | Fill #0 | Status: AC

## 2023-01-02 NOTE — Telephone Encounter (Signed)
Pharmacy Patient Advocate Encounter  Received notification from [employer] [facility] that Prior Authorization for Trulicity 3MG /0.5ML pen-injectors has been APPROVED from 01-01-2023 to 12-31-2025   PA #/Case ID/Reference #: [id number]

## 2023-01-03 ENCOUNTER — Ambulatory Visit: Payer: BC Managed Care – PPO | Admitting: Infectious Disease

## 2023-01-07 DIAGNOSIS — N182 Chronic kidney disease, stage 2 (mild): Secondary | ICD-10-CM

## 2023-01-07 DIAGNOSIS — E1151 Type 2 diabetes mellitus with diabetic peripheral angiopathy without gangrene: Secondary | ICD-10-CM | POA: Diagnosis not present

## 2023-01-07 DIAGNOSIS — E1122 Type 2 diabetes mellitus with diabetic chronic kidney disease: Secondary | ICD-10-CM

## 2023-01-07 DIAGNOSIS — Z9181 History of falling: Secondary | ICD-10-CM

## 2023-01-07 DIAGNOSIS — I2510 Atherosclerotic heart disease of native coronary artery without angina pectoris: Secondary | ICD-10-CM

## 2023-01-07 DIAGNOSIS — Z89431 Acquired absence of right foot: Secondary | ICD-10-CM

## 2023-01-07 DIAGNOSIS — Z7902 Long term (current) use of antithrombotics/antiplatelets: Secondary | ICD-10-CM

## 2023-01-07 DIAGNOSIS — I130 Hypertensive heart and chronic kidney disease with heart failure and stage 1 through stage 4 chronic kidney disease, or unspecified chronic kidney disease: Secondary | ICD-10-CM

## 2023-01-07 DIAGNOSIS — L0390 Cellulitis, unspecified: Secondary | ICD-10-CM | POA: Diagnosis not present

## 2023-01-07 DIAGNOSIS — I70209 Unspecified atherosclerosis of native arteries of extremities, unspecified extremity: Secondary | ICD-10-CM

## 2023-01-07 DIAGNOSIS — Z7984 Long term (current) use of oral hypoglycemic drugs: Secondary | ICD-10-CM

## 2023-01-07 DIAGNOSIS — Z4781 Encounter for orthopedic aftercare following surgical amputation: Secondary | ICD-10-CM | POA: Diagnosis not present

## 2023-01-07 DIAGNOSIS — Z955 Presence of coronary angioplasty implant and graft: Secondary | ICD-10-CM

## 2023-01-07 DIAGNOSIS — I890 Lymphedema, not elsewhere classified: Secondary | ICD-10-CM

## 2023-01-07 DIAGNOSIS — I5022 Chronic systolic (congestive) heart failure: Secondary | ICD-10-CM

## 2023-01-07 DIAGNOSIS — L89893 Pressure ulcer of other site, stage 3: Secondary | ICD-10-CM | POA: Diagnosis not present

## 2023-01-07 DIAGNOSIS — Z8614 Personal history of Methicillin resistant Staphylococcus aureus infection: Secondary | ICD-10-CM

## 2023-01-07 DIAGNOSIS — D631 Anemia in chronic kidney disease: Secondary | ICD-10-CM

## 2023-01-07 DIAGNOSIS — Z7985 Long-term (current) use of injectable non-insulin antidiabetic drugs: Secondary | ICD-10-CM

## 2023-02-21 ENCOUNTER — Other Ambulatory Visit: Payer: Self-pay | Admitting: Family Medicine

## 2023-02-22 MED FILL — Dulaglutide Soln Auto-injector 3 MG/0.5ML: SUBCUTANEOUS | 56 days supply | Qty: 4 | Fill #1 | Status: AC

## 2023-02-25 ENCOUNTER — Other Ambulatory Visit: Payer: Self-pay | Admitting: Family Medicine

## 2023-04-01 ENCOUNTER — Other Ambulatory Visit: Payer: Self-pay | Admitting: Family Medicine

## 2023-04-03 NOTE — Telephone Encounter (Signed)
Sent. Thanks.   

## 2023-04-06 ENCOUNTER — Encounter (INDEPENDENT_AMBULATORY_CARE_PROVIDER_SITE_OTHER): Payer: Self-pay

## 2023-04-10 ENCOUNTER — Other Ambulatory Visit: Payer: Self-pay | Admitting: Family Medicine

## 2023-04-19 MED FILL — Dulaglutide Soln Auto-injector 3 MG/0.5ML: SUBCUTANEOUS | 56 days supply | Qty: 4 | Fill #2 | Status: AC

## 2023-05-06 ENCOUNTER — Other Ambulatory Visit: Payer: Self-pay | Admitting: Cardiovascular Disease

## 2023-05-06 ENCOUNTER — Other Ambulatory Visit: Payer: Self-pay | Admitting: Family Medicine

## 2023-05-07 MED ORDER — CLOPIDOGREL BISULFATE 75 MG PO TABS: 75.0000 mg | ORAL_TABLET | Freq: Every day | ORAL | 0 refills | Status: AC

## 2023-05-07 MED ORDER — CARVEDILOL 25 MG PO TABS: 25.0000 mg | ORAL_TABLET | Freq: Two times a day (BID) | ORAL | 0 refills | Status: DC

## 2023-05-14 ENCOUNTER — Ambulatory Visit (INDEPENDENT_AMBULATORY_CARE_PROVIDER_SITE_OTHER): Payer: 59 | Admitting: Family Medicine

## 2023-05-14 ENCOUNTER — Encounter: Payer: Self-pay | Admitting: Family Medicine

## 2023-05-14 ENCOUNTER — Other Ambulatory Visit: Payer: Self-pay | Admitting: Cardiovascular Disease

## 2023-05-14 ENCOUNTER — Other Ambulatory Visit: Payer: Self-pay | Admitting: Family Medicine

## 2023-05-14 VITALS — BP 158/82 | HR 81 | Temp 98.6°F | Ht 68.5 in | Wt 329.0 lb

## 2023-05-14 DIAGNOSIS — Z7985 Long-term (current) use of injectable non-insulin antidiabetic drugs: Secondary | ICD-10-CM

## 2023-05-14 DIAGNOSIS — Z125 Encounter for screening for malignant neoplasm of prostate: Secondary | ICD-10-CM | POA: Diagnosis not present

## 2023-05-14 DIAGNOSIS — Z7984 Long term (current) use of oral hypoglycemic drugs: Secondary | ICD-10-CM | POA: Diagnosis not present

## 2023-05-14 DIAGNOSIS — E1169 Type 2 diabetes mellitus with other specified complication: Secondary | ICD-10-CM

## 2023-05-14 DIAGNOSIS — I10 Essential (primary) hypertension: Secondary | ICD-10-CM

## 2023-05-14 DIAGNOSIS — E782 Mixed hyperlipidemia: Secondary | ICD-10-CM

## 2023-05-14 DIAGNOSIS — Z0000 Encounter for general adult medical examination without abnormal findings: Secondary | ICD-10-CM

## 2023-05-14 DIAGNOSIS — Z7189 Other specified counseling: Secondary | ICD-10-CM

## 2023-05-14 MED ORDER — METFORMIN HCL 500 MG PO TABS: ORAL_TABLET | ORAL | Status: DC

## 2023-05-14 NOTE — Progress Notes (Unsigned)
CPE- See plan.  Routine anticipatory guidance given to patient.  See health maintenance.  The possibility exists that previously documented standard health maintenance information may have been brought forward from a previous encounter into this note.  If needed, that same information has been updated to reflect the current situation based on today's encounter.    Tetanus 2016 Flu 01/2023.  [name]   PNA 2016 Shingles d/w pt.   Covid prev done.   Colonoscopy 2019 PSA pending.  Living will d/w pt.  Wife designated if patient were incapacitated.   Diet and exercise d/w pt.    Diabetes:  Using medications without difficulties: yes Hypoglycemic episodes:no Hyperglycemic episodes:no Feet problems: see exam.  Blood Sugars averaging: ~120-150 eye exam within last year: yes Labs pending.   Elevated Cholesterol: Using medications without problems: yes Muscle aches: no Diet compliance: yes Exercise: limited by R foot.   Hypertension:    Using medication without problems or lightheadedness: yes Chest pain with exertion:no Edema:no Short of breath:no  PMH and SH reviewed  Meds, vitals, and allergies reviewed.   ROS: Per HPI.  Unless specifically indicated otherwise in HPI, the patient denies:  General: fever. Eyes: acute vision changes ENT: sore throat Cardiovascular: chest pain Respiratory: SOB GI: vomiting GU: dysuria Musculoskeletal: acute back pain Derm: acute rash Neuro: acute motor dysfunction Psych: worsening mood Endocrine: polydipsia Heme: bleeding Allergy: hayfever  GEN: nad, alert and oriented HEENT: mucous membranes moist NECK: supple w/o LA CV: rrr. PULM: ctab, no inc wob ABD: soft, +bs EXT: no edema SKIN: well perfused.   R foot in boot.

## 2023-05-14 NOTE — Patient Instructions (Signed)
Let me know when you need refills for the meds that I am managing.   We'll update you about your labs.  Take care.  Glad to see you.

## 2023-05-15 LAB — CBC WITH DIFFERENTIAL/PLATELET
Basophils Absolute: 0.2 10*3/uL — ABNORMAL HIGH (ref 0.0–0.1)
Basophils Relative: 2 % (ref 0.0–3.0)
Eosinophils Absolute: 0.4 10*3/uL (ref 0.0–0.7)
Eosinophils Relative: 4.7 % (ref 0.0–5.0)
HCT: 38.6 % — ABNORMAL LOW (ref 39.0–52.0)
Hemoglobin: 12.4 g/dL — ABNORMAL LOW (ref 13.0–17.0)
Lymphocytes Relative: 33.9 % (ref 12.0–46.0)
Lymphs Abs: 2.7 10*3/uL (ref 0.7–4.0)
MCHC: 32.2 g/dL (ref 30.0–36.0)
MCV: 91.1 fL (ref 78.0–100.0)
Monocytes Absolute: 0.6 10*3/uL (ref 0.1–1.0)
Monocytes Relative: 7.4 % (ref 3.0–12.0)
Neutro Abs: 4.1 10*3/uL (ref 1.4–7.7)
Neutrophils Relative %: 52 % (ref 43.0–77.0)
Platelets: 224 10*3/uL (ref 150.0–400.0)
RBC: 4.24 Mil/uL (ref 4.22–5.81)
RDW: 15.8 % — ABNORMAL HIGH (ref 11.5–15.5)
WBC: 7.9 10*3/uL (ref 4.0–10.5)

## 2023-05-15 LAB — HEMOGLOBIN A1C: Hgb A1c MFr Bld: 8.4 % — ABNORMAL HIGH (ref 4.6–6.5)

## 2023-05-15 LAB — PSA: PSA: 1.52 ng/mL (ref 0.10–4.00)

## 2023-05-15 MED ORDER — CARVEDILOL 25 MG PO TABS: 25.0000 mg | ORAL_TABLET | Freq: Two times a day (BID) | ORAL | 0 refills | Status: AC

## 2023-05-16 NOTE — Assessment & Plan Note (Signed)
 Continue coreg , entresto , with prn lasix . Continue spironolactone .

## 2023-05-16 NOTE — Assessment & Plan Note (Addendum)
 Living will d/w pt.  Wife designated if patient were incapacitated.   ?

## 2023-05-16 NOTE — Assessment & Plan Note (Signed)
 Continue crestor

## 2023-05-16 NOTE — Assessment & Plan Note (Addendum)
 Tetanus 2016 Flu 01/2023.  PNA 2016 Shingles d/w pt.   Covid prev done.   Colonoscopy 2019 PSA pending 2025  Living will d/w pt.  Wife designated if patient were incapacitated.   Diet and exercise d/w pt.

## 2023-05-16 NOTE — Assessment & Plan Note (Signed)
 Continue metformin  trulicity  farxiga . D/w pt about diet.  See notes on labs.

## 2023-05-17 ENCOUNTER — Encounter: Payer: Self-pay | Admitting: Family Medicine

## 2023-06-10 ENCOUNTER — Other Ambulatory Visit: Payer: Self-pay | Admitting: Family Medicine

## 2023-06-15 MED FILL — Dulaglutide Soln Auto-injector 3 MG/0.5ML: SUBCUTANEOUS | 56 days supply | Qty: 4 | Fill #3 | Status: AC

## 2023-06-26 ENCOUNTER — Encounter: Payer: Self-pay | Admitting: Family Medicine

## 2023-06-27 NOTE — Telephone Encounter (Signed)
 Patient scheduled appt through mychart/.

## 2023-07-02 ENCOUNTER — Ambulatory Visit: Admitting: Family Medicine

## 2023-07-02 ENCOUNTER — Encounter: Payer: Self-pay | Admitting: Family Medicine

## 2023-07-02 VITALS — BP 148/82 | HR 90 | Temp 98.7°F | Ht 68.5 in | Wt 332.4 lb

## 2023-07-02 DIAGNOSIS — E1169 Type 2 diabetes mellitus with other specified complication: Secondary | ICD-10-CM | POA: Diagnosis not present

## 2023-07-02 DIAGNOSIS — Z7984 Long term (current) use of oral hypoglycemic drugs: Secondary | ICD-10-CM | POA: Diagnosis not present

## 2023-07-02 NOTE — Progress Notes (Unsigned)
 Diabetes:  Using medications without difficulties: yes Hypoglycemic episodes:no Hyperglycemic episodes:no Feet problems: see below.  Blood Sugars averaging: 130-145  Previous microalbumin results discussed with patient.  The plan would still be to continue [place].  Discussed.  Discussed foot exam and foot wear.  He needs diabetic shoes and inserts.  See scanned form.  See foot exam below.  He has a history of peripheral neuropathy, cardiovascular disease, nephropathy, retinopathy, peripheral vascular disease.  History of transmetatarsal amputation on the right foot on 11/15/2022.  Meds, vitals, and allergies reviewed.   ROS: Per HPI unless specifically indicated in ROS section   GEN: nad, alert and oriented HEENT: ncat NECK: supple w/o LA CV: rrr. PULM: ctab, no inc wob ABD: soft, +bs EXT: no edema SKIN: well perfused.   Diabetic foot exam: Right foot with transmetatarsal amputation that appears to be healed.  He has decreased monofilament testing on the left foot.  Nails that are present on the left foot appear normal.  He has palpable dorsalis pedis and posterior tibial pulses bilaterally.  No ulceration or callus formation on either foot now.

## 2023-07-02 NOTE — Patient Instructions (Addendum)
 I'll work on the form and we'll submit them.  We'll go from there.  Take care.  Glad to see you. Recheck in May or June, sooner if needed.

## 2023-07-03 LAB — HM DIABETES EYE EXAM

## 2023-07-04 NOTE — Assessment & Plan Note (Signed)
 See scanned notes and orders for diabetic shoes and inserts.  History of transmetatarsal amputation but the skin appears to be healed.  Routine footcare cautions discussed with patient.  Continue metformin and Trulicity [place].  Recheck periodically.

## 2023-08-30 ENCOUNTER — Other Ambulatory Visit: Payer: Self-pay | Admitting: Family Medicine

## 2023-08-30 ENCOUNTER — Other Ambulatory Visit: Payer: Self-pay

## 2023-08-31 ENCOUNTER — Other Ambulatory Visit: Payer: Self-pay

## 2023-08-31 ENCOUNTER — Other Ambulatory Visit: Payer: Self-pay | Admitting: Family Medicine

## 2023-09-04 ENCOUNTER — Other Ambulatory Visit: Payer: Self-pay

## 2023-09-05 ENCOUNTER — Other Ambulatory Visit: Payer: Self-pay

## 2023-09-05 ENCOUNTER — Encounter: Payer: Self-pay | Admitting: Family Medicine

## 2023-09-05 MED ORDER — TRULICITY 3 MG/0.5ML ~~LOC~~ SOAJ: 3.0000 mg | SUBCUTANEOUS | 3 refills | Status: DC

## 2023-09-07 ENCOUNTER — Encounter: Payer: Self-pay | Admitting: Family Medicine

## 2023-09-07 ENCOUNTER — Other Ambulatory Visit: Payer: Self-pay

## 2023-09-07 ENCOUNTER — Ambulatory Visit: Admitting: Family Medicine

## 2023-09-07 VITALS — BP 134/68 | HR 74 | Temp 98.4°F | Ht 68.5 in | Wt 339.2 lb

## 2023-09-07 DIAGNOSIS — E1169 Type 2 diabetes mellitus with other specified complication: Secondary | ICD-10-CM | POA: Diagnosis not present

## 2023-09-07 LAB — POCT GLYCOSYLATED HEMOGLOBIN (HGB A1C): Hemoglobin A1C: 8.6 % — AB (ref 4.0–5.6)

## 2023-09-07 MED ORDER — FREESTYLE LIBRE 3 PLUS SENSOR MISC: 3 refills | Status: AC

## 2023-09-07 MED ORDER — TRULICITY 3 MG/0.5ML ~~LOC~~ SOAJ
3.0000 mg | SUBCUTANEOUS | 3 refills | Status: DC
  Filled 2023-09-07: qty 2, 28d supply, fill #0
  Filled 2023-10-09: qty 2, 28d supply, fill #1
  Filled 2023-10-10: qty 4, 56d supply, fill #1

## 2023-09-07 NOTE — Patient Instructions (Addendum)
 See how the new sensor goes.  Update me about your sugars in about 2-3 weeks.  We can make plans at that point.   Please update me about the eye clinic so we can request the most recent notes.   Take care.  Glad to see you.  Recheck in about 3-4 months.  A1c at the visit.

## 2023-09-07 NOTE — Progress Notes (Signed)
 Diabetes:  Using medications without difficulties: yes Hypoglycemic episodes: no Hyperglycemic episodes: no Feet problems: no changes from prior.  He got his orthotic.   Blood Sugars averaging: Not able to check at home recently, had meter malfunction. Wife is going to check on replacement.   eye exam within last year: yes He has been out of trulicity  for about 2 weeks.  He got refill today.    Diet and exercise d/w pt.    He needs new rx for libre 3+.  Sent today.    A1c 8.6.  This is in spite of him being off Trulicity  recently.  Discussed.  Meds, vitals, and allergies reviewed.   ROS: Per HPI unless specifically indicated in ROS section   GEN: nad, alert and oriented HEENT: mucous membranes moist NECK: supple w/o LA CV: rrr. PULM: ctab, no inc wob ABD: soft, +bs EXT: trace BLE edema SKIN: no acute rash

## 2023-09-08 NOTE — Assessment & Plan Note (Signed)
 He has been out of trulicity  for about 2 weeks.  He got refill today.   Diet and exercise d/w pt.   He needs new rx for libre 3+.  Sent today.   A1c 8.6.  This is in spite of him being off Trulicity  recently.  Discussed. I think the best option is to restart Trulicity  as is, have him check his sugar on his meter at home in the meantime and see what he can do with diet and exercise without changing his medicines otherwise.  We can recheck his labs later in the year here in clinic.  He agrees with plan.

## 2023-09-09 ENCOUNTER — Encounter: Payer: Self-pay | Admitting: Family Medicine

## 2023-09-12 NOTE — Telephone Encounter (Signed)
 Please see the photograph sent by the patient and request his eye exam report.  Thanks.

## 2023-09-13 ENCOUNTER — Encounter: Payer: Self-pay | Admitting: Family Medicine

## 2023-10-10 ENCOUNTER — Other Ambulatory Visit: Payer: Self-pay

## 2023-11-06 ENCOUNTER — Ambulatory Visit: Admitting: Family Medicine

## 2023-11-06 VITALS — BP 130/80 | HR 84 | Temp 98.7°F | Ht 68.5 in | Wt 327.6 lb

## 2023-11-06 DIAGNOSIS — R8290 Unspecified abnormal findings in urine: Secondary | ICD-10-CM | POA: Diagnosis not present

## 2023-11-06 DIAGNOSIS — R197 Diarrhea, unspecified: Secondary | ICD-10-CM

## 2023-11-06 NOTE — Progress Notes (Unsigned)
 Sx over the last 2 weeks.  Dec in appetite.  Then more recently had constipation then diarrhea.  No vomiting.  On metformin  and trulicity .  On farxiga .  Urine odor is different. Not burning with urination.  Sugar 100-160 today.  No abd pain.   Next dose of trulicity  is due tomorrow.    Meds, vitals, and allergies reviewed.   ROS: Per HPI unless specifically indicated in ROS section   GEN: nad, alert and oriented HEENT: mucous membranes moist NECK: supple w/o LA CV: rrr. PULM: ctab, no inc wob ABD: soft, +bs, nontender to palpation. EXT: no edema SKIN: Well-perfused.

## 2023-11-06 NOTE — Patient Instructions (Addendum)
 Go to the lab on the way out.   If you have mychart we'll likely use that to update you.    Take care.  Glad to see you. I would skip trulicity  tomorrow.  Update me about your symptoms next week.

## 2023-11-07 ENCOUNTER — Ambulatory Visit: Payer: Self-pay | Admitting: Family Medicine

## 2023-11-07 DIAGNOSIS — E1169 Type 2 diabetes mellitus with other specified complication: Secondary | ICD-10-CM

## 2023-11-07 DIAGNOSIS — R197 Diarrhea, unspecified: Secondary | ICD-10-CM | POA: Insufficient documentation

## 2023-11-07 DIAGNOSIS — R8290 Unspecified abnormal findings in urine: Secondary | ICD-10-CM | POA: Insufficient documentation

## 2023-11-07 LAB — URINALYSIS, ROUTINE W REFLEX MICROSCOPIC
Bilirubin Urine: NEGATIVE
Ketones, ur: NEGATIVE
Leukocytes,Ua: NEGATIVE
Nitrite: NEGATIVE
Specific Gravity, Urine: 1.02 (ref 1.000–1.030)
Total Protein, Urine: 100 — AB
Urine Glucose: >=1000 — AB
Urobilinogen, UA: 0.2 (ref 0.0–1.0)
pH: 6 (ref 5.0–8.0)

## 2023-11-07 LAB — COMPREHENSIVE METABOLIC PANEL WITH GFR
ALT: 18 U/L (ref 0–53)
AST: 25 U/L (ref 0–37)
Albumin: 4.1 g/dL (ref 3.5–5.2)
Alkaline Phosphatase: 53 U/L (ref 39–117)
BUN: 23 mg/dL (ref 6–23)
CO2: 26 meq/L (ref 19–32)
Calcium: 9.4 mg/dL (ref 8.4–10.5)
Chloride: 101 meq/L (ref 96–112)
Creatinine, Ser: 2.01 mg/dL — ABNORMAL HIGH (ref 0.40–1.50)
GFR: 36.41 mL/min — ABNORMAL LOW (ref >60.00)
Glucose, Bld: 107 mg/dL — ABNORMAL HIGH (ref 70–99)
Potassium: 4.7 meq/L (ref 3.5–5.1)
Sodium: 136 meq/L (ref 135–145)
Total Bilirubin: 0.5 mg/dL (ref 0.2–1.2)
Total Protein: 7.6 g/dL (ref 6.0–8.3)

## 2023-11-07 LAB — CBC WITH DIFFERENTIAL/PLATELET
Basophils Absolute: 0.1 K/uL (ref 0.0–0.1)
Basophils Relative: 1.3 % (ref 0.0–3.0)
Eosinophils Absolute: 0.3 K/uL (ref 0.0–0.7)
Eosinophils Relative: 5.4 % — ABNORMAL HIGH (ref 0.0–5.0)
HCT: 38.8 % — ABNORMAL LOW (ref 39.0–52.0)
Hemoglobin: 12.7 g/dL — ABNORMAL LOW (ref 13.0–17.0)
Lymphocytes Relative: 36.3 % (ref 12.0–46.0)
Lymphs Abs: 2.2 K/uL (ref 0.7–4.0)
MCHC: 32.7 g/dL (ref 30.0–36.0)
MCV: 92.2 fl (ref 78.0–100.0)
Monocytes Absolute: 0.5 K/uL (ref 0.1–1.0)
Monocytes Relative: 8.4 % (ref 3.0–12.0)
Neutro Abs: 2.9 K/uL (ref 1.4–7.7)
Neutrophils Relative %: 48.6 % (ref 43.0–77.0)
Platelets: 203 K/uL (ref 150.0–400.0)
RBC: 4.21 Mil/uL — ABNORMAL LOW (ref 4.22–5.81)
RDW: 14.5 % (ref 11.5–15.5)
WBC: 6.1 K/uL (ref 4.0–10.5)

## 2023-11-07 NOTE — Assessment & Plan Note (Signed)
 With benign abdominal exam.  Discussed the potential increased risk of UTIs with taking Farxiga .  See notes on labs.  At this point I do not suspect a UTI.

## 2023-11-07 NOTE — Assessment & Plan Note (Signed)
 Unclear how much of his symptoms are related to metformin , Trulicity , or the combination of both.  He could also have symptoms from another process like an infectious diarrhea.  Discussed.  I suspect that Trulicity  could contribute to his symptoms.  Discussed. I would skip trulicity  tomorrow.  I asked him to update me about his symptoms next week.  At this point okay for outpatient follow-up.  See notes on labs.

## 2023-11-11 ENCOUNTER — Other Ambulatory Visit: Payer: Self-pay | Admitting: Family Medicine

## 2023-11-11 MED ORDER — TRULICITY 3 MG/0.5ML ~~LOC~~ SOAJ: 3.0000 mg | SUBCUTANEOUS | Status: DC

## 2023-11-11 NOTE — Telephone Encounter (Signed)
 Noted. Thanks.

## 2023-11-14 ENCOUNTER — Other Ambulatory Visit (INDEPENDENT_AMBULATORY_CARE_PROVIDER_SITE_OTHER)

## 2023-11-14 DIAGNOSIS — E1169 Type 2 diabetes mellitus with other specified complication: Secondary | ICD-10-CM | POA: Diagnosis not present

## 2023-11-15 LAB — BASIC METABOLIC PANEL WITH GFR
BUN: 27 mg/dL — ABNORMAL HIGH (ref 6–23)
CO2: 22 meq/L (ref 19–32)
Calcium: 8.9 mg/dL (ref 8.4–10.5)
Chloride: 102 meq/L (ref 96–112)
Creatinine, Ser: 1.98 mg/dL — ABNORMAL HIGH (ref 0.40–1.50)
GFR: 37.07 mL/min — ABNORMAL LOW (ref >60.00)
Glucose, Bld: 221 mg/dL — ABNORMAL HIGH (ref 70–99)
Potassium: 4.4 meq/L (ref 3.5–5.1)
Sodium: 139 meq/L (ref 135–145)

## 2023-11-18 ENCOUNTER — Ambulatory Visit: Payer: Self-pay | Admitting: Family Medicine

## 2023-11-18 ENCOUNTER — Other Ambulatory Visit: Payer: Self-pay | Admitting: Family Medicine

## 2023-11-18 DIAGNOSIS — R7989 Other specified abnormal findings of blood chemistry: Secondary | ICD-10-CM

## 2023-11-18 MED ORDER — METFORMIN HCL 500 MG PO TABS: ORAL_TABLET | ORAL | Status: AC

## 2023-11-29 ENCOUNTER — Encounter: Payer: Self-pay | Admitting: *Deleted

## 2023-12-02 ENCOUNTER — Other Ambulatory Visit: Payer: Self-pay | Admitting: Family Medicine

## 2023-12-02 MED ORDER — TRULICITY 1.5 MG/0.5ML ~~LOC~~ SOAJ
1.5000 mg | SUBCUTANEOUS | 5 refills | Status: DC
  Filled 2023-12-02: qty 2, 28d supply, fill #0

## 2023-12-03 ENCOUNTER — Other Ambulatory Visit: Payer: Self-pay

## 2024-01-03 ENCOUNTER — Telehealth: Payer: Self-pay

## 2024-01-03 NOTE — Telephone Encounter (Signed)
 This is an Financial planner

## 2024-01-03 NOTE — Telephone Encounter (Signed)
 Copied from CRM #[id number]. Topic: General - Other >> Jan 03, 2024  9:37 AM Dedra B wrote: Reason for CRM: Gerri, case management nurse from [facility], called to let Dr. Cleatus know that pt ahs been identified for their case management program but they have been unable to contact him. Lacasha can be reached at [phone]

## 2024-01-03 NOTE — Telephone Encounter (Signed)
 I have nothing to add here.

## 2024-01-06 ENCOUNTER — Other Ambulatory Visit: Payer: Self-pay | Admitting: Family Medicine

## 2024-01-08 ENCOUNTER — Ambulatory Visit: Admitting: Family Medicine

## 2024-01-08 ENCOUNTER — Encounter: Payer: Self-pay | Admitting: Family Medicine

## 2024-01-08 VITALS — BP 138/82 | HR 70 | Temp 98.9°F | Ht 68.5 in | Wt 322.6 lb

## 2024-01-08 DIAGNOSIS — E1169 Type 2 diabetes mellitus with other specified complication: Secondary | ICD-10-CM

## 2024-01-08 DIAGNOSIS — Z7984 Long term (current) use of oral hypoglycemic drugs: Secondary | ICD-10-CM | POA: Diagnosis not present

## 2024-01-08 LAB — POCT GLYCOSYLATED HEMOGLOBIN (HGB A1C): Hemoglobin A1C: 7.8 % — AB (ref 4.0–5.6)

## 2024-01-08 NOTE — Patient Instructions (Addendum)
 Go to the lab on the way out.   If you have mychart we'll likely use that to update you.    I would get the flu shot after the next two visits with cardiology and the kidney clinic.  Take care.  Glad to see you.

## 2024-01-08 NOTE — Progress Notes (Unsigned)
 Diabetes:  Using medications without difficulties: yes Hypoglycemic episodes:no Hyperglycemic episodes:no Feet problems: no new changes.   Blood Sugars averaging: usually ~140s.   D/w pt about renal eval.  CKD stage G3a A3 Cr checked today is stable at 1.47 (EGFR 56), but has significant albuminuria (UACR 793 micrograms/mg), UPC 2 g/g. Urine sediment shows monomorphic rbc's (no casts)- can be seen in diabetic nephropathy. ANA, C3, C4 are normal. 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 I recommend renal ultrasound and kidney biopsy. Would need to hold plavix . Suspect that he has diabetic nephropathy, but an MGRS should be investigated. Unable to reach him or wife on the phone to discuss. I left voicemail and mychart message with request for call back.  - ACE/ARB: continue entresto  97-103 - MRA: prescribed spiro 50, recommend increasing dose while monitoring serum K given degree of albuminuria. Unable to reach patient after visit to discuss.  - Sglt2i: continue farxiga  10.  - glp1a: did not tolerate trulicity  due to GI side effects, agree with starting mounjaro  - plavix /statin: continue crestor  40 mg daily. LDL 62 (04/2023)- goal <55  - importance of achieving lower body weight with diet and exercise discussed.  - already avoiding NSAIDs   Hydralazine  50mg  BID.   Had u/s done.  Renal biopsy pending.    PMH and SH reviewed  Meds, vitals, and allergies reviewed.   ROS: Per HPI unless specifically indicated in ROS section   GEN: nad, alert and oriented HEENT: mucous membranes moist NECK: supple w/o LA CV: rrr. PULM: ctab, no inc wob ABD: soft, +bs EXT: trace BLE edema SKIN: well perfused.

## 2024-01-09 LAB — BASIC METABOLIC PANEL WITH GFR
BUN: 25 mg/dL — ABNORMAL HIGH (ref 6–23)
CO2: 27 meq/L (ref 19–32)
Calcium: 9.3 mg/dL (ref 8.4–10.5)
Chloride: 102 meq/L (ref 96–112)
Creatinine, Ser: 1.82 mg/dL — ABNORMAL HIGH (ref 0.40–1.50)
GFR: 40.97 mL/min — ABNORMAL LOW (ref >60.00)
Glucose, Bld: 120 mg/dL — ABNORMAL HIGH (ref 70–99)
Potassium: 4.4 meq/L (ref 3.5–5.1)
Sodium: 137 meq/L (ref 135–145)

## 2024-01-10 ENCOUNTER — Ambulatory Visit: Payer: Self-pay | Admitting: Family Medicine

## 2024-01-10 NOTE — Assessment & Plan Note (Signed)
 With history of elevated creatinine/kidney disease.  Diagnosed with MGUS at renal clinic.  MGUS diagnosis and rationale for renal biopsy discussed with patient.  He wanted to recheck his creatinine today.  That is reasonable.  A1c is not all the way to goal but it is improved from prior.  I think it makes sense not to change his medications for now.  Continue Farxiga  metformin .  Continue work on diet.  See notes on labs.  He can get routine vaccines after he has cardiology and renal clinic follow-up.  We can recheck here periodically.

## 2024-03-27 LAB — OPHTHALMOLOGY REPORT-SCANNED
